# Patient Record
Sex: Female | Born: 1935 | Race: Black or African American | Hispanic: No | Smoking: Never smoker
Health system: Southern US, Community
[De-identification: ages and names within clinical notes are randomized; demographics above are authoritative.]

## PROBLEM LIST (undated history)

## (undated) DIAGNOSIS — K5792 Diverticulitis of intestine, part unspecified, without perforation or abscess without bleeding: Secondary | ICD-10-CM

## (undated) DIAGNOSIS — I7300 Raynaud's syndrome without gangrene: Secondary | ICD-10-CM

## (undated) DIAGNOSIS — R0902 Hypoxemia: Secondary | ICD-10-CM

## (undated) DIAGNOSIS — Z9981 Dependence on supplemental oxygen: Secondary | ICD-10-CM

## (undated) DIAGNOSIS — M40209 Unspecified kyphosis, site unspecified: Secondary | ICD-10-CM

## (undated) DIAGNOSIS — D649 Anemia, unspecified: Secondary | ICD-10-CM

## (undated) DIAGNOSIS — E785 Hyperlipidemia, unspecified: Secondary | ICD-10-CM

## (undated) DIAGNOSIS — M1990 Unspecified osteoarthritis, unspecified site: Secondary | ICD-10-CM

## (undated) DIAGNOSIS — K259 Gastric ulcer, unspecified as acute or chronic, without hemorrhage or perforation: Secondary | ICD-10-CM

## (undated) DIAGNOSIS — E119 Type 2 diabetes mellitus without complications: Secondary | ICD-10-CM

## (undated) DIAGNOSIS — K219 Gastro-esophageal reflux disease without esophagitis: Secondary | ICD-10-CM

## (undated) DIAGNOSIS — I10 Essential (primary) hypertension: Secondary | ICD-10-CM

## (undated) DIAGNOSIS — C801 Malignant (primary) neoplasm, unspecified: Secondary | ICD-10-CM

## (undated) DIAGNOSIS — I2510 Atherosclerotic heart disease of native coronary artery without angina pectoris: Secondary | ICD-10-CM

## (undated) DIAGNOSIS — J45909 Unspecified asthma, uncomplicated: Secondary | ICD-10-CM

## (undated) DIAGNOSIS — I059 Rheumatic mitral valve disease, unspecified: Secondary | ICD-10-CM

## (undated) DIAGNOSIS — M810 Age-related osteoporosis without current pathological fracture: Secondary | ICD-10-CM

## (undated) DIAGNOSIS — D696 Thrombocytopenia, unspecified: Secondary | ICD-10-CM

## (undated) HISTORY — PX: HERNIA REPAIR: SHX51

## (undated) HISTORY — PX: BREAST SURGERY: SHX581

---

## 1976-10-06 HISTORY — PX: MASTECTOMY: SHX3

## 2008-10-19 ENCOUNTER — Inpatient Hospital Stay (HOSPITAL_COMMUNITY): Admission: EM | Admit: 2008-10-19 | Discharge: 2008-10-23 | Payer: Self-pay | Admitting: Emergency Medicine

## 2008-12-13 ENCOUNTER — Inpatient Hospital Stay (HOSPITAL_COMMUNITY): Admission: EM | Admit: 2008-12-13 | Discharge: 2008-12-16 | Payer: Self-pay | Admitting: Emergency Medicine

## 2009-11-22 ENCOUNTER — Inpatient Hospital Stay (HOSPITAL_COMMUNITY): Admission: EM | Admit: 2009-11-22 | Discharge: 2009-11-25 | Payer: Self-pay | Admitting: Emergency Medicine

## 2010-12-17 ENCOUNTER — Emergency Department (HOSPITAL_COMMUNITY): Payer: Medicare (Managed Care)

## 2010-12-17 ENCOUNTER — Emergency Department (HOSPITAL_COMMUNITY)
Admission: EM | Admit: 2010-12-17 | Discharge: 2010-12-17 | Disposition: A | Discharge status: Home / Self Care | Payer: Medicare (Managed Care) | Attending: Emergency Medicine | Admitting: Emergency Medicine

## 2010-12-17 DIAGNOSIS — M129 Arthropathy, unspecified: Secondary | ICD-10-CM | POA: Insufficient documentation

## 2010-12-17 DIAGNOSIS — I10 Essential (primary) hypertension: Secondary | ICD-10-CM | POA: Insufficient documentation

## 2010-12-17 DIAGNOSIS — R059 Cough, unspecified: Secondary | ICD-10-CM | POA: Insufficient documentation

## 2010-12-17 DIAGNOSIS — J45901 Unspecified asthma with (acute) exacerbation: Secondary | ICD-10-CM | POA: Insufficient documentation

## 2010-12-17 DIAGNOSIS — Z79899 Other long term (current) drug therapy: Secondary | ICD-10-CM | POA: Insufficient documentation

## 2010-12-17 DIAGNOSIS — R05 Cough: Secondary | ICD-10-CM | POA: Insufficient documentation

## 2010-12-17 LAB — BASIC METABOLIC PANEL
CO2: 28 mEq/L (ref 19–32)
Calcium: 8.8 mg/dL (ref 8.4–10.5)
Creatinine, Ser: 0.83 mg/dL (ref 0.4–1.2)
Glucose, Bld: 93 mg/dL (ref 70–99)
Sodium: 137 mEq/L (ref 135–145)

## 2010-12-17 LAB — CBC
HCT: 37.7 % (ref 36.0–46.0)
Hemoglobin: 11.6 g/dL — ABNORMAL LOW (ref 12.0–15.0)
MCH: 28.2 pg (ref 26.0–34.0)
RDW: 14.3 % (ref 11.5–15.5)

## 2010-12-17 LAB — DIFFERENTIAL
Basophils Absolute: 0 10*3/uL (ref 0.0–0.1)
Eosinophils Relative: 1 % (ref 0–5)
Lymphocytes Relative: 15 % (ref 12–46)
Lymphs Abs: 1.7 10*3/uL (ref 0.7–4.0)
Neutro Abs: 8.7 10*3/uL — ABNORMAL HIGH (ref 1.7–7.7)

## 2010-12-25 LAB — DIFFERENTIAL
Basophils Absolute: 0 10*3/uL (ref 0.0–0.1)
Basophils Relative: 0 % (ref 0–1)
Basophils Relative: 0 % (ref 0–1)
Basophils Relative: 0 % (ref 0–1)
Eosinophils Absolute: 0 10*3/uL (ref 0.0–0.7)
Eosinophils Relative: 0 % (ref 0–5)
Eosinophils Relative: 0 % (ref 0–5)
Eosinophils Relative: 1 % (ref 0–5)
Lymphocytes Relative: 17 % (ref 12–46)
Lymphs Abs: 0.9 10*3/uL (ref 0.7–4.0)
Monocytes Absolute: 0.2 10*3/uL (ref 0.1–1.0)
Monocytes Relative: 2 % — ABNORMAL LOW (ref 3–12)
Neutro Abs: 11.2 10*3/uL — ABNORMAL HIGH (ref 1.7–7.7)
Neutrophils Relative %: 91 % — ABNORMAL HIGH (ref 43–77)

## 2010-12-25 LAB — CBC
HCT: 31.3 % — ABNORMAL LOW (ref 36.0–46.0)
Hemoglobin: 10.6 g/dL — ABNORMAL LOW (ref 12.0–15.0)
MCHC: 33 g/dL (ref 30.0–36.0)
MCHC: 33.9 g/dL (ref 30.0–36.0)
MCV: 90.8 fL (ref 78.0–100.0)
Platelets: 129 10*3/uL — ABNORMAL LOW (ref 150–400)
RBC: 3.44 MIL/uL — ABNORMAL LOW (ref 3.87–5.11)
RDW: 14.4 % (ref 11.5–15.5)
WBC: 12.4 10*3/uL — ABNORMAL HIGH (ref 4.0–10.5)
WBC: 7.5 10*3/uL (ref 4.0–10.5)

## 2010-12-25 LAB — COMPREHENSIVE METABOLIC PANEL
AST: 20 U/L (ref 0–37)
Albumin: 2.4 g/dL — ABNORMAL LOW (ref 3.5–5.2)
CO2: 25 mEq/L (ref 19–32)
Chloride: 106 mEq/L (ref 96–112)
Creatinine, Ser: 0.72 mg/dL (ref 0.4–1.2)
Glucose, Bld: 158 mg/dL — ABNORMAL HIGH (ref 70–99)
Potassium: 3.5 mEq/L (ref 3.5–5.1)
Total Protein: 5.2 g/dL — ABNORMAL LOW (ref 6.0–8.3)

## 2010-12-25 LAB — BASIC METABOLIC PANEL
BUN: 8 mg/dL (ref 6–23)
GFR calc Af Amer: >60 mL/min (ref >60)
GFR calc non Af Amer: >60 mL/min (ref >60)
Glucose, Bld: 133 mg/dL — ABNORMAL HIGH (ref 70–99)
Potassium: 4 mEq/L (ref 3.5–5.1)
Sodium: 135 mEq/L (ref 135–145)

## 2010-12-25 LAB — HEMOGLOBIN A1C: Hgb A1c MFr Bld: 6.4 % — ABNORMAL HIGH (ref 4.6–6.1)

## 2011-01-16 LAB — CBC
HCT: 29 % — ABNORMAL LOW (ref 36.0–46.0)
HCT: 29 % — ABNORMAL LOW (ref 36.0–46.0)
HCT: 29.6 % — ABNORMAL LOW (ref 36.0–46.0)
HCT: 35.5 % — ABNORMAL LOW (ref 36.0–46.0)
Hemoglobin: 9.6 g/dL — ABNORMAL LOW (ref 12.0–15.0)
Hemoglobin: 9.8 g/dL — ABNORMAL LOW (ref 12.0–15.0)
Hemoglobin: 9.8 g/dL — ABNORMAL LOW (ref 12.0–15.0)
MCHC: 32.8 g/dL (ref 30.0–36.0)
MCHC: 33.2 g/dL (ref 30.0–36.0)
MCHC: 33.6 g/dL (ref 30.0–36.0)
MCV: 92.7 fL (ref 78.0–100.0)
MCV: 93.1 fL (ref 78.0–100.0)
Platelets: 180 10*3/uL (ref 150–400)
RBC: 3.13 MIL/uL — ABNORMAL LOW (ref 3.87–5.11)
RBC: 3.82 MIL/uL — ABNORMAL LOW (ref 3.87–5.11)
RDW: 13.4 % (ref 11.5–15.5)
RDW: 13.4 % (ref 11.5–15.5)
RDW: 13.8 % (ref 11.5–15.5)
WBC: 8.4 10*3/uL (ref 4.0–10.5)

## 2011-01-16 LAB — COMPREHENSIVE METABOLIC PANEL
Albumin: 3.1 g/dL — ABNORMAL LOW (ref 3.5–5.2)
Calcium: 8.8 mg/dL (ref 8.4–10.5)
Chloride: 102 mEq/L (ref 96–112)
GFR calc non Af Amer: >60 mL/min (ref >60)
Potassium: 4.1 mEq/L (ref 3.5–5.1)
Total Bilirubin: 0.5 mg/dL (ref 0.3–1.2)

## 2011-01-16 LAB — BASIC METABOLIC PANEL
BUN: 9 mg/dL (ref 6–23)
CO2: 28 mEq/L (ref 19–32)
CO2: 30 mEq/L (ref 19–32)
Calcium: 8 mg/dL — ABNORMAL LOW (ref 8.4–10.5)
Calcium: 8.4 mg/dL (ref 8.4–10.5)
Chloride: 110 mEq/L (ref 96–112)
Creatinine, Ser: 0.53 mg/dL (ref 0.4–1.2)
GFR calc Af Amer: >60 mL/min (ref >60)
Glucose, Bld: 102 mg/dL — ABNORMAL HIGH (ref 70–99)
Glucose, Bld: 118 mg/dL — ABNORMAL HIGH (ref 70–99)
Potassium: 3.4 mEq/L — ABNORMAL LOW (ref 3.5–5.1)
Potassium: 4.1 mEq/L (ref 3.5–5.1)
Sodium: 140 mEq/L (ref 135–145)
Sodium: 141 mEq/L (ref 135–145)
Sodium: 142 mEq/L (ref 135–145)

## 2011-01-16 LAB — DIFFERENTIAL
Basophils Absolute: 0 10*3/uL (ref 0.0–0.1)
Basophils Absolute: 0 10*3/uL (ref 0.0–0.1)
Basophils Absolute: 0 10*3/uL (ref 0.0–0.1)
Basophils Relative: 0 % (ref 0–1)
Basophils Relative: 0 % (ref 0–1)
Basophils Relative: 0 % (ref 0–1)
Eosinophils Absolute: 0 10*3/uL (ref 0.0–0.7)
Eosinophils Absolute: 0.2 10*3/uL (ref 0.0–0.7)
Eosinophils Relative: 1 % (ref 0–5)
Eosinophils Relative: 3 % (ref 0–5)
Lymphs Abs: 0.9 10*3/uL (ref 0.7–4.0)
Monocytes Absolute: 0.4 10*3/uL (ref 0.1–1.0)
Monocytes Absolute: 0.5 10*3/uL (ref 0.1–1.0)
Monocytes Absolute: 0.6 10*3/uL (ref 0.1–1.0)
Monocytes Relative: 5 % (ref 3–12)
Monocytes Relative: 6 % (ref 3–12)
Neutro Abs: 5.7 10*3/uL (ref 1.7–7.7)
Neutrophils Relative %: 80 % — ABNORMAL HIGH (ref 43–77)
Neutrophils Relative %: 88 % — ABNORMAL HIGH (ref 43–77)

## 2011-01-16 LAB — URINALYSIS, ROUTINE W REFLEX MICROSCOPIC
Glucose, UA: 250 mg/dL — AB
Ketones, ur: NEGATIVE mg/dL
Protein, ur: NEGATIVE mg/dL
Urobilinogen, UA: 0.2 mg/dL (ref 0.0–1.0)

## 2011-01-16 LAB — CULTURE, BLOOD (ROUTINE X 2)
Culture: NO GROWTH
Report Status: 3152010

## 2011-01-16 LAB — HEMOGLOBIN A1C: Hgb A1c MFr Bld: 5.9 % (ref 4.6–6.1)

## 2011-01-16 LAB — APTT: aPTT: 36 seconds (ref 24–37)

## 2011-01-16 LAB — URINE CULTURE: Culture: NO GROWTH

## 2011-01-20 LAB — DIFFERENTIAL
Basophils Absolute: 0 10*3/uL (ref 0.0–0.1)
Basophils Relative: 0 % (ref 0–1)
Basophils Relative: 0 % (ref 0–1)
Eosinophils Absolute: 0 10*3/uL (ref 0.0–0.7)
Eosinophils Absolute: 0.1 10*3/uL (ref 0.0–0.7)
Eosinophils Relative: 1 % (ref 0–5)
Lymphocytes Relative: 7 % — ABNORMAL LOW (ref 12–46)
Lymphocytes Relative: 12 % (ref 12–46)
Lymphs Abs: 0.5 10*3/uL — ABNORMAL LOW (ref 0.7–4.0)
Lymphs Abs: 1.1 10*3/uL (ref 0.7–4.0)
Monocytes Absolute: 0.6 10*3/uL (ref 0.1–1.0)
Monocytes Absolute: 0.4 10*3/uL (ref 0.1–1.0)
Monocytes Relative: 3 % (ref 3–12)
Monocytes Relative: 6 % (ref 3–12)
Monocytes Relative: 4 % (ref 3–12)
Neutro Abs: 8.3 10*3/uL — ABNORMAL HIGH (ref 1.7–7.7)
Neutro Abs: 7.3 10*3/uL (ref 1.7–7.7)
Neutrophils Relative %: 90 % — ABNORMAL HIGH (ref 43–77)
Neutrophils Relative %: 81 % — ABNORMAL HIGH (ref 43–77)

## 2011-01-20 LAB — URINALYSIS, ROUTINE W REFLEX MICROSCOPIC
Bilirubin Urine: NEGATIVE
Ketones, ur: NEGATIVE mg/dL
Nitrite: NEGATIVE
Protein, ur: NEGATIVE mg/dL
Urobilinogen, UA: 0.2 mg/dL (ref 0.0–1.0)

## 2011-01-20 LAB — CBC
HCT: 29.4 % — ABNORMAL LOW (ref 36.0–46.0)
Hemoglobin: 9.5 g/dL — ABNORMAL LOW (ref 12.0–15.0)
Hemoglobin: 9.9 g/dL — ABNORMAL LOW (ref 12.0–15.0)
MCHC: 32.5 g/dL (ref 30.0–36.0)
MCHC: 32.5 g/dL (ref 30.0–36.0)
MCHC: 32.2 g/dL (ref 30.0–36.0)
MCV: 91.3 fL (ref 78.0–100.0)
RBC: 3.33 MIL/uL — ABNORMAL LOW (ref 3.87–5.11)
RDW: 14.2 % (ref 11.5–15.5)
RDW: 14.3 % (ref 11.5–15.5)
WBC: 8.9 10*3/uL (ref 4.0–10.5)

## 2011-01-20 LAB — BASIC METABOLIC PANEL
BUN: 9 mg/dL (ref 6–23)
CO2: 27 mEq/L (ref 19–32)
Calcium: 7.9 mg/dL — ABNORMAL LOW (ref 8.4–10.5)
Calcium: 8 mg/dL — ABNORMAL LOW (ref 8.4–10.5)
Chloride: 106 mEq/L (ref 96–112)
Creatinine, Ser: 0.6 mg/dL (ref 0.4–1.2)
GFR calc non Af Amer: >60 mL/min (ref >60)
Glucose, Bld: 104 mg/dL — ABNORMAL HIGH (ref 70–99)
Glucose, Bld: 137 mg/dL — ABNORMAL HIGH (ref 70–99)
Sodium: 138 mEq/L (ref 135–145)

## 2011-01-20 LAB — COMPREHENSIVE METABOLIC PANEL
ALT: 35 U/L (ref 0–35)
AST: 44 U/L — ABNORMAL HIGH (ref 0–37)
Calcium: 7.9 mg/dL — ABNORMAL LOW (ref 8.4–10.5)
GFR calc Af Amer: >60 mL/min (ref >60)
Sodium: 135 mEq/L (ref 135–145)
Total Protein: 5.1 g/dL — ABNORMAL LOW (ref 6.0–8.3)

## 2011-02-18 NOTE — H&P (Signed)
[name], [name]               ACCOUNT NO.:  [id number]   MEDICAL RECORD NO.:  [id number]          PATIENT TYPE:  INP   LOCATION:  A304                          FACILITY:  [facility]   PHYSICIAN:  Dorris Singh, DO    DATE OF BIRTH:  03/29/1927   DATE OF ADMISSION:  12/13/2008  DATE OF DISCHARGE:  LH                              HISTORY & PHYSICAL   PRIORITY ADMISSION HISTORY AND PHYSICAL   CHIEF COMPLAINT:  Fever.   Patient is a 77 year old, African American female, whose primary care  doctor is Dr. Arlyce Dice, who had presented at the emergency room with a  chief complaint of the above.  She currently lives with her daughter,  and she says she started feeling bad about 3 days ago with a productive  cough with white mucus and soreness in her chest.  Her fever, there is  no T-max noted, started today.  The daughter had noticed that she had  started to experience decreased ambulation and became too weak to use  her walker today.  She has oxygen at home and uses it p.r.n. and had to  use it last night.  Patient had pneumonia in January and was admitted  here for 3 days.  She improved.  The pattern of her cough and decline  has been persistent and has been worsening, the symptoms have been  described as moderate to severe, and her condition has been aggravated  by nothing and has been relieved by nothing and has been associated with  dyspnea and wheezing, but it has not been associated with diarrhea,  nausea, or vomiting.   PAST MEDICAL HISTORY:  Significant for:  1. Asthma.  2. Hypertension.  3. Arthritis.   PAST SURGICAL HISTORY:  Mastectomy.   SOCIAL HISTORY:  She currently is a nonsmoker and nondrinker, no drug  abuse, and lives with her daughter and does use home oxygen and a walker  at home.   ALLERGIES:  ASPIRIN; she just cannot take noncoated aspirin, she can  take coated.   HOME MEDICATIONS:  Include:  1. Evista 60 mg once a day.  2. Aspirin, coated, 81 mg once a  day.  3. Tramadol/acetaminophen 50 mg q.6 as needed.  4. Metoprolol Tartrate 25 mg twice a day, which she is not taking      currently.  5. Simvastatin 10 mg at bedtime.  6. Cozaar 80 mg once a day.  7. Vitamin D.   REVIEW OF SYSTEMS:  CONSTITUTIONAL:  Positive for changes in appetite  and fever.  HEAD:  Negative for any changes.  HEART:  Negative.  RESPIRATORY:  Positive cough and shortness of breath.  GI:  All  negative.  GU:  All negative.  MUSCULOSKELETAL:  Positive for severe  arthritis.   PHYSICAL EXAMINATION:  VITAL SIGNS:  Blood pressure is 131/85, pulse  rate 120, respirations 18, temperature in the emergency room 100.7.  Her  current temperatures at the hospital in Patient Admissions:  Temperature  100.1, pulse 108, respirations 20, blood pressure 96/47.  GENERAL:  The patient is a 77 year old, African American female, who  is  seen with her neck bent to the left due to severe arthritis.  She is  alert and oriented and can hear well.  HEAD:  Normocephalic, atraumatic.  EYES:  PERRLA, EOMI.  NECK:  Severe arthritis that puts her at a side bent position to the  left.  CHEST:  Symmetrical, there are no retractions with respirations.  Chest  has positive shallow respirations and decreased air movement.  HEART:  Regular rate and rhythm.  ABDOMEN:  Soft, nontender, and nondistended.  EXTREMITIES:  Positive pulses, no edema, ecchymosis, or cyanosis.  NEUROLOGIC:  Cranial nerves II-XII are grossly intact.  Motor is intact  and A+Ox3.  SKIN:  Good turgor and good texture.   LABORATORY RESULTS:  Her UA is negative except for her glucose being at  250.  Her chest x-ray shows bibasilar atelectasis, which could be due to  pneumonia.  Sodium is 139, potassium 4.1, chloride 106, carbon dioxide  31, glucose 106, BUN 10, and creatinine 0.71.  White count is 14.6,  hemoglobin 11.6, hematocrit 35.5, and platelet count is 187.   ASSESSMENT AND PLAN:  1. Bilateral pneumonia.  2.  Glycosuria.  3. Leukocytosis.  4. Severe arthritis.  5. Hypertension.  6. Asthma.   The plan will be to admit patient to the service of [facility].  We will  admit her to Team T.  We will have Respiratory follow her to keep her  SATs above 90 using a nonrebreather or BiPAP.  We will put her on a  heart healthy diet and increase as tolerated.  We will also put her on  cautious IV fluids.  We will get a hemoglobin A1c as well as basic blood  work in the morning.  We will do DVT and GI prophylaxis.  We will place  her on Rocephin 1 g IV q.24 and Zithromax 1 g IV q.24.  We will place  her on all of her home medications that are appropriate, and we will  continue to monitor her at this point in time.      Dorris Singh, DO  Electronically Signed     CB/MEDQ  D:  12/13/2008  T:  12/13/2008  Job:  [id number]

## 2011-02-18 NOTE — H&P (Signed)
[name], [name]               ACCOUNT NO.:  [id number]   MEDICAL RECORD NO.:  [id number]          PATIENT TYPE:  INP   LOCATION:  A338                          FACILITY:  [facility]   PHYSICIAN:  Margaretmary Dys, M.D.DATE OF BIRTH:  03/29/1927   DATE OF ADMISSION:  10/19/2008  DATE OF DISCHARGE:  LH                              HISTORY & PHYSICAL   ADMISSION DIAGNOSES:  1. Pneumonia.  2. Probable early sepsis with hypotension/early shock.  3. History of severe degenerative joint disease.  4. Fever likely related to pneumonia.  5. Left lower lobe pneumonia.   CHIEF COMPLAINT:  Fever of 103 degrees at home.   HISTORY OF PRESENT ILLNESS:  [name] is a 77 year old female who was  brought into the hospital today by family due to complaint of cough  productive of mostly yellowish sputum.  Patient also reports fever of  103 degrees at home.  Patient has what appears to be pleuritic chest  pain mostly on the right side.  She rates the pain as a 4/10, sharp,  mostly on the right side aggravated by cough.  No significant relief.  She denies any angina-like pain.  Patient has severe arthritis with  significant scoliosis.   Patient's husband actually was also sick and was also brought in to the  hospital.  The influenza titer was negative.  She denies any recent  weight loss.  She has no hemoptysis.  Evaluation in the emergency room  did reveal evidence of a left lower lobe pneumonia.  Chest x-ray shows  evidence of left lower lobe pneumonia.  Patient received IV antibiotics  and some fluid boluses with improvement in her blood pressure.   REVIEW OF SYSTEMS:  As mentioned in history of present illness above.   PAST MEDICAL HISTORY:  1. Hypertension.  2. Asthma.  3. Severe degenerative joint disease with severe arthritis and      deformities.   MEDICATIONS:  She is on:  1. Evista 60 mg p.o. once a day.  2. Aspirin 81 mg once a day.  3. Tramadol 50 mg p.o. every 6 hours.  4.  Metoprolol 25 mg p.o. b.i.d.  5. Simvastatin 10 mg at bedtime.  6. Cozaar 100 mg p.o. once a day.  7. Vitamin D 50,000 units with Centrum once a day.   ALLERGIES:  PATIENT HAS ALLERGY O ASPIRIN BUT CAN TAKE ENTERIC-COATED  ASPIRIN.   FAMILY HISTORY:  Noncontributory.   SOCIAL HISTORY:  Patient is married, lives with her husband who was also  sick and is currently under admission too for similar symptoms of cough,  shortness of breath.   She is a lifelong nonsmoker.  Does not drink alcohol.   PHYSICAL EXAM:  Patient was conscious, alert, comfortable, not in acute  distress, was well oriented in time, place, and person, and was very  pleasant.  Initial blood pressure in the emergency room was 91/46 with a  pulse of 92, respiration was 20, temperature was 99.7, oxygen saturation  was 94% on room air.  HEENT EXAM:  Normocephalic, atraumatic.  Oral mucosa was dry.  No  exudates were noted.  NECK:  Supple.  No JVD or lymphadenopathy.  Patient was deviated to the  left.  LUNGS:  Reduced air entry bilaterally with crackles, mostly at the left  base.  HEART:  S1 and S2, regular.  No S3, S4, gallops, or rubs.  ABDOMEN:  Soft, nontender.  Bowel sounds positive.  No masses palpable.  EXTREMITIES:  Patient has severe arthritis with duck neck deformity.  CNS EXAM:  Grossly intact with no focal neurological deficits.   LABORATORY/DIAGNOSTIC DATA:  White blood cell count was 8.1, hemoglobin  of 10.2, hematocrit is 31.5, platelet count was 159 with 90%  neutrophils.  Sodium 135, potassium 3.5, chloride of 101, CO2 was 26,  glucose 175, BUN was 15, creatinine was 0.83, AST is 44, ALT of 35,  total protein 5.1, albumin 2.5, calcium 7.9.  Urinalysis was negative.  Chest x-ray shows evidence of left lower lobe pneumonia.   ASSESSMENT AND PLAN:  This is a 77 year old female coming in to the  emergency room with cough, fever, left pleuritic chest pain.  Chest x-  ray confirms evidence of left lower  lobe pneumonia.  Patient is also  hypertensive, possibly dehydration but cannot rule out early sepsis.   PLAN:  1. Admit to the medical floor.  2. We will continue with fluid boluses, give a total of 3 L of normal      saline.  We will continue normal saline at the rate of 125 an hour.  3. We will continue IV antibiotics with Rocephin and Zithromax.  DVT      prophylaxis will be with Lovenox.  GI prophylaxis with Protonix.  4. We will continue all her home medications at this time.  5. We will keep her oxygen saturation greater than 92%.   CODE STATUS:  Full code at this time.   I have discussed the above plan with the grandson who was present at the  bedside.  Patient also verbalized full understanding of the plan above.      Margaretmary Dys, M.D.  Electronically Signed    AM/MEDQ  D:  10/20/2008  T:  10/20/2008  Job:  [id number]

## 2011-02-18 NOTE — Discharge Summary (Signed)
[name], [name] NO.:  [id number]   MEDICAL RECORD NO.:  [id number]          PATIENT TYPE:  INP   LOCATION:  A338                          FACILITY:  [facility]   PHYSICIAN:  Osvaldo Shipper, MD     DATE OF BIRTH:  03/29/1927   DATE OF ADMISSION:  10/19/2008  DATE OF DISCHARGE:  01/18/2010LH                               DISCHARGE SUMMARY   PRIMARY MEDICAL DOCTOR:  The patient's PMD is located in [place],  [place].   DISCHARGE DIAGNOSES:  1. Pneumonia likely community-acquired, improved.  2. Septic shock, resolved.  3. Severe rheumatoid arthritis.   Please see H&P dictated at the time of admission for details regarding  the patient's presenting illness.   BRIEF HOSPITAL COURSE:  Briefly, this is a 77 year old [place] American  female who lives at home with her husband and grandchildren. who was  brought into the hospital for complaints of fever at home.  She was  found to have a pneumonia.  She was started on antibiotics.  She was  also found to be in septic shock when she was admitted.  The patient was  given IV fluids.  She has improved significantly in the last few days.  She is saturating greater than 90% on room air.  She has been  complaining of ear pain.  She is already on antibiotics.  ENT evaluation  was ordered; however, there is no ENT doctor who comes to this hospital.  Her pain is not that severe and she has an ENT doctor in [place]  with  whom she follows with.  So, I have explained the above to the patient; I  have told her to call her ENT doctor to make an appointment as soon as  possible.   She is awaiting PT evaluation after which she can be discharged.  On the  day of discharge, the patient is feeling well.  No complaints are  offered except for the ear pain.  She is still coughing up some greenish  sputum with blood tinge to them; however, she is overall feeling better.  Temperature is 99.2 today and blood pressure is normal at140/74,  saturation is 95% on room air.   The lungs reveal a few crackles at the bases bilaterally, but mostly  clear to auscultation.   So overall I think she is stable for discharge.  She can continue  antibiotics at home for the next few days.   DISCHARGE MEDICATIONS:  1. Levaquin 5 mg daily for 5 days.  2. Metoprolol 50 mg half tablet b.i.d.  3. Simvastatin 20 mg half tablet at bedtime.  4. Vitamin D monthly.  5. Tramadol as needed.  6. Cozaar 100 mg in the morning.  7. Evista 60 mg daily.  8. Bayer aspirin 81 mg daily.  9. Centrum multivitamin 1 tablet daily.   FOLLOW UP:  With her PMD in 1 week.   DIET/PHYSICAL ACTIVITY:  As before; she was requiring a walker at home.   TOTAL TIME ON DISCHARGE ENCOUNTER:  Is 35 minutes.      Osvaldo Shipper, MD  Electronically Signed  GK/MEDQ  D:  10/23/2008  T:  10/23/2008  Job:  [id number]

## 2011-02-18 NOTE — Group Therapy Note (Signed)
[name], [name]               ACCOUNT NO.:  [id number]   MEDICAL RECORD NO.:  [id number]          PATIENT TYPE:  INP   LOCATION:  A338                          FACILITY:  [facility]   PHYSICIAN:  Margaretmary Dys, M.D.DATE OF BIRTH:  03/29/1927   DATE OF PROCEDURE:  10/22/2008  DATE OF DISCHARGE:                                 PROGRESS NOTE   SUBJECTIVE:  The patient is still coughing up some thick sputum,  yellowish to occasional streaks of blood.  The patient feels rather  concerned about this cough, as it appears to tire her out easily.   Sputum cultures have been ordered.  Otherwise, she denies any fevers.   OBJECTIVE:  GENERAL:  Conscious, alert, comfortable, not in acute  distress.  VITAL SIGNS:  Blood pressure is 144/78, pulse of 97, respirations 22,  temperature 98.4 degrees Fahrenheit.  Oxygen saturation was 96% on room  air.  HEENT:  Normocephalic, atraumatic.  Oral mucus is moist.  No exudates  were noted.  NECK:  Supple.  No JVD or lymphadenopathy.  LUNGS:  Reduced air entry bilaterally with crackles in the left base.  HEART:  S1-S2.  No S3, S4, gallops or rubs.  ABDOMEN:  Soft, nontender.  Bowel sounds positive.  EXTREMITIES:  No edema.  The patient has evidence of degenerative joint  disease with arthritis.  CNS:  Grossly intact with no focal neurological deficits.   LABORATORY/DIAGNOSTIC DATA:  Sputum cultures ordered.  Gram stain is  still pending.  Urinalysis was negative.   ASSESSMENT/PLAN:  1. Left lower lobe pneumonia.  2. Fever related to pneumonia.  3. Septic shock, resolved.  Patient is on an anti-hypertensive.  4. History of severe degenerative joint disease.   PLAN:  1. The patient's fever has resolved.  2. White blood cell count has remained stable.  3. Will continue IV antibiotics of Rocephin and Zithromax.  The      patient continues to have a fair amount of cough productive of      greenish sputum.  4. Continue on DVT prophylaxis with  Lovenox and GI prophylaxis with      Protonix.  5. Will continue on her home medications.   DISPOSITION:  The patient will be monitored and I think may be able to  go home in the next 24-48 hours on oral antibiotic therapy.  Will also  continue her nebulizers.  Hopefully, she can continue to expectorate  more mucus easily.      Margaretmary Dys, M.D.  Electronically Signed     AM/MEDQ  D:  10/22/2008  T:  10/22/2008  Job:  [id number]

## 2011-02-18 NOTE — Group Therapy Note (Signed)
NAMEMELISHA, [name]               ACCOUNT NO.:  [id number]   MEDICAL RECORD NO.:  [id number]          PATIENT TYPE:  INP   LOCATION:  A338                          FACILITY:  [facility]   PHYSICIAN:  Margaretmary Dys, M.D.DATE OF BIRTH:  03/29/1927   DATE OF PROCEDURE:  10/21/2008  DATE OF DISCHARGE:                                 PROGRESS NOTE   SUBJECTIVE:  The patient feels much better, although she is coughing up  some yellowish sputum now with a little tinge of blood.  She denies any  significant chest pain.  No headaches or fevers.   VITAL SIGNS:  Blood pressure is 132/68, pulse is 112, respiration is 18,  temperature 98.2 degrees Fahrenheit.  Oxygen saturation was 95% on room  air.  HEENT EXAM:  Normocephalic, atraumatic.  Oral mucosa was moist.  NECK:  Supple.  No JVD or lymphadenopathy.  LUNGS:  Crackles mostly in the left base.  HEART:  S1-S2 tachycardiac.  No S3, gallops or rubs.  ABDOMEN:  Was soft, nontender.  Bowel sounds positive.  No mass  palpable.  EXTREMITIES:  No edema.  The patient continues to have significant  degenerative arthritis.  CENTRAL NERVOUS SYSTEM EXAM:  Was grossly intact.   LABORATORY/DIAGNOSTIC DATA:  White blood cell count is 8.9, hemoglobin  9.9, hematocrit 30.6, platelet count was 194 with 81% neutrophils.  Sodium 139, potassium was 4.1, chloride of 111, CO2 was 26, glucose 137,  BUN of 9, creatinine was 0.6, calcium is 8.   ASSESSMENT/PLAN:  1. Left lower lobe pneumonia.  2. Fever related to pneumonia.  3. Sepsis with shock.  4. History of severe degenerative joint disease.   PLAN:  1. The patient's fever has resolved.  2. The patient's white blood cell count has also improved.  3. Will continue on IV antibiotics with Rocephin and Zithromax.  4. Continue on DVT prophylaxis with Lovenox and GI prophylaxis with      Protonix.  Continue all of her home      medications.  5. The patient has some hemoptysis.  Likely this is probably  secondary      to some bronchitic inflammation.  Will monitor this over the next      24-48 hours.      Margaretmary Dys, M.D.  Electronically Signed     AM/MEDQ  D:  10/21/2008  T:  10/21/2008  Job:  [id number]

## 2011-02-18 NOTE — Discharge Summary (Signed)
NAMEREYNE, FALCONI               ACCOUNT NO.:  [id number]   MEDICAL RECORD NO.:  [id number]          PATIENT TYPE:  INP   LOCATION:  A304                          FACILITY:  [facility]   PHYSICIAN:  Dorris Singh, DO    DATE OF BIRTH:  03/29/1927   DATE OF ADMISSION:  12/13/2008  DATE OF DISCHARGE:  03/13/2010LH                               DISCHARGE SUMMARY   ADMISSION DIAGNOSES:  Include  1. Bilateral pneumonia.  2. Glycosuria.  3. Leukocytosis.  4. Severe arthritis.  5. Hypertension.  6. Asthma.   DISCHARGE DIAGNOSES:  1. Bilateral pneumonia resolving.  2. Glycosuria.  3. Leukocytosis.  4. Severe arthritis.  5. Hypertension.  6. Asthma.  7. Impaired fasting glucose.   Her primary care physician is Dr. Arlyce Dice.   Radiology testing includes a portable chest on December 13, 2008 which  demonstrated bibasilar airspace disease may be due to pneumonia. On  March 12 worsening bibasilar infiltrates and on March 13 improved  bibasilar airspace disease.   Her H and P was done by Dr. Elige Radon.  Please refer. To summarize, the  patient is a 77 year old female who presented with fever. It was found  that she had bilateral pneumonia.  She was admitted to the service of  [facility]. We placed her on O2 and started and IV antibiotics Rocephin  and Zithromax. For her glycosuria, went ahead and got a hemoglobin A1c  which was at 5.9. Will have her follow up with this with her primary  care physician.  The patient was started on antibiotic therapy.  She  continued to do well with the breathing treatments.  She had a chest x-  ray on December 15, 2008 which showed that it was improving, and on March  13 it showed an improvement of the bilateral pneumonia.  We will go  ahead and discharge the patient to home on the following medications.   DISCHARGE MEDICATIONS:  1. Advair inhaler which she will do twice daily.  2. Cozaar 100 mg daily.  3. Evista 60 mg p.o. daily.  4. methylprednisone 4 mg  daily.  5. Simvastatin 20 mg daily.  6. Tramadol 50 mg daily.  7. Loratadine 10 mg as needed.  8. Aspirin 81 mg daily.  9. Vitamin D 50,000 units weekly.  10.Also she will go home on Zithromax Z-Pak use as directed.  11.Tessalon Perles 100 mg p.o. t.i.d. p.r.n. cough.   It is recommended that she follow up with Dr. Arlyce Dice within the next 1-7  days.  We encouraged fluids and rest and for her also to follow up and  to take her medications as directed. And asking her to follow up to be  evaluated for her glycosuria as well.  The patient's disposition will be  to home with her family where she lives and her condition is stable.      Dorris Singh, DO  Electronically Signed     CB/MEDQ  D:  12/16/2008  T:  12/16/2008  Job:  [id number]

## 2012-10-28 ENCOUNTER — Emergency Department (HOSPITAL_COMMUNITY): Payer: Medicare Other

## 2012-10-28 ENCOUNTER — Inpatient Hospital Stay (HOSPITAL_COMMUNITY)
Admission: EM | Admit: 2012-10-28 | Discharge: 2012-10-30 | DRG: 194 | Disposition: A | Discharge status: Home / Self Care | Payer: Medicare Other | Attending: Internal Medicine | Admitting: Internal Medicine

## 2012-10-28 ENCOUNTER — Encounter (HOSPITAL_COMMUNITY): Payer: Self-pay | Admitting: Emergency Medicine

## 2012-10-28 DIAGNOSIS — B349 Viral infection, unspecified: Secondary | ICD-10-CM

## 2012-10-28 DIAGNOSIS — D72829 Elevated white blood cell count, unspecified: Secondary | ICD-10-CM

## 2012-10-28 DIAGNOSIS — K219 Gastro-esophageal reflux disease without esophagitis: Secondary | ICD-10-CM

## 2012-10-28 DIAGNOSIS — J40 Bronchitis, not specified as acute or chronic: Secondary | ICD-10-CM | POA: Diagnosis present

## 2012-10-28 DIAGNOSIS — K5900 Constipation, unspecified: Secondary | ICD-10-CM | POA: Diagnosis present

## 2012-10-28 DIAGNOSIS — J111 Influenza due to unidentified influenza virus with other respiratory manifestations: Secondary | ICD-10-CM

## 2012-10-28 DIAGNOSIS — I10 Essential (primary) hypertension: Secondary | ICD-10-CM | POA: Diagnosis present

## 2012-10-28 DIAGNOSIS — M069 Rheumatoid arthritis, unspecified: Secondary | ICD-10-CM | POA: Diagnosis present

## 2012-10-28 DIAGNOSIS — Z7982 Long term (current) use of aspirin: Secondary | ICD-10-CM

## 2012-10-28 DIAGNOSIS — J189 Pneumonia, unspecified organism: Principal | ICD-10-CM

## 2012-10-28 DIAGNOSIS — IMO0002 Reserved for concepts with insufficient information to code with codable children: Secondary | ICD-10-CM

## 2012-10-28 DIAGNOSIS — B9789 Other viral agents as the cause of diseases classified elsewhere: Secondary | ICD-10-CM

## 2012-10-28 DIAGNOSIS — J45909 Unspecified asthma, uncomplicated: Secondary | ICD-10-CM | POA: Diagnosis present

## 2012-10-28 DIAGNOSIS — N390 Urinary tract infection, site not specified: Secondary | ICD-10-CM

## 2012-10-28 DIAGNOSIS — E119 Type 2 diabetes mellitus without complications: Secondary | ICD-10-CM

## 2012-10-28 HISTORY — DX: Unspecified asthma, uncomplicated: J45.909

## 2012-10-28 HISTORY — DX: Essential (primary) hypertension: I10

## 2012-10-28 HISTORY — DX: Gastro-esophageal reflux disease without esophagitis: K21.9

## 2012-10-28 HISTORY — DX: Type 2 diabetes mellitus without complications: E11.9

## 2012-10-28 LAB — BASIC METABOLIC PANEL
BUN: 13 mg/dL (ref 6–23)
Creatinine, Ser: 0.73 mg/dL (ref 0.50–1.10)
GFR calc Af Amer: 89 mL/min — ABNORMAL LOW (ref >90)
GFR calc non Af Amer: 77 mL/min — ABNORMAL LOW (ref >90)
Glucose, Bld: 190 mg/dL — ABNORMAL HIGH (ref 70–99)
Potassium: 4.2 mEq/L (ref 3.5–5.1)

## 2012-10-28 LAB — CBC WITH DIFFERENTIAL/PLATELET
Basophils Relative: 0 % (ref 0–1)
Eosinophils Absolute: 0.1 10*3/uL (ref 0.0–0.7)
Eosinophils Relative: 0 % (ref 0–5)
HCT: 35.9 % — ABNORMAL LOW (ref 36.0–46.0)
Hemoglobin: 11.5 g/dL — ABNORMAL LOW (ref 12.0–15.0)
Lymphs Abs: 2.2 10*3/uL (ref 0.7–4.0)
MCH: 29.2 pg (ref 26.0–34.0)
MCHC: 32 g/dL (ref 30.0–36.0)
MCV: 91.1 fL (ref 78.0–100.0)
Monocytes Absolute: 1.2 10*3/uL — ABNORMAL HIGH (ref 0.1–1.0)
Monocytes Relative: 7 % (ref 3–12)
RBC: 3.94 MIL/uL (ref 3.87–5.11)

## 2012-10-28 MED ORDER — SODIUM CHLORIDE 0.9 % IV BOLUS (SEPSIS)
1000.0000 mL | Freq: Once | INTRAVENOUS | Status: AC
  Administered 2012-10-28: 1000 mL via INTRAVENOUS

## 2012-10-28 MED ORDER — SODIUM CHLORIDE 0.9 % IV SOLN
Freq: Once | INTRAVENOUS | Status: AC
  Administered 2012-10-29: 1000 mL via INTRAVENOUS

## 2012-10-28 NOTE — ED Notes (Signed)
Pt's daughter states pt has had a cold since Tuesday with fever. Pt sent over from [facility] in [place]. Pt daughter states bp was low at their facility.

## 2012-10-28 NOTE — ED Provider Notes (Signed)
History     CSN: [id number]  Arrival date & time 10/28/12  2028   First MD Initiated Contact with Patient 10/28/12 2043      Chief Complaint  Patient presents with  . Hypotension  . Fever    (Consider location/radiation/quality/duration/timing/severity/associated sxs/prior treatment) Patient is a 79 y.o. female presenting with fever. The history is provided by the patient and a relative.  Fever Primary symptoms of the febrile illness include fever.  She has been sick for the last 3-4 days with cough productive of some thick white sputum and some mild generalized myalgias. She denies sore throat or dyspnea. There's been no nausea vomiting. There has been no change in chronic constipation. She started running a fever 2 days ago which was as high as 100.3. Appetite has been normal. She denies any sick contacts and she has received both her pneumonia and influenza vaccinations this season. She went to an urgent care center in [place] because she will was not improving and she was found to have low blood pressure and low oxygen saturations and was sent to the ED. They brought papers from the urgent care Center and blood pressure was 98 systolic and oxygen saturation 92% on room air. Of note, she has long-standing rheumatoid arthritis.  Past Medical History  Diagnosis Date  . Asthma   . Hypertension   . Diabetes mellitus without complication   . Acid reflux     Past Surgical History  Procedure Date  . Hernia repair   . Breast surgery     History reviewed. No pertinent family history.  History  Substance Use Topics  . Smoking status: Not on file  . Smokeless tobacco: Not on file  . Alcohol Use: No    OB History    Grav Para Term Preterm Abortions TAB SAB Ect Mult Living                  Review of Systems  Constitutional: Positive for fever.  All other systems reviewed and are negative.    Allergies  Aspirin  Home Medications  No current outpatient prescriptions  on file.  BP 106/47  Pulse 89  Temp 98.8 F (37.1 C) (Oral)  Resp 20  Ht 5\' 3"  (1.6 m)  Wt 109 lb (49.442 kg)  BMI 19.31 kg/m2  SpO2 94%  Physical Exam  Nursing note and vitals reviewed.  79 year old female, resting comfortably and in no acute distress. Vital signs are normal. Oxygen saturation is 94%, which is normal. Head is normocephalic and atraumatic. PERRLA, EOMI. Oropharynx is clear. Neck is nontender and supple without adenopathy or JVD. She holds her head tilted to the left. Back is nontender and there is no CVA tenderness. Lungs are clear without rales, wheezes, or rhonchi. Chest is nontender. Heart has regular rate and rhythm without murmur. Abdomen is soft, flat, nontender without masses or hepatosplenomegaly and peristalsis is normoactive. Extremities have no cyanosis or edema. Fingers have swan neck deformity consistent with long-standing rheumatoid arthritis. Skin is warm and dry without rash. Neurologic: Mental status is normal, cranial nerves are intact, there are no motor or sensory deficits.  ED Course  Procedures (including critical care time)  Results for orders placed during the hospital encounter of 10/28/12  CBC WITH DIFFERENTIAL      Component Value Range   WBC 18.7 (*) 4.0 - 10.5 K/uL   RBC 3.94  3.87 - 5.11 MIL/uL   Hemoglobin 11.5 (*) 12.0 - 15.0 g/dL  HCT 35.9 (*) 36.0 - 46.0 %   MCV 91.1  78.0 - 100.0 fL   MCH 29.2  26.0 - 34.0 pg   MCHC 32.0  30.0 - 36.0 g/dL   RDW 60.4  54.0 - 98.1 %   Platelets 187  150 - 400 K/uL   Neutrophils Relative 81 (*) 43 - 77 %   Neutro Abs 15.2 (*) 1.7 - 7.7 K/uL   Lymphocytes Relative 12  12 - 46 %   Lymphs Abs 2.2  0.7 - 4.0 K/uL   Monocytes Relative 7  3 - 12 %   Monocytes Absolute 1.2 (*) 0.1 - 1.0 K/uL   Eosinophils Relative 0  0 - 5 %   Eosinophils Absolute 0.1  0.0 - 0.7 K/uL   Basophils Relative 0  0 - 1 %   Basophils Absolute 0.0  0.0 - 0.1 K/uL  BASIC METABOLIC PANEL      Component Value Range     Sodium 134 (*) 135 - 145 mEq/L   Potassium 4.2  3.5 - 5.1 mEq/L   Chloride 98  96 - 112 mEq/L   CO2 29  19 - 32 mEq/L   Glucose, Bld 190 (*) 70 - 99 mg/dL   BUN 13  6 - 23 mg/dL   Creatinine, Ser 1.91  0.50 - 1.10 mg/dL   Calcium 8.9  8.4 - 47.8 mg/dL   GFR calc non Af Amer 77 (*) >90 mL/min   GFR calc Af Amer 89 (*) >90 mL/min  URINALYSIS, ROUTINE W REFLEX MICROSCOPIC      Component Value Range   Color, Urine YELLOW  YELLOW   APPearance CLOUDY (*) CLEAR   Specific Gravity, Urine 1.015  1.005 - 1.030   pH 6.0  5.0 - 8.0   Glucose, UA >1000 (*) NEGATIVE mg/dL   Hgb urine dipstick SMALL (*) NEGATIVE   Bilirubin Urine NEGATIVE  NEGATIVE   Ketones, ur NEGATIVE  NEGATIVE mg/dL   Protein, ur NEGATIVE  NEGATIVE mg/dL   Urobilinogen, UA 0.2  0.0 - 1.0 mg/dL   Nitrite NEGATIVE  NEGATIVE   Leukocytes, UA MODERATE (*) NEGATIVE  URINE MICROSCOPIC-ADD ON      Component Value Range   Squamous Epithelial / LPF MANY (*) RARE   WBC, UA TOO NUMEROUS TO COUNT  <3 WBC/hpf   RBC / HPF 3-6  <3 RBC/hpf   Bacteria, UA MANY (*) RARE   Dg Chest Portable 1 View  10/28/2012  *RADIOLOGY REPORT*  Clinical Data: Hypotension, weakness and fever.  PORTABLE CHEST - 1 VIEW  Comparison: Chest radiograph performed 12/17/2010  Findings: The lungs are mildly hypoexpanded.  There is mild elevation of the left hemidiaphragm.  Mild basilar airspace opacities are thought to reflect atelectasis, though pneumonia could conceivably have a similar appearance.  Pulmonary vascularity is at the upper limits of normal, with some vascular crowding seen.  No pleural effusion or pneumothorax is seen.  The left lung apex is partially obscured by the patient's head.  The cardiomediastinal silhouette is normal in size; calcification is noted in the aortic arch.  No acute osseous abnormalities are seen.  IMPRESSION: Lungs mildly hypoexpanded, with mild elevation of the left hemidiaphragm.  Mild bibasilar airspace opacities are thought to  reflect atelectasis, though pneumonia could conceivably have a similar appearance.  Lungs otherwise grossly clear.   Original Report Authenticated By: Tonia Ghent, M.D.       1. Urinary tract infection   2. Influenza   3.  Leukocytosis       MDM  Probable influenza. I reviewed her prior records and she does have a hospitalization for community-acquired pneumonia. From what I could gather from prior records, her blood pressure is normally in the range of 100-110. I do not feel her blood pressure of 98 at the urgent care Center actually represented true hypotension. She'll be given IV fluids here and chest x-ray and laboratory workup obtained.  She feels considerably better after IV fluids. Chest x-ray is unremarkable but WBC is markedly elevated with a left shift and urinalysis obtained via catheter has many bacteria and too numerous to count WBCs. She will need to be treated for urinary tract infection. Given her frail status and markedly elevated WBC, and treatment will need to be started as an inpatient. She's given a dose of ceftriaxone in the emergency department and case is discussed with Dr. Orvan Falconer [facility] who agrees to admit the patient.      Dione Booze, MD 10/29/12 [phone]

## 2012-10-29 ENCOUNTER — Encounter (HOSPITAL_COMMUNITY): Payer: Self-pay | Admitting: Internal Medicine

## 2012-10-29 DIAGNOSIS — J988 Other specified respiratory disorders: Secondary | ICD-10-CM | POA: Insufficient documentation

## 2012-10-29 DIAGNOSIS — E119 Type 2 diabetes mellitus without complications: Secondary | ICD-10-CM | POA: Diagnosis present

## 2012-10-29 DIAGNOSIS — J189 Pneumonia, unspecified organism: Principal | ICD-10-CM

## 2012-10-29 DIAGNOSIS — K219 Gastro-esophageal reflux disease without esophagitis: Secondary | ICD-10-CM | POA: Diagnosis present

## 2012-10-29 DIAGNOSIS — B349 Viral infection, unspecified: Secondary | ICD-10-CM | POA: Diagnosis present

## 2012-10-29 DIAGNOSIS — N390 Urinary tract infection, site not specified: Secondary | ICD-10-CM | POA: Diagnosis present

## 2012-10-29 DIAGNOSIS — M069 Rheumatoid arthritis, unspecified: Secondary | ICD-10-CM | POA: Diagnosis present

## 2012-10-29 DIAGNOSIS — I10 Essential (primary) hypertension: Secondary | ICD-10-CM | POA: Diagnosis present

## 2012-10-29 LAB — COMPREHENSIVE METABOLIC PANEL
Alkaline Phosphatase: 65 U/L (ref 39–117)
BUN: 13 mg/dL (ref 6–23)
Calcium: 8 mg/dL — ABNORMAL LOW (ref 8.4–10.5)
GFR calc Af Amer: 90 mL/min — ABNORMAL LOW (ref >90)
GFR calc non Af Amer: 77 mL/min — ABNORMAL LOW (ref >90)
Glucose, Bld: 223 mg/dL — ABNORMAL HIGH (ref 70–99)
Total Protein: 5.4 g/dL — ABNORMAL LOW (ref 6.0–8.3)

## 2012-10-29 LAB — URINALYSIS, ROUTINE W REFLEX MICROSCOPIC
Bilirubin Urine: NEGATIVE
Ketones, ur: NEGATIVE mg/dL
Nitrite: NEGATIVE
Specific Gravity, Urine: 1.015 (ref 1.005–1.030)
Urobilinogen, UA: 0.2 mg/dL (ref 0.0–1.0)

## 2012-10-29 LAB — URINE MICROSCOPIC-ADD ON

## 2012-10-29 LAB — CBC
HCT: 32.8 % — ABNORMAL LOW (ref 36.0–46.0)
Hemoglobin: 10.4 g/dL — ABNORMAL LOW (ref 12.0–15.0)
MCH: 29.3 pg (ref 26.0–34.0)
MCHC: 31.7 g/dL (ref 30.0–36.0)

## 2012-10-29 LAB — INFLUENZA PANEL BY PCR (TYPE A & B): H1N1 flu by pcr: NOT DETECTED

## 2012-10-29 LAB — GLUCOSE, CAPILLARY
Glucose-Capillary: 234 mg/dL — ABNORMAL HIGH (ref 70–99)
Glucose-Capillary: 211 mg/dL — ABNORMAL HIGH (ref 70–99)

## 2012-10-29 MED ORDER — RALOXIFENE HCL 60 MG PO TABS
60.0000 mg | ORAL_TABLET | Freq: Every morning | ORAL | Status: DC
  Administered 2012-10-29 – 2012-10-30 (×2): 60 mg via ORAL
  Filled 2012-10-29 (×2): qty 1

## 2012-10-29 MED ORDER — ENOXAPARIN SODIUM 30 MG/0.3ML ~~LOC~~ SOLN
30.0000 mg | SUBCUTANEOUS | Status: DC
  Administered 2012-10-29 – 2012-10-30 (×2): 30 mg via SUBCUTANEOUS
  Filled 2012-10-29 (×2): qty 0.3

## 2012-10-29 MED ORDER — MOMETASONE FURO-FORMOTEROL FUM 100-5 MCG/ACT IN AERO
2.0000 | INHALATION_SPRAY | Freq: Two times a day (BID) | RESPIRATORY_TRACT | Status: DC
  Administered 2012-10-29 – 2012-10-30 (×3): 2 via RESPIRATORY_TRACT
  Filled 2012-10-29: qty 8.8

## 2012-10-29 MED ORDER — BENZONATATE 100 MG PO CAPS
200.0000 mg | ORAL_CAPSULE | Freq: Three times a day (TID) | ORAL | Status: DC
  Administered 2012-10-29 – 2012-10-30 (×4): 200 mg via ORAL
  Filled 2012-10-29 (×4): qty 2

## 2012-10-29 MED ORDER — ALBUTEROL SULFATE (5 MG/ML) 0.5% IN NEBU: 2.5000 mg | INHALATION_SOLUTION | RESPIRATORY_TRACT | Status: DC | PRN

## 2012-10-29 MED ORDER — PANTOPRAZOLE SODIUM 40 MG PO TBEC
40.0000 mg | DELAYED_RELEASE_TABLET | Freq: Every morning | ORAL | Status: DC
  Administered 2012-10-29 – 2012-10-30 (×2): 40 mg via ORAL
  Filled 2012-10-29 (×2): qty 1

## 2012-10-29 MED ORDER — FLEET ENEMA 7-19 GM/118ML RE ENEM: 1.0000 | ENEMA | Freq: Once | RECTAL | Status: AC | PRN

## 2012-10-29 MED ORDER — PREDNISONE 10 MG PO TABS
5.0000 mg | ORAL_TABLET | Freq: Every day | ORAL | Status: DC
  Administered 2012-10-30: 5 mg via ORAL
  Filled 2012-10-29: qty 1

## 2012-10-29 MED ORDER — ALBUTEROL SULFATE (5 MG/ML) 0.5% IN NEBU
2.5000 mg | INHALATION_SOLUTION | Freq: Four times a day (QID) | RESPIRATORY_TRACT | Status: DC
  Administered 2012-10-29 – 2012-10-30 (×5): 2.5 mg via RESPIRATORY_TRACT
  Filled 2012-10-29 (×6): qty 0.5

## 2012-10-29 MED ORDER — DEXTROSE 5 % IV SOLN
1.0000 g | Freq: Once | INTRAVENOUS | Status: AC
  Administered 2012-10-29: 1 g via INTRAVENOUS
  Filled 2012-10-29: qty 10

## 2012-10-29 MED ORDER — ASPIRIN EC 81 MG PO TBEC
81.0000 mg | DELAYED_RELEASE_TABLET | Freq: Every morning | ORAL | Status: DC
  Administered 2012-10-29 – 2012-10-30 (×2): 81 mg via ORAL
  Filled 2012-10-29 (×2): qty 1

## 2012-10-29 MED ORDER — DEXTROSE 5 % IV SOLN
INTRAVENOUS | Status: AC
  Filled 2012-10-29: qty 500

## 2012-10-29 MED ORDER — GUAIFENESIN ER 600 MG PO TB12
600.0000 mg | ORAL_TABLET | Freq: Two times a day (BID) | ORAL | Status: DC
  Administered 2012-10-29 – 2012-10-30 (×3): 600 mg via ORAL
  Filled 2012-10-29 (×3): qty 1

## 2012-10-29 MED ORDER — OMEGA-3-ACID ETHYL ESTERS 1 G PO CAPS
1000.0000 mg | ORAL_CAPSULE | Freq: Every morning | ORAL | Status: DC
  Administered 2012-10-29 – 2012-10-30 (×2): 1000 mg via ORAL
  Filled 2012-10-29 (×2): qty 1

## 2012-10-29 MED ORDER — ATORVASTATIN CALCIUM 20 MG PO TABS
20.0000 mg | ORAL_TABLET | Freq: Every day | ORAL | Status: DC
  Administered 2012-10-29: 20 mg via ORAL
  Filled 2012-10-29: qty 1

## 2012-10-29 MED ORDER — OSELTAMIVIR PHOSPHATE 75 MG PO CAPS
75.0000 mg | ORAL_CAPSULE | Freq: Two times a day (BID) | ORAL | Status: DC
  Administered 2012-10-29 (×2): 75 mg via ORAL
  Filled 2012-10-29 (×2): qty 1

## 2012-10-29 MED ORDER — DEXTROSE 5 % IV SOLN
500.0000 mg | INTRAVENOUS | Status: DC
  Administered 2012-10-29 – 2012-10-30 (×2): 500 mg via INTRAVENOUS
  Filled 2012-10-29 (×4): qty 500

## 2012-10-29 MED ORDER — INSULIN ASPART 100 UNIT/ML ~~LOC~~ SOLN
0.0000 [IU] | Freq: Three times a day (TID) | SUBCUTANEOUS | Status: DC
  Administered 2012-10-29 (×2): 3 [IU] via SUBCUTANEOUS
  Administered 2012-10-29: 1 [IU] via SUBCUTANEOUS
  Administered 2012-10-30: 3 [IU] via SUBCUTANEOUS

## 2012-10-29 MED ORDER — IPRATROPIUM BROMIDE 0.02 % IN SOLN
0.5000 mg | Freq: Four times a day (QID) | RESPIRATORY_TRACT | Status: DC
  Administered 2012-10-29 – 2012-10-30 (×5): 0.5 mg via RESPIRATORY_TRACT
  Filled 2012-10-29 (×6): qty 2.5

## 2012-10-29 MED ORDER — ONDANSETRON HCL 4 MG/2ML IJ SOLN: 4.0000 mg | INTRAMUSCULAR | Status: DC | PRN

## 2012-10-29 MED ORDER — TRAZODONE HCL 50 MG PO TABS: 25.0000 mg | ORAL_TABLET | Freq: Every evening | ORAL | Status: DC | PRN

## 2012-10-29 MED ORDER — SODIUM CHLORIDE 0.9 % IV SOLN: INTRAVENOUS | Status: AC

## 2012-10-29 MED ORDER — PREDNISONE 10 MG PO TABS
10.0000 mg | ORAL_TABLET | Freq: Two times a day (BID) | ORAL | Status: DC
  Administered 2012-10-29: 10 mg via ORAL
  Filled 2012-10-29: qty 1

## 2012-10-29 MED ORDER — IBUPROFEN 100 MG/5ML PO SUSP
200.0000 mg | Freq: Four times a day (QID) | ORAL | Status: DC
  Administered 2012-10-29 – 2012-10-30 (×5): 200 mg via ORAL
  Filled 2012-10-29 (×5): qty 10

## 2012-10-29 MED ORDER — DOCUSATE SODIUM 100 MG PO CAPS: 100.0000 mg | ORAL_CAPSULE | Freq: Every day | ORAL | Status: DC | PRN

## 2012-10-29 MED ORDER — GLUCERNA SHAKE PO LIQD
237.0000 mL | Freq: Two times a day (BID) | ORAL | Status: DC
  Administered 2012-10-29 – 2012-10-30 (×2): 237 mL via ORAL

## 2012-10-29 MED ORDER — BISACODYL 10 MG RE SUPP: 10.0000 mg | Freq: Every day | RECTAL | Status: DC | PRN

## 2012-10-29 MED ORDER — METFORMIN HCL 500 MG PO TABS
500.0000 mg | ORAL_TABLET | Freq: Every day | ORAL | Status: DC
Start: 2012-10-29 — End: 2012-10-30
  Administered 2012-10-29 – 2012-10-30 (×2): 500 mg via ORAL
  Filled 2012-10-29 (×2): qty 1

## 2012-10-29 MED ORDER — ACETAMINOPHEN 650 MG RE SUPP: 650.0000 mg | Freq: Four times a day (QID) | RECTAL | Status: DC | PRN

## 2012-10-29 MED ORDER — POTASSIUM CHLORIDE IN NACL 20-0.9 MEQ/L-% IV SOLN
INTRAVENOUS | Status: DC
  Administered 2012-10-29 – 2012-10-30 (×2): via INTRAVENOUS

## 2012-10-29 MED ORDER — DEXTROSE 5 % IV SOLN
1.0000 g | INTRAVENOUS | Status: DC
  Administered 2012-10-29: 1 g via INTRAVENOUS
  Filled 2012-10-29 (×3): qty 10

## 2012-10-29 MED ORDER — INSULIN ASPART 100 UNIT/ML ~~LOC~~ SOLN: 0.0000 [IU] | Freq: Every day | SUBCUTANEOUS | Status: DC

## 2012-10-29 MED ORDER — ACETAMINOPHEN 325 MG PO TABS: 650.0000 mg | ORAL_TABLET | ORAL | Status: DC | PRN

## 2012-10-29 MED ORDER — LINAGLIPTIN 5 MG PO TABS
5.0000 mg | ORAL_TABLET | Freq: Every morning | ORAL | Status: DC
  Administered 2012-10-29 – 2012-10-30 (×2): 5 mg via ORAL
  Filled 2012-10-29 (×2): qty 1

## 2012-10-29 MED ORDER — TRAMADOL HCL 50 MG PO TABS: 50.0000 mg | ORAL_TABLET | Freq: Four times a day (QID) | ORAL | Status: DC | PRN

## 2012-10-29 NOTE — Progress Notes (Signed)
UR Chart Review Completed  

## 2012-10-29 NOTE — Progress Notes (Signed)
Inpatient Diabetes Program Recommendations  AACE/ADA: New Consensus Statement on Inpatient Glycemic Control (2013)  Target Ranges:  Prepandial:   less than 140 mg/dL      Peak postprandial:   less than 180 mg/dL (1-2 hours)      Critically ill patients:  140 - 180 mg/dL   Results for JEANNI, ALLSHOUSE (MRN [id number]) as of 10/29/2012 11:29  Ref. Range 10/28/2012 21:36 10/29/2012 03:56  Glucose Latest Range: 70-99 mg/dL 409 (H) 811 (H)   Results for SHAMEL, GALYEAN (MRN [id number]) as of 10/29/2012 11:29  Ref. Range 10/29/2012 07:38  Glucose-Capillary Latest Range: 70-99 mg/dL 213 (H)    Inpatient Diabetes Program Recommendations Correction (SSI): Please consider increasing Novolog correction scale to moderate and titrate up to resistant if needed.  Note: Patient has a history of diabetes and takes Tradjenta 5 mg QAM and Glucophage 500 mg QAM at home for diabetes management.  Currently patient is ordered to receive Novolog sensitive correction ACHS and Metformin 500 mg QAM for inpatient glycemic control.  Patient is also receiving Prednisone 10 mg BID as an inpatient (at home takes Prednisone 5mg  daily) which is likely contributing to hyperglycemia.  Please consider increasing Novolog correction scale to moderate and titrate up the the resistant scale if necessary to improve glycemic control.  Will continue to follow.  Thanks, Orlando Penner, RN, BSN, CCRN Diabetes Coordinator Inpatient Diabetes Program [phone]

## 2012-10-29 NOTE — H&P (Signed)
[facility] History and Physical  [name]  ZOX:[id number]  DOB: 03/29/1927   DOA: 10/29/2012   PCP:   Romeo Rabon, MD   Chief Complaint:  Hypotension  HPI: [name] is an 79 y.o. female.  Elderly African American lady with a long-standing history of rheumatoid arthritis on chronic prednisone therapy, has been having upper respiratory symptoms for the past few days. Symptoms include fever and chills cough cold and runny nose. And wheezing. She visited an urgent care today and her blood pressure was found to be in the 90s, with a temperature of 99.4, and she was advised to come to the emergency room immediately for admission. Patient reports that she did get her flu shot and a pneumonia shot this year.   Cough is productive of white sputum.  At baseline she ambulates with a walker and added personal assistance.  Rewiew of Systems:    All systems negative except as marked bold or noted in the HPI;  Constitutional:  malaise, fever and chills. ;  Eyes:  eye pain, redness and discharge. ;  ENMT:  ear pain, hoarseness, nasal congestion, sinus pressure and sore throat. ;  Cardiovascular:  chest pain, palpitations, diaphoresis, dyspnea and peripheral edema. ;  Respiratory:  cough, hemoptysis, wheezing and stridor. ;  Gastrointestinal:  nausea, vomiting, diarrhea, constipation, abdominal pain, melena, blood in stool, hematemesis, jaundice and rectal bleeding. unusual weight loss..   Genitourinary:  frequency, dysuria, incontinence,flank pain and hematuria; Musculoskeletal:  back pain and neck pain.  swelling and trauma.; Pain knees from rheumatoid Skin: .  pruritus, rash, abrasions, bruising and skin lesion.; ulcerations Neuro:  headache, lightheadedness and neck stiffness.  weakness, altered level of consciousness , altered mental status, extremity weakness, burning feet, involuntary movement, seizure and syncope.  Psych:  anxiety, depression, insomnia, tearfulness, panic  attacks, hallucinations, paranoia, suicidal or homicidal ideation     Past Medical History  Diagnosis Date  . Asthma   . Hypertension   . Diabetes mellitus without complication   . Acid reflux     Past Surgical History  Procedure Date  . Hernia repair   . Breast surgery     Medications:  HOME MEDS: Prior to Admission medications   Medication Sig Start Date End Date Taking? Authorizing Provider  albuterol (PROVENTIL HFA) 108 (90 BASE) MCG/ACT inhaler Inhale 2 puffs into the lungs every 6 (six) hours as needed. For shortness of breath   Yes Historical Provider, MD  albuterol (PROVENTIL) (2.5 MG/3ML) 0.083% nebulizer solution Take 2.5 mg by nebulization every 6 (six) hours as needed. With Ipratropium as needed for shortness of breath   Yes Historical Provider, MD  aspirin EC 81 MG tablet Take 81 mg by mouth every morning.   Yes Historical Provider, MD  atorvastatin (LIPITOR) 20 MG tablet Take 20 mg by mouth at bedtime.   Yes Historical Provider, MD  docusate sodium (DULCOLAX) 100 MG capsule Take 100 mg by mouth daily as needed. For constipation   Yes Historical Provider, MD  fish oil-omega-3 fatty acids 1000 MG capsule Take 1 g by mouth every morning.   Yes Historical Provider, MD  Fluticasone-Salmeterol (ADVAIR) 250-50 MCG/DOSE AEPB Inhale 1 puff into the lungs every 12 (twelve) hours.   Yes Historical Provider, MD  ibuprofen (ADVIL,MOTRIN) 100 MG/5ML suspension Take 200 mg by mouth daily as needed. Given once only for fever   Yes Historical Provider, MD  ipratropium (ATROVENT) 0.02 % nebulizer solution Take 500 mcg by nebulization 4 (four) times daily.  With albuterol as needed for shortness of breath   Yes Historical Provider, MD  linagliptin (TRADJENTA) 5 MG TABS tablet Take 5 mg by mouth every morning.   Yes Historical Provider, MD  metFORMIN (GLUCOPHAGE) 500 MG tablet Take 500 mg by mouth every morning.   Yes Historical Provider, MD  metoprolol succinate (TOPROL-XL) 25 MG 24 hr  tablet Take 25 mg by mouth every morning.   Yes Historical Provider, MD  Multiple Vitamins-Minerals (CENTRUM SILVER ADULT 50+ PO) Take 1 tablet by mouth every morning.   Yes Historical Provider, MD  pantoprazole (PROTONIX) 40 MG tablet Take 40 mg by mouth every morning.   Yes Historical Provider, MD  predniSONE (DELTASONE) 5 MG tablet Take 5 mg by mouth every morning.   Yes Historical Provider, MD  raloxifene (EVISTA) 60 MG tablet Take 60 mg by mouth every morning.   Yes Historical Provider, MD  traMADol (ULTRAM) 50 MG tablet Take 50 mg by mouth every 6 (six) hours as needed. For pain   Yes Historical Provider, MD  Vitamin D, Ergocalciferol, (DRISDOL) 50000 UNITS CAPS Take 50,000 Units by mouth every 30 (thirty) days.   Yes Historical Provider, MD     Allergies:  Allergies  Allergen Reactions  . Aspirin Other (See Comments)    Cannot take uncoated due to stomach ulcers    Social History:   reports that she has never smoked. She does not have any smokeless tobacco history on file. She reports that she does not drink alcohol or use illicit drugs.  Family History: Family History  Problem Relation Age of Onset  . Asthma Sister   . Rheum arthritis Daughter      Physical Exam: Filed Vitals:   10/28/12 2032 10/28/12 2326 10/29/12 0042  BP: 106/47  117/50  Pulse: 89  103  Temp: 98.8 F (37.1 C) 99.3 F (37.4 C) 100.1 F (37.8 C)  TempSrc: Oral Oral Oral  Resp: 20    Height: 5\' 3"  (1.6 m)    Weight: 49.442 kg (109 lb)    SpO2: 94%  92%   Blood pressure 117/50, pulse 103, temperature 100.1 F (37.8 C), temperature source Oral, resp. rate 20, height 5\' 3"  (1.6 m), weight 49.442 kg (109 lb), SpO2 92.00%.  GEN:  Pleasant elderly African American lady lying in the stretcher, somewhat crippled and deformed by arthritis but in no acute distress; coughing frequently; cooperative with exam PSYCH:  alert and oriented x4; does not appear anxious or depressed; affect is appropriate. HEENT:  Mucous membranes pink and anicteric; PERRLA; EOM intact; no cervical lymphadenopathy nor thyromegaly or carotid bruit; no JVD; Breasts:: Not examined CHEST WALL: No tenderness CHEST: Prolonged expiration, diminished breath sounds HEART: Regular rate and rhythm; no murmurs rubs or gallops BACK: Marked kyphosis slight scoliosis; no CVA tenderness ABDOMEN:, soft non-tender; no masses, no organomegaly, normal abdominal bowel sounds;  no intertriginous candida. Rectal Exam: Not done EXTREMITIES: Marked deformity of both hands; knees; no edema; no ulcerations. Genitalia: not examined PULSES: 2+ and symmetric SKIN: Normal hydration no rash or ulceration CNS: Cranial nerves 2-12 grossly intact no focal lateralizing neurologic deficit   Labs on Admission:  Basic Metabolic Panel:  Lab 10/28/12 1610  NA 134*  K 4.2  CL 98  CO2 29  GLUCOSE 190*  BUN 13  CREATININE 0.73  CALCIUM 8.9  MG --  PHOS --   Liver Function Tests: No results found for this basename: AST:5,ALT:5,ALKPHOS:5,BILITOT:5,PROT:5,ALBUMIN:5 in the last 168 hours No results found for this  basename: LIPASE:5,AMYLASE:5 in the last 168 hours No results found for this basename: AMMONIA:5 in the last 168 hours CBC:  Lab 10/28/12 2136  WBC 18.7*  NEUTROABS 15.2*  HGB 11.5*  HCT 35.9*  MCV 91.1  PLT 187   Cardiac Enzymes: No results found for this basename: CKTOTAL:5,CKMB:5,CKMBINDEX:5,TROPONINI:5 in the last 168 hours BNP: No components found with this basename: POCBNP:5 D-dimer: No components found with this basename: D-DIMER:5 CBG: No results found for this basename: GLUCAP:5 in the last 168 hours  Radiological Exams on Admission: Dg Chest Portable 1 View  10/28/2012  *RADIOLOGY REPORT*  Clinical Data: Hypotension, weakness and fever.  PORTABLE CHEST - 1 VIEW  Comparison: Chest radiograph performed 12/17/2010  Findings: The lungs are mildly hypoexpanded.  There is mild elevation of the left hemidiaphragm.  Mild  basilar airspace opacities are thought to reflect atelectasis, though pneumonia could conceivably have a similar appearance.  Pulmonary vascularity is at the upper limits of normal, with some vascular crowding seen.  No pleural effusion or pneumothorax is seen.  The left lung apex is partially obscured by the patient's head.  The cardiomediastinal silhouette is normal in size; calcification is noted in the aortic arch.  No acute osseous abnormalities are seen.  IMPRESSION: Lungs mildly hypoexpanded, with mild elevation of the left hemidiaphragm.  Mild bibasilar airspace opacities are thought to reflect atelectasis, though pneumonia could conceivably have a similar appearance.  Lungs otherwise grossly clear.   Original Report Authenticated By: Tonia Ghent, M.D.       Assessment/Plan Present on Admission:  . Viral syndrome Acute bronchitis  . Rheumatoid arthritis . UTI (lower urinary tract infection) . Bronchitis . HTN (hypertension) . Diabetes type 2, controlled . GERD (gastroesophageal reflux disease)   PLAN: We'll admit this lady for nebulization and antibiotics for asthmatic bronchitis. Will check influenza PCR and give Tamiflu as needed Will temporarily increase her baseline dose of steroids to stress dose Cautious IV fluid hydration  Rocephin for urinary tract infection pending the results of cultures Telemetry because of history of hypotension Continue management of her chronic medical conditions  Other plans as per orders.  Code Status:FULL CODE  Family Communication: Both daughters present for evaluation examination and discussion of plans Disposition Plan: Expected to be discharged back home with family but depends on the results of initial therapy    Ommie Degeorge Nocturnist [facility] Pager [phone]   10/29/2012, 2:12 AM

## 2012-10-29 NOTE — Progress Notes (Signed)
ANTIBIOTIC CONSULT NOTE - INITIAL  Pharmacy Consult for Rocephin Indication: urinary tract infection  Allergies  Allergen Reactions  . Aspirin Other (See Comments)    Cannot take uncoated due to stomach ulcers    Patient Measurements: Height: 5\' 3"  (160 cm) Weight: 109 lb (49.442 kg) IBW/kg (Calculated) : 52.4   Vital Signs: Temp: 100.5 F (38.1 C) (01/24 0240) Temp src: Oral (01/24 0240) BP: 103/42 mmHg (01/24 0240) Pulse Rate: 107  (01/24 0240) Intake/Output from previous day: 01/23 0701 - 01/24 0700 In: 1000 [I.V.:1000] Out: -  Intake/Output from this shift:    Labs:  [facility] 10/29/12 0356 10/28/12 2136  WBC 15.8* 18.7*  HGB 10.4* 11.5*  PLT 151 187  LABCREA -- --  CREATININE 0.72 0.73   Estimated Creatinine Clearance: 41.6 ml/min (by C-G formula based on Cr of 0.72). No results found for this basename: VANCOTROUGH:2,VANCOPEAK:2,VANCORANDOM:2,GENTTROUGH:2,GENTPEAK:2,GENTRANDOM:2,TOBRATROUGH:2,TOBRAPEAK:2,TOBRARND:2,AMIKACINPEAK:2,AMIKACINTROU:2,AMIKACIN:2, in the last 72 hours   Microbiology: No results found for this or any previous visit (from the past 720 hour(s)).  Medical History: Past Medical History  Diagnosis Date  . Asthma   . Hypertension   . Diabetes mellitus without complication   . Acid reflux     Medications:  Scheduled:    . [COMPLETED] sodium chloride   Intravenous Once  . sodium chloride   Intravenous STAT  . albuterol  2.5 mg Nebulization Q6H  . aspirin EC  81 mg Oral q morning - 10a  . atorvastatin  20 mg Oral QHS  . azithromycin  500 mg Intravenous Q24H  . benzonatate  200 mg Oral TID  . [COMPLETED] cefTRIAXone (ROCEPHIN) IVPB 1 gram/50 mL D5W  1 g Intravenous Once  . cefTRIAXone (ROCEPHIN)  IV  1 g Intravenous Q24H  . enoxaparin (LOVENOX) injection  30 mg Subcutaneous Q24H  . guaiFENesin  600 mg Oral BID  . ibuprofen  200 mg Oral Q6H  . insulin aspart  0-5 Units Subcutaneous QHS  . insulin aspart  0-9 Units Subcutaneous TID  WC  . ipratropium  0.5 mg Nebulization Q6H  . metFORMIN  500 mg Oral Q breakfast  . mometasone-formoterol  2 puff Inhalation BID  . omega-3 acid ethyl esters  1,000 mg Oral q morning - 10a  . oseltamivir  75 mg Oral BID  . pantoprazole  40 mg Oral q morning - 10a  . predniSONE  10 mg Oral BID WC  . raloxifene  60 mg Oral q morning - 10a  . [COMPLETED] sodium chloride  1,000 mL Intravenous Once   Assessment: 79 yo F to start on Rocephin for possible UTI.  She is also on Zithromax & Tamiflu.   Rocephin does not require renal dose adjustments.   Goal of Therapy:  Eradicate infection.  Plan:  1) Rocephin 1gm IV Q24h 2) Pharmacy to sign off. Please re-consult as needed.  Elson Clan 10/29/2012,8:13 AM

## 2012-10-29 NOTE — ED Notes (Signed)
Attempted to call report, nurse not avail will call back

## 2012-10-29 NOTE — Progress Notes (Signed)
INITIAL NUTRITION ASSESSMENT  DOCUMENTATION CODES Per approved criteria  -Not Applicable   INTERVENTION: Glucerna Shake po BID, each supplement provides 220 kcal and 10 grams of protein.  NUTRITION DIAGNOSIS: Inadequate oral food intake related to decreased appetite as evidenced by pt report, PO: 60%.   Goal: 1) Pt will maintain current wt of 109# 2) Pt will meet >75% of estimated energy needs  Monitor:  PO intake, skin integrity, labs, weight changes, changes in status  Reason for Assessment:  Low braden=12  79 y.o. female  Admitting Dx: CAP (community acquired pneumonia)  ASSESSMENT: Pt is a very pleasant lady who lives at home with her daughters, who are all very supportive and involved in her care.  Pt reports decreased appetite (PO: 60%). She states "I don't always feel like eating, but my daughters force me to". She has no teeth. She has dentures at home, but does not use them. She denies trouble with chewing or swallowing a regular consistency diet.  Pt reports she drinks Glucerna at home and is agreeable to ordering it in the hospital to supplement her diet.  Pt does not meet criteria for malnutrition at this time, however, is at risk for malnutrition and development of pressure ulcers given her limited mobility and decreased PO intake.   Height: Ht Readings from Last 1 Encounters:  10/28/12 5\' 3"  (1.6 m)    Weight: Wt Readings from Last 1 Encounters:  10/28/12 109 lb (49.442 kg)    Ideal Body Weight: 115#  % Ideal Body Weight: 95%  Wt Readings from Last 10 Encounters:  10/28/12 109 lb (49.442 kg)    Usual Body Weight: 109#  % Usual Body Weight: 100%  BMI:  Body mass index is 19.31 kg/(m^2). Classified as normal weight  Estimated Nutritional Needs: Kcal: 1300-1500 daily Protein: 49-58 grams daily Fluid: 1 ml/kcal daily  Skin: healing wound to sacrum  Diet Order: Carb Control  EDUCATION NEEDS: -Education needs addressed   Intake/Output  Summary (Last 24 hours) at 10/29/12 1310 Last data filed at 10/29/12 1246  Gross per 24 hour  Intake   1640 ml  Output      0 ml  Net   1640 ml    Last BM: 10/26/12  Labs:   Lab 10/29/12 0356 10/28/12 2136  NA 135 134*  K 4.1 4.2  CL 100 98  CO2 29 29  BUN 13 13  CREATININE 0.72 0.73  CALCIUM 8.0* 8.9  MG -- --  PHOS -- --  GLUCOSE 223* 190*    CBG (last 3)   Basename 10/29/12 1130 10/29/12 0738  GLUCAP 148* 234*    Scheduled Meds:   . sodium chloride   Intravenous STAT  . albuterol  2.5 mg Nebulization Q6H  . aspirin EC  81 mg Oral q morning - 10a  . atorvastatin  20 mg Oral QHS  . azithromycin  500 mg Intravenous Q24H  . benzonatate  200 mg Oral TID  . cefTRIAXone (ROCEPHIN)  IV  1 g Intravenous Q24H  . enoxaparin (LOVENOX) injection  30 mg Subcutaneous Q24H  . feeding supplement  237 mL Oral BID BM  . guaiFENesin  600 mg Oral BID  . ibuprofen  200 mg Oral Q6H  . insulin aspart  0-5 Units Subcutaneous QHS  . insulin aspart  0-9 Units Subcutaneous TID WC  . ipratropium  0.5 mg Nebulization Q6H  . linagliptin  5 mg Oral q morning - 10a  . metFORMIN  500 mg Oral  Q breakfast  . mometasone-formoterol  2 puff Inhalation BID  . omega-3 acid ethyl esters  1,000 mg Oral q morning - 10a  . pantoprazole  40 mg Oral q morning - 10a  . predniSONE  5 mg Oral Q breakfast  . raloxifene  60 mg Oral q morning - 10a    Continuous Infusions:   . 0.9 % NaCl with KCl 20 mEq / L 50 mL/hr at 10/29/12 1610    Past Medical History  Diagnosis Date  . Asthma   . Hypertension   . Diabetes mellitus without complication   . Acid reflux     Past Surgical History  Procedure Date  . Hernia repair   . Breast surgery     Melody Haver, RD, LDN Pager: [phone]

## 2012-10-29 NOTE — Evaluation (Signed)
Physical Therapy Evaluation Patient Details Name: [name] MRN: [id number] DOB: 03/29/1927 Today's Date: 10/29/2012 Time: 1308-6578 PT Time Calculation (min): 36 min  PT Assessment / Plan / Recommendation Clinical Impression  Pt was seen for eval.  She is very pleasant and cooperative.  She has severe immobility due to advanced RA affecting most joints.  Her mobility is quite limited at baseline but she is able to ambulate from room to room with a walker at home.  Her family is very supportive and she has a CAP aide many hours/week.  Currently, pt is requiring max assist to transfer OOB to chair.  She is not strong enough to walk.  Per family, her husband died about a week ago and pt has been in a decline since then.  The daughters decline HHPT stating that they "have been through this before".  They have adequate home DME and should be able to manage at home.            PT Assessment  Patient needs continued PT services    Follow Up Recommendations  No PT follow up    Does the patient have the potential to tolerate intense rehabilitation      Barriers to Discharge None      Equipment Recommendations  None recommended by PT    Recommendations for Other Services     Frequency Min 3X/week    Precautions / Restrictions Precautions Precautions: Fall Restrictions Weight Bearing Restrictions: No   Pertinent Vitals/Pain       Mobility  Bed Mobility Bed Mobility: Supine to Sit Supine to Sit: 1: +1 Total assist Details for Bed Mobility Assistance: pt normally needs full assist to transfer Transfers Transfers: Stand Pivot Transfers Stand Pivot Transfers: 2: Max assist Details for Transfer Assistance: pt is able to stand on feet, but unable to move them for pivoting Ambulation/Gait Ambulation/Gait Assistance: Not tested (comment)    Shoulder Instructions     Exercises General Exercises - Upper Extremity Shoulder Flexion: AAROM;Both;5 reps;Supine Elbow Flexion:  Strengthening;Both;5 reps;Supine Elbow Extension: Strengthening;Both;5 reps;Supine General Exercises - Lower Extremity Ankle Circles/Pumps: AROM;Both;5 reps;Supine Short Arc Quad: AROM;AAROM;Both;5 reps;Supine Heel Slides: AAROM;Both;5 reps;Supine Hip ABduction/ADduction: AAROM;Both;5 reps;Supine   PT Diagnosis: Difficulty walking;Generalized weakness  PT Problem List: Decreased strength;Decreased range of motion;Decreased activity tolerance;Decreased balance;Decreased mobility PT Treatment Interventions: DME instruction;Gait training;Functional mobility training;Therapeutic exercise   PT Goals Acute Rehab PT Goals PT Goal Formulation: With patient/family Time For Goal Achievement: 11/12/12 Potential to Achieve Goals: Good Pt will go Supine/Side to Sit: with max assist PT Goal: Supine/Side to Sit - Progress: Goal set today Pt will go Sit to Stand: with mod assist;with upper extremity assist PT Goal: Sit to Stand - Progress: Goal set today Pt will Transfer Bed to Chair/Chair to Bed: with mod assist PT Transfer Goal: Bed to Chair/Chair to Bed - Progress: Goal set today  Visit Information  Last PT Received On: 10/29/12    Subjective Data  Subjective: I am fine Patient Stated Goal: return home   Prior Functioning  Home Living Lives With: Daughter Available Help at Discharge: Family;Available 24 hours/day Type of Home: House Home Access: Ramped entrance Home Layout: One level Home Adaptive Equipment: Walker - rolling;Bedside commode/3-in-1;Wheelchair - manual;Shower chair without back Prior Function Level of Independence: Needs assistance Needs Assistance: Bathing;Dressing;Grooming;Toileting;Meal Prep;Light Housekeeping;Gait;Transfers Able to Take Stairs?: No Driving: No Vocation: Retired Musician: No difficulties    Cognition  Overall Cognitive Status: Appears within functional limits for tasks assessed/performed Arousal/Alertness:  Awake/alert Orientation Level: Appears intact for tasks assessed Behavior During Session: [facility] for tasks performed    Extremity/Trunk Assessment Right Upper Extremity Assessment RUE ROM/Strength/Tone: Deficits RUE ROM/Strength/Tone Deficits: limited shoulder ROM, hand ROM due to OA RUE Sensation: WFL - Light Touch Left Upper Extremity Assessment LUE ROM/Strength/Tone: Deficits LUE ROM/Strength/Tone Deficits: fingers in full flexion, but pt reports that she is able to hold eating utensils LUE Sensation: WFL - Light Touch LUE Coordination: WFL - gross motor Right Lower Extremity Assessment RLE ROM/Strength/Tone: Deficits RLE ROM/Strength/Tone Deficits: strength =2/5 throughout RLE Sensation: WFL - Light Touch Left Lower Extremity Assessment LLE ROM/Strength/Tone: Deficits LLE ROM/Strength/Tone Deficits: strength generally 3/5 LLE Sensation: WFL - Light Touch LLE Coordination: WFL - gross motor Trunk Assessment Trunk Assessment: Kyphotic (severe scoliosis with head fully laterally bent L)   Balance Balance Balance Assessed: Yes Static Sitting Balance Static Sitting - Balance Support: No upper extremity supported;Feet supported Static Sitting - Level of Assistance: 2: Max assist (leans backward due to limited hip flex)  End of Session PT - End of Session Equipment Utilized During Treatment: Gait belt Activity Tolerance: Patient tolerated treatment well Patient left: in chair;with call bell/phone within reach;with family/visitor present Nurse Communication: Mobility status  GP     Myrlene Broker L 10/29/2012, 12:01 PM

## 2012-10-29 NOTE — Progress Notes (Addendum)
Chart reviewed.  Subjective: Feels a little better today. Still coughing. Her daughter, cough started 4 days ago. She has been weaker than usual. She has been having fevers at home. There is an aide present during the weekdays and family member available at all times. Patient usually remains in bed, or recliner. Sometimes, she walks a bit with a walker, but has been weaker recently. Admits to mild dysuria.  Objective: Vital signs in last 24 hours: Filed Vitals:   10/28/12 2326 10/29/12 0042 10/29/12 0240 10/29/12 0855  BP:  117/50 103/42   Pulse:  103 107   Temp: 99.3 F (37.4 C) 100.1 F (37.8 C) 100.5 F (38.1 C)   TempSrc: Oral Oral Oral   Resp:      Height:      Weight:      SpO2:  92%  100%   Weight change:   Intake/Output Summary (Last 24 hours) at 10/29/12 1030 Last data filed at 10/29/12 0838  Gross per 24 hour  Intake   1400 ml  Output      0 ml  Net   1400 ml   General: Wet cough occasionally. No respiratory distress. Alert oriented and appropriate. Deformities from rheumatoid arthritis noted throughout. Neck: torticollis to right Lungs: Rhonchi. No wheeze. Good air movement. No rales. Abdomen soft nontender nondistended Extremities nonpitting edema present  Lab Results: Basic Metabolic Panel:  Lab 10/29/12 2130 10/28/12 2136  NA 135 134*  K 4.1 4.2  CL 100 98  CO2 29 29  GLUCOSE 223* 190*  BUN 13 13  CREATININE 0.72 0.73  CALCIUM 8.0* 8.9  MG -- --  PHOS -- --   Liver Function Tests:  Lab 10/29/12 0356  AST 29  ALT 26  ALKPHOS 65  BILITOT 0.2*  PROT 5.4*  ALBUMIN 2.3*   No results found for this basename: LIPASE:2,AMYLASE:2 in the last 168 hours No results found for this basename: AMMONIA:2 in the last 168 hours CBC:  Lab 10/29/12 0356 10/28/12 2136  WBC 15.8* 18.7*  NEUTROABS -- 15.2*  HGB 10.4* 11.5*  HCT 32.8* 35.9*  MCV 92.4 91.1  PLT 151 187   Cardiac Enzymes: No results found for this basename:  CKTOTAL:3,CKMB:3,CKMBINDEX:3,TROPONINI:3 in the last 168 hours BNP: No results found for this basename: PROBNP:3 in the last 168 hours D-Dimer: No results found for this basename: DDIMER:2 in the last 168 hours CBG:  Lab 10/29/12 0738  GLUCAP 234*   Hemoglobin A1C: No results found for this basename: HGBA1C in the last 168 hours Fasting Lipid Panel: No results found for this basename: CHOL,HDL,LDLCALC,TRIG,CHOLHDL,LDLDIRECT in the last 865 hours Thyroid Function Tests: No results found for this basename: TSH,T4TOTAL,FREET4,T3FREE,THYROIDAB in the last 168 hours Urinalysis:  Lab 10/28/12 2310  COLORURINE YELLOW  LABSPEC 1.015  PHURINE 6.0  GLUCOSEU >1000*  HGBUR SMALL*  BILIRUBINUR NEGATIVE  KETONESUR NEGATIVE  PROTEINUR NEGATIVE  UROBILINOGEN 0.2  NITRITE NEGATIVE  LEUKOCYTESUR MODERATE*   Micro Results: No results found for this or any previous visit (from the past 240 hour(s)). Studies/Results: Dg Chest Portable 1 View  10/28/2012  *RADIOLOGY REPORT*  Clinical Data: Hypotension, weakness and fever.  PORTABLE CHEST - 1 VIEW  Comparison: Chest radiograph performed 12/17/2010  Findings: The lungs are mildly hypoexpanded.  There is mild elevation of the left hemidiaphragm.  Mild basilar airspace opacities are thought to reflect atelectasis, though pneumonia could conceivably have a similar appearance.  Pulmonary vascularity is at the upper limits of normal, with some vascular crowding  seen.  No pleural effusion or pneumothorax is seen.  The left lung apex is partially obscured by the patient's head.  The cardiomediastinal silhouette is normal in size; calcification is noted in the aortic arch.  No acute osseous abnormalities are seen.  IMPRESSION: Lungs mildly hypoexpanded, with mild elevation of the left hemidiaphragm.  Mild bibasilar airspace opacities are thought to reflect atelectasis, though pneumonia could conceivably have a similar appearance.  Lungs otherwise grossly clear.    Original Report Authenticated By: Tonia Ghent, M.D.    Scheduled Meds:   . sodium chloride   Intravenous STAT  . albuterol  2.5 mg Nebulization Q6H  . aspirin EC  81 mg Oral q morning - 10a  . atorvastatin  20 mg Oral QHS  . azithromycin  500 mg Intravenous Q24H  . benzonatate  200 mg Oral TID  . cefTRIAXone (ROCEPHIN)  IV  1 g Intravenous Q24H  . enoxaparin (LOVENOX) injection  30 mg Subcutaneous Q24H  . guaiFENesin  600 mg Oral BID  . ibuprofen  200 mg Oral Q6H  . insulin aspart  0-5 Units Subcutaneous QHS  . insulin aspart  0-9 Units Subcutaneous TID WC  . ipratropium  0.5 mg Nebulization Q6H  . metFORMIN  500 mg Oral Q breakfast  . mometasone-formoterol  2 puff Inhalation BID  . omega-3 acid ethyl esters  1,000 mg Oral q morning - 10a  . pantoprazole  40 mg Oral q morning - 10a  . predniSONE  5 mg Oral Q breakfast  . raloxifene  60 mg Oral q morning - 10a   Continuous Infusions:   . 0.9 % NaCl with KCl 20 mEq / L 50 mL/hr at 10/29/12 0333   PRN Meds:.acetaminophen, acetaminophen, albuterol, bisacodyl, docusate sodium, ondansetron (ZOFRAN) IV, sodium phosphate, traMADol, traZODone Assessment/Plan: Principal Problem:  *CAP (community acquired pneumonia): Patient's chest x-ray is equivocal, but clinical picture is one of bacterial pneumonia. Flu PCR negative. Continue Rocephin and azithromycin. Will check a sputum culture. Urine Legionella and strep pneumonia antigen. Will continue IV fluid. Get a PT consult for out of bed. Stop Tamiflu. Active Problems:  Rheumatoid arthritis: On chronic low-dose steroids. Blood pressure has been adequate here. We'll decrease back down to 5 mg a day and monitor. Possible UTI (lower urinary tract infection): Urinalysis is contaminated with many squamous epithelial cells, but ceftriaxone would cover.  HTN (hypertension): Medications held  Diabetes type 2,: Will check hemoglobin A1c, continue outpatient medications. Hyperglycemia likely in part  related to higher dose of prednisone.  GERD (gastroesophageal reflux disease)    LOS: 1 day   Chia Rock L 10/29/2012, 10:30 AM

## 2012-10-29 NOTE — Care Management Note (Unsigned)
    Page 1 of 1   10/29/2012     11:30:48 AM   CARE MANAGEMENT NOTE 10/29/2012  Patient:  [name], [name]   Account Number:  [id number]  Date Initiated:  10/29/2012  Documentation initiated by:  Rosemary Holms  Subjective/Objective Assessment:   Pt lives at home; her daughter Dondra Spry lives with her. Her daughter Alona Bene is at the bedside. Pt has Rheumatoid Arthritis and unable to care for self. In the past pt has had HH through [facility]. Currently has a CNA who come qd and every     Action/Plan:   other weekend for 3-6 hours. Pt always has someone with her   Anticipated DC Date:  11/01/2012   Anticipated DC Plan:  HOME W HOME HEALTH SERVICES      DC Planning Services  CM consult      Choice offered to / List presented to:             Status of service:  In process, will continue to follow Medicare Important Message given?   (If response is "NO", the following Medicare IM given date fields will be blank) Date Medicare IM given:   Date Additional Medicare IM given:    Discharge Disposition:    Per UR Regulation:    If discussed at Long Length of Stay Meetings, dates discussed:    Comments:  10/29/12 Rosemary Holms RN BNS CM

## 2012-10-30 DIAGNOSIS — B349 Viral infection, unspecified: Secondary | ICD-10-CM | POA: Diagnosis present

## 2012-10-30 DIAGNOSIS — J40 Bronchitis, not specified as acute or chronic: Secondary | ICD-10-CM | POA: Diagnosis present

## 2012-10-30 LAB — GLUCOSE, CAPILLARY: Glucose-Capillary: 204 mg/dL — ABNORMAL HIGH (ref 70–99)

## 2012-10-30 LAB — URINE CULTURE

## 2012-10-30 MED ORDER — METFORMIN HCL 500 MG PO TABS: 500.0000 mg | ORAL_TABLET | Freq: Two times a day (BID) | ORAL | Status: DC

## 2012-10-30 MED ORDER — DOCUSATE SODIUM 100 MG PO CAPS
100.0000 mg | ORAL_CAPSULE | Freq: Every day | ORAL | Status: DC
  Administered 2012-10-30: 100 mg via ORAL
  Filled 2012-10-30: qty 1

## 2012-10-30 MED ORDER — CEFUROXIME AXETIL 500 MG PO TABS: 500.0000 mg | ORAL_TABLET | Freq: Two times a day (BID) | ORAL | Status: DC

## 2012-10-30 MED ORDER — AZITHROMYCIN 250 MG PO TABS: 250.0000 mg | ORAL_TABLET | Freq: Every day | ORAL | Status: DC

## 2012-10-30 NOTE — Progress Notes (Signed)
Patient received discharge instructions along with follow up appointments and prescriptions. Patient's daughter verbalized understanding of all instructions. Patient was escorted by staff via wheelchair to vehicle. Patient discharged to home in stable condition. 

## 2012-10-30 NOTE — Discharge Summary (Signed)
Physician Discharge Summary  Patient ID: [name] MRN: [id number] DOB/AGE: 77/24/1928 77 y.o.  Admit date: 10/28/2012 Discharge date: 10/30/2012  Discharge Diagnoses:  Principal Problem:  *CAP (community acquired pneumonia) Active Problems:  Rheumatoid arthritis  possible UTI (lower urinary tract infection)  HTN (hypertension)  Diabetes type 2, controlled  GERD (gastroesophageal reflux disease) Chronic constipation    Medication List     As of 10/30/2012  9:06 AM    STOP taking these medications         CENTRUM SILVER ADULT 50+ PO      TAKE these medications         aspirin EC 81 MG tablet   Take 81 mg by mouth every morning.      atorvastatin 20 MG tablet   Commonly known as: LIPITOR   Take 20 mg by mouth at bedtime.      azithromycin 250 MG tablet   Commonly known as: ZITHROMAX   Take 1 tablet (250 mg total) by mouth daily.      cefUROXime 500 MG tablet   Commonly known as: CEFTIN   Take 1 tablet (500 mg total) by mouth 2 (two) times daily.      DULCOLAX 100 MG capsule   Generic drug: docusate sodium   Take 100 mg by mouth daily as needed. For constipation      fish oil-omega-3 fatty acids 1000 MG capsule   Take 1 g by mouth every morning.      Fluticasone-Salmeterol 250-50 MCG/DOSE Aepb   Commonly known as: ADVAIR   Inhale 1 puff into the lungs every 12 (twelve) hours.      ibuprofen 100 MG/5ML suspension   Commonly known as: ADVIL,MOTRIN   Take 200 mg by mouth daily as needed. Given once only for fever      ipratropium 0.02 % nebulizer solution   Commonly known as: ATROVENT   Take 500 mcg by nebulization 4 (four) times daily. With albuterol as needed for shortness of breath      metFORMIN 500 MG tablet   Commonly known as: GLUCOPHAGE   Take 1 tablet (500 mg total) by mouth 2 (two) times daily with a meal.      metoprolol succinate 25 MG 24 hr tablet   Commonly known as: TOPROL-XL   Take 25 mg by mouth every morning.      pantoprazole 40  MG tablet   Commonly known as: PROTONIX   Take 40 mg by mouth every morning.      predniSONE 5 MG tablet   Commonly known as: DELTASONE   Take 5 mg by mouth every morning.      PROVENTIL HFA 108 (90 BASE) MCG/ACT inhaler   Generic drug: albuterol   Inhale 2 puffs into the lungs every 6 (six) hours as needed. For shortness of breath      albuterol (2.5 MG/3ML) 0.083% nebulizer solution   Commonly known as: PROVENTIL   Take 2.5 mg by nebulization every 6 (six) hours as needed. With Ipratropium as needed for shortness of breath      raloxifene 60 MG tablet   Commonly known as: EVISTA   Take 60 mg by mouth every morning.      TRADJENTA 5 MG Tabs tablet   Generic drug: linagliptin   Take 5 mg by mouth every morning.      traMADol 50 MG tablet   Commonly known as: ULTRAM   Take 50 mg by mouth every 6 (six) hours as  needed. For pain      Vitamin D (Ergocalciferol) 50000 UNITS Caps   Commonly known as: DRISDOL   Take 50,000 Units by mouth every 30 (thirty) days.           Discharge Orders    Future Orders Please Complete By Expires   Diet Carb Modified      Walk with assistance        Follow-up Information    Follow up with CAPLAN,MICHAEL, MD. In 2 weeks.   Contact information:   [address] [place] [address] [phone]          Disposition: 01-Home or Self Care with CAP aide and 24-hour supervision  Discharged Condition: Improved  Consults:   physical therapy  Labs:   Results for orders placed during the hospital encounter of 10/28/12 (from the past 48 hour(s))  CBC WITH DIFFERENTIAL     Status: Abnormal   Collection Time   10/28/12  9:36 PM      Component Value Range Comment   WBC 18.7 (*) 4.0 - 10.5 K/uL    RBC 3.94  3.87 - 5.11 MIL/uL    Hemoglobin 11.5 (*) 12.0 - 15.0 g/dL    HCT 91.4 (*) 78.2 - 46.0 %    MCV 91.1  78.0 - 100.0 fL    MCH 29.2  26.0 - 34.0 pg    MCHC 32.0  30.0 - 36.0 g/dL    RDW 95.6  21.3 - 08.6 %    Platelets 187   150 - 400 K/uL    Neutrophils Relative 81 (*) 43 - 77 %    Neutro Abs 15.2 (*) 1.7 - 7.7 K/uL    Lymphocytes Relative 12  12 - 46 %    Lymphs Abs 2.2  0.7 - 4.0 K/uL    Monocytes Relative 7  3 - 12 %    Monocytes Absolute 1.2 (*) 0.1 - 1.0 K/uL    Eosinophils Relative 0  0 - 5 %    Eosinophils Absolute 0.1  0.0 - 0.7 K/uL    Basophils Relative 0  0 - 1 %    Basophils Absolute 0.0  0.0 - 0.1 K/uL   BASIC METABOLIC PANEL     Status: Abnormal   Collection Time   10/28/12  9:36 PM      Component Value Range Comment   Sodium 134 (*) 135 - 145 mEq/L    Potassium 4.2  3.5 - 5.1 mEq/L    Chloride 98  96 - 112 mEq/L    CO2 29  19 - 32 mEq/L    Glucose, Bld 190 (*) 70 - 99 mg/dL    BUN 13  6 - 23 mg/dL    Creatinine, Ser 5.78  0.50 - 1.10 mg/dL    Calcium 8.9  8.4 - 46.9 mg/dL    GFR calc non Af Amer 77 (*) >90 mL/min    GFR calc Af Amer 89 (*) >90 mL/min   URINALYSIS, ROUTINE W REFLEX MICROSCOPIC     Status: Abnormal   Collection Time   10/28/12 11:10 PM      Component Value Range Comment   Color, Urine YELLOW  YELLOW    APPearance CLOUDY (*) CLEAR    Specific Gravity, Urine 1.015  1.005 - 1.030    pH 6.0  5.0 - 8.0    Glucose, UA >1000 (*) NEGATIVE mg/dL    Hgb urine dipstick SMALL (*) NEGATIVE    Bilirubin Urine  NEGATIVE  NEGATIVE    Ketones, ur NEGATIVE  NEGATIVE mg/dL    Protein, ur NEGATIVE  NEGATIVE mg/dL    Urobilinogen, UA 0.2  0.0 - 1.0 mg/dL    Nitrite NEGATIVE  NEGATIVE    Leukocytes, UA MODERATE (*) NEGATIVE   URINE MICROSCOPIC-ADD ON     Status: Abnormal   Collection Time   10/28/12 11:10 PM      Component Value Range Comment   Squamous Epithelial / LPF MANY (*) RARE    WBC, UA TOO NUMEROUS TO COUNT  <3 WBC/hpf    RBC / HPF 3-6  <3 RBC/hpf    Bacteria, UA MANY (*) RARE   INFLUENZA PANEL BY PCR     Status: Normal   Collection Time   10/29/12  1:48 AM      Component Value Range Comment   Influenza A By PCR NEGATIVE  NEGATIVE    Influenza B By PCR NEGATIVE  NEGATIVE     H1N1 flu by pcr NOT DETECTED  NOT DETECTED   TSH     Status: Normal   Collection Time   10/29/12  2:54 AM      Component Value Range Comment   TSH 0.925  0.350 - 4.500 uIU/mL   HEMOGLOBIN A1C     Status: Abnormal   Collection Time   10/29/12  2:54 AM      Component Value Range Comment   Hemoglobin A1C 8.7 (*) <5.7 %    Mean Plasma Glucose 203 (*) <117 mg/dL   CBC     Status: Abnormal   Collection Time   10/29/12  3:56 AM      Component Value Range Comment   WBC 15.8 (*) 4.0 - 10.5 K/uL    RBC 3.55 (*) 3.87 - 5.11 MIL/uL    Hemoglobin 10.4 (*) 12.0 - 15.0 g/dL    HCT 16.1 (*) 09.6 - 46.0 %    MCV 92.4  78.0 - 100.0 fL    MCH 29.3  26.0 - 34.0 pg    MCHC 31.7  30.0 - 36.0 g/dL    RDW 04.5  40.9 - 81.1 %    Platelets 151  150 - 400 K/uL   COMPREHENSIVE METABOLIC PANEL     Status: Abnormal   Collection Time   10/29/12  3:56 AM      Component Value Range Comment   Sodium 135  135 - 145 mEq/L    Potassium 4.1  3.5 - 5.1 mEq/L    Chloride 100  96 - 112 mEq/L    CO2 29  19 - 32 mEq/L    Glucose, Bld 223 (*) 70 - 99 mg/dL    BUN 13  6 - 23 mg/dL    Creatinine, Ser 9.14  0.50 - 1.10 mg/dL    Calcium 8.0 (*) 8.4 - 10.5 mg/dL    Total Protein 5.4 (*) 6.0 - 8.3 g/dL    Albumin 2.3 (*) 3.5 - 5.2 g/dL    AST 29  0 - 37 U/L    ALT 26  0 - 35 U/L    Alkaline Phosphatase 65  39 - 117 U/L    Total Bilirubin 0.2 (*) 0.3 - 1.2 mg/dL    GFR calc non Af Amer 77 (*) >90 mL/min    GFR calc Af Amer 90 (*) >90 mL/min   GLUCOSE, CAPILLARY     Status: Abnormal   Collection Time   10/29/12  7:38 AM  Component Value Range Comment   Glucose-Capillary 234 (*) 70 - 99 mg/dL   GLUCOSE, CAPILLARY     Status: Abnormal   Collection Time   10/29/12 11:30 AM      Component Value Range Comment   Glucose-Capillary 148 (*) 70 - 99 mg/dL   GLUCOSE, CAPILLARY     Status: Abnormal   Collection Time   10/29/12  4:01 PM      Component Value Range Comment   Glucose-Capillary 211 (*) 70 - 99 mg/dL     GLUCOSE, CAPILLARY     Status: Abnormal   Collection Time   10/29/12  9:34 PM      Component Value Range Comment   Glucose-Capillary 187 (*) 70 - 99 mg/dL   GLUCOSE, CAPILLARY     Status: Abnormal   Collection Time   10/30/12  7:56 AM      Component Value Range Comment   Glucose-Capillary 204 (*) 70 - 99 mg/dL    Comment 1 Notify RN       Diagnostics:  Dg Chest Portable 1 View  10/28/2012  *RADIOLOGY REPORT*  Clinical Data: Hypotension, weakness and fever.  PORTABLE CHEST - 1 VIEW  Comparison: Chest radiograph performed 12/17/2010  Findings: The lungs are mildly hypoexpanded.  There is mild elevation of the left hemidiaphragm.  Mild basilar airspace opacities are thought to reflect atelectasis, though pneumonia could conceivably have a similar appearance.  Pulmonary vascularity is at the upper limits of normal, with some vascular crowding seen.  No pleural effusion or pneumothorax is seen.  The left lung apex is partially obscured by the patient's head.  The cardiomediastinal silhouette is normal in size; calcification is noted in the aortic arch.  No acute osseous abnormalities are seen.  IMPRESSION: Lungs mildly hypoexpanded, with mild elevation of the left hemidiaphragm.  Mild bibasilar airspace opacities are thought to reflect atelectasis, though pneumonia could conceivably have a similar appearance.  Lungs otherwise grossly clear.   Original Report Authenticated By: Tonia Ghent, M.D.    Full Code   Hospital Course: See H&P for complete admission details. The patient is a pleasant 79 year old black female with history of rheumatoid arthritis on chronic low-dose prednisone who was brought to the emergency room by family members with fever and cough. She had been to the urgent care Center and her blood pressure was noted to be in the 90s. Her temperature was 99.4, so staff sent her to the emergency room. She had white sputum production. In the emergency room, her temperature was 100.1. Blood  pressure 106/47. Respiratory rate 20. She was coughing frequently but in no other distress. She was alert and oriented. She had prolonged expiratory phase on lung exam with diminished breath sounds. Flu PCR was negative. The patient had no urinary symptoms. She did have white cells and bacteria in her urinalysis, but she also had many squamous epithelial cells, so this was likely contamination. Patient's chest x-ray showed mild bibasilar airspace opacities. Clinically, picture was consistent with pneumonia. She was treated with antibiotics for community-acquired pneumonia. By the time of discharge, she was afebrile, coughing last, feeling better and requesting discharge. She has chronic constipation. I have given her a stool softener and she may take a laxative when she gets home. She worked with physical therapy. Family has declined home physical therapy. Total time on the day of discharge greater than 30 minutes.  Discharge Exam:  Blood pressure 113/49, pulse 94, temperature 97.9 F (36.6 C), temperature source Oral, resp. rate  20, height 5\' 3"  (1.6 m), weight 59.2 kg (130 lb 8.2 oz), SpO2 96.00%.  General: Comfortable. Less cough. Lungs clear to auscultation bilaterally without wheeze rhonchi or rales Cardiovascular regular rate rhythm without murmurs gallops rubs Abdomen soft nontender nondistended Extremities no clubbing cyanosis or edema  Signed: Neesa Knapik L 10/30/2012, 9:06 AM

## 2013-02-19 ENCOUNTER — Emergency Department (HOSPITAL_COMMUNITY)
Admission: EM | Admit: 2013-02-19 | Discharge: 2013-02-19 | Disposition: A | Discharge status: Home / Self Care | Payer: Medicare Other | Attending: Emergency Medicine | Admitting: Emergency Medicine

## 2013-02-19 ENCOUNTER — Encounter (HOSPITAL_COMMUNITY): Payer: Self-pay

## 2013-02-19 ENCOUNTER — Emergency Department (HOSPITAL_COMMUNITY): Payer: Medicare Other

## 2013-02-19 DIAGNOSIS — M79609 Pain in unspecified limb: Secondary | ICD-10-CM | POA: Insufficient documentation

## 2013-02-19 DIAGNOSIS — R142 Eructation: Secondary | ICD-10-CM | POA: Insufficient documentation

## 2013-02-19 DIAGNOSIS — E119 Type 2 diabetes mellitus without complications: Secondary | ICD-10-CM | POA: Insufficient documentation

## 2013-02-19 DIAGNOSIS — Z8719 Personal history of other diseases of the digestive system: Secondary | ICD-10-CM | POA: Insufficient documentation

## 2013-02-19 DIAGNOSIS — M129 Arthropathy, unspecified: Secondary | ICD-10-CM | POA: Insufficient documentation

## 2013-02-19 DIAGNOSIS — K219 Gastro-esophageal reflux disease without esophagitis: Secondary | ICD-10-CM | POA: Insufficient documentation

## 2013-02-19 DIAGNOSIS — R109 Unspecified abdominal pain: Secondary | ICD-10-CM | POA: Insufficient documentation

## 2013-02-19 DIAGNOSIS — IMO0002 Reserved for concepts with insufficient information to code with codable children: Secondary | ICD-10-CM | POA: Insufficient documentation

## 2013-02-19 DIAGNOSIS — Z79899 Other long term (current) drug therapy: Secondary | ICD-10-CM | POA: Insufficient documentation

## 2013-02-19 DIAGNOSIS — Z7982 Long term (current) use of aspirin: Secondary | ICD-10-CM | POA: Insufficient documentation

## 2013-02-19 DIAGNOSIS — R141 Gas pain: Secondary | ICD-10-CM | POA: Insufficient documentation

## 2013-02-19 DIAGNOSIS — I10 Essential (primary) hypertension: Secondary | ICD-10-CM | POA: Insufficient documentation

## 2013-02-19 DIAGNOSIS — J45909 Unspecified asthma, uncomplicated: Secondary | ICD-10-CM | POA: Insufficient documentation

## 2013-02-19 LAB — URINALYSIS, ROUTINE W REFLEX MICROSCOPIC
Leukocytes, UA: NEGATIVE
Nitrite: NEGATIVE
Specific Gravity, Urine: 1.01 (ref 1.005–1.030)
Urobilinogen, UA: 0.2 mg/dL (ref 0.0–1.0)
pH: 6 (ref 5.0–8.0)

## 2013-02-19 LAB — COMPREHENSIVE METABOLIC PANEL
BUN: 13 mg/dL (ref 6–23)
Calcium: 9.4 mg/dL (ref 8.4–10.5)
Creatinine, Ser: 0.77 mg/dL (ref 0.50–1.10)
GFR calc Af Amer: 88 mL/min — ABNORMAL LOW (ref >90)
Glucose, Bld: 158 mg/dL — ABNORMAL HIGH (ref 70–99)
Sodium: 137 mEq/L (ref 135–145)
Total Protein: 6.6 g/dL (ref 6.0–8.3)

## 2013-02-19 LAB — CBC WITH DIFFERENTIAL/PLATELET
Eosinophils Absolute: 0 10*3/uL (ref 0.0–0.7)
Eosinophils Relative: 0 % (ref 0–5)
HCT: 43.1 % (ref 36.0–46.0)
Lymphs Abs: 1.5 10*3/uL (ref 0.7–4.0)
MCH: 29.1 pg (ref 26.0–34.0)
MCV: 92.1 fL (ref 78.0–100.0)
Monocytes Absolute: 0.3 10*3/uL (ref 0.1–1.0)
Platelets: 195 10*3/uL (ref 150–400)
RBC: 4.68 MIL/uL (ref 3.87–5.11)

## 2013-02-19 MED ORDER — IOHEXOL 300 MG/ML  SOLN
50.0000 mL | Freq: Once | INTRAMUSCULAR | Status: AC | PRN
  Administered 2013-02-19: 50 mL via ORAL

## 2013-02-19 MED ORDER — ONDANSETRON 4 MG PO TBDP: ORAL_TABLET | ORAL | Status: DC

## 2013-02-19 MED ORDER — HYDROCODONE-ACETAMINOPHEN 5-325 MG PO TABS: 2.0000 | ORAL_TABLET | ORAL | Status: DC | PRN

## 2013-02-19 MED ORDER — SODIUM CHLORIDE 0.9 % IV BOLUS (SEPSIS)
500.0000 mL | Freq: Once | INTRAVENOUS | Status: AC
  Administered 2013-02-19: 500 mL via INTRAVENOUS

## 2013-02-19 MED ORDER — IOHEXOL 300 MG/ML  SOLN
100.0000 mL | Freq: Once | INTRAMUSCULAR | Status: AC | PRN
  Administered 2013-02-19: 100 mL via INTRAVENOUS

## 2013-02-19 NOTE — ED Notes (Signed)
Warm blanket provided.

## 2013-02-19 NOTE — ED Provider Notes (Signed)
History  This chart was scribed for Sabrina Lyons, MD by [name], ED Scribe. This patient was seen in room [facility]16A/[facility]16A and the patient's care was started at 3:28 PM.  CSN: [id number]  Arrival date & time 02/19/13  1407   First MD Initiated Contact with Patient 02/19/13 1528      Chief Complaint  Patient presents with  . Arm Pain  . Abdominal Pain     The history is provided by the patient and a relative. No language interpreter was used.    HPI Comments: [name] is a 79 y.o. female who presents to the Emergency Department complaining of 2 days of intermittent left-sided abdominal pain that she attributes to recent diarrhea. Family in the room reports recent sick contacts last week with the same. She has a h/o diverticulitis but denies similarities. She also c/o left arm pain for the past 2 days usually at night while resting. She denies emesis, hematochezia and fever as associated symptoms. She has a h/o arthritis, HTN, DM and asthma.   Past Medical History  Diagnosis Date  . Asthma   . Hypertension   . Diabetes mellitus without complication   . Acid reflux     Past Surgical History  Procedure Laterality Date  . Hernia repair    . Breast surgery      Family History  Problem Relation Age of Onset  . Asthma Sister   . Rheum arthritis Daughter     History  Substance Use Topics  . Smoking status: Never Smoker   . Smokeless tobacco: Not on file  . Alcohol Use: No    No OB history provided.  Review of Systems  A complete 10 system review of systems was obtained and all systems are negative except as noted in the HPI and PMH.   Allergies  Aspirin  Home Medications   Current Outpatient Rx  Name  Route  Sig  Dispense  Refill  . acetaminophen (TYLENOL) 500 MG tablet   Oral   Take 500-1,000 mg by mouth every 6 (six) hours as needed for pain.         Marland Kitchen albuterol (PROVENTIL HFA) 108 (90 BASE) MCG/ACT inhaler   Inhalation   Inhale 2 puffs into  the lungs every 6 (six) hours as needed. For shortness of breath         . albuterol (PROVENTIL) (2.5 MG/3ML) 0.083% nebulizer solution   Nebulization   Take 2.5 mg by nebulization every 6 (six) hours as needed. With Ipratropium as needed for shortness of breath         . aspirin EC 81 MG tablet   Oral   Take 81 mg by mouth 2 (two) times daily.          Marland Kitchen atorvastatin (LIPITOR) 20 MG tablet   Oral   Take 20 mg by mouth at bedtime.         . fish oil-omega-3 fatty acids 1000 MG capsule   Oral   Take 1 g by mouth every morning.         . Fluticasone-Salmeterol (ADVAIR) 250-50 MCG/DOSE AEPB   Inhalation   Inhale 1 puff into the lungs every 12 (twelve) hours.         Marland Kitchen ipratropium (ATROVENT) 0.02 % nebulizer solution   Nebulization   Take 500 mcg by nebulization 4 (four) times daily. With albuterol as needed for shortness of breath         . linagliptin (TRADJENTA) 5  MG TABS tablet   Oral   Take 5 mg by mouth every morning.         . metFORMIN (GLUCOPHAGE) 500 MG tablet   Oral   Take 1 tablet (500 mg total) by mouth 2 (two) times daily with a meal.         . metoprolol succinate (TOPROL-XL) 25 MG 24 hr tablet   Oral   Take 25 mg by mouth every morning.         . Multiple Vitamins-Minerals (CENTRUM SILVER ADULT 50+ PO)   Oral   Take 1 tablet by mouth daily.         . pantoprazole (PROTONIX) 40 MG tablet   Oral   Take 40 mg by mouth every morning.         . Polyvinyl Alcohol-Povidone (CLEAR EYES ALL SEASONS) 5-6 MG/ML SOLN   Ophthalmic   Apply 1 drop to eye daily as needed (for allergy eye).         . predniSONE (DELTASONE) 5 MG tablet   Oral   Take 5 mg by mouth every morning.         . raloxifene (EVISTA) 60 MG tablet   Oral   Take 60 mg by mouth every morning.         . Vitamin D, Ergocalciferol, (DRISDOL) 50000 UNITS CAPS   Oral   Take 50,000 Units by mouth every 30 (thirty) days.         Marland Kitchen docusate sodium (DULCOLAX) 100 MG  capsule   Oral   Take 100 mg by mouth daily as needed. For constipation         . traMADol (ULTRAM) 50 MG tablet   Oral   Take 50 mg by mouth every 6 (six) hours as needed. For pain           Triage Vitals: BP 144/96  Pulse 74  Temp(Src) 98.1 F (36.7 C) (Oral)  Resp 16  SpO2 99%  Physical Exam  Nursing note and vitals reviewed. Constitutional: She is oriented to person, place, and time. She appears well-developed and well-nourished. No distress.  HENT:  Head: Normocephalic and atraumatic.  Eyes: Conjunctivae and EOM are normal.  Neck: Neck supple. No tracheal deviation present.  Cardiovascular: Normal rate and regular rhythm.   Pulmonary/Chest: Effort normal and breath sounds normal. No respiratory distress.  Abdominal: Soft. She exhibits distension. There is tenderness. There is no rebound and no guarding.  Tenderness to palpation in the LLQ, no rebound or guarding. The abdomen is slightly distended and somewhat tympanitic.  Musculoskeletal: Normal range of motion. She exhibits no edema.  Neurological: She is alert and oriented to person, place, and time.  Skin: Skin is warm and dry.  Psychiatric: She has a normal mood and affect. Her behavior is normal.    ED Course  Procedures (including critical care time)  DIAGNOSTIC STUDIES: Oxygen Saturation is 99% on room air, nroaml by my interpretation.    COORDINATION OF CARE: 3:41 PM-Discussed treatment plan which includes CT of abdomen, CBC panel, CMP and UA with pt and family at bedside and all agreed to plan.   Labs Reviewed  CBC WITH DIFFERENTIAL - Abnormal; Notable for the following:    Neutrophils Relative % 80 (*)    All other components within normal limits  COMPREHENSIVE METABOLIC PANEL - Abnormal; Notable for the following:    Glucose, Bld 158 (*)    Albumin 3.4 (*)    GFR calc non Af  Amer 76 (*)    GFR calc Af Amer 88 (*)    All other components within normal limits  URINALYSIS, ROUTINE W REFLEX  MICROSCOPIC - Abnormal; Notable for the following:    Glucose, UA 100 (*)    All other components within normal limits  LIPASE, BLOOD   Ct Abdomen Pelvis W Contrast  02/19/2013   *RADIOLOGY REPORT*  Clinical Data: abdominal pain  CT ABDOMEN AND PELVIS WITH CONTRAST  Technique:  Multidetector CT imaging of the abdomen and pelvis was performed following the standard protocol during bolus administration of intravenous contrast.  Contrast: 50mL OMNIPAQUE IOHEXOL 300 MG/ML  SOLN, OMNIPAQUE IOHEXOL 300 MG/ML  SOLN  Comparison: None  Findings:  Lung bases:  Previous right mastectomy.  There is asymmetric elevation of the left hemidiaphragm.  Small left pleural effusion is identified.  No pericardial effusion noted.  Abdomen/pelvis:  No focal liver abnormalities identified.  The gallbladder appears within normal limits. Mild increased caliber of the distal common bile duct and distal pancreatic duct which measure up to 6 mm.  No focal pancreatic mass identified.  The spleen appears normal.  The adrenal glands are both unremarkable.  The right kidney is normal.  The left kidney is normal.  The urinary bladder appears moderately distended.  The bladder wall is trabeculated and there are multiple diverticula identified arising from the left lateral wall.  The uterus has a normal appearance for patient's age.  There is a fluid attenuating cyst within the left ovary which measures 1.9 cm.  Calcified atherosclerotic change involves the abdominal aorta.  No aneurysm identified.  There is no upper abdominal adenopathy noted. There is no pelvic or inguinal adenopathy.  No free fluid or abnormal fluid collections identified.  Bones/Musculoskeletal:  There is a scoliosis deformity involving the lumbar spine which is convex to the left.  No aggressive lytic or sclerotic bone lesions identified.  IMPRESSION:  1.  No acute findings within the abdomen or pelvis. 2.  Suspect chronic bladder outlet and obstruction.  The prior is  distended and there are multiple bladder diverticula identified. 3. Increased caliber of the distal pancreatic duct and mild increased caliber of the common bile duct.  No obvious stone or obstructing mass noted.  If there is a clinical concern for a small stone or mass within the head of pancreas or ampulla further evaluation with contrast enhanced MRCP may be helpful. 4.  Simple appearing cyst is noted within the left ovary. 5.  Right inguinal hernia which contains a nonobstructed loop of small bowel.   Original Report Authenticated By: Signa Kell, M.D.     No diagnosis found.    MDM  No emergent cause or condition found on the ct scan.  There is mention of increased diameter of the cbd, but no evidence for pancreatitis or gallstones.  She is to follow up with her pcp if not improving, return for fever, increasing pain, or other new symptoms.     I personally performed the services described in this documentation, which was scribed in my presence. The recorded information has been reviewed and is accurate.     Sabrina Lyons, MD 02/19/13 [phone]

## 2013-02-19 NOTE — ED Notes (Signed)
1. Pt reports left arm pain fro 2 days, denies any known injury, thinks may be her arthritis . 2. Had diarrhea earlier in the week and now having upper ab pain, denies any urinary, no fever. No back pain, left flank pain at times. Pain comes and goes, no h/o of stones.

## 2013-02-19 NOTE — ED Notes (Signed)
Pt presents to er with left side abd pain that started yesterday, diarrhea that occurred two days ago, denies any n/v.

## 2013-02-19 NOTE — ED Notes (Signed)
Dr. Judd Lien at bedside with pt and family

## 2013-10-14 ENCOUNTER — Emergency Department (HOSPITAL_COMMUNITY)
Admission: EM | Admit: 2013-10-14 | Discharge: 2013-10-14 | Disposition: A | Discharge status: Home / Self Care | Payer: Medicare Other | Attending: Emergency Medicine | Admitting: Emergency Medicine

## 2013-10-14 ENCOUNTER — Emergency Department (HOSPITAL_COMMUNITY): Payer: Medicare Other

## 2013-10-14 ENCOUNTER — Encounter (HOSPITAL_COMMUNITY): Payer: Self-pay | Admitting: Emergency Medicine

## 2013-10-14 DIAGNOSIS — J40 Bronchitis, not specified as acute or chronic: Secondary | ICD-10-CM

## 2013-10-14 DIAGNOSIS — R5383 Other fatigue: Secondary | ICD-10-CM

## 2013-10-14 DIAGNOSIS — E119 Type 2 diabetes mellitus without complications: Secondary | ICD-10-CM | POA: Insufficient documentation

## 2013-10-14 DIAGNOSIS — Z7982 Long term (current) use of aspirin: Secondary | ICD-10-CM | POA: Insufficient documentation

## 2013-10-14 DIAGNOSIS — K219 Gastro-esophageal reflux disease without esophagitis: Secondary | ICD-10-CM | POA: Insufficient documentation

## 2013-10-14 DIAGNOSIS — IMO0002 Reserved for concepts with insufficient information to code with codable children: Secondary | ICD-10-CM | POA: Insufficient documentation

## 2013-10-14 DIAGNOSIS — I10 Essential (primary) hypertension: Secondary | ICD-10-CM | POA: Insufficient documentation

## 2013-10-14 DIAGNOSIS — Z79899 Other long term (current) drug therapy: Secondary | ICD-10-CM | POA: Insufficient documentation

## 2013-10-14 DIAGNOSIS — J45909 Unspecified asthma, uncomplicated: Secondary | ICD-10-CM | POA: Insufficient documentation

## 2013-10-14 DIAGNOSIS — R5381 Other malaise: Secondary | ICD-10-CM | POA: Insufficient documentation

## 2013-10-14 MED ORDER — AZITHROMYCIN 250 MG PO TABS: ORAL_TABLET | ORAL | Status: DC

## 2013-10-14 MED ORDER — HYDROCOD POLST-CHLORPHEN POLST 10-8 MG/5ML PO LQCR: 2.5000 mL | Freq: Two times a day (BID) | ORAL | Status: DC | PRN

## 2013-10-14 NOTE — ED Provider Notes (Signed)
CSN: [id number]     Arrival date & time 10/14/13  1431 History   First MD Initiated Contact with Patient 10/14/13 1505     Chief Complaint  Patient presents with  . Cough   (Consider location/radiation/quality/duration/timing/severity/associated sxs/prior Treatment) HPI .Marland KitchenMarland KitchenMarland KitchenMarland Kitchen productive cough of white sputum for one week with associated chest congestion, sneezing, weakness. No fever sweats or chills. She has been drinking fluids. She lives independently at home with a helper. Severity is mild to moderate. Nothing makes symptoms better or worse  Past Medical History  Diagnosis Date  . Asthma   . Hypertension   . Diabetes mellitus without complication   . Acid reflux    Past Surgical History  Procedure Laterality Date  . Hernia repair    . Breast surgery     Family History  Problem Relation Age of Onset  . Asthma Sister   . Rheum arthritis Daughter    History  Substance Use Topics  . Smoking status: Never Smoker   . Smokeless tobacco: Not on file  . Alcohol Use: No   OB History   Grav Para Term Preterm Abortions TAB SAB Ect Mult Living                 Review of Systems  All other systems reviewed and are negative.    Allergies  Aspirin  Home Medications   Current Outpatient Rx  Name  Route  Sig  Dispense  Refill  . albuterol (PROVENTIL HFA) 108 (90 BASE) MCG/ACT inhaler   Inhalation   Inhale 2 puffs into the lungs every 6 (six) hours as needed. For shortness of breath         . albuterol (PROVENTIL) (2.5 MG/3ML) 0.083% nebulizer solution   Nebulization   Take 2.5 mg by nebulization every 6 (six) hours as needed. With Ipratropium as needed for shortness of breath         . aspirin EC 81 MG tablet   Oral   Take 81 mg by mouth 2 (two) times daily.          Marland Kitchen atorvastatin (LIPITOR) 20 MG tablet   Oral   Take 20 mg by mouth at bedtime.         . budesonide-formoterol (SYMBICORT) 160-4.5 MCG/ACT inhaler   Inhalation   Inhale 2 puffs into the lungs 2  (two) times daily.         . fish oil-omega-3 fatty acids 1000 MG capsule   Oral   Take 1 g by mouth every morning.         Marland Kitchen ipratropium (ATROVENT) 0.02 % nebulizer solution   Nebulization   Take 500 mcg by nebulization 4 (four) times daily. With albuterol as needed for shortness of breath         . linagliptin (TRADJENTA) 5 MG TABS tablet   Oral   Take 5 mg by mouth every morning.         . metFORMIN (GLUCOPHAGE) 500 MG tablet   Oral   Take by mouth 3 (three) times daily.         . metoprolol succinate (TOPROL-XL) 25 MG 24 hr tablet   Oral   Take 25 mg by mouth every morning.         . Multiple Vitamins-Minerals (CENTRUM SILVER ADULT 50+ PO)   Oral   Take 1 tablet by mouth every morning.          . pantoprazole (PROTONIX) 40 MG tablet   Oral  Take 40 mg by mouth every morning.         . Polyvinyl Alcohol-Povidone (CLEAR EYES ALL SEASONS) 5-6 MG/ML SOLN   Ophthalmic   Apply 1 drop to eye daily as needed (for allergy eye).         . predniSONE (DELTASONE) 5 MG tablet   Oral   Take 5 mg by mouth every morning.         . raloxifene (EVISTA) 60 MG tablet   Oral   Take 60 mg by mouth every morning.         . traMADol (ULTRAM) 50 MG tablet   Oral   Take 50 mg by mouth every 6 (six) hours as needed. For pain         . Vitamin D, Ergocalciferol, (DRISDOL) 50000 UNITS CAPS   Oral   Take 50,000 Units by mouth every 30 (thirty) days.         Marland Kitchen azithromycin (ZITHROMAX Z-PAK) 250 MG tablet      2 po day one, then 1 daily x 4 days   5 tablet   0   . chlorpheniramine-HYDROcodone (TUSSIONEX PENNKINETIC ER) 10-8 MG/5ML LQCR   Oral   Take 2.5 mLs by mouth every 12 (twelve) hours as needed for cough.   100 mL   0    BP 131/74  Pulse 80  Temp(Src) 99.3 F (37.4 C) (Oral)  Resp 20  Wt 109 lb (49.442 kg)  SpO2 94% Physical Exam  Nursing note and vitals reviewed. Constitutional: She is oriented to person, place, and time. She appears  well-developed and well-nourished.  HENT:  Head: Normocephalic and atraumatic.  Eyes: Conjunctivae and EOM are normal. Pupils are equal, round, and reactive to light.  Neck: Normal range of motion. Neck supple.  Cardiovascular: Normal rate, regular rhythm and normal heart sounds.   Pulmonary/Chest: Effort normal and breath sounds normal.  Abdominal: Soft. Bowel sounds are normal.  Musculoskeletal: Normal range of motion.  Neurological: She is alert and oriented to person, place, and time.  Skin: Skin is warm and dry.  Psychiatric: She has a normal mood and affect. Her behavior is normal.    ED Course  Procedures (including critical care time) Labs Review Labs Reviewed - No data to display Imaging Review No results found.  EKG Interpretation   None       MDM   1. Bronchitis    Patient is nontoxic. Chest x-ray shows no pneumonia. Will start antibiotics secondary to longevity of symptoms and the fact that patient is immunocompromised. Rx Zithromax and Tussionex suspension    Nat Christen, MD 10/14/13 1759

## 2013-10-14 NOTE — ED Notes (Signed)
Cough, congestion, sneezing for 1 week,  Weak.  No fever. No vomiting or diarrhea

## 2013-10-14 NOTE — Discharge Instructions (Signed)
Bronchitis Bronchitis is a problem of the air tubes leading to your lungs. This problem makes it hard for air to get in and out of the lungs. You may cough a lot because your air tubes are narrow. Going without care can cause lasting (chronic) bronchitis. HOME CARE   Drink enough fluids to keep your pee (urine) clear or pale yellow.  Use a cool mist humidifier.  Quit smoking if you smoke. If you keep smoking, the bronchitis might not get better.  Only take medicine as told by your doctor. GET HELP RIGHT AWAY IF:   Coughing keeps you awake.  You start to wheeze.  You become more sick or weak.  You have a hard time breathing or get short of breath.  You cough up blood.  Coughing lasts more than 2 weeks.  You have a fever.  Your baby is older than 3 months with a rectal temperature of 102 F (38.9 C) or higher.  Your baby is 78 months old or younger with a rectal temperature of 100.4 F (38 C) or higher. MAKE SURE YOU:  Understand these instructions.  Will watch your condition.  Will get help right away if you are not doing well or get worse. Document Released: 03/10/2008 Document Revised: 12/15/2011 Document Reviewed: 05/17/2013 [facility] Patient Information 2014 [place], [place].   X-ray shows no pneumonia. Prescription for antibiotic and cough syrup. Increase fluids.  Tylenol for fever

## 2014-10-03 ENCOUNTER — Emergency Department (HOSPITAL_COMMUNITY)
Admission: EM | Admit: 2014-10-03 | Discharge: 2014-10-03 | Disposition: A | Discharge status: Home / Self Care | Payer: Medicare Other | Attending: Emergency Medicine | Admitting: Emergency Medicine

## 2014-10-03 ENCOUNTER — Emergency Department (HOSPITAL_COMMUNITY): Payer: Medicare Other

## 2014-10-03 ENCOUNTER — Encounter (HOSPITAL_COMMUNITY): Payer: Self-pay | Admitting: *Deleted

## 2014-10-03 DIAGNOSIS — Z862 Personal history of diseases of the blood and blood-forming organs and certain disorders involving the immune mechanism: Secondary | ICD-10-CM | POA: Diagnosis not present

## 2014-10-03 DIAGNOSIS — Z7951 Long term (current) use of inhaled steroids: Secondary | ICD-10-CM | POA: Insufficient documentation

## 2014-10-03 DIAGNOSIS — Z7982 Long term (current) use of aspirin: Secondary | ICD-10-CM | POA: Insufficient documentation

## 2014-10-03 DIAGNOSIS — N390 Urinary tract infection, site not specified: Secondary | ICD-10-CM | POA: Insufficient documentation

## 2014-10-03 DIAGNOSIS — R509 Fever, unspecified: Secondary | ICD-10-CM | POA: Insufficient documentation

## 2014-10-03 DIAGNOSIS — Z79899 Other long term (current) drug therapy: Secondary | ICD-10-CM | POA: Insufficient documentation

## 2014-10-03 DIAGNOSIS — K219 Gastro-esophageal reflux disease without esophagitis: Secondary | ICD-10-CM | POA: Insufficient documentation

## 2014-10-03 DIAGNOSIS — I10 Essential (primary) hypertension: Secondary | ICD-10-CM | POA: Diagnosis not present

## 2014-10-03 DIAGNOSIS — J45901 Unspecified asthma with (acute) exacerbation: Secondary | ICD-10-CM | POA: Insufficient documentation

## 2014-10-03 DIAGNOSIS — E119 Type 2 diabetes mellitus without complications: Secondary | ICD-10-CM | POA: Diagnosis not present

## 2014-10-03 DIAGNOSIS — R531 Weakness: Secondary | ICD-10-CM | POA: Insufficient documentation

## 2014-10-03 DIAGNOSIS — R630 Anorexia: Secondary | ICD-10-CM | POA: Diagnosis not present

## 2014-10-03 HISTORY — DX: Unspecified kyphosis, site unspecified: M40.209

## 2014-10-03 HISTORY — DX: Anemia, unspecified: D64.9

## 2014-10-03 HISTORY — DX: Gastric ulcer, unspecified as acute or chronic, without hemorrhage or perforation: K25.9

## 2014-10-03 HISTORY — DX: Raynaud's syndrome without gangrene: I73.00

## 2014-10-03 LAB — COMPREHENSIVE METABOLIC PANEL
ALT: 19 U/L (ref 0–35)
AST: 25 U/L (ref 0–37)
Albumin: 3.1 g/dL — ABNORMAL LOW (ref 3.5–5.2)
Alkaline Phosphatase: 55 U/L (ref 39–117)
Anion gap: 6 (ref 5–15)
BUN: 12 mg/dL (ref 6–23)
CALCIUM: 8.8 mg/dL (ref 8.4–10.5)
CO2: 31 mmol/L (ref 19–32)
Chloride: 100 mEq/L (ref 96–112)
Creatinine, Ser: 0.83 mg/dL (ref 0.50–1.10)
GFR calc Af Amer: 72 mL/min — ABNORMAL LOW (ref >90)
GFR calc non Af Amer: 63 mL/min — ABNORMAL LOW (ref >90)
Glucose, Bld: 155 mg/dL — ABNORMAL HIGH (ref 70–99)
Potassium: 4.3 mmol/L (ref 3.5–5.1)
SODIUM: 137 mmol/L (ref 135–145)
TOTAL PROTEIN: 6.3 g/dL (ref 6.0–8.3)
Total Bilirubin: 0.5 mg/dL (ref 0.3–1.2)

## 2014-10-03 LAB — CBC WITH DIFFERENTIAL/PLATELET
BASOS ABS: 0.1 10*3/uL (ref 0.0–0.1)
BASOS PCT: 1 % (ref 0–1)
EOS ABS: 0.1 10*3/uL (ref 0.0–0.7)
Eosinophils Relative: 1 % (ref 0–5)
HEMATOCRIT: 40.2 % (ref 36.0–46.0)
Hemoglobin: 12.3 g/dL (ref 12.0–15.0)
Lymphocytes Relative: 14 % (ref 12–46)
Lymphs Abs: 1.5 10*3/uL (ref 0.7–4.0)
MCH: 29.2 pg (ref 26.0–34.0)
MCHC: 30.6 g/dL (ref 30.0–36.0)
MCV: 95.5 fL (ref 78.0–100.0)
MONO ABS: 0.9 10*3/uL (ref 0.1–1.0)
Monocytes Relative: 8 % (ref 3–12)
Neutro Abs: 8.2 10*3/uL — ABNORMAL HIGH (ref 1.7–7.7)
Neutrophils Relative %: 76 % (ref 43–77)
PLATELETS: 190 10*3/uL (ref 150–400)
RBC: 4.21 MIL/uL (ref 3.87–5.11)
RDW: 13.2 % (ref 11.5–15.5)
WBC: 10.8 10*3/uL — AB (ref 4.0–10.5)

## 2014-10-03 LAB — URINALYSIS, ROUTINE W REFLEX MICROSCOPIC
Bilirubin Urine: NEGATIVE
GLUCOSE, UA: 100 mg/dL — AB
Ketones, ur: NEGATIVE mg/dL
Nitrite: POSITIVE — AB
Protein, ur: NEGATIVE mg/dL
Specific Gravity, Urine: <1.005 — ABNORMAL LOW (ref 1.005–1.030)
UROBILINOGEN UA: 0.2 mg/dL (ref 0.0–1.0)
pH: 6.5 (ref 5.0–8.0)

## 2014-10-03 LAB — URINE MICROSCOPIC-ADD ON

## 2014-10-03 MED ORDER — SODIUM CHLORIDE 0.9 % IV BOLUS (SEPSIS)
500.0000 mL | Freq: Once | INTRAVENOUS | Status: AC
  Administered 2014-10-03: 500 mL via INTRAVENOUS

## 2014-10-03 MED ORDER — ONDANSETRON 4 MG PO TBDP
ORAL_TABLET | ORAL | Status: DC
Start: 2014-10-03 — End: 2015-10-09

## 2014-10-03 MED ORDER — CEPHALEXIN 500 MG PO CAPS: 500.0000 mg | ORAL_CAPSULE | Freq: Four times a day (QID) | ORAL | Status: DC

## 2014-10-03 MED ORDER — CEFTRIAXONE SODIUM 1 G IJ SOLR
1.0000 g | Freq: Once | INTRAMUSCULAR | Status: AC
  Administered 2014-10-03: 1 g via INTRAVENOUS
  Filled 2014-10-03: qty 10

## 2014-10-03 NOTE — ED Notes (Signed)
In and out cath completed to obtain urine sample. Pt tolerated well. pericare provided prior to and after in and out cath.

## 2014-10-03 NOTE — ED Notes (Signed)
Family says cough, urine is "white"  No fever known,  No n/v.  No pain , but feels "weak"

## 2014-10-03 NOTE — Discharge Instructions (Signed)
Follow up with md in 2-3 days for recheck.  Drink plenty of fluids

## 2014-10-03 NOTE — ED Notes (Addendum)
Pt o2 saturation 88-90% with intermittent non-productive cough. Pt placed on 2 liters [place]. Pt o2 saturation 100%.

## 2014-10-03 NOTE — ED Provider Notes (Signed)
CSN: [id number]     Arrival date & time 10/03/14  1426 History  This chart was scribed for Maudry Diego, MD by Dellis Filbert, ED Scribe. The patient was seen in [place] and the patient's care was started at 3:15 PM.  Chief Complaint  Patient presents with  . Weakness   Patient is a 80 y.o. female presenting with weakness and fever. The history is provided by the patient. No language interpreter was used.  Weakness  Fever Temp source:  Subjective Timing:  Constant Chronicity:  New Relieved by:  Nothing Worsened by:  Nothing tried Ineffective treatments:  None tried Associated symptoms: chills   Associated symptoms: no congestion, no cough, no diarrhea and no rash     HPI Comments: [name] is a 80 y.o. female who presents to the Emergency Department complaining of cold like symptoms, onset 1 week ago. Pt complains of fever, chills and loss of appetite as associated symptoms. Pt states her urine is white and she believes this is due to an infection. Her PCP is in [place]   Past Medical History  Diagnosis Date  . Asthma   . Hypertension   . Diabetes mellitus without complication   . Acid reflux   . Kyphosis   . Gastric ulcer   . Raynaud disease   . Anemia    Past Surgical History  Procedure Laterality Date  . Hernia repair    . Breast surgery     Family History  Problem Relation Age of Onset  . Asthma Sister   . Rheum arthritis Daughter    History  Substance Use Topics  . Smoking status: Never Smoker   . Smokeless tobacco: Not on file  . Alcohol Use: No   OB History    No data available     Review of Systems  Constitutional: Positive for fever, chills and appetite change. Negative for fatigue.  HENT: Negative for congestion, ear discharge and sinus pressure.   Eyes: Negative for discharge.  Respiratory: Negative for cough.   Gastrointestinal: Negative for diarrhea.  Genitourinary: Negative for frequency and hematuria.  Musculoskeletal:  Negative for back pain.  Skin: Negative for rash.  Neurological: Positive for weakness. Negative for seizures.  Psychiatric/Behavioral: Negative for hallucinations.  All other systems reviewed and are negative.     Allergies  Aspirin  Home Medications   Prior to Admission medications   Medication Sig Start Date End Date Taking? Authorizing Provider  albuterol (PROVENTIL HFA) 108 (90 BASE) MCG/ACT inhaler Inhale 2 puffs into the lungs every 6 (six) hours as needed. For shortness of breath    Historical Provider, MD  albuterol (PROVENTIL) (2.5 MG/3ML) 0.083% nebulizer solution Take 2.5 mg by nebulization every 6 (six) hours as needed. With Ipratropium as needed for shortness of breath    Historical Provider, MD  aspirin EC 81 MG tablet Take 81 mg by mouth 2 (two) times daily.     Historical Provider, MD  atorvastatin (LIPITOR) 20 MG tablet Take 20 mg by mouth at bedtime.    Historical Provider, MD  azithromycin (ZITHROMAX Z-PAK) 250 MG tablet 2 po day one, then 1 daily x 4 days 10/14/13   Nat Christen, MD  budesonide-formoterol [facility]) 160-4.5 MCG/ACT inhaler Inhale 2 puffs into the lungs 2 (two) times daily.    Historical Provider, MD  chlorpheniramine-HYDROcodone (TUSSIONEX PENNKINETIC ER) 10-8 MG/5ML LQCR Take 2.5 mLs by mouth every 12 (twelve) hours as needed for cough. 10/14/13   Nat Christen, MD  fish  oil-omega-3 fatty acids 1000 MG capsule Take 1 g by mouth every morning.    Historical Provider, MD  ipratropium (ATROVENT) 0.02 % nebulizer solution Take 500 mcg by nebulization 4 (four) times daily. With albuterol as needed for shortness of breath    Historical Provider, MD  linagliptin (TRADJENTA) 5 MG TABS tablet Take 5 mg by mouth every morning.    Historical Provider, MD  metFORMIN (GLUCOPHAGE) 500 MG tablet Take by mouth 3 (three) times daily.    Historical Provider, MD  metoprolol succinate (TOPROL-XL) 25 MG 24 hr tablet Take 25 mg by mouth every morning.    Historical Provider, MD   Multiple Vitamins-Minerals (CENTRUM SILVER ADULT 50+ PO) Take 1 tablet by mouth every morning.     Historical Provider, MD  pantoprazole (PROTONIX) 40 MG tablet Take 40 mg by mouth every morning.    Historical Provider, MD  Polyvinyl Alcohol-Povidone (CLEAR EYES ALL SEASONS) 5-6 MG/ML SOLN Apply 1 drop to eye daily as needed (for allergy eye).    Historical Provider, MD  predniSONE (DELTASONE) 5 MG tablet Take 5 mg by mouth every morning.    Historical Provider, MD  raloxifene (EVISTA) 60 MG tablet Take 60 mg by mouth every morning.    Historical Provider, MD  traMADol (ULTRAM) 50 MG tablet Take 50 mg by mouth every 6 (six) hours as needed. For pain    Historical Provider, MD  Vitamin D, Ergocalciferol, (DRISDOL) 50000 UNITS CAPS Take 50,000 Units by mouth every 30 (thirty) days.    Historical Provider, MD   BP 96/45 mmHg  Pulse 91  Temp(Src) 99.4 F (37.4 C) (Oral)  Resp 20  Ht 5\' 2"  (1.575 m)  Wt 109 lb (49.442 kg)  BMI 19.93 kg/m2  SpO2 96% Physical Exam  Constitutional: She is oriented to person, place, and time. She appears well-developed.  HENT:  Head: Normocephalic.  Dry mucous membranes.  Eyes: Conjunctivae and EOM are normal. No scleral icterus.  Neck: Neck supple. No thyromegaly present.  Cardiovascular: Normal rate and regular rhythm.  Exam reveals no gallop and no friction rub.   No murmur heard. Pulmonary/Chest: No stridor. She has wheezes. She has no rales. She exhibits no tenderness.  Bilateral mastectomy.  Abdominal: She exhibits no distension. There is no tenderness. There is no rebound.  Musculoskeletal: Normal range of motion. She exhibits no edema.  Lymphadenopathy:    She has no cervical adenopathy.  Neurological: She is oriented to person, place, and time. She exhibits normal muscle tone. Coordination normal.  Skin: No rash noted. No erythema.  Psychiatric: She has a normal mood and affect. Her behavior is normal.    ED Course  Procedures  DIAGNOSTIC  STUDIES: Oxygen Saturation is 96% on room air, normal by my interpretation.    COORDINATION OF CARE: 3:20 PM Discussed treatment plan with pt at bedside and pt agreed to plan.   Labs Review Labs Reviewed  CBC WITH DIFFERENTIAL  COMPREHENSIVE METABOLIC PANEL  URINALYSIS, ROUTINE W REFLEX MICROSCOPIC    Imaging Review No results found.   EKG Interpretation None      MDM   Final diagnoses:  None    Uti,,   tx with  Keflex.   Follow up with pcp in 2-3 days.  Family wanted out pt tx  The chart was scribed for me under my direct supervision.  I personally performed the history, physical, and medical decision making and all procedures in the evaluation of this patient.Broadus John  Ellard Artis, MD 10/03/14 2048

## 2014-10-08 LAB — URINE CULTURE: Colony Count: 75000

## 2014-10-09 ENCOUNTER — Telehealth (HOSPITAL_BASED_OUTPATIENT_CLINIC_OR_DEPARTMENT_OTHER): Payer: Self-pay | Admitting: Emergency Medicine

## 2014-10-09 NOTE — Telephone Encounter (Signed)
Post ED Visit - Positive Culture Follow-up  Culture report reviewed by antimicrobial stewardship pharmacist: []  Sundra Aland, Pharm.D., BCPS [x]  Heide Guile, Pharm.D., BCPS []  Alycia Rossetti, Pharm.D., BCPS []  [place], Pharm.D., BCPS, AAHIVP []  Legrand Como, Pharm.D., BCPS, AAHIVP []  Isac Sarna, Pharm.D., BCPS  Positive urine culture E, Coli Treated with cephalexin, organism sensitive to the same and no further patient follow-up is required at this time.  Hazle Nordmann 10/09/2014, 2:23 PM

## 2015-03-19 ENCOUNTER — Encounter (HOSPITAL_COMMUNITY): Payer: Self-pay | Admitting: *Deleted

## 2015-03-19 ENCOUNTER — Emergency Department (HOSPITAL_COMMUNITY)
Admission: EM | Admit: 2015-03-19 | Discharge: 2015-03-19 | Disposition: A | Discharge status: Home / Self Care | Payer: Medicare Other | Attending: Emergency Medicine | Admitting: Emergency Medicine

## 2015-03-19 DIAGNOSIS — Z853 Personal history of malignant neoplasm of breast: Secondary | ICD-10-CM | POA: Diagnosis not present

## 2015-03-19 DIAGNOSIS — Z862 Personal history of diseases of the blood and blood-forming organs and certain disorders involving the immune mechanism: Secondary | ICD-10-CM | POA: Insufficient documentation

## 2015-03-19 DIAGNOSIS — N6320 Unspecified lump in the left breast, unspecified quadrant: Secondary | ICD-10-CM

## 2015-03-19 DIAGNOSIS — Z79899 Other long term (current) drug therapy: Secondary | ICD-10-CM | POA: Diagnosis not present

## 2015-03-19 DIAGNOSIS — Z792 Long term (current) use of antibiotics: Secondary | ICD-10-CM | POA: Diagnosis not present

## 2015-03-19 DIAGNOSIS — Z7952 Long term (current) use of systemic steroids: Secondary | ICD-10-CM | POA: Diagnosis not present

## 2015-03-19 DIAGNOSIS — M1990 Unspecified osteoarthritis, unspecified site: Secondary | ICD-10-CM | POA: Diagnosis not present

## 2015-03-19 DIAGNOSIS — M40209 Unspecified kyphosis, site unspecified: Secondary | ICD-10-CM | POA: Insufficient documentation

## 2015-03-19 DIAGNOSIS — Z7982 Long term (current) use of aspirin: Secondary | ICD-10-CM | POA: Diagnosis not present

## 2015-03-19 DIAGNOSIS — E119 Type 2 diabetes mellitus without complications: Secondary | ICD-10-CM | POA: Insufficient documentation

## 2015-03-19 DIAGNOSIS — J45909 Unspecified asthma, uncomplicated: Secondary | ICD-10-CM | POA: Insufficient documentation

## 2015-03-19 DIAGNOSIS — N63 Unspecified lump in breast: Secondary | ICD-10-CM | POA: Diagnosis not present

## 2015-03-19 DIAGNOSIS — Z7951 Long term (current) use of inhaled steroids: Secondary | ICD-10-CM | POA: Diagnosis not present

## 2015-03-19 DIAGNOSIS — I10 Essential (primary) hypertension: Secondary | ICD-10-CM | POA: Insufficient documentation

## 2015-03-19 DIAGNOSIS — K219 Gastro-esophageal reflux disease without esophagitis: Secondary | ICD-10-CM | POA: Diagnosis not present

## 2015-03-19 HISTORY — DX: Malignant (primary) neoplasm, unspecified: C80.1

## 2015-03-19 HISTORY — DX: Unspecified osteoarthritis, unspecified site: M19.90

## 2015-03-19 NOTE — Discharge Instructions (Signed)
Return tomorrow at 11:30 for a mammogram. Registered nurse will give you further instructions

## 2015-03-19 NOTE — ED Notes (Signed)
Swollen, firm area to lat aspect lt breast.

## 2015-03-19 NOTE — ED Provider Notes (Signed)
CSN: [id number]     Arrival date & time 03/19/15  55 History   First MD Initiated Contact with Patient 03/19/15 1304     Chief Complaint  Patient presents with  . Breast Problem     (Consider location/radiation/quality/duration/timing/severity/associated sxs/prior Treatment) HPI..... Left breast mass for unknown length of time. Patient is status post right mastectomy 30 years ago for breast cancer. No fever, chills, dyspnea, chest pain, weight loss. No other complaints.  Past Medical History  Diagnosis Date  . Asthma   . Hypertension   . Diabetes mellitus without complication   . Acid reflux   . Kyphosis   . Gastric ulcer   . Raynaud disease   . Anemia   . Arthritis   . Cancer    Past Surgical History  Procedure Laterality Date  . Hernia repair    . Breast surgery    . Mastectomy     Family History  Problem Relation Age of Onset  . Asthma Sister   . Rheum arthritis Daughter    History  Substance Use Topics  . Smoking status: Never Smoker   . Smokeless tobacco: Not on file  . Alcohol Use: No   OB History    No data available     Review of Systems  All other systems reviewed and are negative.     Allergies  Aspirin  Home Medications   Prior to Admission medications   Medication Sig Start Date End Date Taking? Authorizing Provider  albuterol (PROVENTIL HFA) 108 (90 BASE) MCG/ACT inhaler Inhale 2 puffs into the lungs every 6 (six) hours as needed. For shortness of breath   Yes Historical Provider, MD  albuterol (PROVENTIL) (2.5 MG/3ML) 0.083% nebulizer solution Take 2.5 mg by nebulization every 6 (six) hours as needed for wheezing or shortness of breath (Mix Albuterol with Ipratropium). With Ipratropium as needed for shortness of breath   Yes Historical Provider, MD  aspirin EC 81 MG tablet Take 81 mg by mouth 2 (two) times daily.    Yes Historical Provider, MD  atorvastatin (LIPITOR) 20 MG tablet Take 20 mg by mouth at bedtime.   Yes Historical Provider,  MD  budesonide-formoterol (SYMBICORT) 160-4.5 MCG/ACT inhaler Inhale 2 puffs into the lungs 2 (two) times daily.   Yes Historical Provider, MD  Cholecalciferol (VITAMIN D3) 1000 UNITS CAPS Take 1 capsule by mouth daily.   Yes Historical Provider, MD  fish oil-omega-3 fatty acids 1000 MG capsule Take 1 g by mouth every morning.   Yes Historical Provider, MD  ipratropium (ATROVENT) 0.02 % nebulizer solution Take 500 mcg by nebulization every 6 (six) hours as needed for wheezing or shortness of breath (*Mix Albuterol with Ipratropium). With albuterol as needed for shortness of breath   Yes Historical Provider, MD  linagliptin (TRADJENTA) 5 MG TABS tablet Take 5 mg by mouth every morning.   Yes Historical Provider, MD  metFORMIN (GLUCOPHAGE) 500 MG tablet Take 500-1,000 mg by mouth 2 (two) times daily. 1 in the morning and 2 at bedtime   Yes Historical Provider, MD  metoprolol succinate (TOPROL-XL) 25 MG 24 hr tablet Take 25 mg by mouth daily.   Yes Historical Provider, MD  Multiple Vitamins-Minerals (CENTRUM SILVER ADULT 50+ PO) Take 1 tablet by mouth every morning.    Yes Historical Provider, MD  pantoprazole (PROTONIX) 40 MG tablet Take 40 mg by mouth daily.   Yes Historical Provider, MD  Polyvinyl Alcohol-Povidone (CLEAR EYES ALL SEASONS) 5-6 MG/ML SOLN Apply 1 drop to  eye every morning.    Yes Historical Provider, MD  predniSONE (DELTASONE) 5 MG tablet Take 5 mg by mouth daily with breakfast.   Yes Historical Provider, MD  raloxifene (EVISTA) 60 MG tablet Take 60 mg by mouth every morning.   Yes Historical Provider, MD  traMADol (ULTRAM) 50 MG tablet Take 50 mg by mouth every 6 (six) hours as needed. For pain   Yes Historical Provider, MD  cephALEXin (KEFLEX) 500 MG capsule Take 1 capsule (500 mg total) by mouth 4 (four) times daily. Patient not taking: Reported on 03/19/2015 10/03/14   Milton Ferguson, MD  chlorpheniramine-HYDROcodone [facility]) 10-8 MG/5ML LQCR Take 2.5 mLs by mouth  every 12 (twelve) hours as needed for cough. Patient not taking: Reported on 10/03/2014 10/14/13   Nat Christen, MD  ondansetron (ZOFRAN ODT) 4 MG disintegrating tablet 4mg  ODT q4 hours prn nausea/vomit Patient not taking: Reported on 03/19/2015 10/03/14   Milton Ferguson, MD   BP 138/100 mmHg  Pulse 70  Temp(Src) 97 F (36.1 C) (Oral)  Resp 18  Ht 5\' 2"  (1.575 m)  Wt 110 lb (49.896 kg)  BMI 20.11 kg/m2  SpO2 99% Physical Exam  Constitutional: She is oriented to person, place, and time. She appears well-developed and well-nourished.  HENT:  Head: Normocephalic and atraumatic.  Eyes: Conjunctivae and EOM are normal. Pupils are equal, round, and reactive to light.  Neck: Normal range of motion. Neck supple.  Cardiovascular: Normal rate and regular rhythm.   Pulmonary/Chest: Effort normal and breath sounds normal.  Abdominal: Soft. Bowel sounds are normal.  Musculoskeletal: Normal range of motion.  Neurological: She is alert and oriented to person, place, and time.  Skin:  Left breast: 2.5 cm spherical indurated mass on lateral aspect of breast  Psychiatric: She has a normal mood and affect. Her behavior is normal.  Nursing note and vitals reviewed.   ED Course  Procedures (including critical care time) Labs Review Labs Reviewed - No data to display  Imaging Review No results found.   EKG Interpretation None      MDM   Final diagnoses:  Left breast mass    Disc possibility of cancer c pt and 2 daughters.  She will return tomorrow for US/mammogram    Nat Christen, MD 03/19/15 1540

## 2015-03-20 ENCOUNTER — Other Ambulatory Visit (HOSPITAL_COMMUNITY): Payer: Self-pay | Admitting: Internal Medicine

## 2015-03-20 ENCOUNTER — Ambulatory Visit (HOSPITAL_COMMUNITY)
Admission: RE | Admit: 2015-03-20 | Discharge: 2015-03-20 | Disposition: A | Discharge status: Home / Self Care | Payer: Medicare Other | Source: Ambulatory Visit | Attending: Internal Medicine | Admitting: Internal Medicine

## 2015-03-20 ENCOUNTER — Other Ambulatory Visit (HOSPITAL_COMMUNITY): Payer: Self-pay | Admitting: Emergency Medicine

## 2015-03-20 ENCOUNTER — Ambulatory Visit (HOSPITAL_COMMUNITY)
Admission: RE | Admit: 2015-03-20 | Discharge: 2015-03-20 | Disposition: A | Discharge status: Home / Self Care | Payer: Medicare Other | Source: Ambulatory Visit | Attending: Emergency Medicine | Admitting: Emergency Medicine

## 2015-03-20 DIAGNOSIS — N6320 Unspecified lump in the left breast, unspecified quadrant: Secondary | ICD-10-CM

## 2015-03-20 DIAGNOSIS — N63 Unspecified lump in breast: Secondary | ICD-10-CM | POA: Insufficient documentation

## 2015-03-20 MED ORDER — LIDOCAINE HCL (PF) 2 % IJ SOLN
INTRAMUSCULAR | Status: AC
  Filled 2015-03-20: qty 10

## 2015-03-20 NOTE — Discharge Instructions (Signed)
Breast Biopsy °Care After °These instructions give you information on caring for yourself after your procedure. Your doctor may also give you more specific instructions. Call your doctor if you have any problems or questions after your procedure. °HOME CARE °· Only take medicine as told by your doctor. °· Do not take aspirin. °· Keep your sutures (stitches) dry when bathing. °· Protect the biopsy area. Do not let the area get bumped. °· Avoid activities that could pull the biopsy site open until your doctor approves. This includes: °· Stretching. °· Reaching. °· Exercise. °· Sports. °· Lifting more than 3lb. °· Continue your normal diet. °· Wear a good support bra for as long as told by your doctor. °· Change any bandages (dressings) as told by your doctor. °· Do not drink alcohol while taking pain medicine. °· Keep all doctor visits as told. Ask when your test results will be ready. Make sure you get your test results. °GET HELP RIGHT AWAY IF:  °· You have a fever. °· You have more bleeding (more than a small spot) from the biopsy site. °· You have trouble breathing. °· You have yellowish-white fluid (pus) coming from the biopsy site. °· You have redness, puffiness (swelling), or more pain in the biopsy site. °· You have a bad smell coming from the biopsy site. °· Your biopsy site opens after sutures, staples, or sticky strips have been removed. °· You have a rash. °· You need stronger medicine. °MAKE SURE YOU: °· Understand these instructions. °· Will watch your condition. °· Will get help right away if you are not doing well or get worse. °Document Released: 07/19/2009 Document Revised: 12/15/2011 Document Reviewed: 11/02/2011 °ExitCare® Patient Information ©2015 [employer]. This information is not intended to replace advice given to you by your health care provider. Make sure you discuss any questions you have with your health care provider. ° °Breast Biopsy °A breast biopsy is a test during which a sample of  tissue is taken from your breast. The breast tissue is looked at under a microscope for cancer cells.  °BEFORE THE PROCEDURE °· Make plans to have someone drive you home after the test. °· Do not smoke for 2 weeks before the test. Stop smoking, if you smoke. °· Do not drink alcohol for 24 hours before the test. °· Wear a good support bra to the test. °PROCEDURE  °You may be given one of the following: °· A medicine to numb the breast area (local anesthetic). °· A medicine to make you fall asleep (general anesthetic). °There are different types of breast biopsies. They include: °· Fine-needle aspiration. °¨ A needle is put into the breast lump. °¨ The needle takes out fluid and cells from the lump. °¨ Ultrasound imaging may be used to help find the lump and to put the needle in the right spot. °· Core-needle biopsy. °¨ A needle is put into the breast lump. °¨ The needle is put in your breast 3-6 times. °¨ The needle removes breast tissue. °¨ An ultrasound image or X-ray is often used to find the right spot to put in the needle. °· Stereotactic biopsy. °¨ X-rays and a computer are used to study X-ray pictures of the breast lump. °¨ The computer finds where the needle needs to be put into the breast. °¨ Tissue samples are taken out. °· Vacuum-assisted biopsy. °¨ A small cut (incision) is made in your breast. °¨ A biopsy device is put through the cut and into the breast   tissue. °¨ The biopsy device draws abnormal breast tissue into the biopsy device. °¨ A large tissue sample is often removed. °¨ No stitches are needed. °· Ultrasound-guided core-needle biopsy. °¨ Ultrasound imaging helps guide the needle into the area of the breast that is not normal. °¨ A cut is made in the breast. The needle is put into the breast lump. °¨ Tissue samples are taken out. °· Open biopsy. °¨ A large cut is made in the breast. °¨ Your doctor will try to remove the whole breast lump or as much as possible. °All tissue, fluid, or cell samples  are looked at under a microscope.  °AFTER THE PROCEDURE °· You will be taken to an area to recover. You will be able to go home once you are doing well and are without problems. °· You may have bruising on your breast. This is normal. °· A pressure bandage (dressing) may be put on your breast for 24-48 hours. This type of bandage is wrapped tightly around your chest. It helps stop fluid from building up underneath tissues. °Document Released: 12/15/2011 Document Revised: 02/06/2014 Document Reviewed: 12/15/2011 °ExitCare® Patient Information ©2015 [employer]. This information is not intended to replace advice given to you by your health care provider. Make sure you discuss any questions you have with your health care provider. ° °

## 2015-03-26 ENCOUNTER — Telehealth (HOSPITAL_COMMUNITY): Payer: Self-pay | Admitting: Hematology & Oncology

## 2015-03-26 ENCOUNTER — Encounter (HOSPITAL_COMMUNITY): Payer: Self-pay | Admitting: Hematology & Oncology

## 2015-03-26 ENCOUNTER — Encounter (HOSPITAL_COMMUNITY): Payer: Medicare Other | Attending: Hematology & Oncology | Admitting: Hematology & Oncology

## 2015-03-26 VITALS — BP 124/47 | HR 84 | Temp 98.4°F | Resp 16 | Wt 108.7 lb

## 2015-03-26 DIAGNOSIS — C50912 Malignant neoplasm of unspecified site of left female breast: Secondary | ICD-10-CM | POA: Diagnosis present

## 2015-03-26 DIAGNOSIS — M40209 Unspecified kyphosis, site unspecified: Secondary | ICD-10-CM

## 2015-03-26 DIAGNOSIS — Z171 Estrogen receptor negative status [ER-]: Secondary | ICD-10-CM | POA: Diagnosis not present

## 2015-03-26 DIAGNOSIS — M4000 Postural kyphosis, site unspecified: Secondary | ICD-10-CM

## 2015-03-26 NOTE — Progress Notes (Signed)
[facility] NOTE  Patient Care Team: Moshe Cipro, MD as PCP - General (Internal Medicine)  CHIEF COMPLAINTS/PURPOSE OF CONSULTATION:  Left Breast Cancer Biopsy on 03/20/2015 with ER- PR+ HER2 neu- invasive ductal carcinoma   HISTORY OF PRESENTING ILLNESS:  [name] 81 y.o. female is here because of newly diagnosed left breast cancer. Her last mammogram was over 5 years ago as the patient notes she is unable to obtain them because of her back. She has significant kyphosis.  She is present today with all 4 of her daughters due to the concern of finding a knot on her breast, found by one of her daughters.  She says that she feels burning sensation in that breast. She lives with one daughter and a sitter, she has another daughter down the street who is also very involved in her care. Her daughters say that she is spoiled. She sleeps in an elevated bed on her back, says her side is uncomfortable.  She also has a lift chair and recliner. She has help getting dressed but eats and dials the telephone on her own.  Her daughters say that the knot has not been present long due to how often they touch and care for their mother and have not noticed it until the past several weeks. They deny weight loss or change in appetite.  Dr. Audie Box, Pulmonary doctor; asthma, diagnosed in her early 70's, well controlled with daily breathing treatment.  Her daughters say it only acts up if she get a cold or something similar. Dr. Alroy Dust, Cardiologist, visits once a year for tachycardia.  She denies any problems with this lately She cannot walk, she says that she feels a pop in her knee if she attempts to get up She has Rheumatoid arthritis; has never seen a Rheumatologist. Her daughters say that her bone density test could not be completed because she cannot lie flat   MEDICAL HISTORY:  Past Medical History  Diagnosis Date  . Asthma   . Hypertension   . Diabetes  mellitus without complication   . Acid reflux   . Kyphosis   . Gastric ulcer   . Raynaud disease   . Anemia   . Arthritis   . Cancer     SURGICAL HISTORY: Past Surgical History  Procedure Laterality Date  . Hernia repair    . Breast surgery    . Mastectomy      SOCIAL HISTORY: History   Social History  . Marital Status: Married    Spouse Name: N/A  . Number of Children: N/A  . Years of Education: N/A   Occupational History  . Not on file.   Social History Main Topics  . Smoking status: Never Smoker   . Smokeless tobacco: Never Used  . Alcohol Use: No  . Drug Use: No  . Sexual Activity: No   Other Topics Concern  . Not on file   Social History Narrative  4 daughters 8 grandchildren 50 great grandchildren Widowed Ex-cleaner Non smoker ETOH, none   FAMILY HISTORY: Family History  Problem Relation Age of Onset  . Asthma Sister   . Rheum arthritis Daughter    has no family status information on file.  Father deceased, 76 Mother deceased, young age 26 sisters deceased 2 half-sisters alive 33 brothers   ALLERGIES:  is allergic to aspirin.  MEDICATIONS:  Current Outpatient Prescriptions  Medication Sig Dispense Refill  . albuterol (PROVENTIL HFA) 108 (90 BASE) MCG/ACT  inhaler Inhale 2 puffs into the lungs every 6 (six) hours as needed. For shortness of breath    . albuterol (PROVENTIL) (2.5 MG/3ML) 0.083% nebulizer solution Take 2.5 mg by nebulization every 6 (six) hours as needed for wheezing or shortness of breath (Mix Albuterol with Ipratropium). With Ipratropium as needed for shortness of breath    . aspirin EC 81 MG tablet Take 81 mg by mouth 2 (two) times daily.     Marland Kitchen atorvastatin (LIPITOR) 20 MG tablet Take 20 mg by mouth at bedtime.    . budesonide-formoterol (SYMBICORT) 160-4.5 MCG/ACT inhaler Inhale 2 puffs into the lungs 2 (two) times daily.    . Cholecalciferol (VITAMIN D3) 1000 UNITS CAPS Take 1 capsule by mouth daily.    . fish  oil-omega-3 fatty acids 1000 MG capsule Take 1 g by mouth every morning.    Marland Kitchen ipratropium (ATROVENT) 0.02 % nebulizer solution Take 500 mcg by nebulization every 6 (six) hours as needed for wheezing or shortness of breath (*Mix Albuterol with Ipratropium). With albuterol as needed for shortness of breath    . linagliptin (TRADJENTA) 5 MG TABS tablet Take 5 mg by mouth every morning.    . metFORMIN (GLUCOPHAGE) 500 MG tablet Take 500-1,000 mg by mouth 2 (two) times daily. 1 in the morning and 2 at bedtime    . metoprolol succinate (TOPROL-XL) 25 MG 24 hr tablet Take 25 mg by mouth daily.    . Multiple Vitamins-Minerals (CENTRUM SILVER ADULT 50+ PO) Take 1 tablet by mouth every morning.     . ondansetron (ZOFRAN ODT) 4 MG disintegrating tablet 72m ODT q4 hours prn nausea/vomit 12 tablet 0  . pantoprazole (PROTONIX) 40 MG tablet Take 40 mg by mouth daily.    . Polyvinyl Alcohol-Povidone (CLEAR EYES ALL SEASONS) 5-6 MG/ML SOLN Apply 1 drop to eye every morning.     . predniSONE (DELTASONE) 5 MG tablet Take 5 mg by mouth daily with breakfast.    . raloxifene (EVISTA) 60 MG tablet Take 60 mg by mouth every morning.    . traMADol (ULTRAM) 50 MG tablet Take 50 mg by mouth every 6 (six) hours as needed. For pain    . chlorpheniramine-HYDROcodone (TUSSIONEX PENNKINETIC ER) 10-8 MG/5ML LQCR Take 2.5 mLs by mouth every 12 (twelve) hours as needed for cough. (Patient not taking: Reported on 10/03/2014) 100 mL 0   No current facility-administered medications for this visit.    Review of Systems  Constitutional: Negative for fever, chills, weight loss, malaise/fatigue and diaphoresis.  HENT: Positive for hearing loss. Negative for congestion, ear discharge, ear pain, nosebleeds, sore throat and tinnitus.   Eyes: Negative.   Respiratory: Negative.  Negative for stridor.   Cardiovascular: Negative.   Genitourinary: Negative.   Musculoskeletal: Positive for myalgias. Negative for back pain, joint pain and neck  pain.       Not ambulatory  Skin: Negative.   Neurological: Positive for weakness. Negative for dizziness, tingling, tremors, sensory change, speech change, focal weakness, seizures, loss of consciousness and headaches.  Endo/Heme/Allergies: Negative.   Psychiatric/Behavioral: Negative.   All other systems reviewed and are negative.  14 point ROS was done and is otherwise as detailed above or in HPI   PHYSICAL EXAMINATION: ECOG PERFORMANCE STATUS: 2 - Symptomatic, <50% confined to bed  Filed Vitals:   03/26/15 1527  BP: 124/47  Pulse: 84  Temp: 98.4 F (36.9 C)  Resp: 16   Filed Weights   03/26/15 1527  Weight: 108 lb  11.2 oz (49.306 kg)    Physical Exam  Constitutional: She is oriented to person, place, and time. No distress.  Frail appearing, in wheelchair, appropriate, "hunched over" (significant kyphosis) she is unable to raise her head. joint deformity of hands noted  HENT:  Head: Normocephalic and atraumatic.  Right Ear: External ear normal.  Left Ear: External ear normal.  Mouth/Throat: Oropharynx is clear and moist.  Eyes: Conjunctivae and EOM are normal. Pupils are equal, round, and reactive to light.  Neck: Normal range of motion. Neck supple. No tracheal deviation present. No thyromegaly present.  Cardiovascular: Normal rate and regular rhythm.  Exam reveals no gallop and no friction rub.   Pulmonary/Chest: Effort normal and breath sounds normal. No respiratory distress. She has no wheezes. She has no rales.    Abdominal: Soft. She exhibits no distension and no mass. There is no tenderness. There is no rebound and no guarding.  Exam limited secondary to patient's sitting in wheelchair, family and patient feel unable to get her onto the exam table  Musculoskeletal: She exhibits no edema.  Lymphadenopathy:    She has no cervical adenopathy.  Neurological: She is alert and oriented to person, place, and time. No cranial nerve deficit.  Skin: Skin is warm and  dry. She is not diaphoretic.  Psychiatric: Mood, memory, affect and judgment normal.  Nursing note and vitals reviewed.   LABORATORY DATA:  I have reviewed the data as listed Lab Results  Component Value Date   WBC 10.8* 10/03/2014   HGB 12.3 10/03/2014   HCT 40.2 10/03/2014   MCV 95.5 10/03/2014   PLT 190 10/03/2014    RADIOGRAPHIC STUDIES: I have personally reviewed the radiological images as listed and agreed with the findings in the report. CLINICAL DATA: Patient has a palpable firm mass in the left breast. Patient has a history of right mastectomy for breast carcinoma in 1979. This patient cannot be positioned for mammography.  EXAM: ULTRASOUND OF THE LEFT BREAST  COMPARISON: None  FINDINGS: On physical exam, there is a firm, relatively fixed, mass in the upper outer left breast.  Targeted ultrasound is performed, showing hypoechoic mass with irregular partly ill-defined margins in the 2 o'clock position of the left breast, 4 cm from the nipple, measuring 3.8 cm x 2.7 cm by 2.8 cm. In the left axilla there is a single mildly prominent lymph node measuring 7 mm in short axis with a cortex measuring 3.8 mm in thickness. This lymph node has normal morphology.  IMPRESSION: Large left breast mass likely a primary breast malignancy.  RECOMMENDATION: Ultrasound-guided core needle biopsy of a left breast mass.  I have discussed the findings and recommendations with the patient. Results were also provided in writing at the conclusion of the visit. If applicable, a reminder letter will be sent to the patient regarding the next appointment.  BI-RADS CATEGORY 4: Suspicious abnormality - biopsy should be considered.  Electronically Signed: By: Lajean Manes M.D. 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 We reviewed the different types of breast cancer in detail today. She has evidence of PR positive disease and may benefit somewhat from endocrine therapy (although certainly not to the degree as if she were ER+).   She is  a poor candidate for radiation given that she cannot lie flat. Local control is going to be a significant issue. I am going to refer her to surgery as I feel that IF she is a surgical candidate her tumor should be removed in order to achieve local control. She will not be a good candidate for systemic therapy. Her daughters are somewhat unrealistic at this point about their mother's overall health. I did address some of these issues with them today as well.  I have recommended a surgical referral. We will regroup after she meets with surgery and discuss additional treatment planning. They were provided with reading information today. They were provided contact information with our patient navigator. I answered all their questions and advised them to write additional questions down when they come back for follow-up.  All questions were answered. The patient knows to call the clinic with any problems, questions or concerns.   This note was electronically signed.   This document serves as a record of services personally performed by Ancil Linsey, MD. It was created on her behalf by Janace Hoard, a trained medical scribe. The creation of this record is based on the scribe's personal observations and the provider's statements to them. This document has been checked and approved by the attending provider.  I have reviewed the above documentation for accuracy and completeness, and I agree with the above.  Kelby Fam. Whitney Muse, MD

## 2015-03-26 NOTE — Telephone Encounter (Signed)
PER REQUEST OF THE FAMILY I CHANGED THE PT'S DOB FROM [birth year] TO 05/30/1926. STATE HAS PTS DOB AS 02/09/1926

## 2015-03-26 NOTE — Patient Instructions (Signed)
[facility] Discharge Instructions  RECOMMENDATIONS MADE BY THE CONSULTANT AND ANY TEST RESULTS WILL BE SENT TO YOUR REFERRING PHYSICIAN.  Exam and discussion by Dr. Whitney Muse. We don't know her HER2 status yet. Will refer to Dr. Arnoldo Morale for surgical consult.  Follow-up after surgery.  Thank you for choosing [facility] to provide your oncology and hematology care.  To afford each patient quality time with our provider, please arrive at least 15 minutes before your scheduled appointment time.    You need to re-schedule your appointment should you arrive 10 or more minutes late.  We strive to give you quality time with our providers, and arriving late affects you and other patients whose appointments are after yours.  Also, if you no show three or more times for appointments you may be dismissed from the clinic at the providers discretion.     Again, thank you for choosing [facility].  Our hope is that these requests will decrease the amount of time that you wait before being seen by our physicians.       _____________________________________________________________  Should you have questions after your visit to [facility], please contact our office at [phone] between the hours of 8:30 a.m. and 4:30 p.m.  Voicemails left after 4:30 p.m. will not be returned until the following business day.  For prescription refill requests, have your pharmacy contact our office.

## 2015-03-27 ENCOUNTER — Encounter (HOSPITAL_COMMUNITY): Payer: Self-pay | Admitting: Lab

## 2015-03-27 NOTE — Progress Notes (Signed)
Referral made to Dr Arnoldo Morale  appt 6/28 @11 :59.  Records faxed on 6/27.  Family is aware of appt.

## 2015-04-03 ENCOUNTER — Encounter (HOSPITAL_COMMUNITY)
Admission: RE | Admit: 2015-04-03 | Discharge: 2015-04-03 | Disposition: A | Discharge status: Home / Self Care | Payer: Medicare Other | Source: Ambulatory Visit | Attending: General Surgery | Admitting: General Surgery

## 2015-04-11 ENCOUNTER — Inpatient Hospital Stay: Admit: 2015-04-11 | Payer: Medicare Other | Admitting: General Surgery

## 2015-04-11 SURGERY — MASTECTOMY, MODIFIED RADICAL
Anesthesia: General | Laterality: Left

## 2015-04-12 ENCOUNTER — Ambulatory Visit: Payer: Self-pay | Admitting: Surgery

## 2015-04-12 NOTE — H&P (Signed)
[name] 04/12/2015 9:25 AM Location: [facility] Patient #: [id number] DOB: 31-Jul-1926 Widowed / Language: Cleophus Molt / Race: Black or African American Female History of Present Illness [name] A. Sabrina Pair MD; 04/12/2015 9:51 AM) Patient words: breast cancer   CLINICAL DATA: Patient has a palpable firm mass in the left breast. Patient has a history of right mastectomy for breast carcinoma in 1979. This patient cannot be positioned for mammography. EXAM: ULTRASOUND OF THE LEFT BREAST COMPARISON: None FINDINGS: On physical exam, there is a firm, relatively fixed, mass in the upper outer left breast. Targeted ultrasound is performed, showing hypoechoic mass with irregular partly ill-defined margins in the 2 o'clock position of the left breast, 4 cm from the nipple, measuring 3.8 cm x 2.7 cm by 2.8 cm. In the left axilla there is a single mildly prominent lymph node measuring 7 mm in short axis with a cortex measuring 3.8 mm in thickness. This lymph node has normal morphology. IMPRESSION: Large left breast mass likely a primary breast malignancy. RECOMMENDATION: Ultrasound-guided core needle biopsy of a left breast mass. I have discussed the findings and recommendations with the patient. Results were also provided in writing at the conclusion of the visit. If applicable, a reminder letter will be sent to the patient regarding the next appointment. BI-RADS CATEGORY 4: Suspicious abnormality - biopsy should be considered. Electronically Signed: By: Lajean Manes M.D. On: 03/20/2015 12:52   Result History US BREAST LTD UNI LEFT INC AXILLA (Order #[id number]) on 03/22/2015 - Order Result History Report <epic://OPTION/?[email or web address]>    Vitals Height Weight BMI (Calculated) 5' 2"  (1.575 m) 49.306 kg (108 lb 11.2 oz) 20.2  Interpretation Summary CLINICAL DATA: Patient has a palpable firm mass in the left breast. Patient has a history of right mastectomy for breast carcinoma in  1979. This patient cannot be positioned for mammography. EXAM: ULTRASOUND OF THE LEFT BREAST COMPARISON: None FINDINGS: On physical exam, there is a firm, relatively fixed, mass in the upper outer left breast. Targeted ultrasound is performed, showing hypoechoic mass with irregular partly ill-defined margins in the 2 o'clock position of the left breast, 4 cm from the nipple, measuring 3.8 cm x 2.7 cm by 2.8 cm. In the left axilla there is a single mildly prominent lymph node measuring 7 mm in short axis with a cortex measuring 3.8 mm in thickness. This lymph node has normal morphology. IMPRESSION: Large left breast mass likely a primary breast malignancy. RECOMMENDATION: Ultrasound-guided core needle biopsy of a left breast mass. I have discussed the findings and recommendations with the patient. Results were also provided in writing at the conclusion of the visit. If applicable, a reminder letter will be sent to the patient regarding the next appointment. BI-RADS CATEGORY 4: Suspicious abnormality - biopsy should be considered. Electronically Signed: By: Lajean Manes M.D. On: 03/20/2015 12:52  Addendum by Lajean Manes, MD on Thu Mar 22, 2015 10:43 AM ADDENDUM REPORT: 03/22/2015 10:40 ADDENDUM: Patient's prior mammography of her left breast from 2009 and 2010 have become available for correlation with the current ultrasound. The mass seen on ultrasound was not present on the prior mammograms. Electronically Signed By: Lajean Manes M.D. On: 03/22/2015 10:40                Patient: [name], [name] Collected: 03/20/2015 Client: [facility] Accession: [id number] Received: 03/20/2015 Lajean Manes, MD DOB: May 13, 1926 Age: 81 Gender: F Reported: 03/21/2015 [address] Patient Ph: [phone] MRN #: [id number] Linna Hoff, [place] [address] Visit #: [id number] Chart #:  Phone: Fax: CC: Ancil Linsey, MD REPORT OF SURGICAL PATHOLOGY ADDITIONAL  INFORMATION: FLUORESCENCE IN-SITU HYBRIDIZATION Results: HER2 - NEGATIVE RATIO OF HER2/CEP17 SIGNALS 1.54 AVERAGE HER2 COPY NUMBER PER CELL 3.55 Reference Range: NEGATIVE HER2/CEP17 Ratio <2.0 and average HER2 copy number <4.0 EQUIVOCAL HER2/CEP17 Ratio <2.0 and average HER2 copy number 4.0 and <6.0 POSITIVE HER2/CEP17 Ratio >=2.0 or <2.0 and average HER2 copy number >=6.0 Enid Cutter MD Pathologist, Electronic Signature ( Signed 03/27/2015) PROGNOSTIC INDICATORS - ACIS Results: IMMUNOHISTOCHEMICAL AND MORPHOMETRIC ANALYSIS BY THE AUTOMATED CELLULAR IMAGING SYSTEM (ACIS) Estrogen Receptor: 0%, NEGATIVE (PERFORMED MANUALLY) Progesterone Receptor: 1%, POSITIVE, STRONG STAINING INTENSITY (PERFORMED MANUALLY) Proliferation Marker Ki67: 30% (PERFORMED MANUALLY) COMMENT: The negative hormone receptor study(ies) in this case has an internal positive control. REFERENCE RANGE ESTROGEN RECEPTOR NEGATIVE <1% POSITIVE =>1% PROGESTERONE RECEPTOR 1 of 3 FINAL for [facility], [name] ([id number]) ADDITIONAL INFORMATION:(continued) NEGATIVE <1% POSITIVE =>1% All controls stained appropriately Mali RUND DO Pathologist, Electronic Signature ( Signed 03/22/2015) FINAL DIAGNOSIS Diagnosis Breast, left, needle core biopsy, upper outer quadrant - INVASIVE DUCTAL CARCINOMA. - LYMPHOVASCULAR INVASION IS IDENTIFIED. - SEE COMMENT. Microscopic Comment The carcinoma appears grade 3. A breast prognostic profile will be performed and the results reported separately. (JBK:gt, 03/21/15) Enid Cutter MD Pathologist, Electronic Signature (Case signed 03/21/2015) Specimen Gross and Clinical Information Specimen(s) Obtained: Breast, left, needle core biopsy, upper outer quadrant Specimen Clinical Information large left breast mass, hx of right mastectomy for cancer in 1979; should be [facility] Gross Received in formalin labeled left breast, and consists of four cores of tan yellow, focally hemorrhagic  fibroadipose tissue, ranging from 1.7 x 0.3 x 0.2 cm to 2.7 x 0.2 x 0.2 cm. The specimen is entirely submitted in one cassette. Time in formalin 12:12 p.m. on 03/20/15. Cold ischemic time 2 minutes. (KL:gt, 03/20/15) Stain(s) used in Diagnosis: The following stain(s) were used in diagnosing the case: Her2 FISH, ER-ACIS, PR-ACIS, KI-67-ACIS. The control(s) stained appropriately. Disclaimer HER2 IQFISH pharmDX (code [id number]) is a direct fluorescence in-situ hybridization assay designed to quantitatively determine HER2 gene amplification in formalin-fixed,.  The patient is a 81 year old female who presents with breast cancer. 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 Tumor markers include estrogen receptor negative, progesterone receptor negative and HER-2/neu negative. The patient was referred by the surgeon. Initial presentation was 1 month(s) ago for breast mass on self-examination, breast mass on medical examination and breast pain. Current diagnosis was determined by mammography and core needle biopsy. This problem has not been previously evaluated. This problem has not been previously treated. Other Problems Elbert Ewings, CMA; 04/12/2015 9:25 AM) Arthritis Asthma Breast Cancer Diabetes Mellitus Diverticulosis Gastric Ulcer Gastroesophageal Reflux Disease High blood pressure Home Oxygen Use Hypercholesterolemia Lump In Breast  Past Surgical History Elbert Ewings, CMA; 04/12/2015 9:25 AM) Breast Biopsy Left. Cataract Surgery Bilateral. Laparoscopic Inguinal Hernia Surgery Left. Mastectomy Right.  Diagnostic Studies History Elbert Ewings, [place]; 04/12/2015 9:25 AM) Colonoscopy 5-10 years ago Mammogram 1-3 years ago Pap Smear 1-5 years ago  Allergies Elbert Ewings, CMA; 04/12/2015 9:26 AM) Aspirin Low Strength *ANALGESICS - NonNarcotic*  Medication History Elbert Ewings, CMA; 04/12/2015 9:28 AM) Atorvastatin Calcium  (20MG Tablet, Oral) Active. Tradjenta (5MG Tablet, Oral) Active. Metoprolol Succinate ER (25MG Tablet ER 24HR, Oral) Active. Raloxifene HCl (60MG Tablet, Oral) Active. PredniSONE (5MG Tablet, Oral) Active. Pantoprazole Sodium (40MG Tablet DR, Oral) Active. MetFORMIN HCl ER (OSM) (500MG Tablet ER 24HR, Oral) Active. Ondansetron HCl (4MG Tablet, Oral) Active.  Medications Reconciled  Social History Elbert Ewings, [place]; 04/12/2015 9:25 AM) Caffeine use Coffee. No alcohol use No drug use Tobacco use Never smoker.  Family History Elbert Ewings, [place]; 04/12/2015 9:25 AM) Family history unknown First Degree Relatives  Pregnancy / Birth History Elbert Ewings, [place]; 04/12/2015 9:25 AM) Age at menarche 27 years. Age of menopause 31-60 Gravida 94 Maternal age 31-25 Para 4     Review of Systems Elbert Ewings CMA; 04/12/2015 9:25 AM) General Not Present- Appetite Loss, Chills, Fatigue, Fever, Night Sweats, Weight Gain and Weight Loss. Skin Not Present- Change in Wart/Mole, Dryness, Hives, Jaundice, New Lesions, Non-Healing Wounds, Rash and Ulcer. HEENT Present- Wears glasses/contact lenses. Not Present- Earache, Hearing Loss, Hoarseness, Nose Bleed, Oral Ulcers, Ringing in the Ears, Seasonal Allergies, Sinus Pain, Sore Throat, Visual Disturbances and Yellow Eyes. Respiratory Present- Chronic Cough. Not Present- Bloody sputum, Difficulty Breathing, Snoring and Wheezing. Cardiovascular Not Present- Chest Pain, Difficulty Breathing Lying Down, Leg Cramps, Palpitations, Rapid Heart Rate, Shortness of Breath and Swelling of Extremities. Gastrointestinal Present- Constipation. Not Present- Abdominal Pain, Bloating, Bloody Stool, Change in Bowel Habits, Chronic diarrhea, Difficulty Swallowing, Excessive gas, Gets full quickly at meals, Hemorrhoids, Indigestion, Nausea, Rectal Pain and Vomiting. Female Genitourinary Not Present- Frequency, Nocturia, Painful Urination, Pelvic Pain and  Urgency. Musculoskeletal Present- Joint Pain, Joint Stiffness and Muscle Weakness. Not Present- Back Pain, Muscle Pain and Swelling of Extremities. Neurological Present- Numbness, Tingling and Trouble walking. Not Present- Decreased Memory, Fainting, Headaches, Seizures, Tremor and Weakness. Psychiatric Not Present- Anxiety, Bipolar, Change in Sleep Pattern, Depression, Fearful and Frequent crying. Endocrine Present- Hot flashes. Not Present- Cold Intolerance, Excessive Hunger, Hair Changes, Heat Intolerance and New Diabetes. Hematology Not Present- Easy Bruising, Excessive bleeding, Gland problems, HIV and Persistent Infections.  Vitals Elbert Ewings CMA; 04/12/2015 9:29 AM) 04/12/2015 9:28 AM Weight: 108 lb Height: 63in Body Surface Area: 1.48 m Body Mass Index: 19.13 kg/m Temp.: 98.51F(Oral)  Pulse: 74 (Regular)  BP: 124/64 (Sitting, Left Arm, Standard)     Physical Exam (Gisele Pack A. Lateya Dauria MD; 04/12/2015 9:53 AM)  General Note: WHEELCHAIR BOUND VERY FRAIL WITH FAMILY   Head and Neck Head-normocephalic, atraumatic with no lesions or palpable masses. Trachea-midline. Thyroid Gland Characteristics - normal size and consistency.  Eye Eyeball - Bilateral-Extraocular movements intact. Sclera/Conjunctiva - Bilateral-No scleral icterus.  Chest and Lung Exam Chest and lung exam reveals -quiet, even and easy respiratory effort with no use of accessory muscles and on auscultation, normal breath sounds, no adventitious sounds and normal vocal resonance. Inspection Chest Wall - Normal. Back - normal.  Breast Note: RIGHT BREAST ABSENT 24 CM MOBILE MASS WITH SKIN CHANGES LEFT BREAST UPPER OUTER QUADRANT   Cardiovascular Cardiovascular examination reveals -normal heart sounds, regular rate and rhythm with no murmurs and normal pedal pulses bilaterally.  Neurologic Neurologic evaluation reveals -alert and oriented x 3 with no impairment of recent or remote  memory. Mental Status-Normal. Note: IN WHEELCHAIR UNABLE TO WALK   Lymphatic Head & Neck  General Head & Neck Lymphatics: Bilateral - Description - Normal. Axillary  General Axillary Region: Bilateral - Description - Normal. Tenderness - Non Tender.    Assessment & Plan (Sereena Marando A. Shamel Galyean MD; 04/12/2015 9:57 AM)  BREAST CANCER, STAGE 2, LEFT (174.9  C50.912) Impression: VERY FRAIL AND TRIPLE NEGATIVE TUMOR WITH LYMPHOVASCULAR INVASION PRESENT. WAS ON SCHEDULE AT [facility] BUT CANCELLED DUE TO ANESTHESIA CONCERNS? DON NOT FEEL LN REMOVAL OF ANY BENEFIT UNLESS CLINICALLY POSITIVE NODES ENCOUNTERED AND GIVEN MULTIPLE COMORBIDITIES AND VERY POOR HEALTH,  SHE WILL UNLIKELY RECEIVE ANY CHEMOTHERAPY. NOT SURE SHE WILL TOLERATE RADIATION THERAPY EITHER. POOR OPERATIVE CANDIDATE BUT THIS WILL ACHEIVE LOCAL CONTROL. FAMILY AWAERE OF PROGNOSTIC PANEL AND PT AT SIGNIFICANT RISK OF LOCOREGIONAL RECURRENCE AND METASTATIC POTENTIAL. THEY AGREE TO PROCEED Discussed treatment options for breast cancer to include breast conservation vs mastectomy with reconstruction. Pt has decided on mastectomy. Risk include bleeding, infection, flap necrosis, pain, numbness, recurrence, hematoma, other surgery needs. Pt understands and agrees to proceed.  Current Plans Pt Education - Breast Cancer: discussed with patient and provided information. Pt Education - Patient information: Breast cancer (The Basics): discussed with patient and provided information. Pt Education - Overview of the treatment of newly diagnosed, non-metastatic breast cancer: discussed with patient and provided information. Pt Education - CCS Mastectomy HCI

## 2015-04-18 ENCOUNTER — Encounter (HOSPITAL_COMMUNITY): Payer: Self-pay | Admitting: Hematology & Oncology

## 2015-05-01 ENCOUNTER — Encounter (HOSPITAL_COMMUNITY)
Admission: RE | Admit: 2015-05-01 | Discharge: 2015-05-01 | Disposition: A | Discharge status: Home / Self Care | Payer: Medicare Other | Source: Ambulatory Visit | Attending: Surgery | Admitting: Surgery

## 2015-05-01 ENCOUNTER — Other Ambulatory Visit (HOSPITAL_COMMUNITY): Payer: Self-pay | Admitting: *Deleted

## 2015-05-01 ENCOUNTER — Encounter (HOSPITAL_COMMUNITY): Payer: Self-pay

## 2015-05-01 DIAGNOSIS — Z01818 Encounter for other preprocedural examination: Secondary | ICD-10-CM | POA: Insufficient documentation

## 2015-05-01 DIAGNOSIS — M069 Rheumatoid arthritis, unspecified: Secondary | ICD-10-CM | POA: Insufficient documentation

## 2015-05-01 DIAGNOSIS — Z79899 Other long term (current) drug therapy: Secondary | ICD-10-CM | POA: Diagnosis not present

## 2015-05-01 DIAGNOSIS — I10 Essential (primary) hypertension: Secondary | ICD-10-CM | POA: Insufficient documentation

## 2015-05-01 DIAGNOSIS — C50912 Malignant neoplasm of unspecified site of left female breast: Secondary | ICD-10-CM | POA: Diagnosis present

## 2015-05-01 DIAGNOSIS — Z01812 Encounter for preprocedural laboratory examination: Secondary | ICD-10-CM | POA: Diagnosis not present

## 2015-05-01 DIAGNOSIS — Z7982 Long term (current) use of aspirin: Secondary | ICD-10-CM | POA: Insufficient documentation

## 2015-05-01 DIAGNOSIS — M1990 Unspecified osteoarthritis, unspecified site: Secondary | ICD-10-CM | POA: Diagnosis not present

## 2015-05-01 DIAGNOSIS — K5790 Diverticulosis of intestine, part unspecified, without perforation or abscess without bleeding: Secondary | ICD-10-CM | POA: Diagnosis not present

## 2015-05-01 DIAGNOSIS — J45909 Unspecified asthma, uncomplicated: Secondary | ICD-10-CM | POA: Diagnosis not present

## 2015-05-01 DIAGNOSIS — K219 Gastro-esophageal reflux disease without esophagitis: Secondary | ICD-10-CM | POA: Diagnosis not present

## 2015-05-01 DIAGNOSIS — Z886 Allergy status to analgesic agent status: Secondary | ICD-10-CM | POA: Diagnosis not present

## 2015-05-01 DIAGNOSIS — E119 Type 2 diabetes mellitus without complications: Secondary | ICD-10-CM | POA: Insufficient documentation

## 2015-05-01 DIAGNOSIS — Z9981 Dependence on supplemental oxygen: Secondary | ICD-10-CM | POA: Diagnosis not present

## 2015-05-01 DIAGNOSIS — Z7952 Long term (current) use of systemic steroids: Secondary | ICD-10-CM | POA: Diagnosis not present

## 2015-05-01 DIAGNOSIS — Z853 Personal history of malignant neoplasm of breast: Secondary | ICD-10-CM | POA: Diagnosis not present

## 2015-05-01 DIAGNOSIS — Z9011 Acquired absence of right breast and nipple: Secondary | ICD-10-CM | POA: Diagnosis not present

## 2015-05-01 DIAGNOSIS — I739 Peripheral vascular disease, unspecified: Secondary | ICD-10-CM | POA: Diagnosis not present

## 2015-05-01 DIAGNOSIS — E780 Pure hypercholesterolemia: Secondary | ICD-10-CM | POA: Diagnosis not present

## 2015-05-01 LAB — COMPREHENSIVE METABOLIC PANEL
ALBUMIN: 3.2 g/dL — AB (ref 3.5–5.0)
ALT: 19 U/L (ref 14–54)
AST: 24 U/L (ref 15–41)
Alkaline Phosphatase: 59 U/L (ref 38–126)
Anion gap: 7 (ref 5–15)
BUN: 7 mg/dL (ref 6–20)
CALCIUM: 9.1 mg/dL (ref 8.9–10.3)
CO2: 31 mmol/L (ref 22–32)
Chloride: 98 mmol/L — ABNORMAL LOW (ref 101–111)
Creatinine, Ser: 0.79 mg/dL (ref 0.44–1.00)
Glucose, Bld: 290 mg/dL — ABNORMAL HIGH (ref 65–99)
Potassium: 4.1 mmol/L (ref 3.5–5.1)
SODIUM: 136 mmol/L (ref 135–145)
TOTAL PROTEIN: 6.1 g/dL — AB (ref 6.5–8.1)
Total Bilirubin: 0.5 mg/dL (ref 0.3–1.2)

## 2015-05-01 LAB — CBC WITH DIFFERENTIAL/PLATELET
Basophils Absolute: 0 10*3/uL (ref 0.0–0.1)
Basophils Relative: 0 % (ref 0–1)
Eosinophils Absolute: 0.1 10*3/uL (ref 0.0–0.7)
Eosinophils Relative: 1 % (ref 0–5)
HEMATOCRIT: 38.6 % (ref 36.0–46.0)
Hemoglobin: 12.1 g/dL (ref 12.0–15.0)
LYMPHS PCT: 11 % — AB (ref 12–46)
Lymphs Abs: 1.4 10*3/uL (ref 0.7–4.0)
MCH: 29.1 pg (ref 26.0–34.0)
MCHC: 31.3 g/dL (ref 30.0–36.0)
MCV: 92.8 fL (ref 78.0–100.0)
Monocytes Absolute: 0.6 10*3/uL (ref 0.1–1.0)
Monocytes Relative: 5 % (ref 3–12)
Neutro Abs: 10.4 10*3/uL — ABNORMAL HIGH (ref 1.7–7.7)
Neutrophils Relative %: 83 % — ABNORMAL HIGH (ref 43–77)
PLATELETS: 212 10*3/uL (ref 150–400)
RBC: 4.16 MIL/uL (ref 3.87–5.11)
RDW: 13.3 % (ref 11.5–15.5)
WBC: 12.5 10*3/uL — ABNORMAL HIGH (ref 4.0–10.5)

## 2015-05-01 LAB — GLUCOSE, CAPILLARY: Glucose-Capillary: 252 mg/dL — ABNORMAL HIGH (ref 65–99)

## 2015-05-01 NOTE — Pre-Procedure Instructions (Signed)
    Sueanne Kosman  05/01/2015      Your procedure is scheduled on Wednesday, May 09, 2015 at 10:45 AM.   Report to [facility] Entrance "A" Admitting Office at 8:45 AM.   Call this number if you have problems the morning of surgery: [phone]   Any questions prior to day of surgery, please call [phone] between 8 & 4 PM.   Remember:  Do not eat food or drink liquids after midnight Tuesday, 05/08/15.  Take these medicines the morning of surgery with A SIP OF WATER: Metoprolol, Protonix, Prednisone, Evista, Symbicort inhaler,Tramadol - if needed, Albuterol inhaler - if needed, Albuterol nebulizer - if needed, Atrovent nebulizer - if needed.  Stop Aspirin, Vitamins, Fish Oil and NSAIDS (Ibuprofen, Advil, Aleve, etc.) as of today. Do NOT take diabetic medications the morning of surgery   Do not wear jewelry, make-up or nail polish.  Do not wear lotions, powders, or perfumes.  You may wear deodorant.  Do not shave 48 hours prior to surgery.    Do not bring valuables to the hospital.  [facility] is not responsible for any belongings or valuables.  Contacts, dentures or bridgework may not be worn into surgery.  Leave your suitcase in the car.  After surgery it may be brought to your room.  For patients admitted to the hospital, discharge time will be determined by your treatment team.  Special instructions:  See "Preparing for Surgery" Instruction sheet.   Please read over the following fact sheets that you were given. Pain Booklet, Coughing and Deep Breathing and Surgical Site Infection Prevention

## 2015-05-02 LAB — HEMOGLOBIN A1C
Hgb A1c MFr Bld: 8.2 % — ABNORMAL HIGH (ref 4.8–5.6)
MEAN PLASMA GLUCOSE: 189 mg/dL

## 2015-05-02 NOTE — Progress Notes (Signed)
Anesthesia Chart Review:  Pt is 81 year old female scheduled for L simple mastectomy on 05/09/2015 with Dr. Brantley Stage.   Cardiologist is Dr. Delanna Notice, last office visit 09/22/14.   PMH includes: HTN, DM, Raynaud disease, asthma, anemia, GERD, breast cancer, RA. Never smoker. BMI 19.   Medications include: Albuterol, ASA, lipitor, symbicort, atrovent, linaglipitin, metformin, metoprolol, protonix, prednisone, raloxifene.  Preoperative labs reviewed.  Glucose 290. HgbA1c 8.2.   EKG 10/03/2014: Sinus rhythm. Consider RVH or posterior infarct. LVH.   By Dr. Milta Deiters notes, pt seems to have had STEMI in 2004 followed by cath showing EF 50%. No other details in notes.   Reviewed with Dr. Orene Desanctis. Will repeat EKG DOS.   If EKG acceptable, I anticipate pt can proceed as scheduled.   Willeen Cass, FNP-BC [facility]/Anesthesiology Phone: [phone] 05/02/2015 4:57 PM

## 2015-05-08 MED ORDER — DEXTROSE 5 % IV SOLN
3.0000 g | INTRAVENOUS | Status: AC
  Administered 2015-05-09: 3 g via INTRAVENOUS
  Filled 2015-05-08: qty 3000

## 2015-05-09 ENCOUNTER — Encounter (HOSPITAL_COMMUNITY): Admission: RE | Disposition: A | Discharge status: Home / Self Care | Payer: Self-pay | Source: Ambulatory Visit

## 2015-05-09 ENCOUNTER — Ambulatory Visit (HOSPITAL_COMMUNITY): Payer: Medicare Other | Admitting: Emergency Medicine

## 2015-05-09 ENCOUNTER — Ambulatory Visit (HOSPITAL_COMMUNITY): Payer: Medicare Other | Admitting: Anesthesiology

## 2015-05-09 ENCOUNTER — Observation Stay (HOSPITAL_COMMUNITY)
Admission: RE | Admit: 2015-05-09 | Discharge: 2015-05-10 | Disposition: A | Discharge status: Home / Self Care | Payer: Medicare Other | Source: Ambulatory Visit | Attending: General Surgery | Admitting: General Surgery

## 2015-05-09 ENCOUNTER — Encounter (HOSPITAL_COMMUNITY): Payer: Self-pay | Admitting: *Deleted

## 2015-05-09 DIAGNOSIS — K5790 Diverticulosis of intestine, part unspecified, without perforation or abscess without bleeding: Secondary | ICD-10-CM | POA: Diagnosis not present

## 2015-05-09 DIAGNOSIS — I739 Peripheral vascular disease, unspecified: Secondary | ICD-10-CM | POA: Insufficient documentation

## 2015-05-09 DIAGNOSIS — Z886 Allergy status to analgesic agent status: Secondary | ICD-10-CM | POA: Insufficient documentation

## 2015-05-09 DIAGNOSIS — Z9011 Acquired absence of right breast and nipple: Secondary | ICD-10-CM | POA: Insufficient documentation

## 2015-05-09 DIAGNOSIS — J45909 Unspecified asthma, uncomplicated: Secondary | ICD-10-CM | POA: Diagnosis not present

## 2015-05-09 DIAGNOSIS — Z7952 Long term (current) use of systemic steroids: Secondary | ICD-10-CM | POA: Insufficient documentation

## 2015-05-09 DIAGNOSIS — E780 Pure hypercholesterolemia: Secondary | ICD-10-CM | POA: Insufficient documentation

## 2015-05-09 DIAGNOSIS — C50912 Malignant neoplasm of unspecified site of left female breast: Secondary | ICD-10-CM | POA: Diagnosis not present

## 2015-05-09 DIAGNOSIS — Z79899 Other long term (current) drug therapy: Secondary | ICD-10-CM | POA: Insufficient documentation

## 2015-05-09 DIAGNOSIS — I10 Essential (primary) hypertension: Secondary | ICD-10-CM | POA: Insufficient documentation

## 2015-05-09 DIAGNOSIS — K219 Gastro-esophageal reflux disease without esophagitis: Secondary | ICD-10-CM | POA: Insufficient documentation

## 2015-05-09 DIAGNOSIS — M1990 Unspecified osteoarthritis, unspecified site: Secondary | ICD-10-CM | POA: Insufficient documentation

## 2015-05-09 DIAGNOSIS — Z9981 Dependence on supplemental oxygen: Secondary | ICD-10-CM | POA: Insufficient documentation

## 2015-05-09 DIAGNOSIS — Z853 Personal history of malignant neoplasm of breast: Secondary | ICD-10-CM | POA: Insufficient documentation

## 2015-05-09 DIAGNOSIS — E119 Type 2 diabetes mellitus without complications: Secondary | ICD-10-CM | POA: Diagnosis not present

## 2015-05-09 DIAGNOSIS — C50412 Malignant neoplasm of upper-outer quadrant of left female breast: Secondary | ICD-10-CM | POA: Diagnosis present

## 2015-05-09 HISTORY — PX: SIMPLE MASTECTOMY WITH AXILLARY SENTINEL NODE BIOPSY: SHX6098

## 2015-05-09 LAB — GLUCOSE, CAPILLARY
GLUCOSE-CAPILLARY: 210 mg/dL — AB (ref 65–99)
GLUCOSE-CAPILLARY: 128 mg/dL — AB (ref 65–99)
Glucose-Capillary: 159 mg/dL — ABNORMAL HIGH (ref 65–99)
Glucose-Capillary: 223 mg/dL — ABNORMAL HIGH (ref 65–99)
Glucose-Capillary: 233 mg/dL — ABNORMAL HIGH (ref 65–99)

## 2015-05-09 SURGERY — SIMPLE MASTECTOMY
Anesthesia: General | Site: Chest | Laterality: Left

## 2015-05-09 MED ORDER — ALBUTEROL SULFATE (2.5 MG/3ML) 0.083% IN NEBU: 2.5000 mg | INHALATION_SOLUTION | Freq: Four times a day (QID) | RESPIRATORY_TRACT | Status: DC | PRN

## 2015-05-09 MED ORDER — PHENYLEPHRINE 40 MCG/ML (10ML) SYRINGE FOR IV PUSH (FOR BLOOD PRESSURE SUPPORT)
PREFILLED_SYRINGE | INTRAVENOUS | Status: AC
  Filled 2015-05-09: qty 20

## 2015-05-09 MED ORDER — ACETAMINOPHEN 325 MG PO TABS: 650.0000 mg | ORAL_TABLET | Freq: Four times a day (QID) | ORAL | Status: DC | PRN

## 2015-05-09 MED ORDER — METFORMIN HCL 500 MG PO TABS
500.0000 mg | ORAL_TABLET | Freq: Every day | ORAL | Status: DC
  Administered 2015-05-10: 500 mg via ORAL
  Filled 2015-05-09: qty 1

## 2015-05-09 MED ORDER — ALBUTEROL SULFATE HFA 108 (90 BASE) MCG/ACT IN AERS
INHALATION_SPRAY | RESPIRATORY_TRACT | Status: DC | PRN
  Administered 2015-05-09: 2 via RESPIRATORY_TRACT

## 2015-05-09 MED ORDER — ONDANSETRON HCL 4 MG/2ML IJ SOLN
INTRAMUSCULAR | Status: DC | PRN
Start: 2015-05-09 — End: 2015-05-09
  Administered 2015-05-09: 4 mg via INTRAVENOUS

## 2015-05-09 MED ORDER — HEMOSTATIC AGENTS (NO CHARGE) OPTIME
TOPICAL | Status: DC | PRN
  Administered 2015-05-09: 1 via TOPICAL

## 2015-05-09 MED ORDER — ALBUTEROL SULFATE (2.5 MG/3ML) 0.083% IN NEBU: 2.5000 mg | INHALATION_SOLUTION | Freq: Every day | RESPIRATORY_TRACT | Status: DC

## 2015-05-09 MED ORDER — BUPIVACAINE-EPINEPHRINE (PF) 0.5% -1:200000 IJ SOLN
INTRAMUSCULAR | Status: DC | PRN
  Administered 2015-05-09: 25 mL

## 2015-05-09 MED ORDER — CHLORHEXIDINE GLUCONATE 4 % EX LIQD: 1.0000 "application " | Freq: Once | CUTANEOUS | Status: DC

## 2015-05-09 MED ORDER — MIDAZOLAM HCL 2 MG/2ML IJ SOLN
INTRAMUSCULAR | Status: AC
  Administered 2015-05-09: 1 mg via INTRAVENOUS
  Filled 2015-05-09: qty 2

## 2015-05-09 MED ORDER — LACTATED RINGERS IV SOLN
INTRAVENOUS | Status: DC | PRN
  Administered 2015-05-09 (×2): via INTRAVENOUS

## 2015-05-09 MED ORDER — BUDESONIDE-FORMOTEROL FUMARATE 160-4.5 MCG/ACT IN AERO
2.0000 | INHALATION_SPRAY | Freq: Two times a day (BID) | RESPIRATORY_TRACT | Status: DC
  Administered 2015-05-09 – 2015-05-10 (×2): 2 via RESPIRATORY_TRACT
  Filled 2015-05-09: qty 6

## 2015-05-09 MED ORDER — NEOSTIGMINE METHYLSULFATE 10 MG/10ML IV SOLN
INTRAVENOUS | Status: DC | PRN
  Administered 2015-05-09: 3 mg via INTRAVENOUS

## 2015-05-09 MED ORDER — PREDNISONE 5 MG PO TABS
5.0000 mg | ORAL_TABLET | Freq: Every day | ORAL | Status: DC
  Administered 2015-05-10: 5 mg via ORAL
  Filled 2015-05-09: qty 1

## 2015-05-09 MED ORDER — PANTOPRAZOLE SODIUM 40 MG PO TBEC
40.0000 mg | DELAYED_RELEASE_TABLET | Freq: Every day | ORAL | Status: DC
  Administered 2015-05-09: 40 mg via ORAL
  Filled 2015-05-09: qty 1

## 2015-05-09 MED ORDER — FENTANYL CITRATE (PF) 100 MCG/2ML IJ SOLN
INTRAMUSCULAR | Status: AC
  Administered 2015-05-09: 50 ug via INTRAVENOUS
  Filled 2015-05-09: qty 2

## 2015-05-09 MED ORDER — ONDANSETRON 4 MG PO TBDP
4.0000 mg | ORAL_TABLET | Freq: Four times a day (QID) | ORAL | Status: DC | PRN
  Filled 2015-05-09: qty 1

## 2015-05-09 MED ORDER — ONDANSETRON 4 MG PO TBDP: 4.0000 mg | ORAL_TABLET | ORAL | Status: DC | PRN

## 2015-05-09 MED ORDER — FENTANYL CITRATE (PF) 100 MCG/2ML IJ SOLN
100.0000 ug | Freq: Once | INTRAMUSCULAR | Status: AC
  Administered 2015-05-09: 50 ug via INTRAVENOUS

## 2015-05-09 MED ORDER — RALOXIFENE HCL 60 MG PO TABS
60.0000 mg | ORAL_TABLET | Freq: Every morning | ORAL | Status: DC
  Filled 2015-05-09: qty 1

## 2015-05-09 MED ORDER — METFORMIN HCL 500 MG PO TABS: 500.0000 mg | ORAL_TABLET | Freq: Two times a day (BID) | ORAL | Status: DC

## 2015-05-09 MED ORDER — EPHEDRINE SULFATE 50 MG/ML IJ SOLN
INTRAMUSCULAR | Status: DC | PRN
  Administered 2015-05-09 (×2): 5 mg via INTRAVENOUS

## 2015-05-09 MED ORDER — POLYVINYL ALCOHOL-POVIDONE 5-6 MG/ML OP SOLN: 1.0000 [drp] | Freq: Every morning | OPHTHALMIC | Status: DC

## 2015-05-09 MED ORDER — OXYCODONE HCL 5 MG PO TABS: 5.0000 mg | ORAL_TABLET | ORAL | Status: DC | PRN

## 2015-05-09 MED ORDER — TRAMADOL HCL 50 MG PO TABS: 50.0000 mg | ORAL_TABLET | Freq: Four times a day (QID) | ORAL | Status: DC | PRN

## 2015-05-09 MED ORDER — LIDOCAINE HCL (CARDIAC) 20 MG/ML IV SOLN
INTRAVENOUS | Status: DC | PRN
  Administered 2015-05-09: 60 mg via INTRAVENOUS

## 2015-05-09 MED ORDER — INSULIN ASPART 100 UNIT/ML ~~LOC~~ SOLN
0.0000 [IU] | Freq: Three times a day (TID) | SUBCUTANEOUS | Status: DC
  Administered 2015-05-09: 5 [IU] via SUBCUTANEOUS
  Administered 2015-05-10: 3 [IU] via SUBCUTANEOUS

## 2015-05-09 MED ORDER — GLYCOPYRROLATE 0.2 MG/ML IJ SOLN
INTRAMUSCULAR | Status: DC | PRN
  Administered 2015-05-09: 0.4 mg via INTRAVENOUS

## 2015-05-09 MED ORDER — IPRATROPIUM BROMIDE 0.02 % IN SOLN
500.0000 ug | Freq: Every day | RESPIRATORY_TRACT | Status: DC
  Administered 2015-05-09: 500 ug via RESPIRATORY_TRACT
  Filled 2015-05-09: qty 2.5

## 2015-05-09 MED ORDER — HYDROMORPHONE HCL 1 MG/ML IJ SOLN: 0.5000 mg | INTRAMUSCULAR | Status: DC | PRN

## 2015-05-09 MED ORDER — ROCURONIUM BROMIDE 100 MG/10ML IV SOLN
INTRAVENOUS | Status: DC | PRN
  Administered 2015-05-09: 20 mg via INTRAVENOUS

## 2015-05-09 MED ORDER — ATORVASTATIN CALCIUM 20 MG PO TABS
20.0000 mg | ORAL_TABLET | Freq: Every day | ORAL | Status: DC
  Administered 2015-05-09: 20 mg via ORAL
  Filled 2015-05-09: qty 1

## 2015-05-09 MED ORDER — EPHEDRINE SULFATE 50 MG/ML IJ SOLN
INTRAMUSCULAR | Status: AC
  Filled 2015-05-09: qty 1

## 2015-05-09 MED ORDER — FENTANYL CITRATE (PF) 100 MCG/2ML IJ SOLN
INTRAMUSCULAR | Status: DC | PRN
  Administered 2015-05-09 (×5): 50 ug via INTRAVENOUS

## 2015-05-09 MED ORDER — LINAGLIPTIN 5 MG PO TABS: 5.0000 mg | ORAL_TABLET | Freq: Every morning | ORAL | Status: DC

## 2015-05-09 MED ORDER — METFORMIN HCL 500 MG PO TABS
1000.0000 mg | ORAL_TABLET | Freq: Every day | ORAL | Status: DC
  Administered 2015-05-09: 1000 mg via ORAL
  Filled 2015-05-09: qty 2

## 2015-05-09 MED ORDER — PROPOFOL 10 MG/ML IV BOLUS
INTRAVENOUS | Status: DC | PRN
  Administered 2015-05-09: 150 mg via INTRAVENOUS

## 2015-05-09 MED ORDER — ONDANSETRON HCL 4 MG/2ML IJ SOLN: 4.0000 mg | Freq: Four times a day (QID) | INTRAMUSCULAR | Status: DC | PRN

## 2015-05-09 MED ORDER — STERILE WATER FOR INJECTION IJ SOLN
INTRAMUSCULAR | Status: AC
  Filled 2015-05-09: qty 10

## 2015-05-09 MED ORDER — DEXTROSE-NACL 5-0.9 % IV SOLN
INTRAVENOUS | Status: DC
  Administered 2015-05-09: 18:00:00 via INTRAVENOUS

## 2015-05-09 MED ORDER — POLYVINYL ALCOHOL 1.4 % OP SOLN
1.0000 [drp] | OPHTHALMIC | Status: DC | PRN
  Filled 2015-05-09: qty 15

## 2015-05-09 MED ORDER — PROPOFOL 10 MG/ML IV BOLUS
INTRAVENOUS | Status: AC
  Filled 2015-05-09: qty 20

## 2015-05-09 MED ORDER — MIDAZOLAM HCL 2 MG/2ML IJ SOLN
2.0000 mg | Freq: Once | INTRAMUSCULAR | Status: AC
  Administered 2015-05-09: 1 mg via INTRAVENOUS

## 2015-05-09 MED ORDER — 0.9 % SODIUM CHLORIDE (POUR BTL) OPTIME
TOPICAL | Status: DC | PRN
  Administered 2015-05-09: 1000 mL

## 2015-05-09 MED ORDER — INSULIN ASPART 100 UNIT/ML ~~LOC~~ SOLN: 0.0000 [IU] | Freq: Every day | SUBCUTANEOUS | Status: DC

## 2015-05-09 MED ORDER — PHENYLEPHRINE HCL 10 MG/ML IJ SOLN
INTRAMUSCULAR | Status: DC | PRN
  Administered 2015-05-09 (×4): 80 ug via INTRAVENOUS

## 2015-05-09 MED ORDER — VITAMIN D3 25 MCG (1000 UNIT) PO TABS
1.0000 [IU] | ORAL_TABLET | Freq: Every day | ORAL | Status: DC
  Administered 2015-05-09: 500 [IU] via ORAL
  Filled 2015-05-09 (×3): qty 1

## 2015-05-09 MED ORDER — ENOXAPARIN SODIUM 30 MG/0.3ML ~~LOC~~ SOLN
30.0000 mg | SUBCUTANEOUS | Status: DC
  Administered 2015-05-10: 30 mg via SUBCUTANEOUS
  Filled 2015-05-09: qty 0.3

## 2015-05-09 MED ORDER — ACETAMINOPHEN 650 MG RE SUPP: 650.0000 mg | Freq: Four times a day (QID) | RECTAL | Status: DC | PRN

## 2015-05-09 MED ORDER — METOPROLOL SUCCINATE ER 25 MG PO TB24
25.0000 mg | ORAL_TABLET | Freq: Every day | ORAL | Status: DC
  Administered 2015-05-09: 25 mg via ORAL
  Filled 2015-05-09: qty 1

## 2015-05-09 MED ORDER — CEFAZOLIN SODIUM-DEXTROSE 2-3 GM-% IV SOLR
2.0000 g | Freq: Three times a day (TID) | INTRAVENOUS | Status: AC
  Administered 2015-05-09: 2 g via INTRAVENOUS
  Filled 2015-05-09: qty 50

## 2015-05-09 MED ORDER — FENTANYL CITRATE (PF) 250 MCG/5ML IJ SOLN
INTRAMUSCULAR | Status: AC
  Filled 2015-05-09: qty 5

## 2015-05-09 MED ORDER — LACTATED RINGERS IV SOLN
INTRAVENOUS | Status: DC
  Administered 2015-05-09: 09:00:00 via INTRAVENOUS

## 2015-05-09 SURGICAL SUPPLY — 51 items
APPLIER CLIP 9.375 MED OPEN (MISCELLANEOUS) ×3
BINDER BREAST LRG (GAUZE/BANDAGES/DRESSINGS) ×2 IMPLANT
BINDER BREAST XLRG (GAUZE/BANDAGES/DRESSINGS) IMPLANT
CANISTER SUCTION 2500CC (MISCELLANEOUS) ×3 IMPLANT
CHLORAPREP W/TINT 26ML (MISCELLANEOUS) ×3 IMPLANT
CLIP APPLIE 9.375 MED OPEN (MISCELLANEOUS) ×1 IMPLANT
COVER SURGICAL LIGHT HANDLE (MISCELLANEOUS) ×3 IMPLANT
DERMABOND ADHESIVE PROPEN (GAUZE/BANDAGES/DRESSINGS) ×2
DERMABOND ADVANCED .7 DNX6 (GAUZE/BANDAGES/DRESSINGS) ×1 IMPLANT
DEVICE DISSECT PLASMABLAD 3.0S (MISCELLANEOUS) ×1 IMPLANT
DRAIN CHANNEL 19F RND (DRAIN) ×3 IMPLANT
DRAPE LAPAROSCOPIC ABDOMINAL (DRAPES) ×3 IMPLANT
DRAPE UTILITY XL STRL (DRAPES) ×6 IMPLANT
DRESSING ALLEVYN LIFE SACRUM (GAUZE/BANDAGES/DRESSINGS) ×2 IMPLANT
ELECT CAUTERY BLADE 6.4 (BLADE) ×3 IMPLANT
ELECT REM PT RETURN 9FT ADLT (ELECTROSURGICAL) ×3
ELECTRODE REM PT RTRN 9FT ADLT (ELECTROSURGICAL) ×1 IMPLANT
EVACUATOR SILICONE 100CC (DRAIN) ×3 IMPLANT
GAUZE SPONGE 4X4 12PLY STRL (GAUZE/BANDAGES/DRESSINGS) ×3 IMPLANT
GLOVE BIO SURGEON STRL SZ7 (GLOVE) ×2 IMPLANT
GLOVE BIO SURGEON STRL SZ7.5 (GLOVE) ×2 IMPLANT
GLOVE BIO SURGEON STRL SZ8 (GLOVE) ×3 IMPLANT
GLOVE BIOGEL PI IND STRL 6.5 (GLOVE) IMPLANT
GLOVE BIOGEL PI IND STRL 7.0 (GLOVE) IMPLANT
GLOVE BIOGEL PI IND STRL 7.5 (GLOVE) IMPLANT
GLOVE BIOGEL PI IND STRL 8 (GLOVE) ×1 IMPLANT
GLOVE BIOGEL PI INDICATOR 6.5 (GLOVE) ×2
GLOVE BIOGEL PI INDICATOR 7.0 (GLOVE) ×2
GLOVE BIOGEL PI INDICATOR 7.5 (GLOVE) ×2
GLOVE BIOGEL PI INDICATOR 8 (GLOVE) ×2
GOWN STRL REUS W/ TWL LRG LVL3 (GOWN DISPOSABLE) ×2 IMPLANT
GOWN STRL REUS W/ TWL XL LVL3 (GOWN DISPOSABLE) ×1 IMPLANT
GOWN STRL REUS W/TWL LRG LVL3 (GOWN DISPOSABLE) ×4
GOWN STRL REUS W/TWL XL LVL3 (GOWN DISPOSABLE) ×2
HEMOSTAT SNOW SURGICEL 2X4 (HEMOSTASIS) ×2 IMPLANT
KIT BASIN OR (CUSTOM PROCEDURE TRAY) ×3 IMPLANT
KIT ROOM TURNOVER OR (KITS) ×3 IMPLANT
LIQUID BAND (GAUZE/BANDAGES/DRESSINGS) ×2 IMPLANT
NS IRRIG 1000ML POUR BTL (IV SOLUTION) ×3 IMPLANT
PACK GENERAL/GYN (CUSTOM PROCEDURE TRAY) ×3 IMPLANT
PAD ABD 8X10 STRL (GAUZE/BANDAGES/DRESSINGS) ×2 IMPLANT
PAD ARMBOARD 7.5X6 YLW CONV (MISCELLANEOUS) ×3 IMPLANT
PLASMABLADE 3.0S (MISCELLANEOUS) ×3
SPECIMEN JAR X LARGE (MISCELLANEOUS) ×3 IMPLANT
SPONGE GAUZE 4X4 12PLY STER LF (GAUZE/BANDAGES/DRESSINGS) ×2 IMPLANT
SUT ETHILON 3 0 FSL (SUTURE) ×3 IMPLANT
SUT MNCRL AB 4-0 PS2 18 (SUTURE) ×3 IMPLANT
SUT VIC AB 3-0 SH 18 (SUTURE) ×3 IMPLANT
TAPE CLOTH SURG 4X10 WHT LF (GAUZE/BANDAGES/DRESSINGS) ×2 IMPLANT
TOWEL OR 17X24 6PK STRL BLUE (TOWEL DISPOSABLE) ×3 IMPLANT
TOWEL OR 17X26 10 PK STRL BLUE (TOWEL DISPOSABLE) ×3 IMPLANT

## 2015-05-09 NOTE — Anesthesia Postprocedure Evaluation (Signed)
  Anesthesia Post-op Note  Patient: [name]  Procedure(s) Performed: Procedure(s): LEFT SIMPLE MASTECTOMY (Left)  Patient Location: PACU  Anesthesia Type:General  Level of Consciousness: awake and alert   Airway and Oxygen Therapy: Patient Spontanous Breathing  Post-op Pain: Minimal   Post-op Assessment: Post-op Vital signs reviewed              Post-op Vital Signs: Reviewed  Last Vitals:  Filed Vitals:   05/09/15 1515  BP: 125/50  Pulse: 62  Temp:   Resp: 14    Complications: No apparent anesthesia complications

## 2015-05-09 NOTE — Progress Notes (Signed)
Report given to diane rn as caregiver 

## 2015-05-09 NOTE — Progress Notes (Signed)
CRNA at bedside.

## 2015-05-09 NOTE — Transfer of Care (Deleted)
Immediate Anesthesia Transfer of Care Note  Patient: [name]  Procedure(s) Performed: Procedure(s): LEFT SIMPLE MASTECTOMY (Left)  Patient Location: PACU  Anesthesia Type:General  Level of Consciousness: awake, oriented, sedated, patient cooperative and responds to stimulation  Airway & Oxygen Therapy: Patient Spontanous Breathing and Patient connected to nasal cannula oxygen  Post-op Assessment: Report given to RN, Post -op Vital signs reviewed and stable, Patient moving all extremities and Patient moving all extremities X 4  Post vital signs: Reviewed and stable  Last Vitals:  Filed Vitals:   05/09/15 1015  BP: 155/51  Pulse: 62  Temp:   Resp: 13    Complications: No apparent anesthesia complications

## 2015-05-09 NOTE — Anesthesia Preprocedure Evaluation (Addendum)
Anesthesia Evaluation  Patient identified by MRN, date of birth, ID band Patient awake    Reviewed: Allergy & Precautions, NPO status , Patient's Chart, lab work & pertinent test results, reviewed documented beta blocker date and time   Airway Mallampati: II  TM Distance: >3 FB Neck ROM: Limited    Dental  (+) Edentulous Upper, Edentulous Lower   Pulmonary asthma ,  breath sounds clear to auscultation        Cardiovascular hypertension, + Peripheral Vascular Disease Rhythm:Regular Rate:Normal     Neuro/Psych    GI/Hepatic GERD-  Medicated,  Endo/Other  diabetes, Type 2  Renal/GU      Musculoskeletal  (+) Arthritis -, Rheumatoid disorders,    Abdominal   Peds  Hematology   Anesthesia Other Findings   Reproductive/Obstetrics                           Lab Results  Component Value Date   WBC 12.5* 05/01/2015   HGB 12.1 05/01/2015   HCT 38.6 05/01/2015   MCV 92.8 05/01/2015   PLT 212 05/01/2015   Lab Results  Component Value Date   CREATININE 0.79 05/01/2015   BUN 7 05/01/2015   NA 136 05/01/2015   K 4.1 05/01/2015   CL 98* 05/01/2015   CO2 31 05/01/2015   EKG 2015: NSR  Anesthesia Physical Anesthesia Plan  ASA: III  Anesthesia Plan: General   Post-op Pain Management: GA combined w/ Regional for post-op pain   Induction: Intravenous  Airway Management Planned: Oral ETT  Additional Equipment:   Intra-op Plan:   Post-operative Plan: Extubation in OR  Informed Consent: I have reviewed the patients History and Physical, chart, labs and discussed the procedure including the risks, benefits and alternatives for the proposed anesthesia with the patient or authorized representative who has indicated his/her understanding and acceptance.     Plan Discussed with: CRNA  Anesthesia Plan Comments: (Anesthetic plan discussed in detail. Associated risk discussed including but not  limited to life threatening cardiovascular, pulmonary events and dental damage. The postoperative pain management and antiemetic plan discussed with patient. All questions answered in detail. Patient is in agreement.   )       Anesthesia Quick Evaluation

## 2015-05-09 NOTE — Op Note (Signed)
Left Modified Radical Mastectomy Procedure Note  Indications: This patient presents for surgical treatment of Breast Cancer.  patient is 81 yo female with triple negative left breast cancer and large fungating neglected mass.  She is a poor operative candidate and not a candidate for chemotherapy or radiation therapy.  This was discussed with the patient and her family and explained this was for local control not cure more than likely.  HIGH OPERATIVE RISK.The surgical and non surgical options have been discussed with the patient.  Risks of surgery include bleeding,  Infection,  Flap necrosis,  Tissue loss,  Chronic pain, death, Numbness,  And the need for additional procedures.  Reconstruction options also have been discussed with the patient as well.  The patient agrees to proceed.  Pre-operative Diagnosis: left breast cancer  Post-operative Diagnosis: left breast cancer  Surgeon: Erroll Luna A.   Assistants: Hitchcock RNFA  Anesthesia: General endotracheal anesthesia and pectoral block  ASA Class: 3  Procedure Details  The patient was seen again in the Holding Room. The risks, benefits, indications, potential complications, treatment options, and expected outcomes were discussed with the patient. The possibilities of reaction to medication, pulmonary aspiration, bleeding, recurrent infection, the need for additional procedures, failure to diagnose a condition, and creating a complication requiring transfusion or further operation were discussed with the patient. The patient and/or family concurred with the proposed plan, giving informed consent. The site of surgery was properly noted/marked. The patient was taken to the Operating Room, identified as [facility] and the procedure verified as left modified radical mastectomy. A Time Out was held and the above information confirmed.  The patient was placed supine and general anesthetic was administered. The left arm, left breast, and chest  were prepped and draped in standard fashion. An oblique elliptical incision was made encompassing the nipple. Skin flaps were created meticulously to preserve the subdermal blood supply and maintain a clear margin of resection around the tumor. Dissection was carried down to the pectoralis fascia, which was included with the specimen, and an axillary dissection was performed in continuity with the mastectomy. This included levels I and II. This was accomplished by exposing the axillary vein anteriorly and inferiorly to the level of the pectoralis minor and laterally. Small venous tributaries, lymphatics, and vessels were clipped and ligated or cauterized and divided. The subscapularis muscle was skeletonized. The long thoracic and thoracodorsal neurovascular bundles were identified and preserved. Hemostasis achieved.     The specimen was submitted to pathology. The wound was irrigated. One J-P drains were placed. One was placed within the lower flap and secured with 3 O nylon.  The skin incision was closed in layers with a 3-0 Vicryl  And 4 O monocryl. subcuticular closure. The drains were secured with 2-0 silk sutures. Steri-Strips were applied along with a dry bulky gauze dressing.  Instrument, sponge, and needle counts were correct at closure and at the conclusion of the case.   Findings: LARGE FUNGATING MASS LEFT AXILLARY ADENOPATHY  Estimated Blood Loss: less than 100 mL         Drains: ONE  JP drains placed as above         Total IV Fluids: 600 mL         Specimens: Breast with axillary contents   Complications: None; patient tolerated the procedure well.         Disposition: PACU - hemodynamically stable.         Condition: stable

## 2015-05-09 NOTE — Discharge Instructions (Signed)
[facility] [place] °[phone] ° °MASTECTOMY: POST OP INSTRUCTIONS ° °Always review your discharge instruction sheet given to you by the facility where your surgery was performed. °IF YOU HAVE DISABILITY OR FAMILY LEAVE FORMS, YOU MUST BRING THEM TO THE OFFICE FOR PROCESSING.   °DO NOT GIVE THEM TO YOUR DOCTOR. °A prescription for pain medication may be given to you upon discharge.  Take your pain medication as prescribed, if needed.  If narcotic pain medicine is not needed, then you may take acetaminophen (Tylenol) or ibuprofen (Advil) as needed. °1. Take your usually prescribed medications unless otherwise directed. °2. If you need a refill on your pain medication, please contact your pharmacy.  They will contact our office to request authorization.  Prescriptions will not be filled after 5pm or on week-ends. °3. You should follow a light diet the first few days after arrival home, such as soup and crackers, etc.  Resume your normal diet the day after surgery. °4. Most patients will experience some swelling and bruising on the chest and underarm.  Ice packs will help.  Swelling and bruising can take several days to resolve.  °5. It is common to experience some constipation if taking pain medication after surgery.  Increasing fluid intake and taking a stool softener (such as Colace) will usually help or prevent this problem from occurring.  A mild laxative (Milk of Magnesia or Miralax) should be taken according to package instructions if there are no bowel movements after 48 hours. °6. Unless discharge instructions indicate otherwise, leave your bandage dry and in place until your next appointment in 3-5 days.  You may take a limited sponge bath.  No tube baths or showers until the drains are removed.  You may have steri-strips (small skin tapes) in place directly over the incision.  These strips should be left on the skin for 7-10 days.  If your surgeon used skin glue on the incision, you may  shower in 24 hours.  The glue will flake off over the next 2-3 weeks.  Any sutures or staples will be removed at the office during your follow-up visit. °7. DRAINS:  If you have drains in place, it is important to keep a list of the amount of drainage produced each day in your drains.  Before leaving the hospital, you should be instructed on drain care.  Call our office if you have any questions about your drains. °8. ACTIVITIES:  You may resume regular (light) daily activities beginning the next day--such as daily self-care, walking, climbing stairs--gradually increasing activities as tolerated.  You may have sexual intercourse when it is comfortable.  Refrain from any heavy lifting or straining until approved by your doctor. °a. You may drive when you are no longer taking prescription pain medication, you can comfortably wear a seatbelt, and you can safely maneuver your car and apply brakes. °b. RETURN TO WORK:  __________________________________________________________ °9. You should see your doctor in the office for a follow-up appointment approximately 3-5 days after your surgery.  Your doctor’s nurse will typically make your follow-up appointment when she calls you with your pathology report.  Expect your pathology report 2-3 business days after your surgery.  You may call to check if you do not hear from us after three days.   °10. OTHER INSTRUCTIONS: ______________________________________________________________________________________________ ____________________________________________________________________________________________ °WHEN TO CALL YOUR DOCTOR: °1. Fever over 101.0 °2. Nausea and/or vomiting °3. Extreme swelling or bruising °4. Continued bleeding from incision. °5. Increased pain, redness, or drainage from the incision. °  The clinic staff is available to answer your questions during regular business hours.  Please don’t hesitate to call and ask to speak to one of the nurses for clinical  concerns.  If you have a medical emergency, go to the nearest emergency room or call 911.  A surgeon from [facility] is always on call at the hospital. °[address], [address], [place], [place]  [address] ? [address] [address], [place], [place]   [address] °[phone] ? [phone] ? FAX [phone] °Web site: [facility] ° ° °.Bulb Drain Home Care °A bulb drain consists of a thin rubber tube and a soft, round bulb that creates a gentle suction. The rubber tube is placed in the area where you had surgery. A bulb is attached to the end of the tube that is outside the body. The bulb drain removes excess fluid that normally builds up in a surgical wound after surgery. The color and amount of fluid will vary. Immediately after surgery, the fluid is bright red and is a little thicker than water. It may gradually change to a yellow or pink color and become more thin and water-like. When the amount decreases to about 1 or 2 tbsp in 24 hours, your health care provider will usually remove it. °DAILY CARE °· Keep the bulb flat (compressed) at all times, except while emptying it. The flatness creates suction. You can flatten the bulb by squeezing it firmly in the middle and then closing the cap. °· Keep sites where the tube enters the skin dry and covered with a bandage (dressing). °· Secure the tube 1-2 in (2.5-5.1 cm) below the insertion sites to keep it from pulling on your stitches. The tube is stitched in place and will not slip out. °· Secure the bulb as directed by your health care provider. °· For the first 3 days after surgery, there usually is more fluid in the bulb. Empty the bulb whenever it becomes half full because the bulb does not create enough suction if it is too full. The bulb could also overflow. Write down how much fluid you remove each time you empty your drain. Add up the amount removed in 24 hours. °· Empty the bulb at the same time every day once the amount of fluid decreases and you  only need to empty it once a day. Write down the amounts and the 24-hour totals to give to your health care provider. This helps your health care provider know when the tubes can be removed. °EMPTYING THE BULB DRAIN °Before emptying the bulb, get a measuring cup, a piece of paper and a pen, and wash your hands. °· Gently run your fingers down the tube (stripping) to empty any drainage from the tubing into the bulb. This may need to be done several times a day to clear the tubing of clots and tissue. °· Open the bulb cap to release suction, which causes it to inflate. Do not touch the inside of the cap. °· Gently run your fingers down the tube (stripping) to empty any drainage from the tubing into the bulb. °· Hold the cap out of the way, and pour fluid into the measuring cup.   °· Squeeze the bulb to provide suction.  °· Replace the cap.   °· Check the tape that holds the tube to your skin. If it is becoming loose, you can remove the loose piece of tape and apply a new one. Then, pin the bulb to your shirt.   °· Write down   the amount of fluid you emptied out. Write down the date and each time you emptied your bulb drain. (If there are 2 bulbs, note the amount of drainage from each bulb and keep the totals separate. Your health care provider will want to know the total amounts for each drain and which tube is draining more.)   °· Flush the fluid down the toilet and wash your hands.   °· Call your health care provider once you have less than 2 tbsp of fluid collecting in the bulb drain every 24 hours. °If there is drainage around the tube site, change dressings and keep the area dry. Cleanse around tube with sterile saline and place dry gauze around site. This gauze should be changed when it is soiled. If it stays clean and unsoiled, it should still be changed daily.  °SEEK MEDICAL CARE IF: °· Your drainage has a bad smell or is cloudy.   °· You have a fever.   °· Your drainage is increasing instead of decreasing.    °· Your tube fell out.   °· You have redness or swelling around the tube site.   °· You have drainage from a surgical wound.   °· Your bulb drain will not stay flat after you empty it.   °MAKE SURE YOU:  °· Understand these instructions. °· Will watch your condition. °· Will get help right away if you are not doing well or get worse. °Document Released: 09/19/2000 Document Revised: 02/06/2014 Document Reviewed: 02/25/2012 °[employer]® Patient Information ©2015 [employer]. This information is not intended to replace advice given to you by your health care provider. Make sure you discuss any questions you have with your health care provider. ° °

## 2015-05-09 NOTE — Interval H&P Note (Signed)
History and Physical Interval Note:  05/09/2015 9:28 AM  [name]  has presented today for surgery, with the diagnosis of Left Breast Cancer  The various methods of treatment have been discussed with the patient and family. After consideration of risks, benefits and other options for treatment, the patient has consented to  Procedure(s): LEFT SIMPLE MASTECTOMY (Left) as a surgical intervention .  The patient's history has been reviewed, patient examined, no change in status, stable for surgery.  I have reviewed the patient's chart and labs.  Questions were answered to the patient's satisfaction.     Irvin Lizama A.

## 2015-05-09 NOTE — Anesthesia Procedure Notes (Addendum)
Anesthesia Regional Block:  Pectoralis block  Pre-Anesthetic Checklist: ,, timeout performed, Correct Patient, Correct Site, Correct Laterality, Correct Procedure, Correct Position, site marked, Risks and benefits discussed,  Surgical consent,  Pre-op evaluation,  At surgeon's request and post-op pain management  Laterality: Left  Prep: chloraprep       Needles:  Injection technique: Single-shot  Needle Type: Echogenic Needle     Needle Length: 9cm 9 cm Needle Gauge: 21 and 21 G    Additional Needles:  Procedures: ultrasound guided (picture in chart) Pectoralis block Narrative:  Start time: 05/09/2015 10:05 AM End time: 05/09/2015 10:14 AM Injection made incrementally with aspirations every 5 mL.  Performed by: Personally  Anesthesiologist: Marye Round  Additional Notes: No complications noted.   Procedure Name: Intubation Date/Time: 05/09/2015 10:38 AM Performed by: Macklen Wilhoite, Sheron Nightingale Pre-anesthesia Checklist: Patient identified, Timeout performed, Emergency Drugs available, Suction available and Patient being monitored Patient Re-evaluated:Patient Re-evaluated prior to inductionOxygen Delivery Method: Circle system utilized Preoxygenation: Pre-oxygenation with 100% oxygen Intubation Type: IV induction Ventilation: Mask ventilation without difficulty Laryngoscope Size: Mac Grade View: Grade I Tube size: 7.0 mm Number of attempts: 1 Placement Confirmation: ETT inserted through vocal cords under direct vision,  positive ETCO2 and breath sounds checked- equal and bilateral Secured at: 19 cm Dental Injury: Teeth and Oropharynx as per pre-operative assessment

## 2015-05-09 NOTE — Transfer of Care (Signed)
Immediate Anesthesia Transfer of Care Note  Patient: [name]  Procedure(s) Performed: Procedure(s): LEFT SIMPLE MASTECTOMY (Left)  Patient Location: PACU  Anesthesia Type:GA combined with regional for post-op pain  Level of Consciousness: awake, oriented, sedated, patient cooperative and responds to stimulation  Airway & Oxygen Therapy: Patient Spontanous Breathing and Patient connected to nasal cannula oxygen  Post-op Assessment: Report given to RN, Post -op Vital signs reviewed and stable, Patient moving all extremities and Patient moving all extremities X 4  Post vital signs: Reviewed and stable  Last Vitals:  Filed Vitals:   05/09/15 1015  BP: 155/51  Pulse: 62  Temp:   Resp: 13    Complications: No apparent anesthesia complications

## 2015-05-09 NOTE — H&P (View-Only) (Signed)
[name] 04/12/2015 9:25 AM Location: [facility] Patient #: [id number] DOB: 02/07/1926 Widowed / Language: Cleophus Molt / Race: Black or African American Female History of Present Illness Sabrina Moores A. Sarahjane Matherly MD; 04/12/2015 9:51 AM) Patient words: breast cancer   CLINICAL DATA: Patient has a palpable firm mass in the left breast. Patient has a history of right mastectomy for breast carcinoma in 1979. This patient cannot be positioned for mammography. EXAM: ULTRASOUND OF THE LEFT BREAST COMPARISON: None FINDINGS: On physical exam, there is a firm, relatively fixed, mass in the upper outer left breast. Targeted ultrasound is performed, showing hypoechoic mass with irregular partly ill-defined margins in the 2 o'clock position of the left breast, 4 cm from the nipple, measuring 3.8 cm x 2.7 cm by 2.8 cm. In the left axilla there is a single mildly prominent lymph node measuring 7 mm in short axis with a cortex measuring 3.8 mm in thickness. This lymph node has normal morphology. IMPRESSION: Large left breast mass likely a primary breast malignancy. RECOMMENDATION: Ultrasound-guided core needle biopsy of a left breast mass. I have discussed the findings and recommendations with the patient. Results were also provided in writing at the conclusion of the visit. If applicable, a reminder letter will be sent to the patient regarding the next appointment. BI-RADS CATEGORY 4: Suspicious abnormality - biopsy should be considered. Electronically Signed: By: Lajean Manes M.D. On: 03/20/2015 12:52   Result History US BREAST LTD UNI LEFT INC AXILLA (Order #[id number]) on 03/22/2015 - Order Result History Report <epic://OPTION/?[email or web address]>    Vitals Height Weight BMI (Calculated) 5' 2"  (1.575 m) 49.306 kg (108 lb 11.2 oz) 20.2  Interpretation Summary CLINICAL DATA: Patient has a palpable firm mass in the left breast. Patient has a history of right mastectomy for breast carcinoma in  1979. This patient cannot be positioned for mammography. EXAM: ULTRASOUND OF THE LEFT BREAST COMPARISON: None FINDINGS: On physical exam, there is a firm, relatively fixed, mass in the upper outer left breast. Targeted ultrasound is performed, showing hypoechoic mass with irregular partly ill-defined margins in the 2 o'clock position of the left breast, 4 cm from the nipple, measuring 3.8 cm x 2.7 cm by 2.8 cm. In the left axilla there is a single mildly prominent lymph node measuring 7 mm in short axis with a cortex measuring 3.8 mm in thickness. This lymph node has normal morphology. IMPRESSION: Large left breast mass likely a primary breast malignancy. RECOMMENDATION: Ultrasound-guided core needle biopsy of a left breast mass. I have discussed the findings and recommendations with the patient. Results were also provided in writing at the conclusion of the visit. If applicable, a reminder letter will be sent to the patient regarding the next appointment. BI-RADS CATEGORY 4: Suspicious abnormality - biopsy should be considered. Electronically Signed: By: Lajean Manes M.D. On: 03/20/2015 12:52  Addendum by Lajean Manes, MD on Thu Mar 22, 2015 10:43 AM ADDENDUM REPORT: 03/22/2015 10:40 ADDENDUM: Patient's prior mammography of her left breast from 2009 and 2010 have become available for correlation with the current ultrasound. The mass seen on ultrasound was not present on the prior mammograms. Electronically Signed By: Lajean Manes M.D. On: 03/22/2015 10:40                Patient: [name] Collected: 03/20/2015 Client: [facility] Accession: [id number] Received: 03/20/2015 Lajean Manes, MD DOB: 1926-05-21 Age: 81 Gender: F Reported: 03/21/2015 [address] Patient Ph: [phone] MRN #: [id number] Linna Hoff, [place] [address] Visit #: [id number].[email or web address] Chart #:  Phone: Fax: CC: Ancil Linsey, MD REPORT OF SURGICAL PATHOLOGY ADDITIONAL  INFORMATION: FLUORESCENCE IN-SITU HYBRIDIZATION Results: HER2 - NEGATIVE RATIO OF HER2/CEP17 SIGNALS 1.54 AVERAGE HER2 COPY NUMBER PER CELL 3.55 Reference Range: NEGATIVE HER2/CEP17 Ratio <2.0 and average HER2 copy number <4.0 EQUIVOCAL HER2/CEP17 Ratio <2.0 and average HER2 copy number 4.0 and <6.0 POSITIVE HER2/CEP17 Ratio >=2.0 or <2.0 and average HER2 copy number >=6.0 Enid Cutter MD Pathologist, Electronic Signature ( Signed 03/27/2015) PROGNOSTIC INDICATORS - ACIS Results: IMMUNOHISTOCHEMICAL AND MORPHOMETRIC ANALYSIS BY THE AUTOMATED CELLULAR IMAGING SYSTEM (ACIS) Estrogen Receptor: 0%, NEGATIVE (PERFORMED MANUALLY) Progesterone Receptor: 1%, POSITIVE, STRONG STAINING INTENSITY (PERFORMED MANUALLY) Proliferation Marker Ki67: 30% (PERFORMED MANUALLY) COMMENT: The negative hormone receptor study(ies) in this case has an internal positive control. REFERENCE RANGE ESTROGEN RECEPTOR NEGATIVE <1% POSITIVE =>1% PROGESTERONE RECEPTOR 1 of 3 FINAL for [facility], Raesha ([id number]) ADDITIONAL INFORMATION:(continued) NEGATIVE <1% POSITIVE =>1% All controls stained appropriately Mali RUND DO Pathologist, Electronic Signature ( Signed 03/22/2015) FINAL DIAGNOSIS Diagnosis Breast, left, needle core biopsy, upper outer quadrant - INVASIVE DUCTAL CARCINOMA. - LYMPHOVASCULAR INVASION IS IDENTIFIED. - SEE COMMENT. Microscopic Comment The carcinoma appears grade 3. A breast prognostic profile will be performed and the results reported separately. (JBK:gt, 03/21/15) Enid Cutter MD Pathologist, Electronic Signature (Case signed 03/21/2015) Specimen Gross and Clinical Information Specimen(s) Obtained: Breast, left, needle core biopsy, upper outer quadrant Specimen Clinical Information large left breast mass, hx of right mastectomy for cancer in 1979; should be [facility] Gross Received in formalin labeled left breast, and consists of four cores of tan yellow, focally hemorrhagic  fibroadipose tissue, ranging from 1.7 x 0.3 x 0.2 cm to 2.7 x 0.2 x 0.2 cm. The specimen is entirely submitted in one cassette. Time in formalin 12:12 p.m. on 03/20/15. Cold ischemic time 2 minutes. (KL:gt, 03/20/15) Stain(s) used in Diagnosis: The following stain(s) were used in diagnosing the case: Her2 FISH, ER-ACIS, PR-ACIS, KI-67-ACIS. The control(s) stained appropriately. Disclaimer HER2 IQFISH pharmDX (code [id number]) is a direct fluorescence in-situ hybridization assay designed to quantitatively determine HER2 gene amplification in formalin-fixed,.  The patient is a 81 year old female who presents with breast cancer. 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 Tumor markers include estrogen receptor negative, progesterone receptor negative and HER-2/neu negative. The patient was referred by the surgeon. Initial presentation was 1 month(s) ago for breast mass on self-examination, breast mass on medical examination and breast pain. Current diagnosis was determined by mammography and core needle biopsy. This problem has not been previously evaluated. This problem has not been previously treated. Other Problems Elbert Ewings, CMA; 04/12/2015 9:25 AM) Arthritis Asthma Breast Cancer Diabetes Mellitus Diverticulosis Gastric Ulcer Gastroesophageal Reflux Disease High blood pressure Home Oxygen Use Hypercholesterolemia Lump In Breast  Past Surgical History Elbert Ewings, CMA; 04/12/2015 9:25 AM) Breast Biopsy Left. Cataract Surgery Bilateral. Laparoscopic Inguinal Hernia Surgery Left. Mastectomy Right.  Diagnostic Studies History Elbert Ewings, [place]; 04/12/2015 9:25 AM) Colonoscopy 5-10 years ago Mammogram 1-3 years ago Pap Smear 1-5 years ago  Allergies Elbert Ewings, CMA; 04/12/2015 9:26 AM) Aspirin Low Strength *ANALGESICS - NonNarcotic*  Medication History Elbert Ewings, CMA; 04/12/2015 9:28 AM) Atorvastatin Calcium  (20MG Tablet, Oral) Active. Tradjenta (5MG Tablet, Oral) Active. Metoprolol Succinate ER (25MG Tablet ER 24HR, Oral) Active. Raloxifene HCl (60MG Tablet, Oral) Active. PredniSONE (5MG Tablet, Oral) Active. Pantoprazole Sodium (40MG Tablet DR, Oral) Active. MetFORMIN HCl ER (OSM) (500MG Tablet ER 24HR, Oral) Active. Ondansetron HCl (4MG Tablet, Oral) Active.  Medications Reconciled  Social History Elbert Ewings, [place]; 04/12/2015 9:25 AM) Caffeine use Coffee. No alcohol use No drug use Tobacco use Never smoker.  Family History Elbert Ewings, [place]; 04/12/2015 9:25 AM) Family history unknown First Degree Relatives  Pregnancy / Birth History Elbert Ewings, [place]; 04/12/2015 9:25 AM) Age at menarche 25 years. Age of menopause 28-60 Gravida 37 Maternal age 61-25 Para 4     Review of Systems Elbert Ewings CMA; 04/12/2015 9:25 AM) General Not Present- Appetite Loss, Chills, Fatigue, Fever, Night Sweats, Weight Gain and Weight Loss. Skin Not Present- Change in Wart/Mole, Dryness, Hives, Jaundice, New Lesions, Non-Healing Wounds, Rash and Ulcer. HEENT Present- Wears glasses/contact lenses. Not Present- Earache, Hearing Loss, Hoarseness, Nose Bleed, Oral Ulcers, Ringing in the Ears, Seasonal Allergies, Sinus Pain, Sore Throat, Visual Disturbances and Yellow Eyes. Respiratory Present- Chronic Cough. Not Present- Bloody sputum, Difficulty Breathing, Snoring and Wheezing. Cardiovascular Not Present- Chest Pain, Difficulty Breathing Lying Down, Leg Cramps, Palpitations, Rapid Heart Rate, Shortness of Breath and Swelling of Extremities. Gastrointestinal Present- Constipation. Not Present- Abdominal Pain, Bloating, Bloody Stool, Change in Bowel Habits, Chronic diarrhea, Difficulty Swallowing, Excessive gas, Gets full quickly at meals, Hemorrhoids, Indigestion, Nausea, Rectal Pain and Vomiting. Female Genitourinary Not Present- Frequency, Nocturia, Painful Urination, Pelvic Pain and  Urgency. Musculoskeletal Present- Joint Pain, Joint Stiffness and Muscle Weakness. Not Present- Back Pain, Muscle Pain and Swelling of Extremities. Neurological Present- Numbness, Tingling and Trouble walking. Not Present- Decreased Memory, Fainting, Headaches, Seizures, Tremor and Weakness. Psychiatric Not Present- Anxiety, Bipolar, Change in Sleep Pattern, Depression, Fearful and Frequent crying. Endocrine Present- Hot flashes. Not Present- Cold Intolerance, Excessive Hunger, Hair Changes, Heat Intolerance and New Diabetes. Hematology Not Present- Easy Bruising, Excessive bleeding, Gland problems, HIV and Persistent Infections.  Vitals Elbert Ewings CMA; 04/12/2015 9:29 AM) 04/12/2015 9:28 AM Weight: 108 lb Height: 63in Body Surface Area: 1.48 m Body Mass Index: 19.13 kg/m Temp.: 98.43F(Oral)  Pulse: 74 (Regular)  BP: 124/64 (Sitting, Left Arm, Standard)     Physical Exam (Peri Kreft A. Martha Ellerby MD; 04/12/2015 9:53 AM)  General Note: WHEELCHAIR BOUND VERY FRAIL WITH FAMILY   Head and Neck Head-normocephalic, atraumatic with no lesions or palpable masses. Trachea-midline. Thyroid Gland Characteristics - normal size and consistency.  Eye Eyeball - Bilateral-Extraocular movements intact. Sclera/Conjunctiva - Bilateral-No scleral icterus.  Chest and Lung Exam Chest and lung exam reveals -quiet, even and easy respiratory effort with no use of accessory muscles and on auscultation, normal breath sounds, no adventitious sounds and normal vocal resonance. Inspection Chest Wall - Normal. Back - normal.  Breast Note: RIGHT BREAST ABSENT 24 CM MOBILE MASS WITH SKIN CHANGES LEFT BREAST UPPER OUTER QUADRANT   Cardiovascular Cardiovascular examination reveals -normal heart sounds, regular rate and rhythm with no murmurs and normal pedal pulses bilaterally.  Neurologic Neurologic evaluation reveals -alert and oriented x 3 with no impairment of recent or remote  memory. Mental Status-Normal. Note: IN WHEELCHAIR UNABLE TO WALK   Lymphatic Head & Neck  General Head & Neck Lymphatics: Bilateral - Description - Normal. Axillary  General Axillary Region: Bilateral - Description - Normal. Tenderness - Non Tender.    Assessment & Plan (Jaspreet Bodner A. Mccall Lomax MD; 04/12/2015 9:57 AM)  BREAST CANCER, STAGE 2, LEFT (174.9  C50.912) Impression: VERY FRAIL AND TRIPLE NEGATIVE TUMOR WITH LYMPHOVASCULAR INVASION PRESENT. WAS ON SCHEDULE AT [facility] BUT CANCELLED DUE TO ANESTHESIA CONCERNS? DON NOT FEEL LN REMOVAL OF ANY BENEFIT UNLESS CLINICALLY POSITIVE NODES ENCOUNTERED AND GIVEN MULTIPLE COMORBIDITIES AND VERY POOR HEALTH,  SHE WILL UNLIKELY RECEIVE ANY CHEMOTHERAPY. NOT SURE SHE WILL TOLERATE RADIATION THERAPY EITHER. POOR OPERATIVE CANDIDATE BUT THIS WILL ACHEIVE LOCAL CONTROL. FAMILY AWAERE OF PROGNOSTIC PANEL AND PT AT SIGNIFICANT RISK OF LOCOREGIONAL RECURRENCE AND METASTATIC POTENTIAL. THEY AGREE TO PROCEED Discussed treatment options for breast cancer to include breast conservation vs mastectomy with reconstruction. Pt has decided on mastectomy. Risk include bleeding, infection, flap necrosis, pain, numbness, recurrence, hematoma, other surgery needs. Pt understands and agrees to proceed.  Current Plans Pt Education - Breast Cancer: discussed with patient and provided information. Pt Education - Patient information: Breast cancer (The Basics): discussed with patient and provided information. Pt Education - Overview of the treatment of newly diagnosed, non-metastatic breast cancer: discussed with patient and provided information. Pt Education - CCS Mastectomy HCI

## 2015-05-10 ENCOUNTER — Encounter (HOSPITAL_COMMUNITY): Payer: Self-pay | Admitting: Surgery

## 2015-05-10 DIAGNOSIS — C50912 Malignant neoplasm of unspecified site of left female breast: Secondary | ICD-10-CM | POA: Diagnosis not present

## 2015-05-10 LAB — BASIC METABOLIC PANEL
ANION GAP: 8 (ref 5–15)
BUN: 9 mg/dL (ref 6–20)
CHLORIDE: 101 mmol/L (ref 101–111)
CO2: 28 mmol/L (ref 22–32)
Calcium: 8.3 mg/dL — ABNORMAL LOW (ref 8.9–10.3)
Creatinine, Ser: 0.81 mg/dL (ref 0.44–1.00)
GFR calc Af Amer: >60 mL/min (ref >60)
GFR calc non Af Amer: >60 mL/min (ref >60)
GLUCOSE: 197 mg/dL — AB (ref 65–99)
POTASSIUM: 4.2 mmol/L (ref 3.5–5.1)
SODIUM: 137 mmol/L (ref 135–145)

## 2015-05-10 LAB — CBC
HEMATOCRIT: 32.7 % — AB (ref 36.0–46.0)
HEMOGLOBIN: 10.2 g/dL — AB (ref 12.0–15.0)
MCH: 28.6 pg (ref 26.0–34.0)
MCHC: 31.2 g/dL (ref 30.0–36.0)
MCV: 91.6 fL (ref 78.0–100.0)
PLATELETS: 193 10*3/uL (ref 150–400)
RBC: 3.57 MIL/uL — AB (ref 3.87–5.11)
RDW: 13.2 % (ref 11.5–15.5)
WBC: 9.7 10*3/uL (ref 4.0–10.5)

## 2015-05-10 LAB — GLUCOSE, CAPILLARY: Glucose-Capillary: 200 mg/dL — ABNORMAL HIGH (ref 65–99)

## 2015-05-10 MED ORDER — TRAMADOL HCL 50 MG PO TABS: 50.0000 mg | ORAL_TABLET | Freq: Four times a day (QID) | ORAL | Status: DC | PRN

## 2015-05-10 NOTE — Discharge Planning (Signed)
AVS reviewed with daughters, JP care demonstrated, verbalized understanding. Dc'd per w/c to private car home with all personal belongings at 30, accompanied by family.

## 2015-05-10 NOTE — Discharge Summary (Signed)
Physician Discharge Summary  Patient ID: [name] MRN: [id number] DOB/AGE: 1926/01/12 81 y.o.  Admit date: 05/09/2015 Discharge date: 05/10/2015  Admission Diagnoses: Breast cancer  Discharge Diagnoses:  Active Problems:   Breast cancer   Discharged Condition: good  Hospital Course: 65 yof s/p left mrm. Doing well, will be discharged home with family today  Consults: None  Significant Diagnostic Studies: none  Treatments: surgery: left mrm  Discharge Exam: Blood pressure 109/42, pulse 78, temperature 99 F (37.2 C), temperature source Oral, resp. rate 16, height 5\' 3"  (1.6 m), weight 48 kg (105 lb 13.1 oz), SpO2 96 %. General appearance: no distress Resp: clear to auscultation bilaterally Cardio: regular rate and rhythm Incision/Wound:no hematoma, drain with expected output  Disposition: 01-Home or Self Care     Medication List    TAKE these medications        aspirin EC 81 MG tablet  Take 81 mg by mouth 2 (two) times daily.     atorvastatin 20 MG tablet  Commonly known as:  LIPITOR  Take 20 mg by mouth at bedtime.     budesonide-formoterol 160-4.5 MCG/ACT inhaler  Commonly known as:  SYMBICORT  Inhale 2 puffs into the lungs 2 (two) times daily.     CENTRUM SILVER ADULT 50+ PO  Take 1 tablet by mouth every morning.     chlorpheniramine-HYDROcodone 10-8 MG/5ML Lqcr  Commonly known as:  TUSSIONEX PENNKINETIC ER  Take 2.5 mLs by mouth every 12 (twelve) hours as needed for cough.     CLEAR EYES ALL SEASONS 5-6 MG/ML Soln  Generic drug:  Polyvinyl Alcohol-Povidone  Apply 1 drop to eye every morning.     fish oil-omega-3 fatty acids 1000 MG capsule  Take 1 g by mouth every morning.     ipratropium 0.02 % nebulizer solution  Commonly known as:  ATROVENT  Take 500 mcg by nebulization daily. With albuterol as needed for shortness of breath     metFORMIN 500 MG tablet  Commonly known as:  GLUCOPHAGE  Take 500-1,000 mg by mouth 2 (two) times daily. 1 in  the morning and 2 at bedtime     metoprolol succinate 25 MG 24 hr tablet  Commonly known as:  TOPROL-XL  Take 25 mg by mouth daily.     ondansetron 4 MG disintegrating tablet  Commonly known as:  ZOFRAN ODT  4mg  ODT q4 hours prn nausea/vomit     pantoprazole 40 MG tablet  Commonly known as:  PROTONIX  Take 40 mg by mouth daily.     predniSONE 5 MG tablet  Commonly known as:  DELTASONE  Take 5 mg by mouth daily with breakfast.     PROVENTIL HFA 108 (90 BASE) MCG/ACT inhaler  Generic drug:  albuterol  Inhale 2 puffs into the lungs every 6 (six) hours as needed for shortness of breath.     albuterol (2.5 MG/3ML) 0.083% nebulizer solution  Commonly known as:  PROVENTIL  Take 2.5 mg by nebulization daily. With Ipratropium as needed for shortness of breath     raloxifene 60 MG tablet  Commonly known as:  EVISTA  Take 60 mg by mouth every morning.     TRADJENTA 5 MG Tabs tablet  Generic drug:  linagliptin  Take 5 mg by mouth every morning.     traMADol 50 MG tablet  Commonly known as:  ULTRAM  Take 50 mg by mouth every 6 (six) hours as needed (pain).     traMADol 50 MG tablet  Commonly known as:  ULTRAM  Take 1 tablet (50 mg total) by mouth every 6 (six) hours as needed (pain).     Vitamin D3 1000 UNITS Caps  Take 1 capsule by mouth daily.           Follow-up Information    Follow up with CORNETT,THOMAS A., MD In 1 week.   Specialty:  General Surgery   Contact information:   [address] [place] [address] [phone]       Signed: Rolm Bookbinder 05/10/2015, 8:12 AM

## 2015-05-16 ENCOUNTER — Encounter (HOSPITAL_COMMUNITY): Payer: Self-pay | Admitting: Hematology & Oncology

## 2015-05-16 ENCOUNTER — Encounter (HOSPITAL_COMMUNITY): Payer: Medicare Other | Attending: Hematology & Oncology | Admitting: Hematology & Oncology

## 2015-05-16 VITALS — BP 122/59 | HR 78 | Temp 98.6°F | Resp 16 | Wt 105.0 lb

## 2015-05-16 DIAGNOSIS — C50912 Malignant neoplasm of unspecified site of left female breast: Secondary | ICD-10-CM | POA: Insufficient documentation

## 2015-05-16 MED ORDER — TAMOXIFEN CITRATE 20 MG PO TABS: 20.0000 mg | ORAL_TABLET | Freq: Every day | ORAL | Status: DC

## 2015-05-16 NOTE — Progress Notes (Signed)
[name] at [facility] NOTE  Patient Care Team: Moshe Cipro, MD as PCP - General (Internal Medicine)  CHIEF COMPLAINTS/PURPOSE OF CONSULTATION:  Left Breast Cancer Biopsy on 03/20/2015 with ER- PR+(1% with strong staining intensity) HER2 neu- invasive ductal carcinoma pT2, pN0 Mastectomy on 05/09/2015  HISTORY OF PRESENTING ILLNESS:  [name] 81 y.o. female is here because of newly diagnosed left breast cancer. Her last mammogram was over 5 years ago as the patient notes she is unable to obtain them because of her back. She has significant kyphosis.  The patient has had a Left mastectomy and it went well.  She is still draining, 5-15 mL per day.  She was told that she is not a candidate for chemotherapy nor radiation.  She has a follow up appointment on May 21, 2015.  She has had a hysterectomy. The patient and family are interested in doing therapy there is any available that the patient could tolerate. Her bowels are moving normal and she has not complained of any pain.  She has been using an Chiropodist.  MEDICAL HISTORY:  Past Medical History  Diagnosis Date  . Asthma   . Hypertension   . Diabetes mellitus without complication   . Acid reflux   . Kyphosis   . Gastric ulcer   . Raynaud disease   . Anemia   . Cancer   . Arthritis     ra    SURGICAL HISTORY: Past Surgical History  Procedure Laterality Date  . Hernia repair    . Breast surgery    . Mastectomy    . Simple mastectomy with axillary sentinel node biopsy Left 05/09/2015    Procedure: LEFT SIMPLE MASTECTOMY;  Surgeon: Erroll Luna, MD;  Location: [facility];  Service: General;  Laterality: Left;    SOCIAL HISTORY: Social History   Social History  . Marital Status: Married    Spouse Name: N/A  . Number of Children: N/A  . Years of Education: N/A   Occupational History  . Not on file.   Social History Main Topics  . Smoking status: Never Smoker   .  Smokeless tobacco: Never Used  . Alcohol Use: No  . Drug Use: No  . Sexual Activity: No   Other Topics Concern  . Not on file   Social History Narrative  4 daughters 8 grandchildren 31 great grandchildren Widowed Ex-cleaner Non smoker ETOH, none   FAMILY HISTORY: Family History  Problem Relation Age of Onset  . Asthma Sister   . Rheum arthritis Daughter    has no family status information on file.  Father deceased, 52 Mother deceased, young age 52 sisters deceased 2 half-sisters alive 39 brothers   ALLERGIES:  is allergic to aspirin.  MEDICATIONS:  Current Outpatient Prescriptions  Medication Sig Dispense Refill  . albuterol (PROVENTIL HFA) 108 (90 BASE) MCG/ACT inhaler Inhale 2 puffs into the lungs every 6 (six) hours as needed for shortness of breath.     Marland Kitchen albuterol (PROVENTIL) (2.5 MG/3ML) 0.083% nebulizer solution Take 2.5 mg by nebulization daily. With Ipratropium as needed for shortness of breath    . aspirin EC 81 MG tablet Take 81 mg by mouth 2 (two) times daily.     Marland Kitchen atorvastatin (LIPITOR) 20 MG tablet Take 20 mg by mouth at bedtime.    . budesonide-formoterol (SYMBICORT) 160-4.5 MCG/ACT inhaler Inhale 2 puffs into the lungs 2 (two) times daily.    . chlorpheniramine-HYDROcodone (TUSSIONEX PENNKINETIC ER) 10-8  MG/5ML LQCR Take 2.5 mLs by mouth every 12 (twelve) hours as needed for cough. 100 mL 0  . Cholecalciferol (VITAMIN D3) 1000 UNITS CAPS Take 1 capsule by mouth daily.    . fish oil-omega-3 fatty acids 1000 MG capsule Take 1 g by mouth every morning.    Marland Kitchen ipratropium (ATROVENT) 0.02 % nebulizer solution Take 500 mcg by nebulization daily. With albuterol as needed for shortness of breath    . linagliptin (TRADJENTA) 5 MG TABS tablet Take 5 mg by mouth every morning.    . metFORMIN (GLUCOPHAGE) 500 MG tablet Take 500-1,000 mg by mouth 2 (two) times daily. 1 in the morning and 2 at bedtime    . metoprolol succinate (TOPROL-XL) 25 MG 24 hr tablet Take 25 mg  by mouth daily.    . Multiple Vitamins-Minerals (CENTRUM SILVER ADULT 50+ PO) Take 1 tablet by mouth every morning.     . ondansetron (ZOFRAN ODT) 4 MG disintegrating tablet 82m ODT q4 hours prn nausea/vomit (Patient taking differently: Take 4 mg by mouth every 4 (four) hours as needed for nausea or vomiting. ) 12 tablet 0  . pantoprazole (PROTONIX) 40 MG tablet Take 40 mg by mouth daily.    . Polyvinyl Alcohol-Povidone (CLEAR EYES ALL SEASONS) 5-6 MG/ML SOLN Apply 1 drop to eye every morning.     . predniSONE (DELTASONE) 5 MG tablet Take 5 mg by mouth daily with breakfast.    . raloxifene (EVISTA) 60 MG tablet Take 60 mg by mouth every morning.    . traMADol (ULTRAM) 50 MG tablet Take 1 tablet (50 mg total) by mouth every 6 (six) hours as needed (pain). 30 tablet 1  . tamoxifen (NOLVADEX) 20 MG tablet Take 1 tablet (20 mg total) by mouth daily. 30 tablet 3  . traMADol (ULTRAM) 50 MG tablet Take 50 mg by mouth every 6 (six) hours as needed (pain).      No current facility-administered medications for this visit.    Review of Systems  Constitutional: Negative for fever, chills, weight loss, malaise/fatigue and diaphoresis.  HENT: Positive for hearing loss. Negative for congestion, ear discharge, ear pain, nosebleeds, sore throat and tinnitus.   Eyes: Negative.   Respiratory: Negative.  Negative for stridor.   Cardiovascular: Negative.   Genitourinary: Negative.   Musculoskeletal: Positive for myalgias. Negative for back pain, joint pain and neck pain.       Not ambulatory  Skin: Negative.   Neurological: Positive for weakness. Negative for dizziness, tingling, tremors, sensory change, speech change, focal weakness, seizures, loss of consciousness and headaches.  Endo/Heme/Allergies: Negative.   Psychiatric/Behavioral: Negative.   All other systems reviewed and are negative.  14 point ROS was done and is otherwise as detailed above or in HPI   PHYSICAL EXAMINATION: ECOG PERFORMANCE  STATUS: 2 - Symptomatic, <50% confined to bed  Filed Vitals:   05/16/15 1100  BP: 122/59  Pulse: 78  Temp: 98.6 F (37 C)  Resp: 16   Filed Weights   05/16/15 1100  Weight: 105 lb (47.628 kg)    Physical Exam  Constitutional: She is oriented to person, place, and time. No distress.  Frail appearing, in wheelchair, appropriate, "hunched over" (significant kyphosis) she is unable to raise her head. joint deformity of hands noted  HENT:  Head: Normocephalic and atraumatic.  Right Ear: External ear normal.  Left Ear: External ear normal.  Mouth/Throat: Oropharynx is clear and moist.  Eyes: Conjunctivae and EOM are normal. Pupils are equal, round,  and reactive to light.  Neck: Normal range of motion. Neck supple. No tracheal deviation present. No thyromegaly present.  Cardiovascular: Normal rate and regular rhythm.  Exam reveals no gallop and no friction rub.   Pulmonary/Chest: Effort normal and breath sounds normal. No respiratory distress. She has no wheezes. She has no rales.   Breast exam; bilateral mastectomy sites noted. Recent left breast mastectomy site well healing.  Abdominal: Soft. She exhibits no distension and no mass. There is no tenderness. There is no rebound and no guarding.  Exam limited secondary to patient's sitting in wheelchair, family and patient feel unable to get her onto the exam table  Musculoskeletal: She exhibits no edema.  Lymphadenopathy:    She has no cervical adenopathy.  Neurological: She is alert and oriented to person, place, and time. No cranial nerve deficit.  Skin: Skin is warm and dry. She is not diaphoretic.  Psychiatric: Mood, memory, affect and judgment normal.  Nursing note and vitals reviewed.   LABORATORY DATA:  I have reviewed the data as listed Lab Results  Component Value Date   WBC 9.7 05/10/2015   HGB 10.2* 05/10/2015   HCT 32.7* 05/10/2015   MCV 91.6 05/10/2015   PLT 193 05/10/2015    RADIOGRAPHIC STUDIES: I have  personally reviewed the radiological images as listed and agreed with the findings in the report.  CLINICAL DATA: Patient has a palpable firm mass in the left breast. Patient has a history of right mastectomy for breast carcinoma in 1979. This patient cannot be positioned for mammography.  EXAM: ULTRASOUND OF THE LEFT BREAST  COMPARISON: None  FINDINGS: On physical exam, there is a firm, relatively fixed, mass in the upper outer left breast.  Targeted ultrasound is performed, showing hypoechoic mass with irregular partly ill-defined margins in the 2 o'clock position of the left breast, 4 cm from the nipple, measuring 3.8 cm x 2.7 cm by 2.8 cm. In the left axilla there is a single mildly prominent lymph node measuring 7 mm in short axis with a cortex measuring 3.8 mm in thickness. This lymph node has normal morphology.  IMPRESSION: Large left breast mass likely a primary breast malignancy.  RECOMMENDATION: Ultrasound-guided core needle biopsy of a left breast mass.  I have discussed the findings and recommendations with the patient. Results were also provided in writing at the conclusion of the visit. If applicable, a reminder letter will be sent to the patient regarding the next appointment.  BI-RADS CATEGORY 4: Suspicious abnormality - biopsy should be considered.  Electronically Signed: By: Lajean Manes M.D. On: 03/20/2015 12:52    FINAL DIAGNOSIS Diagnosis 1. Breast, simple mastectomy, Left - INVASIVE GRADE III DUCTAL CARCINOMA, SPANNING 4.8 CM IN GREATEST DIMENSION. - MARGINS ARE NEGATIVE. - SEE ONCOLOGY TEMPLATE. 2. Lymph nodes, regional resection, Left axillary contents - FIVE BENIGN LYMPH NODES WITH NO TUMOR SEEN (0/5).   BREAST, INVASIVE TUMOR, WITH LYMPH NODES PRESENT Specimen, including laterality and lymph node sampling (sentinel, non-sentinel): Left breast with left axillary contents. Procedure: Left mastectomy with left axillary  excision. Histologic type: Invasive ductal carcinoma. Grade: 3. Tubule formation: 3. Nuclear pleomorphism: 2. Mitotic: 3. Tumor size (gross measurement): 4.8 cm. Margins: Invasive, distance to closest margin: 0.2 cm (deep margin). Lymphovascular invasion: Definitive lymph/vascular invasion is not identified on the current specimen; however, lymph/vascular invasion was reported on the previous needle core biopsy ([id number]). Ductal carcinoma in situ: Not identified. Lobular neoplasia: Not identified. Tumor focality: Unifocal. Treatment effect: Not applicable. Extent of tumor:  Skin: Tumor directly extends into dermis. Nipple: Not involved. Skeletal muscle: Not received. Lymph nodes: Examined: 0 Sentinel. 5 Non-sentinel. 5 Total. Lymph nodes with metastasis: 0. Isolated tumor cells (< 0.2 mm): 0. 2 of 4 FINAL for [facility] [place] ([id number]) Microscopic Comment(continued) Micrometastasis: (> 0.2 mm and < 2.0 mm): 0. Macrometastasis: (> 2.0 mm): 0. Extracapsular extension: Not applicable. Breast prognostic profile: Performed on previous case [id number] Estrogen receptor: 0%, negative. Progesterone receptor: 1%, positive. Her 2 neu: 1.54 ratio, negative. Ki-67: 30%. Non-neoplastic breast: Unremarkable. TNM: pT2, pN0. Comments: As both Her-2 neu and estrogen receptor were previously reported as negative, a quantitative estrogen receptor and a Her-2 neu will be repeated on representative tumor to be reported in an addendum to follow. (RH:kh 05-11-15) Willeen Niece MD Pathologist, Electronic Signature (Case signed 05/11/2015) Specimen Gross and Clinical Information Breast prognostic profile: Performed on previous case [id number] Estrogen receptor: 0%, negative. Progesterone receptor: 1%, positive. Her 2 neu: 1.54 ratio, negative. Ki-67: 30%. Non-neoplastic breast: Unremarkable. TNM: pT2, pN0. Comments: As both Her-2 neu and estrogen receptor were previously  reported as negative, a quantitative estrogen receptor and a Her-2 neu will be repeated on representative tumor to be reported in an addendum to follow. 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 We discussed the Estrogen  receptor negativity of her tumor. Her tumor is however progesterone receptor positive and therefore she may derive some benefit from endocrine therapy. Given her bone health and the fact that she has had a hysterectomy we ultimately opted for tamoxifen therapy.  We discussed the risks of tamoxifen therapy including blood clots. As stated she has had a hysterectomy and therefore the uterine risks do not apply.  I would like to see her back in 4-5 weeks to assess her tolerance. They were advised to call in the interim with any questions or problems.  All questions were answered. The patient knows to call the clinic with any problems, questions or concerns.   This note was electronically signed.   This document serves as a record of services personally performed by Ancil Linsey, MD. It was created on her behalf by Janace Hoard, a trained medical scribe. The creation of this record is based on the scribe's personal observations and the provider's statements to them. This document has been checked and approved by the attending provider.  I have reviewed the above documentation for accuracy and completeness, and I agree with the above.  Kelby Fam. Whitney Muse, MD

## 2015-05-16 NOTE — Patient Instructions (Addendum)
..  [place] at [facility] Discharge Instructions  RECOMMENDATIONS MADE BY THE CONSULTANT AND ANY TEST RESULTS WILL BE SENT TO YOUR REFERRING PHYSICIAN.  Seen and discussion with Dr. Whitney Muse. Call the cancer center with any questions and/or concerns that you have.  Lab work and Follow up appt in 6 weeks.     Thank you for choosing [place] at [facility] to provide your oncology and hematology care.  To afford each patient quality time with our provider, please arrive at least 15 minutes before your scheduled appointment time.    You need to re-schedule your appointment should you arrive 10 or more minutes late.  We strive to give you quality time with our providers, and arriving late affects you and other patients whose appointments are after yours.  Also, if you no show three or more times for appointments you may be dismissed from the clinic at the providers discretion.     Again, thank you for choosing [facility].  Our hope is that these requests will decrease the amount of time that you wait before being seen by our physicians.       _____________________________________________________________  Should you have questions after your visit to [facility], please contact our office at [phone] between the hours of 8:30 a.m. and 4:30 p.m.  Voicemails left after 4:30 p.m. will not be returned until the following business day.  For prescription refill requests, have your pharmacy contact our office.

## 2015-06-16 ENCOUNTER — Encounter (HOSPITAL_COMMUNITY): Payer: Self-pay | Admitting: Hematology & Oncology

## 2015-06-27 ENCOUNTER — Encounter (HOSPITAL_COMMUNITY): Payer: Self-pay | Admitting: Oncology

## 2015-06-27 ENCOUNTER — Encounter (HOSPITAL_BASED_OUTPATIENT_CLINIC_OR_DEPARTMENT_OTHER): Payer: Medicare Other

## 2015-06-27 ENCOUNTER — Encounter (HOSPITAL_COMMUNITY): Payer: Medicare Other | Attending: Hematology & Oncology | Admitting: Oncology

## 2015-06-27 VITALS — BP 96/39 | HR 88 | Temp 99.4°F | Resp 14

## 2015-06-27 DIAGNOSIS — C50912 Malignant neoplasm of unspecified site of left female breast: Secondary | ICD-10-CM

## 2015-06-27 DIAGNOSIS — N390 Urinary tract infection, site not specified: Secondary | ICD-10-CM | POA: Diagnosis not present

## 2015-06-27 LAB — COMPREHENSIVE METABOLIC PANEL
ALT: 12 U/L — AB (ref 14–54)
AST: 23 U/L (ref 15–41)
Albumin: 2.9 g/dL — ABNORMAL LOW (ref 3.5–5.0)
Alkaline Phosphatase: 58 U/L (ref 38–126)
Anion gap: 9 (ref 5–15)
BILIRUBIN TOTAL: 0.4 mg/dL (ref 0.3–1.2)
BUN: 19 mg/dL (ref 6–20)
CO2: 27 mmol/L (ref 22–32)
Calcium: 9 mg/dL (ref 8.9–10.3)
Chloride: 101 mmol/L (ref 101–111)
Creatinine, Ser: 1.05 mg/dL — ABNORMAL HIGH (ref 0.44–1.00)
GFR calc Af Amer: 54 mL/min — ABNORMAL LOW (ref >60)
GFR, EST NON AFRICAN AMERICAN: 46 mL/min — AB (ref >60)
Glucose, Bld: 252 mg/dL — ABNORMAL HIGH (ref 65–99)
Potassium: 4.2 mmol/L (ref 3.5–5.1)
Sodium: 137 mmol/L (ref 135–145)
TOTAL PROTEIN: 6.7 g/dL (ref 6.5–8.1)

## 2015-06-27 LAB — CBC WITH DIFFERENTIAL/PLATELET
BASOS ABS: 0 10*3/uL (ref 0.0–0.1)
Basophils Relative: 0 %
Eosinophils Absolute: 0.2 10*3/uL (ref 0.0–0.7)
Eosinophils Relative: 1 %
HEMATOCRIT: 38.5 % (ref 36.0–46.0)
Hemoglobin: 12 g/dL (ref 12.0–15.0)
LYMPHS PCT: 13 %
Lymphs Abs: 2.4 10*3/uL (ref 0.7–4.0)
MCH: 29.3 pg (ref 26.0–34.0)
MCHC: 31.2 g/dL (ref 30.0–36.0)
MCV: 93.9 fL (ref 78.0–100.0)
Monocytes Absolute: 0.7 10*3/uL (ref 0.1–1.0)
Monocytes Relative: 4 %
NEUTROS ABS: 14.5 10*3/uL — AB (ref 1.7–7.7)
Neutrophils Relative %: 82 %
Platelets: 295 10*3/uL (ref 150–400)
RBC: 4.1 MIL/uL (ref 3.87–5.11)
RDW: 13.1 % (ref 11.5–15.5)
WBC: 17.8 10*3/uL — AB (ref 4.0–10.5)

## 2015-06-27 MED ORDER — NITROFURANTOIN MONOHYD MACRO 100 MG PO CAPS: 100.0000 mg | ORAL_CAPSULE | Freq: Two times a day (BID) | ORAL | Status: DC

## 2015-06-27 NOTE — Progress Notes (Signed)
Sabrina Cipro, MD [address] [name] [id number]  Breast cancer, left - Plan: CBC with Differential, Comprehensive metabolic panel  UTI (lower urinary tract infection) - Plan: nitrofurantoin, macrocrystal-monohydrate, (MACROBID) 100 MG capsule, DISCONTINUED: nitrofurantoin, macrocrystal-monohydrate, (MACROBID) 100 MG capsule  CURRENT THERAPY: Tamoxifen 20 mg daily beginning on 05/22/2015.  INTERVAL HISTORY: [name] 81 y.o. female returns for followup of Stage IIA  Left breast cancer, ER-, PR 1%+, Her2 -.    Breast cancer   03/20/2015 Initial Diagnosis Breast, left, needle core biopsy, upper outer quadrant - INVASIVE DUCTAL CARCINOMA. - LYMPHOVASCULAR INVASION IS IDENTIFIED.   05/09/2015 Definitive Surgery Diagnosis 1. Breast, simple mastectomy, Left - INVASIVE GRADE III DUCTAL CARCINOMA, SPANNING 4.8 CM IN GREATEST DIMENSION. - MARGINS ARE NEGATIVE. - SEE ONCOLOGY TEMPLATE. 2. Lymph nodes, regional resection, Left axillary contents - FIVE BENIGN LYMP   05/22/2015 -  Anti-estrogen oral therapy Tamoxifen 20 mg daily     I personally reviewed and went over laboratory results with the patient.  The results are noted within this dictation.  Labs will be updated today.  She is tolerating Tamoxifen well.  She does note some hot flashes, but she reports that they are not too bad.  She denies any arthralgias or myalgias above baseline.  She is compliant with Tamoxifen.  Family reports cloudy urine and low grade temperature.  They report that historically, this is an indication that their mother has a UTI.  This, in conjunction with her leukocytosis, I would concur that a UTI is at the top of the differential. They note that UTIs are a major issue for the patient.  She has not needed an antibiotic, they report, within the last 6 months.  She was last treated with Macrobid, according to the family, early this year.  It was tolerated well and was effective.  Renal function is  good, so I will treat with Macrobid again.   Past Medical History  Diagnosis Date  . Asthma   . Hypertension   . Diabetes mellitus without complication   . Acid reflux   . Kyphosis   . Gastric ulcer   . Raynaud disease   . Anemia   . Cancer   . Arthritis     ra    has Rheumatoid arthritis(714.0); HTN (hypertension); Diabetes type 2, controlled; GERD (gastroesophageal reflux disease); and Breast cancer on her problem list.     is allergic to aspirin.  Current Outpatient Prescriptions on File Prior to Visit  Medication Sig Dispense Refill  . albuterol (PROVENTIL HFA) 108 (90 BASE) MCG/ACT inhaler Inhale 2 puffs into the lungs every 6 (six) hours as needed for shortness of breath.     Marland Kitchen albuterol (PROVENTIL) (2.5 MG/3ML) 0.083% nebulizer solution Take 2.5 mg by nebulization daily. With Ipratropium as needed for shortness of breath    . aspirin EC 81 MG tablet Take 81 mg by mouth 2 (two) times daily.     Marland Kitchen atorvastatin (LIPITOR) 20 MG tablet Take 20 mg by mouth at bedtime.    . budesonide-formoterol (SYMBICORT) 160-4.5 MCG/ACT inhaler Inhale 2 puffs into the lungs 2 (two) times daily.    . chlorpheniramine-HYDROcodone (TUSSIONEX PENNKINETIC ER) 10-8 MG/5ML LQCR Take 2.5 mLs by mouth every 12 (twelve) hours as needed for cough. 100 mL 0  . Cholecalciferol (VITAMIN D3) 1000 UNITS CAPS Take 1 capsule by mouth daily.    . fish oil-omega-3 fatty acids 1000 MG capsule Take 1 g by mouth  every morning.    Marland Kitchen ipratropium (ATROVENT) 0.02 % nebulizer solution Take 500 mcg by nebulization daily. With albuterol as needed for shortness of breath    . linagliptin (TRADJENTA) 5 MG TABS tablet Take 5 mg by mouth every morning.    . metFORMIN (GLUCOPHAGE) 500 MG tablet Take 500-1,000 mg by mouth 2 (two) times daily. 1 in the morning and 2 at bedtime    . metoprolol succinate (TOPROL-XL) 25 MG 24 hr tablet Take 25 mg by mouth daily.    . Multiple Vitamins-Minerals (CENTRUM SILVER ADULT 50+ PO) Take 1  tablet by mouth every morning.     . ondansetron (ZOFRAN ODT) 4 MG disintegrating tablet 26m ODT q4 hours prn nausea/vomit (Patient taking differently: Take 4 mg by mouth every 4 (four) hours as needed for nausea or vomiting. ) 12 tablet 0  . pantoprazole (PROTONIX) 40 MG tablet Take 40 mg by mouth daily.    . Polyvinyl Alcohol-Povidone (CLEAR EYES ALL SEASONS) 5-6 MG/ML SOLN Apply 1 drop to eye every morning.     . predniSONE (DELTASONE) 5 MG tablet Take 5 mg by mouth daily with breakfast.    . raloxifene (EVISTA) 60 MG tablet Take 60 mg by mouth every morning.    . tamoxifen (NOLVADEX) 20 MG tablet Take 1 tablet (20 mg total) by mouth daily. 30 tablet 3  . traMADol (ULTRAM) 50 MG tablet Take 1 tablet (50 mg total) by mouth every 6 (six) hours as needed (pain). 30 tablet 1   No current facility-administered medications on file prior to visit.    Past Surgical History  Procedure Laterality Date  . Hernia repair    . Breast surgery    . Mastectomy    . Simple mastectomy with axillary sentinel node biopsy Left 05/09/2015    Procedure: LEFT SIMPLE MASTECTOMY;  Surgeon: TErroll Luna MD;  [place] [facility]  Service: General;  Laterality: Left;    Denies any headaches, dizziness, double vision, fevers, chills, night sweats, nausea, vomiting, diarrhea, constipation, chest pain, heart palpitations, shortness of breath, blood in stool, black tarry stool, urinary pain, urinary burning, urinary frequency, hematuria.   PHYSICAL EXAMINATION  ECOG PERFORMANCE STATUS: 2 - Symptomatic, <50% confined to bed  Filed Vitals:   06/27/15 1314  BP: 96/39  Pulse: 88  Temp: 99.4 F (37.4 C)  Resp: 14    GENERAL:alert, no distress, well nourished, well developed, comfortable, cooperative, smiling and accompanied by 4 daughters SKIN: skin color, texture, turgor are normal, no rashes or significant lesions HEAD: Normocephalic, No masses, lesions, tenderness or abnormalities EYES: normal, PERRLA, EOMI,  Conjunctiva are pink and non-injected EARS: External ears normal OROPHARYNX:mucous membranes are moist  NECK: supple, trachea midline LYMPH:  not examined BREAST:not examined LUNGS: clear to auscultation  HEART: regular rate & rhythm, no murmurs and no gallops ABDOMEN:abdomen soft, non-tender and normal bowel sounds BACK: Back symmetric, no curvature., Kyphotic. EXTREMITIES:less then 2 second capillary refill, no joint deformities, effusion, or inflammation, no skin discoloration, no cyanosis, ulnar deviation, DIP contractures, other findings of RA. NEURO: alert & oriented x 3 with fluent speech, no focal motor/sensory deficits   LABORATORY DATA: CBC    Component Value Date/Time   WBC 17.8* 06/27/2015 1244   RBC 4.10 06/27/2015 1244   HGB 12.0 06/27/2015 1244   HCT 38.5 06/27/2015 1244   PLT 295 06/27/2015 1244   MCV 93.9 06/27/2015 1244   MCH 29.3 06/27/2015 1244   MCHC 31.2 06/27/2015 1244   RDW 13.1  06/27/2015 1244   LYMPHSABS 2.4 06/27/2015 1244   MONOABS 0.7 06/27/2015 1244   EOSABS 0.2 06/27/2015 1244   BASOSABS 0.0 06/27/2015 1244      Chemistry      Component Value Date/Time   NA 137 06/27/2015 1244   K 4.2 06/27/2015 1244   CL 101 06/27/2015 1244   CO2 27 06/27/2015 1244   BUN 19 06/27/2015 1244   CREATININE 1.05* 06/27/2015 1244      Component Value Date/Time   CALCIUM 9.0 06/27/2015 1244   ALKPHOS 58 06/27/2015 1244   AST 23 06/27/2015 1244   ALT 12* 06/27/2015 1244   BILITOT 0.4 06/27/2015 1244        PENDING LABS:   RADIOGRAPHIC STUDIES:  No results found.   PATHOLOGY:    ASSESSMENT AND PLAN:  Breast cancer Stage IIA  Left breast cancer, ER-, PR 1%+, Her2 -.  S/P left breast mastectomy on 05/09/2015.  Due to inability to lay flat, she is not a candidate for radiation therapy.  She is a poor systemic chemotherapy candidate as well.  Currently on Tamoxifen 20 mg daily.  Staging completed.  Oncology history developed.   Labs today as  ordered: CBC diff, CMET, Vit D.  Labs in 3 months: CBC diff, CMET  Tamoxifen compliance encouraged.  She admits to hot flashes but these are manageable.  She denies arthralgias and myalgias above baseline.  Constellation of signs and symptoms raises the suspicion for UTI (cloudy urine, low grade temperature at home, elevated WBC today).  As a result, I will treat with Macrobid x 5 days.  The patient's family reports that she is incontinent of urine and therefore, UA with reflex will be techinically difficult to ascertain in the clinic.  I will treat empirically.  Family reports that the patient is incontinent and recurrent UTIs seems to be a major issue for this patient's QOL.  I would consider long-term prophylaxis with antibiotic such as 1/2 Septra DS nightly.  I will defer to her primary care provider.  Return in 3 months for follow-up.   THERAPY PLAN:  Continue Tamoxifen daily.  If hot flashes worsen, will consider adding low-dose antidepressant to see if it is effective for hot flash control.  NCCN guidelines recommends the following surveillance for invasive breast cancer:  A. History and Physical exam every 4-6 months for 5 years and then every 12 months.  B. Mammography every 12 months  C. Women on Tamoxifen: annual gynecologic assessment every 12 months if uterus is present.  D. Women on aromatase inhibitor or who experience ovarian failure secondary to treatment should have monitoring of bone health with a bone mineral density determination at baseline and periodically thereafter.  E. Assess and encourage adherence to adjuvant endocrine therapy.  F. Evidence suggests that active lifestyle and achieving and maintaining an ideal body weight (20-25 BMI) may lead to optimal breast cancer outcomes.  NCCN guidelines recommends monitoring for the following for those patients on Tamoxifen/Raloxifene Therapy:  A. Asymptomatic   1. Continue risk-reduction agent   2. Continue follow-up  B.  Hot-flashes or other risk-reduction, agent related symptoms   1. Symptomatic treatment.    2. If persists, re-evaluate role of risk-reduction agent   3. Continue risk-reduction agent- Continue follow-up  C. Abnormal vaginal bleeding   1. Prompt evaluation for endometrial cancer if uterus intact    A. If endometrial pathology found, re-initiation of Tamoxifen may be considered after hysterectomy if early-stage disease     1.  Continue follow-up    B. If no endometrial pathology (carcinoma or hyperplasia with or without atypia) found, continue Tamoxifen and re-evaluate if symptoms persist or recur.     1. Continue follow-up  D. Anticipated elective surgery   1. Consider discontinuing Tamoxifen or Raloxifene prior to elective surgery   2. Resume Tamoxifen or Raloxifene postoperatively when ambulation is normal  E. Deep vein thrombosis, pulmonary embolism, cerebrovascular accident, or prolonged immobilization   1. Discontinue tamoxifen or raloxifene, treat underlying condition.   All questions were answered. The patient knows to call the clinic with any problems, questions or concerns. We can certainly see the patient much sooner if necessary.  Patient and plan discussed with Dr. Ancil Linsey and she is in agreement with the aforementioned.   This note is electronically signed by: Doy Mince 06/27/2015 2:28 PM

## 2015-06-27 NOTE — Assessment & Plan Note (Addendum)
Stage IIA  Left breast cancer, ER-, PR 1%+, Her2 -.  S/P left breast mastectomy on 05/09/2015.  Due to inability to lay flat, she is not a candidate for radiation therapy.  She is a poor systemic chemotherapy candidate as well.  Currently on Tamoxifen 20 mg daily.  Staging completed.  Oncology history developed.   Labs today as ordered: CBC diff, CMET, Vit D.  Labs in 3 months: CBC diff, CMET  Tamoxifen compliance encouraged.  She admits to hot flashes but these are manageable.  She denies arthralgias and myalgias above baseline.  Constellation of signs and symptoms raises the suspicion for UTI (cloudy urine, low grade temperature at home, elevated WBC today).  As a result, I will treat with Macrobid x 5 days.  The patient's family reports that she is incontinent of urine and therefore, UA with reflex will be techinically difficult to ascertain in the clinic.  I will treat empirically.  Family reports that the patient is incontinent and recurrent UTIs seems to be a major issue for this patient's QOL.  I would consider long-term prophylaxis with antibiotic such as 1/2 Septra DS nightly.  I will defer to her primary care Desirai Traxler.  Return in 3 months for follow-up.

## 2015-06-27 NOTE — Patient Instructions (Signed)
[place] at [facility] Discharge Instructions  RECOMMENDATIONS MADE BY THE CONSULTANT AND ANY TEST RESULTS WILL BE SENT TO YOUR REFERRING PHYSICIAN.  Exam and discussion by Robynn Pane, PA-C Continue tamoxifen Prescription for Macrobid - take as directed Report any new lumps, bone pain, shortness of breath or other symptoms.  Follow-up in 3 months with labs and office visit.  Thank you for choosing [place] at [facility] to provide your oncology and hematology care.  To afford each patient quality time with our provider, please arrive at least 15 minutes before your scheduled appointment time.    You need to re-schedule your appointment should you arrive 10 or more minutes late.  We strive to give you quality time with our providers, and arriving late affects you and other patients whose appointments are after yours.  Also, if you no show three or more times for appointments you may be dismissed from the clinic at the providers discretion.     Again, thank you for choosing [facility].  Our hope is that these requests will decrease the amount of time that you wait before being seen by our physicians.       _____________________________________________________________  Should you have questions after your visit to [facility], please contact our office at [phone] between the hours of 8:30 a.m. and 4:30 p.m.  Voicemails left after 4:30 p.m. will not be returned until the following business day.  For prescription refill requests, have your pharmacy contact our office.

## 2015-06-28 LAB — VITAMIN D 25 HYDROXY (VIT D DEFICIENCY, FRACTURES): Vit D, 25-Hydroxy: 75.6 ng/mL (ref 30.0–100.0)

## 2015-06-28 NOTE — Progress Notes (Signed)
Labs drawn

## 2015-06-30 ENCOUNTER — Other Ambulatory Visit: Payer: Self-pay

## 2015-06-30 ENCOUNTER — Inpatient Hospital Stay (HOSPITAL_COMMUNITY)
Admission: EM | Admit: 2015-06-30 | Discharge: 2015-07-01 | DRG: 872 | Disposition: A | Discharge status: Home / Self Care | Payer: Medicare Other | Attending: Internal Medicine | Admitting: Internal Medicine

## 2015-06-30 ENCOUNTER — Encounter (HOSPITAL_COMMUNITY): Payer: Self-pay | Admitting: *Deleted

## 2015-06-30 ENCOUNTER — Emergency Department (HOSPITAL_COMMUNITY): Payer: Medicare Other

## 2015-06-30 DIAGNOSIS — N3000 Acute cystitis without hematuria: Secondary | ICD-10-CM

## 2015-06-30 DIAGNOSIS — C50919 Malignant neoplasm of unspecified site of unspecified female breast: Secondary | ICD-10-CM

## 2015-06-30 DIAGNOSIS — Z825 Family history of asthma and other chronic lower respiratory diseases: Secondary | ICD-10-CM

## 2015-06-30 DIAGNOSIS — M1990 Unspecified osteoarthritis, unspecified site: Secondary | ICD-10-CM | POA: Diagnosis not present

## 2015-06-30 DIAGNOSIS — Z7952 Long term (current) use of systemic steroids: Secondary | ICD-10-CM | POA: Diagnosis not present

## 2015-06-30 DIAGNOSIS — A419 Sepsis, unspecified organism: Principal | ICD-10-CM | POA: Diagnosis present

## 2015-06-30 DIAGNOSIS — J45909 Unspecified asthma, uncomplicated: Secondary | ICD-10-CM | POA: Diagnosis not present

## 2015-06-30 DIAGNOSIS — R6520 Severe sepsis without septic shock: Secondary | ICD-10-CM

## 2015-06-30 DIAGNOSIS — I10 Essential (primary) hypertension: Secondary | ICD-10-CM | POA: Diagnosis present

## 2015-06-30 DIAGNOSIS — Z9010 Acquired absence of unspecified breast and nipple: Secondary | ICD-10-CM | POA: Diagnosis present

## 2015-06-30 DIAGNOSIS — E872 Acidosis, unspecified: Secondary | ICD-10-CM

## 2015-06-30 DIAGNOSIS — M069 Rheumatoid arthritis, unspecified: Secondary | ICD-10-CM | POA: Diagnosis present

## 2015-06-30 DIAGNOSIS — E119 Type 2 diabetes mellitus without complications: Secondary | ICD-10-CM

## 2015-06-30 DIAGNOSIS — K219 Gastro-esophageal reflux disease without esophagitis: Secondary | ICD-10-CM | POA: Diagnosis present

## 2015-06-30 DIAGNOSIS — Z79810 Long term (current) use of selective estrogen receptor modulators (SERMs): Secondary | ICD-10-CM

## 2015-06-30 DIAGNOSIS — D72829 Elevated white blood cell count, unspecified: Secondary | ICD-10-CM

## 2015-06-30 DIAGNOSIS — R000 Tachycardia, unspecified: Secondary | ICD-10-CM

## 2015-06-30 DIAGNOSIS — N390 Urinary tract infection, site not specified: Secondary | ICD-10-CM | POA: Diagnosis not present

## 2015-06-30 DIAGNOSIS — R509 Fever, unspecified: Secondary | ICD-10-CM | POA: Diagnosis not present

## 2015-06-30 DIAGNOSIS — C50412 Malignant neoplasm of upper-outer quadrant of left female breast: Secondary | ICD-10-CM | POA: Diagnosis present

## 2015-06-30 LAB — GLUCOSE, CAPILLARY
GLUCOSE-CAPILLARY: 242 mg/dL — AB (ref 65–99)
Glucose-Capillary: 388 mg/dL — ABNORMAL HIGH (ref 65–99)
Glucose-Capillary: 330 mg/dL — ABNORMAL HIGH (ref 65–99)

## 2015-06-30 LAB — URINALYSIS, ROUTINE W REFLEX MICROSCOPIC
Bilirubin Urine: NEGATIVE
KETONES UR: NEGATIVE mg/dL
NITRITE: NEGATIVE
PROTEIN: NEGATIVE mg/dL
Specific Gravity, Urine: 1.01 (ref 1.005–1.030)
UROBILINOGEN UA: 0.2 mg/dL (ref 0.0–1.0)
pH: 5.5 (ref 5.0–8.0)

## 2015-06-30 LAB — I-STAT CHEM 8, ED
BUN: 16 mg/dL (ref 6–20)
CHLORIDE: 96 mmol/L — AB (ref 101–111)
Calcium, Ion: 1.13 mmol/L (ref 1.13–1.30)
Creatinine, Ser: 0.9 mg/dL (ref 0.44–1.00)
GLUCOSE: 344 mg/dL — AB (ref 65–99)
HEMATOCRIT: 39 % (ref 36.0–46.0)
HEMOGLOBIN: 13.3 g/dL (ref 12.0–15.0)
POTASSIUM: 4.4 mmol/L (ref 3.5–5.1)
SODIUM: 133 mmol/L — AB (ref 135–145)
TCO2: 23 mmol/L (ref 0–100)

## 2015-06-30 LAB — APTT: aPTT: 51 seconds — ABNORMAL HIGH (ref 24–37)

## 2015-06-30 LAB — CBC WITH DIFFERENTIAL/PLATELET
BASOS ABS: 0 10*3/uL (ref 0.0–0.1)
BASOS PCT: 0 %
EOS ABS: 0 10*3/uL (ref 0.0–0.7)
EOS PCT: 0 %
HCT: 37 % (ref 36.0–46.0)
HEMOGLOBIN: 11.8 g/dL — AB (ref 12.0–15.0)
LYMPHS ABS: 1.2 10*3/uL (ref 0.7–4.0)
Lymphocytes Relative: 7 %
MCH: 29.3 pg (ref 26.0–34.0)
MCHC: 31.9 g/dL (ref 30.0–36.0)
MCV: 91.8 fL (ref 78.0–100.0)
Monocytes Absolute: 1 10*3/uL (ref 0.1–1.0)
Monocytes Relative: 6 %
NEUTROS PCT: 87 %
Neutro Abs: 15.9 10*3/uL — ABNORMAL HIGH (ref 1.7–7.7)
PLATELETS: 225 10*3/uL (ref 150–400)
RBC: 4.03 MIL/uL (ref 3.87–5.11)
RDW: 13.1 % (ref 11.5–15.5)
WBC: 18.2 10*3/uL — AB (ref 4.0–10.5)

## 2015-06-30 LAB — PROCALCITONIN: Procalcitonin: 0.22 ng/mL

## 2015-06-30 LAB — COMPREHENSIVE METABOLIC PANEL
ALT: 13 U/L — AB (ref 14–54)
AST: 21 U/L (ref 15–41)
Albumin: 2.8 g/dL — ABNORMAL LOW (ref 3.5–5.0)
Alkaline Phosphatase: 63 U/L (ref 38–126)
Anion gap: 9 (ref 5–15)
BILIRUBIN TOTAL: 0.3 mg/dL (ref 0.3–1.2)
BUN: 17 mg/dL (ref 6–20)
CHLORIDE: 97 mmol/L — AB (ref 101–111)
CO2: 26 mmol/L (ref 22–32)
CREATININE: 0.9 mg/dL (ref 0.44–1.00)
Calcium: 8.7 mg/dL — ABNORMAL LOW (ref 8.9–10.3)
GFR, EST NON AFRICAN AMERICAN: 56 mL/min — AB (ref >60)
Glucose, Bld: 345 mg/dL — ABNORMAL HIGH (ref 65–99)
Potassium: 4.5 mmol/L (ref 3.5–5.1)
Sodium: 132 mmol/L — ABNORMAL LOW (ref 135–145)
TOTAL PROTEIN: 6.4 g/dL — AB (ref 6.5–8.1)

## 2015-06-30 LAB — PROTIME-INR
INR: 1.01 (ref 0.00–1.49)
PROTHROMBIN TIME: 13.5 s (ref 11.6–15.2)

## 2015-06-30 LAB — URINE MICROSCOPIC-ADD ON

## 2015-06-30 LAB — LACTIC ACID, PLASMA
LACTIC ACID, VENOUS: 2.7 mmol/L — AB (ref 0.5–2.0)
LACTIC ACID, VENOUS: 2.4 mmol/L — AB (ref 0.5–2.0)

## 2015-06-30 LAB — I-STAT CG4 LACTIC ACID, ED: LACTIC ACID, VENOUS: 3.32 mmol/L — AB (ref 0.5–2.0)

## 2015-06-30 MED ORDER — HYDROCORTISONE SOD SUCCINATE 100 MG PF FOR IT USE: 100.0000 mg | Freq: Once | INTRAMUSCULAR | Status: DC

## 2015-06-30 MED ORDER — SODIUM CHLORIDE 0.9 % IV BOLUS (SEPSIS)
1000.0000 mL | Freq: Once | INTRAVENOUS | Status: AC
  Administered 2015-06-30: 1000 mL via INTRAVENOUS

## 2015-06-30 MED ORDER — ONDANSETRON HCL 4 MG/2ML IJ SOLN: 4.0000 mg | Freq: Four times a day (QID) | INTRAMUSCULAR | Status: DC | PRN

## 2015-06-30 MED ORDER — DEXTROSE 5 % IV SOLN
1.0000 g | INTRAVENOUS | Status: DC
  Administered 2015-07-01: 1 g via INTRAVENOUS
  Filled 2015-06-30 (×2): qty 10

## 2015-06-30 MED ORDER — ACETAMINOPHEN 650 MG RE SUPP: 650.0000 mg | Freq: Four times a day (QID) | RECTAL | Status: DC | PRN

## 2015-06-30 MED ORDER — INSULIN ASPART 100 UNIT/ML ~~LOC~~ SOLN
0.0000 [IU] | Freq: Three times a day (TID) | SUBCUTANEOUS | Status: DC
  Administered 2015-06-30: 9 [IU] via SUBCUTANEOUS
  Administered 2015-07-01 (×2): 2 [IU] via SUBCUTANEOUS

## 2015-06-30 MED ORDER — CETYLPYRIDINIUM CHLORIDE 0.05 % MT LIQD: 7.0000 mL | Freq: Two times a day (BID) | OROMUCOSAL | Status: DC

## 2015-06-30 MED ORDER — PANTOPRAZOLE SODIUM 40 MG PO TBEC
40.0000 mg | DELAYED_RELEASE_TABLET | Freq: Every day | ORAL | Status: DC
  Administered 2015-07-01: 40 mg via ORAL
  Filled 2015-06-30: qty 1

## 2015-06-30 MED ORDER — ACETAMINOPHEN 325 MG PO TABS: 650.0000 mg | ORAL_TABLET | Freq: Four times a day (QID) | ORAL | Status: DC | PRN

## 2015-06-30 MED ORDER — TAMOXIFEN CITRATE 10 MG PO TABS
20.0000 mg | ORAL_TABLET | Freq: Every day | ORAL | Status: DC
  Administered 2015-07-01: 20 mg via ORAL
  Filled 2015-06-30: qty 2

## 2015-06-30 MED ORDER — INSULIN ASPART 100 UNIT/ML ~~LOC~~ SOLN
3.0000 [IU] | Freq: Three times a day (TID) | SUBCUTANEOUS | Status: DC
  Administered 2015-07-01 (×2): 3 [IU] via SUBCUTANEOUS

## 2015-06-30 MED ORDER — INSULIN ASPART 100 UNIT/ML ~~LOC~~ SOLN
0.0000 [IU] | Freq: Every day | SUBCUTANEOUS | Status: DC
  Administered 2015-06-30: 2 [IU] via SUBCUTANEOUS

## 2015-06-30 MED ORDER — SENNOSIDES-DOCUSATE SODIUM 8.6-50 MG PO TABS: 1.0000 | ORAL_TABLET | Freq: Every evening | ORAL | Status: DC | PRN

## 2015-06-30 MED ORDER — SODIUM CHLORIDE 0.9 % IV BOLUS (SEPSIS)
250.0000 mL | Freq: Once | INTRAVENOUS | Status: AC
  Administered 2015-06-30: 250 mL via INTRAVENOUS

## 2015-06-30 MED ORDER — LINAGLIPTIN 5 MG PO TABS
5.0000 mg | ORAL_TABLET | Freq: Every morning | ORAL | Status: DC
  Administered 2015-07-01: 5 mg via ORAL
  Filled 2015-06-30: qty 1

## 2015-06-30 MED ORDER — CEFTRIAXONE SODIUM 1 G IJ SOLR
1.0000 g | Freq: Once | INTRAMUSCULAR | Status: AC
  Administered 2015-06-30: 1 g via INTRAVENOUS
  Filled 2015-06-30: qty 10

## 2015-06-30 MED ORDER — HYDROCORTISONE NA SUCCINATE PF 100 MG IJ SOLR
100.0000 mg | Freq: Once | INTRAMUSCULAR | Status: AC
  Administered 2015-06-30: 100 mg via INTRAVENOUS
  Filled 2015-06-30: qty 2

## 2015-06-30 MED ORDER — INFLUENZA VAC SPLIT QUAD 0.5 ML IM SUSY: 0.5000 mL | PREFILLED_SYRINGE | INTRAMUSCULAR | Status: DC

## 2015-06-30 MED ORDER — DEXTROSE 5 % IV SOLN: 2.0000 g | Freq: Once | INTRAVENOUS | Status: DC

## 2015-06-30 MED ORDER — ONDANSETRON HCL 4 MG PO TABS: 4.0000 mg | ORAL_TABLET | Freq: Four times a day (QID) | ORAL | Status: DC | PRN

## 2015-06-30 MED ORDER — ENOXAPARIN SODIUM 40 MG/0.4ML ~~LOC~~ SOLN
40.0000 mg | SUBCUTANEOUS | Status: DC
  Administered 2015-06-30: 40 mg via SUBCUTANEOUS
  Filled 2015-06-30: qty 0.4

## 2015-06-30 MED ORDER — PREDNISONE 10 MG PO TABS
5.0000 mg | ORAL_TABLET | Freq: Every day | ORAL | Status: DC
  Administered 2015-07-01: 5 mg via ORAL
  Filled 2015-06-30: qty 1

## 2015-06-30 MED ORDER — ATORVASTATIN CALCIUM 20 MG PO TABS
20.0000 mg | ORAL_TABLET | Freq: Every day | ORAL | Status: DC
  Administered 2015-06-30: 20 mg via ORAL
  Filled 2015-06-30: qty 1

## 2015-06-30 MED ORDER — SODIUM CHLORIDE 0.9 % IV SOLN
INTRAVENOUS | Status: DC
  Administered 2015-06-30: 1000 mL via INTRAVENOUS
  Administered 2015-07-01: 03:00:00 via INTRAVENOUS

## 2015-06-30 MED ORDER — BUDESONIDE-FORMOTEROL FUMARATE 160-4.5 MCG/ACT IN AERO
2.0000 | INHALATION_SPRAY | Freq: Two times a day (BID) | RESPIRATORY_TRACT | Status: DC
  Administered 2015-07-01: 2 via RESPIRATORY_TRACT
  Filled 2015-06-30: qty 6

## 2015-06-30 NOTE — H&P (Signed)
[facility]          History and Physical    PCP:   Moshe Cipro, MD   EDP: Leo Grosser, M.D.  Chief Complaint:  Cloudy urine, drowsy, fever and chills  HPI: Patient is an 81 year old woman with history significant for hypertension, diabetes, asthma, rheumatoid arthritis maintained on chronic prednisone as well as recent diagnosis of breast cancer status post mastectomy and maintained on tamoxifen presents to the hospital with the above complaints. Patient is currently alert and oriented and able to answer questions for herself although she has 4 daughters at bedside that are very attentive and assist with questions. They state that about 3 days ago they noted a white, stringy appearance to her urine. Upon visiting the cancer center she was prescribed Macrobid. She has been taking this for 2 days, however yesterday and today was noted to be more drowsy, confused, talking about her husband who had passed away 3 years ago, with fevers and chills and her daughter decided to bring her into the hospital for evaluation. Here she was noted to have a UA consistent with UTI, white blood cell count of 18, lactic acid greater than 3, borderline hypotensive and tachycardic. She was given a dose of Rocephin as per sepsis protocol, was fluid resuscitated and we were asked to admit her for further evaluation and management.  Allergies:   Allergies  Allergen Reactions  . Aspirin Other (See Comments)    Cannot take uncoated due to stomach ulcers      Past Medical History  Diagnosis Date  . Asthma   . Hypertension   . Diabetes mellitus without complication   . Acid reflux   . Kyphosis   . Gastric ulcer   . Raynaud disease   . Anemia   . Arthritis     ra  . Cancer     breast cancer    Past Surgical History  Procedure Laterality Date  . Hernia repair    . Breast surgery    . Mastectomy    . Simple mastectomy with axillary sentinel node biopsy Left 05/09/2015   Procedure: LEFT SIMPLE MASTECTOMY;  Surgeon: Erroll Luna, MD;  Location: [facility];  Service: General;  Laterality: Left;    Prior to Admission medications   Medication Sig Start Date End Date Taking? Authorizing Naziyah Tieszen  aspirin EC 81 MG tablet Take 81 mg by mouth 2 (two) times daily.    Yes Historical Patra Gherardi, MD  atorvastatin (LIPITOR) 20 MG tablet Take 20 mg by mouth at bedtime.   Yes Historical Devera Englander, MD  budesonide-formoterol (SYMBICORT) 160-4.5 MCG/ACT inhaler Inhale 2 puffs into the lungs 2 (two) times daily.   Yes Historical Dustan Hyams, MD  Cholecalciferol (VITAMIN D3) 1000 UNITS CAPS Take 1 capsule by mouth daily.   Yes Historical Jaylaa Gallion, MD  fish oil-omega-3 fatty acids 1000 MG capsule Take 1 g by mouth every morning.   Yes Historical Vickye Astorino, MD  linagliptin (TRADJENTA) 5 MG TABS tablet Take 5 mg by mouth every morning.   Yes Historical Kalissa Grays, MD  metFORMIN (GLUCOPHAGE) 500 MG tablet Take 500-1,000 mg by mouth 2 (two) times daily. 1 in the morning and 2 at bedtime   Yes Historical Kevin Mario, MD  metoprolol succinate (TOPROL-XL) 25 MG 24 hr tablet Take 25 mg by mouth daily.   Yes Historical Christon Parada, MD  Multiple Vitamins-Minerals (CENTRUM SILVER ADULT 50+ PO) Take 1 tablet by mouth every morning.  Yes Historical Zanylah Hardie, MD  nitrofurantoin, macrocrystal-monohydrate, (MACROBID) 100 MG capsule Take 1 capsule (100 mg total) by mouth 2 (two) times daily. 06/27/15  Yes Manon Hilding Kefalas, PA-C  pantoprazole (PROTONIX) 40 MG tablet Take 40 mg by mouth daily.   Yes Historical Jalynn Waddell, MD  Polyvinyl Alcohol-Povidone (CLEAR EYES ALL SEASONS) 5-6 MG/ML SOLN Apply 1 drop to eye every morning.    Yes Historical Leela Vanbrocklin, MD  predniSONE (DELTASONE) 5 MG tablet Take 5 mg by mouth daily with breakfast.   Yes Historical Kayana Thoen, MD  raloxifene (EVISTA) 60 MG tablet Take 60 mg by mouth every morning.   Yes Historical Bernice Mullin, MD  tamoxifen (NOLVADEX) 20 MG tablet Take 1 tablet (20 mg total)  by mouth daily. 05/16/15  Yes Patrici Ranks, MD  albuterol (PROVENTIL HFA) 108 (90 BASE) MCG/ACT inhaler Inhale 2 puffs into the lungs every 6 (six) hours as needed for shortness of breath.     Historical Tayia Stonesifer, MD  albuterol (PROVENTIL) (2.5 MG/3ML) 0.083% nebulizer solution Take 2.5 mg by nebulization daily. With Ipratropium as needed for shortness of breath    Historical Irineo Gaulin, MD  chlorpheniramine-HYDROcodone (TUSSIONEX PENNKINETIC ER) 10-8 MG/5ML LQCR Take 2.5 mLs by mouth every 12 (twelve) hours as needed for cough. Patient not taking: Reported on 06/30/2015 10/14/13   Nat Christen, MD  ipratropium (ATROVENT) 0.02 % nebulizer solution Take 500 mcg by nebulization daily. With albuterol as needed for shortness of breath    Historical Remon Quinto, MD  ondansetron (ZOFRAN ODT) 4 MG disintegrating tablet 43m ODT q4 hours prn nausea/vomit Patient taking differently: Take 4 mg by mouth every 4 (four) hours as needed for nausea or vomiting.  10/03/14   JMilton Ferguson MD  traMADol (ULTRAM) 50 MG tablet Take 1 tablet (50 mg total) by mouth every 6 (six) hours as needed (pain). 05/10/15   MRolm Bookbinder MD    Social History:  reports that she has never smoked. She has never used smokeless tobacco. She reports that she does not drink alcohol or use illicit drugs.  Family History  Problem Relation Age of Onset  . Asthma Sister   . Rheum arthritis Daughter     Review of Systems:  Constitutional: Denies diaphoresis. HEENT: Denies photophobia, eye pain, redness, hearing loss, ear pain, congestion, sore throat, rhinorrhea, sneezing, mouth sores, trouble swallowing, neck pain, neck stiffness and tinnitus.   Respiratory: Denies SOB, DOE, cough, chest tightness,  and wheezing.   Cardiovascular: Denies chest pain, palpitations and leg swelling.  Gastrointestinal: Denies nausea, vomiting, abdominal pain, diarrhea, constipation, blood in stool and abdominal distention.  Genitourinary: Positive for  dysuria, urgency, frequency, flank pain and difficulty urinating.  Endocrine: Denies: hot or cold intolerance, sweats, changes in hair or nails, polyuria, polydipsia. Musculoskeletal: Denies myalgias, back pain, joint swelling, arthralgias and gait problem.  Skin: Denies pallor, rash and wound.  Neurological: Denies dizziness, seizures, syncope, weakness, light-headedness, numbness and headaches.  Hematological: Denies adenopathy. Easy bruising, personal or family bleeding history  Psychiatric/Behavioral: Denies suicidal ideation, mood changes, confusion, nervousness, sleep disturbance and agitation   Physical Exam: Blood pressure 108/57, pulse 88, temperature 100.6 F (38.1 C), temperature source Rectal, resp. rate 17, SpO2 96 %. General: Alert, awake, oriented 3, cooperative with exam HEENT: Normocephalic, atraumatic, pupils equal and reactive to light Neck: Supple, no JVD, no lymphadenopathy, no bruits, no goiter. Cardiovascular: Regular rate and rhythm, no murmurs, rubs or gallops. Lungs: Clear to auscultation bilaterally.  Abdomen: Soft, nontender, nondistended, positive bowel sounds, no masses or organomegaly  noted. Extremities: Trace bilateral pitting edema. Neurologic: Grossly intact and nonfocal, moves all 4 spontaneously, had ambulated her.  Labs on Admission:  Results for orders placed or performed during the hospital encounter of 06/30/15 (from the past 48 hour(s))  Comprehensive metabolic panel     Status: Abnormal   Collection Time: 06/30/15 12:23 PM  Result Value Ref Range   Sodium 132 (L) 135 - 145 mmol/L   Potassium 4.5 3.5 - 5.1 mmol/L   Chloride 97 (L) 101 - 111 mmol/L   CO2 26 22 - 32 mmol/L   Glucose, Bld 345 (H) 65 - 99 mg/dL   BUN 17 6 - 20 mg/dL   Creatinine, Ser 0.90 0.44 - 1.00 mg/dL   Calcium 8.7 (L) 8.9 - 10.3 mg/dL   Total Protein 6.4 (L) 6.5 - 8.1 g/dL   Albumin 2.8 (L) 3.5 - 5.0 g/dL   AST 21 15 - 41 U/L   ALT 13 (L) 14 - 54 U/L   Alkaline  Phosphatase 63 38 - 126 U/L   Total Bilirubin 0.3 0.3 - 1.2 mg/dL   GFR calc non Af Amer 56 (L) >60 mL/min   GFR calc Af Amer >60 >60 mL/min    Comment: (NOTE) The eGFR has been calculated using the CKD EPI equation. This calculation has not been validated in all clinical situations. eGFR's persistently <60 mL/min signify possible Chronic Kidney Disease.    Anion gap 9 5 - 15  CBC WITH DIFFERENTIAL     Status: Abnormal   Collection Time: 06/30/15 12:23 PM  Result Value Ref Range   WBC 18.2 (H) 4.0 - 10.5 K/uL   RBC 4.03 3.87 - 5.11 MIL/uL   Hemoglobin 11.8 (L) 12.0 - 15.0 g/dL   HCT 37.0 36.0 - 46.0 %   MCV 91.8 78.0 - 100.0 fL   MCH 29.3 26.0 - 34.0 pg   MCHC 31.9 30.0 - 36.0 g/dL   RDW 13.1 11.5 - 15.5 %   Platelets 225 150 - 400 K/uL   Neutrophils Relative % 87 %   Neutro Abs 15.9 (H) 1.7 - 7.7 K/uL   Lymphocytes Relative 7 %   Lymphs Abs 1.2 0.7 - 4.0 K/uL   Monocytes Relative 6 %   Monocytes Absolute 1.0 0.1 - 1.0 K/uL   Eosinophils Relative 0 %   Eosinophils Absolute 0.0 0.0 - 0.7 K/uL   Basophils Relative 0 %   Basophils Absolute 0.0 0.0 - 0.1 K/uL  I-Stat CG4 Lactic Acid, ED  (not at  [facility])     Status: Abnormal   Collection Time: 06/30/15 12:58 PM  Result Value Ref Range   Lactic Acid, Venous 3.32 (HH) 0.5 - 2.0 mmol/L   Comment NOTIFIED PHYSICIAN   I-stat chem 8, ed     Status: Abnormal   Collection Time: 06/30/15 12:59 PM  Result Value Ref Range   Sodium 133 (L) 135 - 145 mmol/L   Potassium 4.4 3.5 - 5.1 mmol/L   Chloride 96 (L) 101 - 111 mmol/L   BUN 16 6 - 20 mg/dL   Creatinine, Ser 0.90 0.44 - 1.00 mg/dL   Glucose, Bld 344 (H) 65 - 99 mg/dL   Calcium, Ion 1.13 1.13 - 1.30 mmol/L   TCO2 23 0 - 100 mmol/L   Hemoglobin 13.3 12.0 - 15.0 g/dL   HCT 39.0 36.0 - 46.0 %  Urinalysis, Routine w reflex microscopic (not at [facility])     Status: Abnormal   Collection Time: 06/30/15  1:17  PM  Result Value Ref Range   Color, Urine YELLOW YELLOW   APPearance CLOUDY (A)  CLEAR   Specific Gravity, Urine 1.010 1.005 - 1.030   pH 5.5 5.0 - 8.0   Glucose, UA >1000 (A) NEGATIVE mg/dL   Hgb urine dipstick SMALL (A) NEGATIVE   Bilirubin Urine NEGATIVE NEGATIVE   Ketones, ur NEGATIVE NEGATIVE mg/dL   Protein, ur NEGATIVE NEGATIVE mg/dL   Urobilinogen, UA 0.2 0.0 - 1.0 mg/dL   Nitrite NEGATIVE NEGATIVE   Leukocytes, UA MODERATE (A) NEGATIVE  Urine microscopic-add on     Status: Abnormal   Collection Time: 06/30/15  1:17 PM  Result Value Ref Range   Squamous Epithelial / LPF RARE RARE   WBC, UA 21-50 <3 WBC/hpf   RBC / HPF 3-6 <3 RBC/hpf   Bacteria, UA MANY (A) RARE    Radiological Exams on Admission: Dg Chest Port 1 View  06/30/2015   CLINICAL DATA:  Weakness and fever.  Chronic cough.  EXAM: PORTABLE CHEST 1 VIEW  COMPARISON:  10/03/2014  FINDINGS: Mild elevation of the left hemidiaphragm is stable. Both lungs are clear. Heart and mediastinum are within normal limits. No acute bone abnormality.  IMPRESSION: No acute chest abnormality.   Electronically Signed   By: Markus Daft M.D.   On: 06/30/2015 13:06    Assessment/Plan Principal Problem:   Severe sepsis Active Problems:   UTI (urinary tract infection)   Rheumatoid arthritis   HTN (hypertension)   Diabetes type 2, controlled   GERD (gastroesophageal reflux disease)   Breast cancer   Severe sepsis secondary to UTI -Sepsis parameters have improved with IV fluid bolus given in emergency department and first dose of antibiotics. -We'll admit to the hospital, continue IV fluids, continue Rocephin pending culture data. -Continue to trend lactic acid.  Rheumatoid arthritis -Continue chronic prednisone. -Was given a dose of hydrocortisone in the emergency department, did not believe she needs any further stress dose steroids as she is no longer hypotensive. Next  Hypertension -We'll hold blood pressure medications given hypotension on admission.  Type 2 diabetes -Check hemoglobin A1c, start sign  scale insulin. -Hold metformin while in the hospital.  GERD -Continue Protonix.  Breast cancer  -Continue tamoxifen and outpatient follow-up with oncology.  DVT prophylaxis -Subcutaneous Lovenox   CODE STATUS -Full code    Time Spent on Admission: 85 minutes  HERNANDEZ Young,Sabrina [facility] Pager: [phone] 06/30/2015, 6:10 PM

## 2015-06-30 NOTE — Progress Notes (Signed)
Utilization review Completed Kimberly Welborn RN BSN   

## 2015-06-30 NOTE — Progress Notes (Signed)
ANTIBIOTIC CONSULT NOTE-Preliminary  Pharmacy Consult for Rocephin Indication: UTI  Allergies  Allergen Reactions  . Aspirin Other (See Comments)    Cannot take uncoated due to stomach ulcers    Patient Measurements:    Vital Signs: Temp: 100.6 F (38.1 C) (09/24 1302) Temp Source: Rectal (09/24 1302) BP: 108/57 mmHg (09/24 1500) Pulse Rate: 88 (09/24 1500)  Labs:  Recent Labs  06/30/15 1223 06/30/15 1259  WBC 18.2*  --   HGB 11.8* 13.3  PLT 225  --   CREATININE 0.90 0.90    CrCl cannot be calculated (Unknown ideal weight.).  No results for input(s): VANCOTROUGH, VANCOPEAK, VANCORANDOM, GENTTROUGH, GENTPEAK, GENTRANDOM, TOBRATROUGH, TOBRAPEAK, TOBRARND, AMIKACINPEAK, AMIKACINTROU, AMIKACIN in the last 72 hours.   Microbiology: No results found for this or any previous visit (from the past 720 hour(s)).  Medical History: Past Medical History  Diagnosis Date  . Asthma   . Hypertension   . Diabetes mellitus without complication   . Acid reflux   . Kyphosis   . Gastric ulcer   . Raynaud disease   . Anemia   . Arthritis     ra  . Cancer     breast cancer    Anti-infectives    Start     Dose/Rate Route Frequency Ordered Stop   07/01/15 0600  cefTRIAXone (ROCEPHIN) 1 g in dextrose 5 % 50 mL IVPB     1 g 100 mL/hr over 30 Minutes Intravenous Every 24 hours 06/30/15 1847     06/30/15 1815  cefTRIAXone (ROCEPHIN) 2 g in dextrose 5 % 50 mL IVPB  Status:  Discontinued     2 g 100 mL/hr over 30 Minutes Intravenous  Once 06/30/15 1810 06/30/15 1833   06/30/15 1300  cefTRIAXone (ROCEPHIN) 1 g in dextrose 5 % 50 mL IVPB     1 g 100 mL/hr over 30 Minutes Intravenous  Once 06/30/15 1256 06/30/15 1349      Assessment: 81yo female with multiple medical problems.  Asked to initiate Rocephin for UTI, initial dose given.  Goal of Therapy:  Eradicate infection.  Plan:  Preliminary review of pertinent patient information completed.  Protocol will be initiated with  a one-time dose(s) of Rocephin 1gm IV x 1 today and q24hrs.  Forestine Na clinical pharmacist will complete review during morning rounds to assess patient and finalize treatment regimen.  Ena Dawley, [place] 06/30/2015,6:48 PM

## 2015-06-30 NOTE — Progress Notes (Signed)
Discussed duplicate orders with Dr. Jerilee Hoh. States no hold off on the blood cultures and Rocephin since it was already given. Also Late entry received an order for the patients meal.

## 2015-06-30 NOTE — Progress Notes (Addendum)
CRITICAL VALUE ALERT  Critical value received:  Lactic Acid  Date of notification:  06/30/15  Time of notification:  2000  Critical value read back:Yes.    Nurse who received alert:  Harold Barban RN  MD notified (1st page):  Fredirick Maudlin  Time of first page:  2018  MD notified (2nd page):   Time of second page:  Responding MD: Fredirick Maudlin   Time MD responded: 2022

## 2015-06-30 NOTE — ED Notes (Addendum)
Family states aching all over, right ear pain, low grade fever and states, "Her urine is white or cloudy looking" Symptoms first noticed 2 days ago. Placed on Nitrofurantoin 2 days ago while at the cancer center.

## 2015-06-30 NOTE — ED Provider Notes (Signed)
CSN: [id number]     Arrival date & time 06/30/15  1157 History  This chart was scribed for Leo Grosser, MD by Evelene Croon, ED Scribe. This patient was seen in room APA04/APA04 and the patient's care was started 12:12 PM.    Chief Complaint  Patient presents with  . Fatigue    The history is provided by the patient, a relative and medical records (Daughter). No language interpreter was used.     HPI Comments:  [name] is a 81 y.o. female who presents to the Emergency Department with daughter who reports generalized body aches,and low grade fever   for 2 days. Daughter reports TMAX of 100.2 this am ~ 0800. She also reports mild confusion for a few days. Pt complains of right lower abdominal pain, left ear pain and tinnitus. She has been on Macrobid for UTI diagnosed on 06/27/15, she has been taking it for 2 days. Daughter states pt's urine has been cloudy; she also denies SOB. No alleviating factors noted.  Past Medical History  Diagnosis Date  . Asthma   . Hypertension   . Diabetes mellitus without complication   . Acid reflux   . Kyphosis   . Gastric ulcer   . Raynaud disease   . Anemia   . Arthritis     ra  . Cancer     breast cancer   Past Surgical History  Procedure Laterality Date  . Hernia repair    . Breast surgery    . Mastectomy    . Simple mastectomy with axillary sentinel node biopsy Left 05/09/2015    Procedure: LEFT SIMPLE MASTECTOMY;  Surgeon: Erroll Luna, MD;  Location: [facility];  Service: General;  Laterality: Left;   Family History  Problem Relation Age of Onset  . Asthma Sister   . Rheum arthritis Daughter    Social History  Substance Use Topics  . Smoking status: Never Smoker   . Smokeless tobacco: Never Used  . Alcohol Use: No   OB History    No data available     Review of Systems  Constitutional: Positive for fever.  HENT: Positive for ear pain and tinnitus.   Respiratory: Negative for shortness of breath.   Cardiovascular: Negative  for chest pain.  Gastrointestinal: Positive for abdominal pain.  Neurological: Positive for weakness (generalized).  Psychiatric/Behavioral: Positive for confusion.  All other systems reviewed and are negative.   Allergies  Aspirin  Home Medications   Prior to Admission medications   Medication Sig Start Date End Date Taking? Authorizing Romir Klimowicz  albuterol (PROVENTIL HFA) 108 (90 BASE) MCG/ACT inhaler Inhale 2 puffs into the lungs every 6 (six) hours as needed for shortness of breath.     Historical Filomena Pokorney, MD  albuterol (PROVENTIL) (2.5 MG/3ML) 0.083% nebulizer solution Take 2.5 mg by nebulization daily. With Ipratropium as needed for shortness of breath    Historical Etheline Geppert, MD  aspirin EC 81 MG tablet Take 81 mg by mouth 2 (two) times daily.     Historical Nyles Mitton, MD  atorvastatin (LIPITOR) 20 MG tablet Take 20 mg by mouth at bedtime.    Historical Evie Croston, MD  budesonide-formoterol (SYMBICORT) 160-4.5 MCG/ACT inhaler Inhale 2 puffs into the lungs 2 (two) times daily.    Historical Avangeline Stockburger, MD  chlorpheniramine-HYDROcodone (TUSSIONEX PENNKINETIC ER) 10-8 MG/5ML LQCR Take 2.5 mLs by mouth every 12 (twelve) hours as needed for cough. 10/14/13   Nat Christen, MD  Cholecalciferol (VITAMIN D3) 1000 UNITS CAPS Take 1 capsule by mouth  daily.    Historical Andras Grunewald, MD  fish oil-omega-3 fatty acids 1000 MG capsule Take 1 g by mouth every morning.    Historical Jayel Inks, MD  ipratropium (ATROVENT) 0.02 % nebulizer solution Take 500 mcg by nebulization daily. With albuterol as needed for shortness of breath    Historical Shawana Knoch, MD  linagliptin (TRADJENTA) 5 MG TABS tablet Take 5 mg by mouth every morning.    Historical Cathern Tahir, MD  metFORMIN (GLUCOPHAGE) 500 MG tablet Take 500-1,000 mg by mouth 2 (two) times daily. 1 in the morning and 2 at bedtime    Historical Kowen Kluth, MD  metoprolol succinate (TOPROL-XL) 25 MG 24 hr tablet Take 25 mg by mouth daily.    Historical Mercy Malena, MD   Multiple Vitamins-Minerals (CENTRUM SILVER ADULT 50+ PO) Take 1 tablet by mouth every morning.     Historical Kenniyah Sasaki, MD  nitrofurantoin, macrocrystal-monohydrate, (MACROBID) 100 MG capsule Take 1 capsule (100 mg total) by mouth 2 (two) times daily. 06/27/15   Baird Cancer, PA-C  ondansetron (ZOFRAN ODT) 4 MG disintegrating tablet 4mg  ODT q4 hours prn nausea/vomit Patient taking differently: Take 4 mg by mouth every 4 (four) hours as needed for nausea or vomiting.  10/03/14   Milton Ferguson, MD  pantoprazole (PROTONIX) 40 MG tablet Take 40 mg by mouth daily.    Historical Tayllor Breitenstein, MD  Polyvinyl Alcohol-Povidone (CLEAR EYES ALL SEASONS) 5-6 MG/ML SOLN Apply 1 drop to eye every morning.     Historical Delrick Dehart, MD  predniSONE (DELTASONE) 5 MG tablet Take 5 mg by mouth daily with breakfast.    Historical Vinny Taranto, MD  raloxifene (EVISTA) 60 MG tablet Take 60 mg by mouth every morning.    Historical Carlos Heber, MD  tamoxifen (NOLVADEX) 20 MG tablet Take 1 tablet (20 mg total) by mouth daily. 05/16/15   Patrici Ranks, MD  traMADol (ULTRAM) 50 MG tablet Take 1 tablet (50 mg total) by mouth every 6 (six) hours as needed (pain). 05/10/15   Rolm Bookbinder, MD   BP 103/62 mmHg  Pulse 98  Temp(Src) 98.8 F (37.1 C) (Oral)  SpO2 97% Physical Exam  Constitutional: She is oriented to person, place, and time. She appears well-developed and well-nourished. No distress.  HENT:  Head: Normocephalic and atraumatic.  Right Ear: Tympanic membrane normal.  Left Ear: Tympanic membrane normal.  Cardiovascular: Regular rhythm and normal heart sounds.  Tachycardia present.   Pulmonary/Chest: Effort normal and breath sounds normal. No respiratory distress.  Abdominal: Soft. Bowel sounds are normal. She exhibits no distension. There is tenderness (Suprapubic). There is no rebound and no guarding.  Neurological: She is alert and oriented to person, place, and time.  Skin: Skin is warm and dry.  Psychiatric:  She has a normal mood and affect.  Nursing note and vitals reviewed.   ED Course  Procedures   CRITICAL CARE Performed by: Leo Grosser Total critical care time: 30 minutes Critical care time was exclusive of separately billable procedures and treating other patients. Critical care was necessary to treat or prevent imminent or life-threatening deterioration. Critical care was time spent personally by me on the following activities: development of treatment plan with patient and/or surrogate as well as nursing, discussions with consultants, evaluation of patient's response to treatment, examination of patient, obtaining history from patient or surrogate, ordering and performing treatments and interventions, ordering and review of laboratory studies, ordering and review of radiographic studies, pulse oximetry and re-evaluation of patient's condition.   DIAGNOSTIC STUDIES:  Oxygen Saturation is 97%  on RA, normal by my interpretation.    COORDINATION OF CARE:  12:20 PM Discussed treatment plan with pt and family at bedside and they agreed to plan.  Labs Review Labs Reviewed  COMPREHENSIVE METABOLIC PANEL - Abnormal; Notable for the following:    Sodium 132 (*)    Chloride 97 (*)    Glucose, Bld 345 (*)    Calcium 8.7 (*)    Total Protein 6.4 (*)    Albumin 2.8 (*)    ALT 13 (*)    GFR calc non Af Amer 56 (*)    All other components within normal limits  CBC WITH DIFFERENTIAL/PLATELET - Abnormal; Notable for the following:    WBC 18.2 (*)    Hemoglobin 11.8 (*)    Neutro Abs 15.9 (*)    All other components within normal limits  URINALYSIS, ROUTINE W REFLEX MICROSCOPIC (NOT AT [facility]) - Abnormal; Notable for the following:    APPearance CLOUDY (*)    Glucose, UA >1000 (*)    Hgb urine dipstick SMALL (*)    Leukocytes, UA MODERATE (*)    All other components within normal limits  URINE MICROSCOPIC-ADD ON - Abnormal; Notable for the following:    Bacteria, UA MANY (*)    All  other components within normal limits  I-STAT CG4 LACTIC ACID, ED - Abnormal; Notable for the following:    Lactic Acid, Venous 3.32 (*)    All other components within normal limits  I-STAT CHEM 8, ED - Abnormal; Notable for the following:    Sodium 133 (*)    Chloride 96 (*)    Glucose, Bld 344 (*)    All other components within normal limits  CULTURE, BLOOD (ROUTINE X 2)  CULTURE, BLOOD (ROUTINE X 2)  URINE CULTURE  I-STAT TROPOININ, ED    Imaging Review Dg Chest Port 1 View  06/30/2015   CLINICAL DATA:  Weakness and fever.  Chronic cough.  EXAM: PORTABLE CHEST 1 VIEW  COMPARISON:  10/03/2014  FINDINGS: Mild elevation of the left hemidiaphragm is stable. Both lungs are clear. Heart and mediastinum are within normal limits. No acute bone abnormality.  IMPRESSION: No acute chest abnormality.   Electronically Signed   By: Markus Daft M.D.   On: 06/30/2015 13:06   I have personally reviewed and evaluated these images and lab results as part of my medical decision-making.   EKG Interpretation   Date/Time:  Saturday June 30 2015 12:27:43 EDT Ventricular Rate:  92 PR Interval:  152 QRS Duration: 90 QT Interval:  347 QTC Calculation: 429 R Axis:   25 Text Interpretation:  Sinus rhythm Abnormal R-wave progression, early  transition Left ventricular hypertrophy Peaked T waves Confirmed by KNOTT  MD, Quillian Quince ([id number]) on 06/30/2015 12:33:43 PM      MDM   Final diagnoses:  Urinary tract infection without hematuria, site unspecified  Severe sepsis  Lactic acidosis  Leukocytosis  Sinus tachycardia by electrocardiogram    81 year old female with history of immunosuppression on tamoxifen and chronic prednisone therapy presents with typical UTI symptoms with pyuria. She was treated on an outpatient basis for the last 2 days with Macrobid with no improvement of symptoms. She has had increased lethargy and confusion. On arrival she is tachycardic, has leukocytosis and elevated lactic  acid suggestive of severe sepsis requiring multiple fluid boluses and IV antibiotics. No evidence of septic shock currently. Stress dose steroids administered. Hospitalist was consulted for admission and will see the patient in the emergency  department.    I personally performed the services described in this documentation, which was scribed in my presence. The recorded information has been reviewed and is accurate.       Leo Grosser, MD 06/30/15 [phone]

## 2015-06-30 NOTE — Progress Notes (Addendum)
CRITICAL VALUE ALERT  Critical value received: Lactic Acid  Date of notification:  06/30/15  Time of notification:  4320  Critical value read back:Yes.    Nurse who received alert:  T. Marcelino Scot RN  MD notified (1st page):  Fredirick Maudlin  Time of first page:  2309  MD notified (2nd page):  Time of second page:  Responding MD: Fredirick Maudlin   Time MD responded: [phone]

## 2015-07-01 DIAGNOSIS — A419 Sepsis, unspecified organism: Secondary | ICD-10-CM | POA: Diagnosis not present

## 2015-07-01 LAB — CBC
HEMATOCRIT: 26.5 % — AB (ref 36.0–46.0)
HEMOGLOBIN: 8.5 g/dL — AB (ref 12.0–15.0)
MCH: 29.4 pg (ref 26.0–34.0)
MCHC: 32.1 g/dL (ref 30.0–36.0)
MCV: 91.7 fL (ref 78.0–100.0)
Platelets: 210 10*3/uL (ref 150–400)
RBC: 2.89 MIL/uL — AB (ref 3.87–5.11)
RDW: 12.8 % (ref 11.5–15.5)
WBC: 19.2 10*3/uL — ABNORMAL HIGH (ref 4.0–10.5)

## 2015-07-01 LAB — BASIC METABOLIC PANEL
ANION GAP: 7 (ref 5–15)
BUN: 15 mg/dL (ref 6–20)
CO2: 24 mmol/L (ref 22–32)
Calcium: 7.6 mg/dL — ABNORMAL LOW (ref 8.9–10.3)
Chloride: 108 mmol/L (ref 101–111)
Creatinine, Ser: 0.71 mg/dL (ref 0.44–1.00)
GLUCOSE: 222 mg/dL — AB (ref 65–99)
POTASSIUM: 4.1 mmol/L (ref 3.5–5.1)
SODIUM: 139 mmol/L (ref 135–145)

## 2015-07-01 LAB — LACTATE DEHYDROGENASE: LDH: 107 U/L (ref 98–192)

## 2015-07-01 LAB — LACTIC ACID, PLASMA: LACTIC ACID, VENOUS: 0.7 mmol/L (ref 0.5–2.0)

## 2015-07-01 LAB — GLUCOSE, CAPILLARY
GLUCOSE-CAPILLARY: 192 mg/dL — AB (ref 65–99)
Glucose-Capillary: 186 mg/dL — ABNORMAL HIGH (ref 65–99)

## 2015-07-01 MED ORDER — INFLUENZA VAC SPLIT QUAD 0.5 ML IM SUSY
0.5000 mL | PREFILLED_SYRINGE | INTRAMUSCULAR | Status: AC
  Administered 2015-07-01: 0.5 mL via INTRAMUSCULAR

## 2015-07-01 MED ORDER — CIPROFLOXACIN HCL 250 MG PO TABS: 250.0000 mg | ORAL_TABLET | Freq: Two times a day (BID) | ORAL | Status: DC

## 2015-07-01 MED ORDER — DEXTROSE 5 % IV SOLN
INTRAVENOUS | Status: AC
  Filled 2015-07-01: qty 10

## 2015-07-01 NOTE — Progress Notes (Signed)
Patient discharged with instructions, prescription, and care notes.  Verbalized understanding via teach back.  IV was removed and the site was WNL. Patient voiced no further complaints or concerns at the time of discharge.  Appointments scheduled per instructions.  Patient left the floor via w/c with staff and family in stable condition. 

## 2015-07-01 NOTE — Progress Notes (Signed)
Was notified by Lab that the patients HGB dropped from 13.3 to 8.5 since 1 PM 06/30/15.  Patient is having not bleeding and is in no distress, is easy to rouse and is alert when awake.  Vitals are stable. On-call MD paged to notify of the change.  Will continue to monitor.

## 2015-07-01 NOTE — Progress Notes (Signed)
ANTIBIOTIC CONSULT NOTE- follow up  Pharmacy Consult for Rocephin Indication: UTI  Allergies  Allergen Reactions  . Aspirin Other (See Comments)    Cannot take uncoated due to stomach ulcers   Patient Measurements: Height: 5\' 5"  (165.1 cm) Weight: 110 lb 0.2 oz (49.9 kg) IBW/kg (Calculated) : 57  Vital Signs: Temp: 98.1 F (36.7 C) (09/25 0742) Temp Source: Oral (09/25 0742) BP: 160/66 mmHg (09/25 0742) Pulse Rate: 97 (09/25 0742)  Labs:  Recent Labs  06/30/15 1223 06/30/15 1259 07/01/15 0422  WBC 18.2*  --  19.2*  HGB 11.8* 13.3 8.5*  PLT 225  --  210  CREATININE 0.90 0.90 0.71    Estimated Creatinine Clearance: 39 mL/min (by C-G formula based on Cr of 0.71).  No results for input(s): VANCOTROUGH, VANCOPEAK, VANCORANDOM, GENTTROUGH, GENTPEAK, GENTRANDOM, TOBRATROUGH, TOBRAPEAK, TOBRARND, AMIKACINPEAK, AMIKACINTROU, AMIKACIN in the last 72 hours.   Microbiology: Recent Results (from the past 720 hour(s))  Blood Culture (routine x 2)     Status: None (Preliminary result)   Collection Time: 06/30/15 12:23 PM  Result Value Ref Range Status   Specimen Description RIGHT ANTECUBITAL  Final   Special Requests BOTTLES DRAWN AEROBIC ONLY 4CC  Final   Culture NO GROWTH < 24 HOURS  Final   Report Status PENDING  Incomplete  Blood Culture (routine x 2)     Status: None (Preliminary result)   Collection Time: 06/30/15 12:30 PM  Result Value Ref Range Status   Specimen Description BLOOD RIGHT HAND  Final   Special Requests BOTTLES DRAWN AEROBIC ONLY 4CC  Final   Culture NO GROWTH < 24 HOURS  Final   Report Status PENDING  Incomplete   Medical History: Past Medical History  Diagnosis Date  . Asthma   . Hypertension   . Diabetes mellitus without complication   . Acid reflux   . Kyphosis   . Gastric ulcer   . Raynaud disease   . Anemia   . Arthritis     ra  . Cancer     breast cancer    Anti-infectives    Start     Dose/Rate Route Frequency Ordered Stop   07/01/15 0600  cefTRIAXone (ROCEPHIN) 1 g in dextrose 5 % 50 mL IVPB     1 g 100 mL/hr over 30 Minutes Intravenous Every 24 hours 06/30/15 1847     06/30/15 1815  cefTRIAXone (ROCEPHIN) 2 g in dextrose 5 % 50 mL IVPB  Status:  Discontinued     2 g 100 mL/hr over 30 Minutes Intravenous  Once 06/30/15 1810 06/30/15 1833   06/30/15 1300  cefTRIAXone (ROCEPHIN) 1 g in dextrose 5 % 50 mL IVPB     1 g 100 mL/hr over 30 Minutes Intravenous  Once 06/30/15 1256 06/30/15 1349     Assessment: 81yo female with multiple medical problems.  Asked to initiate Rocephin for UTI, initial dose given.  No renal adjustment needed.  Goal of Therapy:  Eradicate infection.  Plan:  Rocephin 1gm IV q24hrs.  Transition to PO abx when improved / appropriate Pharmacy will sign off Duration of therapy per MD  Hart Robinsons A, RPH 07/01/2015,9:15 AM

## 2015-07-01 NOTE — Progress Notes (Signed)
Notified of overnight change in hemoglobin.  D/w nursing.  Patient asymptomatic, lactic acid cleared.  No blood per rectum, no new abdominal pain, and tachycardia actually resolved overnight.  I added on LD to screen for hemolysis, but actually I agree with nursing change is likely dilutional.    Continue to monitor.

## 2015-07-01 NOTE — Discharge Summary (Signed)
Physician Discharge Summary  [name] XAJ:[id number] DOB: 10-28-1925 DOA: 06/30/2015  PCP: Moshe Cipro, MD  Admit date: 06/30/2015 Discharge date: 07/01/2015  Time spent: 45 minutes  Recommendations for Outpatient Follow-up:  -Will be discharged home today. -Advised to follow up with PCP in 1 week, at which time results of urine cx will need to be followed to ensure Cipro was an adequate abx choice.   Discharge Diagnoses:  Principal Problem:   Severe sepsis Active Problems:   UTI (urinary tract infection)   Rheumatoid arthritis   HTN (hypertension)   Diabetes type 2, controlled   GERD (gastroesophageal reflux disease)   Breast cancer   Discharge Condition: Stable and improved  Filed Weights   06/30/15 1830  Weight: 49.9 kg (110 lb 0.2 oz)    History of present illness:  Patient is an 81 year old woman with history significant for hypertension, diabetes, asthma, rheumatoid arthritis maintained on chronic prednisone as well as recent diagnosis of breast cancer status post mastectomy and maintained on tamoxifen presents to the hospital with the above complaints. Patient is currently alert and oriented and able to answer questions for herself although she has 4 daughters at bedside that are very attentive and assist with questions. They state that about 3 days ago they noted a white, stringy appearance to her urine. Upon visiting the cancer center she was prescribed Macrobid. She has been taking this for 2 days, however yesterday and today was noted to be more drowsy, confused, talking about her husband who had passed away 3 years ago, with fevers and chills and her daughter decided to bring her into the hospital for evaluation. Here she was noted to have a UA consistent with UTI, white blood cell count of 18, lactic acid greater than 3, borderline hypotensive and tachycardic. She was given a dose of Rocephin as per sepsis protocol, was fluid resuscitated and we were asked to  admit her for further evaluation and management.  Hospital Course:   Severe Sepsis Secondary to UTI -Sepsis parameters quickly resolved with antibiotics and fluid resuscitation in the ED. -Lactic acid has normalized. -Blood cultures remains negative to date. -Urine Cx is pending. Will DC on cipro for 7 days.  UTI -See above for details. -Cx pending at time of DC and will need to be followed at next appointment with PCP in 1 week.  Rheumatoid Arthritis -Continue prednisone.  HTN -Well controlled.  DM II -Well controlled. -Continue oral hypoglycemic agents.  GERD -Continue PPI.  Breast Cancer -Noted. -Follow up with oncology as scheduled.  Procedures:  None   Consultations:  None  Discharge Instructions  Discharge Instructions    Increase activity slowly    Complete by:  As directed             Medication List    STOP taking these medications        chlorpheniramine-HYDROcodone 10-8 MG/5ML Lqcr  Commonly known as:  TUSSIONEX PENNKINETIC ER     nitrofurantoin (macrocrystal-monohydrate) 100 MG capsule  Commonly known as:  MACROBID      TAKE these medications        aspirin EC 81 MG tablet  Take 81 mg by mouth 2 (two) times daily.     atorvastatin 20 MG tablet  Commonly known as:  LIPITOR  Take 20 mg by mouth at bedtime.     budesonide-formoterol 160-4.5 MCG/ACT inhaler  Commonly known as:  SYMBICORT  Inhale 2 puffs into the lungs 2 (two) times daily.  CENTRUM SILVER ADULT 50+ PO  Take 1 tablet by mouth every morning.     ciprofloxacin 250 MG tablet  Commonly known as:  CIPRO  Take 1 tablet (250 mg total) by mouth 2 (two) times daily.     CLEAR EYES ALL SEASONS 5-6 MG/ML Soln  Generic drug:  Polyvinyl Alcohol-Povidone  Apply 1 drop to eye every morning.     fish oil-omega-3 fatty acids 1000 MG capsule  Take 1 g by mouth every morning.     ipratropium 0.02 % nebulizer solution  Commonly known as:  ATROVENT  Take 500 mcg by  nebulization daily. With albuterol as needed for shortness of breath     metFORMIN 500 MG tablet  Commonly known as:  GLUCOPHAGE  Take 500-1,000 mg by mouth 2 (two) times daily. 1 in the morning and 2 at bedtime     metoprolol succinate 25 MG 24 hr tablet  Commonly known as:  TOPROL-XL  Take 25 mg by mouth daily.     ondansetron 4 MG disintegrating tablet  Commonly known as:  ZOFRAN ODT  4mg  ODT q4 hours prn nausea/vomit     pantoprazole 40 MG tablet  Commonly known as:  PROTONIX  Take 40 mg by mouth daily.     predniSONE 5 MG tablet  Commonly known as:  DELTASONE  Take 5 mg by mouth daily with breakfast.     PROVENTIL HFA 108 (90 BASE) MCG/ACT inhaler  Generic drug:  albuterol  Inhale 2 puffs into the lungs every 6 (six) hours as needed for shortness of breath.     albuterol (2.5 MG/3ML) 0.083% nebulizer solution  Commonly known as:  PROVENTIL  Take 2.5 mg by nebulization daily. With Ipratropium as needed for shortness of breath     raloxifene 60 MG tablet  Commonly known as:  EVISTA  Take 60 mg by mouth every morning.     tamoxifen 20 MG tablet  Commonly known as:  NOLVADEX  Take 1 tablet (20 mg total) by mouth daily.     TRADJENTA 5 MG Tabs tablet  Generic drug:  linagliptin  Take 5 mg by mouth every morning.     traMADol 50 MG tablet  Commonly known as:  ULTRAM  Take 1 tablet (50 mg total) by mouth every 6 (six) hours as needed (pain).     Vitamin D3 1000 UNITS Caps  Take 1 capsule by mouth daily.       Allergies  Allergen Reactions  . Aspirin Other (See Comments)    Cannot take uncoated due to stomach ulcers       Follow-up Information    Follow up with CAPLAN,MICHAEL, MD. Schedule an appointment as soon as possible for a visit in 2 weeks.   Specialty:  Internal Medicine   Contact information:   [address] [place] [phone]        The results of significant diagnostics from this hospitalization (including imaging,  microbiology, ancillary and laboratory) are listed below for reference.    Significant Diagnostic Studies: Dg Chest Port 1 View  06/30/2015   CLINICAL DATA:  Weakness and fever.  Chronic cough.  EXAM: PORTABLE CHEST 1 VIEW  COMPARISON:  10/03/2014  FINDINGS: Mild elevation of the left hemidiaphragm is stable. Both lungs are clear. Heart and mediastinum are within normal limits. No acute bone abnormality.  IMPRESSION: No acute chest abnormality.   Electronically Signed   By: Markus Daft M.D.   On: 06/30/2015 13:06  Microbiology: Recent Results (from the past 240 hour(s))  Blood Culture (routine x 2)     Status: None (Preliminary result)   Collection Time: 06/30/15 12:23 PM  Result Value Ref Range Status   Specimen Description RIGHT ANTECUBITAL  Final   Special Requests BOTTLES DRAWN AEROBIC ONLY 4CC  Final   Culture NO GROWTH < 24 HOURS  Final   Report Status PENDING  Incomplete  Blood Culture (routine x 2)     Status: None (Preliminary result)   Collection Time: 06/30/15 12:30 PM  Result Value Ref Range Status   Specimen Description BLOOD RIGHT HAND  Final   Special Requests BOTTLES DRAWN AEROBIC ONLY 4CC  Final   Culture NO GROWTH < 24 HOURS  Final   Report Status PENDING  Incomplete     Labs: Basic Metabolic Panel:  Recent Labs Lab 06/27/15 1244 06/30/15 1223 06/30/15 1259 07/01/15 0422  NA 137 132* 133* 139  K 4.2 4.5 4.4 4.1  CL 101 97* 96* 108  CO2 27 26  --  24  GLUCOSE 252* 345* 344* 222*  BUN 19 17 16 15   CREATININE 1.05* 0.90 0.90 0.71  CALCIUM 9.0 8.7*  --  7.6*   Liver Function Tests:  Recent Labs Lab 06/27/15 1244 06/30/15 1223  AST 23 21  ALT 12* 13*  ALKPHOS 58 63  BILITOT 0.4 0.3  PROT 6.7 6.4*  ALBUMIN 2.9* 2.8*   No results for input(s): LIPASE, AMYLASE in the last 168 hours. No results for input(s): AMMONIA in the last 168 hours. CBC:  Recent Labs Lab 06/27/15 1244 06/30/15 1223 06/30/15 1259 07/01/15 0422  WBC 17.8* 18.2*  --   19.2*  NEUTROABS 14.5* 15.9*  --   --   HGB 12.0 11.8* 13.3 8.5*  HCT 38.5 37.0 39.0 26.5*  MCV 93.9 91.8  --  91.7  PLT 295 225  --  210   Cardiac Enzymes: No results for input(s): CKTOTAL, CKMB, CKMBINDEX, TROPONINI in the last 168 hours. BNP: BNP (last 3 results) No results for input(s): BNP in the last 8760 hours.  ProBNP (last 3 results) No results for input(s): PROBNP in the last 8760 hours.  CBG:  Recent Labs Lab 06/30/15 1853 06/30/15 2022 06/30/15 2210 07/01/15 0745  GLUCAP 388* 330* 242* 192*       Signed:  Lelon Frohlich  [facility] Pager: [phone] 07/01/2015, 11:25 AM

## 2015-07-02 LAB — HEMOGLOBIN A1C
HEMOGLOBIN A1C: 8.4 % — AB (ref 4.8–5.6)
Mean Plasma Glucose: 194 mg/dL

## 2015-07-05 LAB — CULTURE, BLOOD (ROUTINE X 2)
CULTURE: NO GROWTH
Culture: NO GROWTH

## 2015-09-19 ENCOUNTER — Other Ambulatory Visit (HOSPITAL_COMMUNITY): Payer: Self-pay | Admitting: Hematology & Oncology

## 2015-09-26 ENCOUNTER — Encounter (HOSPITAL_BASED_OUTPATIENT_CLINIC_OR_DEPARTMENT_OTHER): Payer: Medicare Other

## 2015-09-26 ENCOUNTER — Encounter (HOSPITAL_COMMUNITY): Payer: Self-pay | Admitting: Hematology & Oncology

## 2015-09-26 ENCOUNTER — Encounter (HOSPITAL_COMMUNITY): Payer: Medicare Other | Attending: Hematology & Oncology | Admitting: Hematology & Oncology

## 2015-09-26 DIAGNOSIS — C50912 Malignant neoplasm of unspecified site of left female breast: Secondary | ICD-10-CM | POA: Diagnosis present

## 2015-09-26 DIAGNOSIS — C50412 Malignant neoplasm of upper-outer quadrant of left female breast: Secondary | ICD-10-CM | POA: Diagnosis present

## 2015-09-26 DIAGNOSIS — C50919 Malignant neoplasm of unspecified site of unspecified female breast: Secondary | ICD-10-CM

## 2015-09-26 LAB — COMPREHENSIVE METABOLIC PANEL
ALK PHOS: 59 U/L (ref 38–126)
ALT: 19 U/L (ref 14–54)
ANION GAP: 4 — AB (ref 5–15)
AST: 24 U/L (ref 15–41)
Albumin: 3.1 g/dL — ABNORMAL LOW (ref 3.5–5.0)
BILIRUBIN TOTAL: 0.4 mg/dL (ref 0.3–1.2)
BUN: 22 mg/dL — ABNORMAL HIGH (ref 6–20)
CALCIUM: 8.7 mg/dL — AB (ref 8.9–10.3)
CO2: 30 mmol/L (ref 22–32)
Chloride: 104 mmol/L (ref 101–111)
Creatinine, Ser: 0.84 mg/dL (ref 0.44–1.00)
GFR calc non Af Amer: >60 mL/min (ref >60)
Glucose, Bld: 216 mg/dL — ABNORMAL HIGH (ref 65–99)
Potassium: 4.1 mmol/L (ref 3.5–5.1)
SODIUM: 138 mmol/L (ref 135–145)
TOTAL PROTEIN: 6.6 g/dL (ref 6.5–8.1)

## 2015-09-26 LAB — CBC WITH DIFFERENTIAL/PLATELET
Basophils Absolute: 0 10*3/uL (ref 0.0–0.1)
Basophils Relative: 0 %
EOS ABS: 0.1 10*3/uL (ref 0.0–0.7)
EOS PCT: 1 %
HCT: 37.5 % (ref 36.0–46.0)
Hemoglobin: 11.7 g/dL — ABNORMAL LOW (ref 12.0–15.0)
LYMPHS ABS: 1.7 10*3/uL (ref 0.7–4.0)
Lymphocytes Relative: 13 %
MCH: 28.9 pg (ref 26.0–34.0)
MCHC: 31.2 g/dL (ref 30.0–36.0)
MCV: 92.6 fL (ref 78.0–100.0)
MONOS PCT: 4 %
Monocytes Absolute: 0.6 10*3/uL (ref 0.1–1.0)
Neutro Abs: 10.7 10*3/uL — ABNORMAL HIGH (ref 1.7–7.7)
Neutrophils Relative %: 82 %
Platelets: 215 10*3/uL (ref 150–400)
RBC: 4.05 MIL/uL (ref 3.87–5.11)
RDW: 13.4 % (ref 11.5–15.5)
WBC: 13.1 10*3/uL — ABNORMAL HIGH (ref 4.0–10.5)

## 2015-09-26 NOTE — Progress Notes (Signed)
[facility] [place] NOTE  Patient Care Team: Moshe Cipro, MD as PCP - General (Internal Medicine)  CHIEF COMPLAINTS/PURPOSE OF CONSULTATION:  Left Breast Cancer Biopsy on 03/20/2015 with ER- PR+(1% with strong staining intensity) HER2 neu- invasive ductal carcinoma pT2, pN0 Mastectomy on 05/09/2015  HISTORY OF PRESENTING ILLNESS:  [name] 81 y.o. female is here for follow-up of L breast cancer s/p mastectomy, she has ER-, PR+ disease. She is currently on Tamoxifen.   She has had a hysterectomy. [name] returns to the [employer] with several family members. She is confined to a wheelchair due to her significant kyphosis.  She says that Thanksgiving was nice, and she doesn't know what she's doing for Christmas yet.   Her family members say that they refilled her Tamoxifen, and confirm that she's been doing well.  She confirms that she has been eating alright and reaffirms that nothing is new or different.  Her family members say they haven't noticed any problems with her chest, and the site of her surgery looks great. There are no nodules or bumps.  Her family members say that she had a bout of UTI, but it has cleared up.   MEDICAL HISTORY:  Past Medical History  Diagnosis Date  . Asthma   . Hypertension   . Diabetes mellitus without complication ([place])   . Acid reflux   . Kyphosis   . Gastric ulcer   . Raynaud disease   . Anemia   . Arthritis     ra  . Cancer [facility])     breast cancer    SURGICAL HISTORY: Past Surgical History  Procedure Laterality Date  . Hernia repair    . Breast surgery    . Mastectomy    . Simple mastectomy with axillary sentinel node biopsy Left 05/09/2015    Procedure: LEFT SIMPLE MASTECTOMY;  Surgeon: Erroll Luna, MD;  Location: [place] OR;  Service: General;  Laterality: Left;    SOCIAL HISTORY: Social History   Social History  . Marital Status: Married    Spouse Name: N/A  . Number of  Children: N/A  . Years of Education: N/A   Occupational History  . Not on file.   Social History Main Topics  . Smoking status: Never Smoker   . Smokeless tobacco: Never Used  . Alcohol Use: No  . Drug Use: No  . Sexual Activity: No   Other Topics Concern  . Not on file   Social History Narrative  4 daughters 8 grandchildren 73 great grandchildren Widowed Ex-cleaner Non smoker ETOH, none   FAMILY HISTORY: Family History  Problem Relation Age of Onset  . Asthma Sister   . Rheum arthritis Daughter    has no family status information on file.  Father deceased, 36 Mother deceased, young age 13 sisters deceased 2 half-sisters alive 63 brothers   ALLERGIES:  is allergic to aspirin.  MEDICATIONS:  Current Outpatient Prescriptions  Medication Sig Dispense Refill  . albuterol (PROVENTIL HFA) 108 (90 BASE) MCG/ACT inhaler Inhale 2 puffs into the lungs every 6 (six) hours as needed for shortness of breath.     Marland Kitchen albuterol (PROVENTIL) (2.5 MG/3ML) 0.083% nebulizer solution Take 2.5 mg by nebulization daily. With Ipratropium as needed for shortness of breath    . aspirin EC 81 MG tablet Take 81 mg by mouth 2 (two) times daily.     Marland Kitchen atorvastatin (LIPITOR) 20 MG tablet Take 20 mg by mouth at bedtime.    Marland Kitchen  budesonide-formoterol (SYMBICORT) 160-4.5 MCG/ACT inhaler Inhale 2 puffs into the lungs 2 (two) times daily.    . Cholecalciferol (VITAMIN D3) 1000 UNITS CAPS Take 1 capsule by mouth daily.    . ferrous sulfate 325 (65 FE) MG tablet Take 325 mg by mouth daily.  3  . fish oil-omega-3 fatty acids 1000 MG capsule Take 1 g by mouth every morning.    Marland Kitchen ipratropium (ATROVENT) 0.02 % nebulizer solution Take 500 mcg by nebulization daily. With albuterol as needed for shortness of breath    . lactobacillus acidophilus & bulgar (LACTINEX) chewable tablet CHEW 1 TABLET BY MOUTH EVERY DAY  6  . linagliptin (TRADJENTA) 5 MG TABS tablet Take 5 mg by mouth every morning.    . metFORMIN  (GLUCOPHAGE) 500 MG tablet Take 500-1,000 mg by mouth 2 (two) times daily. 1 in the morning and 2 at bedtime    . Multiple Vitamins-Minerals (CENTRUM SILVER ADULT 50+ PO) Take 1 tablet by mouth every morning.     . pantoprazole (PROTONIX) 40 MG tablet Take 40 mg by mouth daily.    . Polyvinyl Alcohol-Povidone (CLEAR EYES ALL SEASONS) 5-6 MG/ML SOLN Apply 1 drop to eye every morning.     . predniSONE (DELTASONE) 5 MG tablet Take 5 mg by mouth daily with breakfast.    . raloxifene (EVISTA) 60 MG tablet Take 60 mg by mouth every morning.    . tamoxifen (NOLVADEX) 20 MG tablet TAKE 1 TABLET EVERY DAY 30 tablet 3  . traMADol (ULTRAM) 50 MG tablet Take 1 tablet (50 mg total) by mouth every 6 (six) hours as needed (pain). 30 tablet 1  . ciprofloxacin (CIPRO) 250 MG tablet Take 1 tablet (250 mg total) by mouth 2 (two) times daily. (Patient not taking: Reported on 09/26/2015) 14 tablet 0  . metoprolol succinate (TOPROL-XL) 25 MG 24 hr tablet Take 25 mg by mouth daily. Reported on 09/26/2015    . ondansetron (ZOFRAN ODT) 4 MG disintegrating tablet 13m ODT q4 hours prn nausea/vomit (Patient not taking: Reported on 09/26/2015) 12 tablet 0   No current facility-administered medications for this visit.    Review of Systems  Constitutional: Negative for fever, chills, weight loss, malaise/fatigue and diaphoresis.  HENT: Positive for hearing loss. Negative for congestion, ear discharge, ear pain, nosebleeds, sore throat and tinnitus.   Eyes: Negative.   Respiratory: Negative.  Negative for stridor.   Cardiovascular: Negative.   Genitourinary: Negative.   Musculoskeletal: Positive for myalgias. Negative for back pain, joint pain and neck pain.       Not ambulatory  Skin: Negative.   Breast:      Well-healed mastectomy site Neurological: Positive for weakness. Negative for dizziness, tingling, tremors, sensory change, speech change, focal weakness, seizures, loss of consciousness and headaches.    Endo/Heme/Allergies: Negative.   Psychiatric/Behavioral: Negative.   All other systems reviewed and are negative.  14 point ROS was done and is otherwise as detailed above or in HPI   PHYSICAL EXAMINATION: ECOG PERFORMANCE STATUS: 2 - Symptomatic, <50% confined to bed   Physical Exam  Constitutional: She is oriented to person, place, and time. No distress.  Frail appearing, in wheelchair, appropriate, "hunched over" (significant kyphosis) she is unable to raise her head. joint deformity of hands noted  HENT:  Head: Normocephalic and atraumatic.  Right Ear: External ear normal.  Left Ear: External ear normal.  Mouth/Throat: Oropharynx is clear and moist.  Eyes: Conjunctivae and EOM are normal. Pupils are equal, round, and  reactive to light.  Neck: Normal range of motion. Neck supple. No tracheal deviation present. No thyromegaly present.  Cardiovascular: Normal rate and regular rhythm.  Exam reveals no gallop and no friction rub.   Pulmonary/Chest: Effort normal and breath sounds normal. No respiratory distress. She has no wheezes. She has no rales.  Breast exam; bilateral mastectomy sites noted. Recent left breast mastectomy site well healed  Abdominal: Soft. She exhibits no distension and no mass. There is no tenderness. There is no rebound and no guarding.  Musculoskeletal: She exhibits no edema.  Lymphadenopathy:    She has no cervical adenopathy.  Neurological: She is alert and oriented to person, place, and time. No cranial nerve deficit.  Skin: Skin is warm and dry. She is not diaphoretic.  Psychiatric: Mood, memory, affect and judgment normal.  Nursing note and vitals reviewed.   LABORATORY DATA:  I have reviewed the data as listed Results for ZELA, SOBIESKI (MRN [id number])   Ref. Range 09/26/2015 12:00  Sodium Latest Ref Range: 135-145 mmol/L 138  Potassium Latest Ref Range: 3.5-5.1 mmol/L 4.1  Chloride Latest Ref Range: 101-111 mmol/L 104  CO2 Latest Ref Range:  22-32 mmol/L 30  BUN Latest Ref Range: 6-20 mg/dL 22 (H)  Creatinine Latest Ref Range: 0.44-1.00 mg/dL 0.84  Calcium Latest Ref Range: 8.9-10.3 mg/dL 8.7 (L)  EGFR (Non-African Amer.) Latest Ref Range: >60 mL/min >60  EGFR (African American) Latest Ref Range: >60 mL/min >60  Glucose Latest Ref Range: 65-99 mg/dL 216 (H)  Anion gap Latest Ref Range: 5-15  4 (L)  Alkaline Phosphatase Latest Ref Range: 38-126 U/L 59  Albumin Latest Ref Range: 3.5-5.0 g/dL 3.1 (L)  AST Latest Ref Range: 15-41 U/L 24  ALT Latest Ref Range: 14-54 U/L 19  Total Protein Latest Ref Range: 6.5-8.1 g/dL 6.6  Total Bilirubin Latest Ref Range: 0.3-1.2 mg/dL 0.4  WBC Latest Ref Range: 4.0-10.5 K/uL 13.1 (H)  RBC Latest Ref Range: 3.87-5.11 MIL/uL 4.05  Hemoglobin Latest Ref Range: 12.0-15.0 g/dL 11.7 (L)  HCT Latest Ref Range: 36.0-46.0 % 37.5  MCV Latest Ref Range: 78.0-100.0 fL 92.6  MCH Latest Ref Range: 26.0-34.0 pg 28.9  MCHC Latest Ref Range: 30.0-36.0 g/dL 31.2  RDW Latest Ref Range: 11.5-15.5 % 13.4  Platelets Latest Ref Range: 150-400 K/uL 215  Neutrophils Latest Units: % 82  Lymphocytes Latest Units: % 13  Monocytes Relative Latest Units: % 4  Eosinophil Latest Units: % 1  Basophil Latest Units: % 0  NEUT# Latest Ref Range: 1.7-7.7 K/uL 10.7 (H)  Lymphocyte # Latest Ref Range: 0.7-4.0 K/uL 1.7  Monocyte # Latest Ref Range: 0.1-1.0 K/uL 0.6  Eosinophils Absolute Latest Ref Range: 0.0-0.7 K/uL 0.1  Basophils Absolute Latest Ref Range: 0.0-0.1 K/uL 0.0   RADIOGRAPHIC STUDIES: I have personally reviewed the radiological images as listed and agreed with the findings in the report.  CLINICAL DATA: Patient has a palpable firm mass in the left breast. Patient has a history of right mastectomy for breast carcinoma in 1979. This patient cannot be positioned for mammography.  EXAM: ULTRASOUND OF THE LEFT BREAST  COMPARISON: None  FINDINGS: On physical exam, there is a firm, relatively fixed, mass  in the upper outer left breast.  Targeted ultrasound is performed, showing hypoechoic mass with irregular partly ill-defined margins in the 2 o'clock position of the left breast, 4 cm from the nipple, measuring 3.8 cm x 2.7 cm by 2.8 cm. In the left axilla there is a single mildly prominent lymph node  measuring 7 mm in short axis with a cortex measuring 3.8 mm in thickness. This lymph node has normal morphology.  IMPRESSION: Large left breast mass likely a primary breast malignancy.  RECOMMENDATION: Ultrasound-guided core needle biopsy of a left breast mass.  I have discussed the findings and recommendations with the patient. Results were also provided in writing at the conclusion of the visit. If applicable, a reminder letter will be sent to the patient regarding the next appointment.  BI-RADS CATEGORY 4: Suspicious abnormality - biopsy should be considered.  Electronically Signed: By: Lajean Manes M.D. On: 03/20/2015 12:52    FINAL DIAGNOSIS Diagnosis 1. Breast, simple mastectomy, Left - INVASIVE GRADE III DUCTAL CARCINOMA, SPANNING 4.8 CM IN GREATEST DIMENSION. - MARGINS ARE NEGATIVE. - SEE ONCOLOGY TEMPLATE. 2. Lymph nodes, regional resection, Left axillary contents - FIVE BENIGN LYMPH NODES WITH NO TUMOR SEEN (0/5).   BREAST, INVASIVE TUMOR, WITH LYMPH NODES PRESENT Specimen, including laterality and lymph node sampling (sentinel, non-sentinel): Left breast with left axillary contents. Procedure: Left mastectomy with left axillary excision. Histologic type: Invasive ductal carcinoma. Grade: 3. Tubule formation: 3. Nuclear pleomorphism: 2. Mitotic: 3. Tumor size (gross measurement): 4.8 cm. Margins: Invasive, distance to closest margin: 0.2 cm (deep margin). Lymphovascular invasion: Definitive lymph/vascular invasion is not identified on the current specimen; however, lymph/vascular invasion was reported on the previous needle core biopsy  ([id number]). Ductal carcinoma in situ: Not identified. Lobular neoplasia: Not identified. Tumor focality: Unifocal. Treatment effect: Not applicable. Extent of tumor: Skin: Tumor directly extends into dermis. Nipple: Not involved. Skeletal muscle: Not received. Lymph nodes: Examined: 0 Sentinel. 5 Non-sentinel. 5 Total. Lymph nodes with metastasis: 0. Isolated tumor cells (< 0.2 mm): 0. 2 of 4 FINAL for [facility], [place] ([id number]) Microscopic Comment(continued) Micrometastasis: (> 0.2 mm and < 2.0 mm): 0. Macrometastasis: (> 2.0 mm): 0. Extracapsular extension: Not applicable. Breast prognostic profile: Performed on previous case [id number] Estrogen receptor: 0%, negative. Progesterone receptor: 1%, positive. Her 2 neu: 1.54 ratio, negative. Ki-67: 30%. Non-neoplastic breast: Unremarkable. TNM: pT2, pN0. Comments: As both Her-2 neu and estrogen receptor were previously reported as negative, a quantitative estrogen receptor and a Her-2 neu will be repeated on representative tumor to be reported in an addendum to follow. (RH:kh 05-11-15) Willeen Niece MD Pathologist, Electronic Signature (Case signed 05/11/2015) Specimen Gross and Clinical Information Breast prognostic profile: Performed on previous case [id number] Estrogen receptor: 0%, negative. Progesterone receptor: 1%, positive. Her 2 neu: 1.54 ratio, negative. Ki-67: 30%. Non-neoplastic breast: Unremarkable. TNM: pT2, pN0. Comments: As both Her-2 neu and estrogen receptor were previously reported as negative, a quantitative estrogen receptor and a Her-2 neu will be repeated on representative tumor to be reported in an addendum to follow. 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  The patient and family would like to continue with ongoing follow-up and her tamoxifen.   We will move her visits out to 4 months. If she needs refills, her family knows to let us know. I think she should keep an eye on her chest and if anything comes up, notify the [place]. All questions were answered. The patient knows to call the clinic with any problems, questions or concerns.  This note was electronically signed.   This document serves as a record of services personally performed by Ancil Linsey, MD. It was created on her behalf by Toni Amend, a trained medical scribe. The creation of this record is based on the scribe's personal observations and the provider's statements to them. This document has been checked and approved by the attending provider.  I have reviewed the above documentation for accuracy and completeness, and I agree with the above.  Kelby Fam. Whitney Muse, MD

## 2015-09-26 NOTE — Patient Instructions (Signed)
[name] at [facility] Discharge Instructions  RECOMMENDATIONS MADE BY THE CONSULTANT AND ANY TEST RESULTS WILL BE SENT TO YOUR REFERRING PHYSICIAN.   Exam completed by Dr Whitney Muse today Return to see the doctor in 4 months Any new lumps or bumps please call us Continue taking tamoxifen daily Please call the clinic if you have any questions or concerns   Thank you for choosing [place] at [facility] to provide your oncology and hematology care.  To afford each patient quality time with our provider, please arrive at least 15 minutes before your scheduled appointment time.    You need to re-schedule your appointment should you arrive 10 or more minutes late.  We strive to give you quality time with our providers, and arriving late affects you and other patients whose appointments are after yours.  Also, if you no show three or more times for appointments you may be dismissed from the clinic at the providers discretion.     Again, thank you for choosing [facility].  Our hope is that these requests will decrease the amount of time that you wait before being seen by our physicians.       _____________________________________________________________  Should you have questions after your visit to [facility], please contact our office at [phone] between the hours of 8:30 a.m. and 4:30 p.m.  Voicemails left after 4:30 p.m. will not be returned until the following business day.  For prescription refill requests, have your pharmacy contact our office.

## 2015-09-26 NOTE — Progress Notes (Signed)
[name] presented for labwork. Labs per MD order drawn via Peripheral Line 23 gauge needle inserted in right forearm  Good blood return present. Procedure without incident.  Needle removed intact. Patient tolerated procedure well.

## 2015-10-03 ENCOUNTER — Emergency Department (HOSPITAL_COMMUNITY): Payer: Medicare Other

## 2015-10-03 ENCOUNTER — Emergency Department (HOSPITAL_COMMUNITY)
Admission: EM | Admit: 2015-10-03 | Discharge: 2015-10-03 | Disposition: A | Discharge status: Home / Self Care | Payer: Medicare Other | Attending: Emergency Medicine | Admitting: Emergency Medicine

## 2015-10-03 ENCOUNTER — Encounter (HOSPITAL_COMMUNITY): Payer: Self-pay | Admitting: Emergency Medicine

## 2015-10-03 DIAGNOSIS — Z7952 Long term (current) use of systemic steroids: Secondary | ICD-10-CM | POA: Diagnosis not present

## 2015-10-03 DIAGNOSIS — Z853 Personal history of malignant neoplasm of breast: Secondary | ICD-10-CM | POA: Insufficient documentation

## 2015-10-03 DIAGNOSIS — R05 Cough: Secondary | ICD-10-CM | POA: Diagnosis present

## 2015-10-03 DIAGNOSIS — Z7982 Long term (current) use of aspirin: Secondary | ICD-10-CM | POA: Diagnosis not present

## 2015-10-03 DIAGNOSIS — J45909 Unspecified asthma, uncomplicated: Secondary | ICD-10-CM | POA: Diagnosis not present

## 2015-10-03 DIAGNOSIS — Z8739 Personal history of other diseases of the musculoskeletal system and connective tissue: Secondary | ICD-10-CM | POA: Insufficient documentation

## 2015-10-03 DIAGNOSIS — K219 Gastro-esophageal reflux disease without esophagitis: Secondary | ICD-10-CM | POA: Insufficient documentation

## 2015-10-03 DIAGNOSIS — I10 Essential (primary) hypertension: Secondary | ICD-10-CM | POA: Diagnosis not present

## 2015-10-03 DIAGNOSIS — H578 Other specified disorders of eye and adnexa: Secondary | ICD-10-CM | POA: Diagnosis not present

## 2015-10-03 DIAGNOSIS — J069 Acute upper respiratory infection, unspecified: Secondary | ICD-10-CM | POA: Diagnosis not present

## 2015-10-03 DIAGNOSIS — E119 Type 2 diabetes mellitus without complications: Secondary | ICD-10-CM | POA: Insufficient documentation

## 2015-10-03 DIAGNOSIS — Z7984 Long term (current) use of oral hypoglycemic drugs: Secondary | ICD-10-CM | POA: Diagnosis not present

## 2015-10-03 DIAGNOSIS — Z79899 Other long term (current) drug therapy: Secondary | ICD-10-CM | POA: Diagnosis not present

## 2015-10-03 DIAGNOSIS — D649 Anemia, unspecified: Secondary | ICD-10-CM | POA: Diagnosis not present

## 2015-10-03 LAB — CBC WITH DIFFERENTIAL/PLATELET
Basophils Absolute: 0 10*3/uL (ref 0.0–0.1)
Basophils Relative: 0 %
Eosinophils Absolute: 0.1 10*3/uL (ref 0.0–0.7)
Eosinophils Relative: 1 %
HEMATOCRIT: 36.5 % (ref 36.0–46.0)
HEMOGLOBIN: 11.4 g/dL — AB (ref 12.0–15.0)
LYMPHS ABS: 1.6 10*3/uL (ref 0.7–4.0)
LYMPHS PCT: 13 %
MCH: 28.8 pg (ref 26.0–34.0)
MCHC: 31.2 g/dL (ref 30.0–36.0)
MCV: 92.2 fL (ref 78.0–100.0)
MONOS PCT: 4 %
Monocytes Absolute: 0.5 10*3/uL (ref 0.1–1.0)
NEUTROS ABS: 10.3 10*3/uL — AB (ref 1.7–7.7)
NEUTROS PCT: 82 %
Platelets: 180 10*3/uL (ref 150–400)
RBC: 3.96 MIL/uL (ref 3.87–5.11)
RDW: 13.6 % (ref 11.5–15.5)
WBC: 12.6 10*3/uL — AB (ref 4.0–10.5)

## 2015-10-03 LAB — BASIC METABOLIC PANEL
Anion gap: 11 (ref 5–15)
BUN: 15 mg/dL (ref 6–20)
CHLORIDE: 98 mmol/L — AB (ref 101–111)
CO2: 30 mmol/L (ref 22–32)
CREATININE: 0.75 mg/dL (ref 0.44–1.00)
Calcium: 9.3 mg/dL (ref 8.9–10.3)
GFR calc non Af Amer: >60 mL/min (ref >60)
Glucose, Bld: 176 mg/dL — ABNORMAL HIGH (ref 65–99)
Potassium: 4.3 mmol/L (ref 3.5–5.1)
Sodium: 139 mmol/L (ref 135–145)

## 2015-10-03 NOTE — ED Notes (Signed)
MD Knapp at bedside updating patient and family.  

## 2015-10-03 NOTE — ED Notes (Signed)
Per family pt has been having some cold symptoms for about 1 week - with cough and congestion getting worst past couple of days- family also concerned that she might have some ear "congestion" as hearing has worstened

## 2015-10-03 NOTE — ED Provider Notes (Signed)
CSN: [id number]     Arrival date & time 10/03/15  1431 History   First MD Initiated Contact with Patient 10/03/15 1714     Chief Complaint  Patient presents with  . Cough    HPI Patient presents to the emergency room for evaluation of cough and cold symptoms that started about a week ago. The patient's had a cough bringing up clear mucus off and on over the past week. Had some nasal congestion as well as some congestion in her years. Not had any fevers or difficulty breathing. Her symptoms have persisted and the family decided to bring her in to make sure she wasn't developing a pneumonia. She does not have any trouble with shortness of breath. Notices any leg swelling. She denies any chest pain. No vomiting or diarrhea. Past Medical History  Diagnosis Date  . Asthma   . Hypertension   . Diabetes mellitus without complication ([place])   . Acid reflux   . Kyphosis   . Gastric ulcer   . Raynaud disease   . Anemia   . Arthritis     ra  . Cancer [facility])     breast cancer   Past Surgical History  Procedure Laterality Date  . Hernia repair    . Breast surgery    . Mastectomy    . Simple mastectomy with axillary sentinel node biopsy Left 05/09/2015    Procedure: LEFT SIMPLE MASTECTOMY;  Surgeon: Erroll Luna, MD;  Location: [place] OR;  Service: General;  Laterality: Left;   Family History  Problem Relation Age of Onset  . Asthma Sister   . Rheum arthritis Daughter    Social History  Substance Use Topics  . Smoking status: Never Smoker   . Smokeless tobacco: Never Used  . Alcohol Use: No   OB History    No data available     Review of Systems  All other systems reviewed and are negative.     Allergies  Aspirin  Home Medications   Prior to Admission medications   Medication Sig Start Date End Date Taking? Authorizing Provider  aspirin EC 81 MG tablet Take 81 mg by mouth daily.    Yes Historical Provider, MD  atorvastatin (LIPITOR) 20 MG tablet Take 20 mg by mouth at  bedtime.   Yes Historical Provider, MD  budesonide-formoterol (SYMBICORT) 160-4.5 MCG/ACT inhaler Inhale 2 puffs into the lungs 2 (two) times daily.   Yes Historical Provider, MD  Chlorpheniramine-Acetaminophen (CORICIDIN HBP COLD/FLU PO) Take 1 capsule by mouth every 6 (six) hours as needed.   Yes Historical Provider, MD  Cholecalciferol (VITAMIN D3) 1000 UNITS CAPS Take 1 capsule by mouth daily.   Yes Historical Provider, MD  ferrous sulfate 325 (65 FE) MG tablet Take 325 mg by mouth daily. 07/23/15  Yes Historical Provider, MD  fish oil-omega-3 fatty acids 1000 MG capsule Take 1 g by mouth every morning.   Yes Historical Provider, MD  lactobacillus acidophilus & bulgar (LACTINEX) chewable tablet CHEW 1 TABLET BY MOUTH EVERY DAY 09/17/15  Yes Historical Provider, MD  linagliptin (TRADJENTA) 5 MG TABS tablet Take 5 mg by mouth every morning.   Yes Historical Provider, MD  metFORMIN (GLUCOPHAGE) 500 MG tablet Take 500-1,000 mg by mouth 2 (two) times daily. 1 in the morning and 2 at bedtime   Yes Historical Provider, MD  Multiple Vitamins-Minerals (CENTRUM SILVER ADULT 50+ PO) Take 1 tablet by mouth every morning.    Yes Historical Provider, MD  pantoprazole (PROTONIX) 40 MG  tablet Take 40 mg by mouth daily.   Yes Historical Provider, MD  Polyvinyl Alcohol-Povidone (CLEAR EYES ALL SEASONS) 5-6 MG/ML SOLN Apply 1 drop to eye every morning.    Yes Historical Provider, MD  predniSONE (DELTASONE) 5 MG tablet Take 5 mg by mouth daily with breakfast.   Yes Historical Provider, MD  raloxifene (EVISTA) 60 MG tablet Take 60 mg by mouth every morning.   Yes Historical Provider, MD  tamoxifen (NOLVADEX) 20 MG tablet TAKE 1 TABLET EVERY DAY 09/24/15  Yes Patrici Ranks, MD  albuterol (PROVENTIL HFA) 108 (90 BASE) MCG/ACT inhaler Inhale 2 puffs into the lungs every 6 (six) hours as needed for shortness of breath.     Historical Provider, MD  albuterol (PROVENTIL) (2.5 MG/3ML) 0.083% nebulizer solution Take 2.5  mg by nebulization daily. With Ipratropium as needed for shortness of breath    Historical Provider, MD  ciprofloxacin (CIPRO) 250 MG tablet Take 1 tablet (250 mg total) by mouth 2 (two) times daily. Patient not taking: Reported on 09/26/2015 07/01/15   Erline Hau, MD  ipratropium (ATROVENT) 0.02 % nebulizer solution Take 500 mcg by nebulization daily. With albuterol as needed for shortness of breath    Historical Provider, MD  ondansetron (ZOFRAN ODT) 4 MG disintegrating tablet 4mg  ODT q4 hours prn nausea/vomit Patient not taking: Reported on 09/26/2015 10/03/14   Milton Ferguson, MD  traMADol (ULTRAM) 50 MG tablet Take 1 tablet (50 mg total) by mouth every 6 (six) hours as needed (pain). 05/10/15   Rolm Bookbinder, MD   BP 138/75 mmHg  Pulse 92  Temp(Src) 98.5 F (36.9 C) (Oral)  Resp 18  Ht 5\' 3"  (1.6 m)  Wt 48.081 kg  BMI 18.78 kg/m2  SpO2 93% Physical Exam  Constitutional: No distress.  Elderly, frail  HENT:  Head: Normocephalic and atraumatic.  Right Ear: Tympanic membrane and external ear normal.  Left Ear: Tympanic membrane and external ear normal.  Mouth/Throat: No oropharyngeal exudate or posterior oropharyngeal edema.  Eyes: Conjunctivae are normal. Right eye exhibits no discharge. Left eye exhibits discharge. No scleral icterus.  Small amount of clear mucoid discharge  Neck: Neck supple. No tracheal deviation present.  Cardiovascular: Normal rate, regular rhythm and intact distal pulses.   Pulmonary/Chest: Effort normal and breath sounds normal. No stridor. No respiratory distress. She has no wheezes. She has no rales.  Abdominal: Soft. Bowel sounds are normal. She exhibits no distension. There is no tenderness. There is no rebound and no guarding.  Musculoskeletal: She exhibits no edema or tenderness.  Neurological: She is alert. She has normal strength. No cranial nerve deficit (no facial droop, extraocular movements intact, no slurred speech) or sensory  deficit. She exhibits normal muscle tone. She displays no seizure activity. Coordination normal.  Skin: Skin is warm and dry. No rash noted.  Psychiatric: She has a normal mood and affect.  Nursing note and vitals reviewed.   ED Course  Procedures  Labs Review Labs Reviewed  CBC WITH DIFFERENTIAL/PLATELET - Abnormal; Notable for the following:    WBC 12.6 (*)    Hemoglobin 11.4 (*)    Neutro Abs 10.3 (*)    All other components within normal limits  BASIC METABOLIC PANEL - Abnormal; Notable for the following:    Chloride 98 (*)    Glucose, Bld 176 (*)    All other components within normal limits    Imaging Review Dg Chest 2 View  10/03/2015  CLINICAL DATA:  Cold symptoms  for by 1 week. Cough, congestion, worsening the past couple days. EXAM: CHEST  2 VIEW COMPARISON:  06/30/2015 FINDINGS: Mild elevation of the left hemidiaphragm. Gaseous distention of the stomach. Lungs are clear. Heart is normal size. No effusions or acute bony abnormality. IMPRESSION: No active cardiopulmonary disease. Electronically Signed   By: Rolm Baptise M.D.   On: 10/03/2015 15:27   I have personally reviewed and evaluated these images and lab results as part of my medical decision-making.   MDM   Final diagnoses:  URI, acute    No PNA on cxr.  Labs are stable.  Most likely a viral uri.  Cotinue OTC meds PRN.  Follow up with PCP to be rechecked 1 week, sooner for worsening symptoms, fever.    Dorie Rank, MD 10/03/15 [phone]

## 2015-10-03 NOTE — Discharge Instructions (Signed)
Cough, Adult °Coughing is a reflex that clears your throat and your airways. Coughing helps to heal and protect your lungs. It is normal to cough occasionally, but a cough that happens with other symptoms or lasts a long time may be a sign of a condition that needs treatment. A cough may last only 2-3 weeks (acute), or it may last longer than 8 weeks (chronic). °CAUSES °Coughing is commonly caused by: °· Breathing in substances that irritate your lungs. °· A viral or bacterial respiratory infection. °· Allergies. °· Asthma. °· Postnasal drip. °· Smoking. °· Acid backing up from the stomach into the esophagus (gastroesophageal reflux). °· Certain medicines. °· Chronic lung problems, including COPD (or rarely, lung cancer). °· Other medical conditions such as heart failure. °HOME CARE INSTRUCTIONS  °Pay attention to any changes in your symptoms. Take these actions to help with your discomfort: °· Take medicines only as told by your health care provider. °· If you were prescribed an antibiotic medicine, take it as told by your health care provider. Do not stop taking the antibiotic even if you start to feel better. °· Talk with your health care provider before you take a cough suppressant medicine. °· Drink enough fluid to keep your urine clear or pale yellow. °· If the air is dry, use a cold steam vaporizer or humidifier in your bedroom or your home to help loosen secretions. °· Avoid anything that causes you to cough at work or at home. °· If your cough is worse at night, try sleeping in a semi-upright position. °· Avoid cigarette smoke. If you smoke, quit smoking. If you need help quitting, ask your health care provider. °· Avoid caffeine. °· Avoid alcohol. °· Rest as needed. °SEEK MEDICAL CARE IF:  °· You have new symptoms. °· You cough up pus. °· Your cough does not get better after 2-3 weeks, or your cough gets worse. °· You cannot control your cough with suppressant medicines and you are losing sleep. °· You  develop pain that is getting worse or pain that is not controlled with pain medicines. °· You have a fever. °· You have unexplained weight loss. °· You have night sweats. °SEEK IMMEDIATE MEDICAL CARE IF: °· You cough up blood. °· You have difficulty breathing. °· Your heartbeat is very fast. °  °This information is not intended to replace advice given to you by your health care provider. Make sure you discuss any questions you have with your health care provider. °  °Document Released: 03/21/2011 Document Revised: 06/13/2015 Document Reviewed: 11/29/2014 °Elsevier Interactive Patient Education ©2016 [employer] ° °Upper Respiratory Infection, Adult °Most upper respiratory infections (URIs) are a viral infection of the air passages leading to the lungs. A URI affects the nose, throat, and upper air passages. The most common type of URI is nasopharyngitis and is typically referred to as "the common cold." °URIs run their course and usually go away on their own. Most of the time, a URI does not require medical attention, but sometimes a bacterial infection in the upper airways can follow a viral infection. This is called a secondary infection. Sinus and middle ear infections are common types of secondary upper respiratory infections. °Bacterial pneumonia can also complicate a URI. A URI can worsen asthma and chronic obstructive pulmonary disease (COPD). Sometimes, these complications can require emergency medical care and may be life threatening.  °CAUSES °Almost all URIs are caused by viruses. A virus is a type of germ and can spread from one   person to another.  °RISKS FACTORS °You may be at risk for a URI if:  °· You smoke.   °· You have chronic heart or lung disease. °· You have a weakened defense (immune) system.   °· You are very young or very old.   °· You have nasal allergies or asthma. °· You work in crowded or poorly ventilated areas. °· You work in health care facilities or schools. °SIGNS AND SYMPTOMS    °Symptoms typically develop 2-3 days after you come in contact with a cold virus. Most viral URIs last 7-10 days. However, viral URIs from the influenza virus (flu virus) can last 14-18 days and are typically more severe. Symptoms may include:  °· Runny or stuffy (congested) nose.   °· Sneezing.   °· Cough.   °· Sore throat.   °· Headache.   °· Fatigue.   °· Fever.   °· Loss of appetite.   °· Pain in your forehead, behind your eyes, and over your cheekbones (sinus pain). °· Muscle aches.   °DIAGNOSIS  °Your health care provider may diagnose a URI by: °· Physical exam. °· Tests to check that your symptoms are not due to another condition such as: °¨ Strep throat. °¨ Sinusitis. °¨ Pneumonia. °¨ Asthma. °TREATMENT  °A URI goes away on its own with time. It cannot be cured with medicines, but medicines may be prescribed or recommended to relieve symptoms. Medicines may help: °· Reduce your fever. °· Reduce your cough. °· Relieve nasal congestion. °HOME CARE INSTRUCTIONS  °· Take medicines only as directed by your health care provider.   °· Gargle warm saltwater or take cough drops to comfort your throat as directed by your health care provider. °· Use a warm mist humidifier or inhale steam from a shower to increase air moisture. This may make it easier to breathe. °· Drink enough fluid to keep your urine clear or pale yellow.   °· Eat soups and other clear broths and maintain good nutrition.   °· Rest as needed.   °· Return to work when your temperature has returned to normal or as your health care provider advises. You may need to stay home longer to avoid infecting others. You can also use a face mask and careful hand washing to prevent spread of the virus. °· Increase the usage of your inhaler if you have asthma.   °· Do not use any tobacco products, including cigarettes, chewing tobacco, or electronic cigarettes. If you need help quitting, ask your health care provider. °PREVENTION  °The best way to protect  yourself from getting a cold is to practice good hygiene.  °· Avoid oral or hand contact with people with cold symptoms.   °· Wash your hands often if contact occurs.   °There is no clear evidence that vitamin C, vitamin E, echinacea, or exercise reduces the chance of developing a cold. However, it is always recommended to get plenty of rest, exercise, and practice good nutrition.  °SEEK MEDICAL CARE IF:  °· You are getting worse rather than better.   °· Your symptoms are not controlled by medicine.   °· You have chills. °· You have worsening shortness of breath. °· You have brown or red mucus. °· You have yellow or brown nasal discharge. °· You have pain in your face, especially when you bend forward. °· You have a fever. °· You have swollen neck glands. °· You have pain while swallowing. °· You have white areas in the back of your throat. °SEEK IMMEDIATE MEDICAL CARE IF:  °· You have severe or persistent: °¨ Headache. °¨ Ear pain. °¨   Sinus pain. °¨ Chest pain. °· You have chronic lung disease and any of the following: °¨ Wheezing. °¨ Prolonged cough. °¨ Coughing up blood. °¨ A change in your usual mucus. °· You have a stiff neck. °· You have changes in your: °¨ Vision. °¨ Hearing. °¨ Thinking. °¨ Mood. °MAKE SURE YOU:  °· Understand these instructions. °· Will watch your condition. °· Will get help right away if you are not doing well or get worse. °  °This information is not intended to replace advice given to you by your health care provider. Make sure you discuss any questions you have with your health care provider. °  °Document Released: 03/18/2001 Document Revised: 02/06/2015 Document Reviewed: 12/28/2013 °Elsevier Interactive Patient Education ©2016 [employer] ° °

## 2015-10-03 NOTE — ED Notes (Signed)
MD Knapp at bedside 

## 2015-10-08 ENCOUNTER — Encounter (HOSPITAL_COMMUNITY): Payer: Self-pay | Admitting: Emergency Medicine

## 2015-10-08 ENCOUNTER — Inpatient Hospital Stay (HOSPITAL_COMMUNITY)
Admission: EM | Admit: 2015-10-08 | Discharge: 2015-10-12 | DRG: 194 | Disposition: A | Discharge status: Home Health | Payer: Medicare Other | Attending: Family Medicine | Admitting: Family Medicine

## 2015-10-08 DIAGNOSIS — Z7952 Long term (current) use of systemic steroids: Secondary | ICD-10-CM

## 2015-10-08 DIAGNOSIS — N390 Urinary tract infection, site not specified: Secondary | ICD-10-CM | POA: Diagnosis present

## 2015-10-08 DIAGNOSIS — J189 Pneumonia, unspecified organism: Secondary | ICD-10-CM | POA: Diagnosis not present

## 2015-10-08 DIAGNOSIS — B9689 Other specified bacterial agents as the cause of diseases classified elsewhere: Secondary | ICD-10-CM | POA: Diagnosis present

## 2015-10-08 DIAGNOSIS — M069 Rheumatoid arthritis, unspecified: Secondary | ICD-10-CM | POA: Diagnosis present

## 2015-10-08 DIAGNOSIS — K219 Gastro-esophageal reflux disease without esophagitis: Secondary | ICD-10-CM | POA: Diagnosis present

## 2015-10-08 DIAGNOSIS — Z79899 Other long term (current) drug therapy: Secondary | ICD-10-CM

## 2015-10-08 DIAGNOSIS — L8990 Pressure ulcer of unspecified site, unspecified stage: Secondary | ICD-10-CM | POA: Insufficient documentation

## 2015-10-08 DIAGNOSIS — Z9010 Acquired absence of unspecified breast and nipple: Secondary | ICD-10-CM

## 2015-10-08 DIAGNOSIS — M4000 Postural kyphosis, site unspecified: Secondary | ICD-10-CM

## 2015-10-08 DIAGNOSIS — Z7984 Long term (current) use of oral hypoglycemic drugs: Secondary | ICD-10-CM

## 2015-10-08 DIAGNOSIS — R509 Fever, unspecified: Secondary | ICD-10-CM | POA: Diagnosis not present

## 2015-10-08 DIAGNOSIS — Z825 Family history of asthma and other chronic lower respiratory diseases: Secondary | ICD-10-CM

## 2015-10-08 DIAGNOSIS — I471 Supraventricular tachycardia: Secondary | ICD-10-CM | POA: Diagnosis not present

## 2015-10-08 DIAGNOSIS — I10 Essential (primary) hypertension: Secondary | ICD-10-CM | POA: Diagnosis present

## 2015-10-08 DIAGNOSIS — C50912 Malignant neoplasm of unspecified site of left female breast: Secondary | ICD-10-CM | POA: Diagnosis present

## 2015-10-08 DIAGNOSIS — N12 Tubulo-interstitial nephritis, not specified as acute or chronic: Secondary | ICD-10-CM | POA: Diagnosis present

## 2015-10-08 DIAGNOSIS — Z7982 Long term (current) use of aspirin: Secondary | ICD-10-CM

## 2015-10-08 DIAGNOSIS — Z7951 Long term (current) use of inhaled steroids: Secondary | ICD-10-CM

## 2015-10-08 DIAGNOSIS — M1990 Unspecified osteoarthritis, unspecified site: Secondary | ICD-10-CM | POA: Diagnosis present

## 2015-10-08 DIAGNOSIS — M40209 Unspecified kyphosis, site unspecified: Secondary | ICD-10-CM | POA: Diagnosis present

## 2015-10-08 DIAGNOSIS — Z7401 Bed confinement status: Secondary | ICD-10-CM

## 2015-10-08 DIAGNOSIS — J45909 Unspecified asthma, uncomplicated: Secondary | ICD-10-CM | POA: Diagnosis present

## 2015-10-08 DIAGNOSIS — E119 Type 2 diabetes mellitus without complications: Secondary | ICD-10-CM | POA: Diagnosis present

## 2015-10-08 DIAGNOSIS — Z79810 Long term (current) use of selective estrogen receptor modulators (SERMs): Secondary | ICD-10-CM

## 2015-10-08 DIAGNOSIS — Z9981 Dependence on supplemental oxygen: Secondary | ICD-10-CM

## 2015-10-08 NOTE — ED Notes (Signed)
Family states patient had fever of 101.8 at home tonight. States she is also complaining of right earache. States she gave tylenol at 2045.

## 2015-10-09 ENCOUNTER — Emergency Department (HOSPITAL_COMMUNITY): Payer: Medicare Other

## 2015-10-09 DIAGNOSIS — Z9010 Acquired absence of unspecified breast and nipple: Secondary | ICD-10-CM | POA: Diagnosis not present

## 2015-10-09 DIAGNOSIS — C50912 Malignant neoplasm of unspecified site of left female breast: Secondary | ICD-10-CM | POA: Diagnosis present

## 2015-10-09 DIAGNOSIS — N12 Tubulo-interstitial nephritis, not specified as acute or chronic: Secondary | ICD-10-CM | POA: Diagnosis present

## 2015-10-09 DIAGNOSIS — N390 Urinary tract infection, site not specified: Secondary | ICD-10-CM

## 2015-10-09 DIAGNOSIS — Z7951 Long term (current) use of inhaled steroids: Secondary | ICD-10-CM | POA: Diagnosis not present

## 2015-10-09 DIAGNOSIS — R509 Fever, unspecified: Secondary | ICD-10-CM | POA: Diagnosis present

## 2015-10-09 DIAGNOSIS — Z7952 Long term (current) use of systemic steroids: Secondary | ICD-10-CM | POA: Diagnosis not present

## 2015-10-09 DIAGNOSIS — K219 Gastro-esophageal reflux disease without esophagitis: Secondary | ICD-10-CM | POA: Diagnosis present

## 2015-10-09 DIAGNOSIS — Z79899 Other long term (current) drug therapy: Secondary | ICD-10-CM | POA: Diagnosis not present

## 2015-10-09 DIAGNOSIS — J189 Pneumonia, unspecified organism: Secondary | ICD-10-CM | POA: Diagnosis present

## 2015-10-09 DIAGNOSIS — Z7401 Bed confinement status: Secondary | ICD-10-CM | POA: Diagnosis not present

## 2015-10-09 DIAGNOSIS — J45909 Unspecified asthma, uncomplicated: Secondary | ICD-10-CM | POA: Diagnosis present

## 2015-10-09 DIAGNOSIS — I10 Essential (primary) hypertension: Secondary | ICD-10-CM

## 2015-10-09 DIAGNOSIS — Z9981 Dependence on supplemental oxygen: Secondary | ICD-10-CM | POA: Diagnosis not present

## 2015-10-09 DIAGNOSIS — L8990 Pressure ulcer of unspecified site, unspecified stage: Secondary | ICD-10-CM | POA: Insufficient documentation

## 2015-10-09 DIAGNOSIS — Z825 Family history of asthma and other chronic lower respiratory diseases: Secondary | ICD-10-CM | POA: Diagnosis not present

## 2015-10-09 DIAGNOSIS — M40209 Unspecified kyphosis, site unspecified: Secondary | ICD-10-CM | POA: Diagnosis present

## 2015-10-09 DIAGNOSIS — Z7982 Long term (current) use of aspirin: Secondary | ICD-10-CM | POA: Diagnosis not present

## 2015-10-09 DIAGNOSIS — I471 Supraventricular tachycardia: Secondary | ICD-10-CM | POA: Diagnosis not present

## 2015-10-09 DIAGNOSIS — M1990 Unspecified osteoarthritis, unspecified site: Secondary | ICD-10-CM | POA: Diagnosis present

## 2015-10-09 DIAGNOSIS — M069 Rheumatoid arthritis, unspecified: Secondary | ICD-10-CM | POA: Diagnosis present

## 2015-10-09 DIAGNOSIS — E119 Type 2 diabetes mellitus without complications: Secondary | ICD-10-CM | POA: Diagnosis present

## 2015-10-09 DIAGNOSIS — B9689 Other specified bacterial agents as the cause of diseases classified elsewhere: Secondary | ICD-10-CM | POA: Diagnosis present

## 2015-10-09 DIAGNOSIS — Z79810 Long term (current) use of selective estrogen receptor modulators (SERMs): Secondary | ICD-10-CM | POA: Diagnosis not present

## 2015-10-09 DIAGNOSIS — Z7984 Long term (current) use of oral hypoglycemic drugs: Secondary | ICD-10-CM | POA: Diagnosis not present

## 2015-10-09 LAB — INFLUENZA PANEL BY PCR (TYPE A & B)
H1N1FLUPCR: NOT DETECTED
Influenza A By PCR: NEGATIVE
Influenza B By PCR: NEGATIVE

## 2015-10-09 LAB — URINALYSIS, ROUTINE W REFLEX MICROSCOPIC
Bilirubin Urine: NEGATIVE
GLUCOSE, UA: 250 mg/dL — AB
Hgb urine dipstick: NEGATIVE
NITRITE: NEGATIVE
Specific Gravity, Urine: 1.015 (ref 1.005–1.030)
pH: 5.5 (ref 5.0–8.0)

## 2015-10-09 LAB — CBC WITH DIFFERENTIAL/PLATELET
BASOS ABS: 0 10*3/uL (ref 0.0–0.1)
Basophils Relative: 0 %
EOS ABS: 0 10*3/uL (ref 0.0–0.7)
EOS PCT: 0 %
HCT: 35.6 % — ABNORMAL LOW (ref 36.0–46.0)
HEMOGLOBIN: 11.1 g/dL — AB (ref 12.0–15.0)
LYMPHS ABS: 1.5 10*3/uL (ref 0.7–4.0)
LYMPHS PCT: 7 %
MCH: 28.5 pg (ref 26.0–34.0)
MCHC: 31.2 g/dL (ref 30.0–36.0)
MCV: 91.5 fL (ref 78.0–100.0)
Monocytes Absolute: 0.7 10*3/uL (ref 0.1–1.0)
Monocytes Relative: 3 %
NEUTROS PCT: 90 %
Neutro Abs: 18.7 10*3/uL — ABNORMAL HIGH (ref 1.7–7.7)
PLATELETS: 202 10*3/uL (ref 150–400)
RBC: 3.89 MIL/uL (ref 3.87–5.11)
RDW: 13.4 % (ref 11.5–15.5)
WBC: 20.9 10*3/uL — AB (ref 4.0–10.5)

## 2015-10-09 LAB — COMPREHENSIVE METABOLIC PANEL
ALBUMIN: 2.8 g/dL — AB (ref 3.5–5.0)
ALT: 19 U/L (ref 14–54)
AST: 21 U/L (ref 15–41)
Alkaline Phosphatase: 61 U/L (ref 38–126)
Anion gap: 7 (ref 5–15)
BUN: 25 mg/dL — AB (ref 6–20)
CHLORIDE: 101 mmol/L (ref 101–111)
CO2: 28 mmol/L (ref 22–32)
Calcium: 9.1 mg/dL (ref 8.9–10.3)
Creatinine, Ser: 0.91 mg/dL (ref 0.44–1.00)
GFR calc Af Amer: >60 mL/min (ref >60)
GFR, EST NON AFRICAN AMERICAN: 55 mL/min — AB (ref >60)
GLUCOSE: 342 mg/dL — AB (ref 65–99)
POTASSIUM: 4.1 mmol/L (ref 3.5–5.1)
SODIUM: 136 mmol/L (ref 135–145)
Total Bilirubin: 0.4 mg/dL (ref 0.3–1.2)
Total Protein: 5.9 g/dL — ABNORMAL LOW (ref 6.5–8.1)

## 2015-10-09 LAB — TROPONIN I

## 2015-10-09 LAB — URINE MICROSCOPIC-ADD ON

## 2015-10-09 LAB — I-STAT CG4 LACTIC ACID, ED: LACTIC ACID, VENOUS: 1.91 mmol/L (ref 0.5–2.0)

## 2015-10-09 MED ORDER — SODIUM CHLORIDE 0.9 % IV SOLN: Freq: Once | INTRAVENOUS | Status: DC

## 2015-10-09 MED ORDER — SODIUM CHLORIDE 0.9 % IV SOLN
INTRAVENOUS | Status: AC
  Administered 2015-10-09: 07:00:00 via INTRAVENOUS

## 2015-10-09 MED ORDER — DEXTROSE 5 % IV SOLN
1.0000 g | INTRAVENOUS | Status: DC
  Administered 2015-10-10 – 2015-10-11 (×2): 1 g via INTRAVENOUS
  Filled 2015-10-09 (×3): qty 10

## 2015-10-09 MED ORDER — ENOXAPARIN SODIUM 40 MG/0.4ML ~~LOC~~ SOLN
40.0000 mg | SUBCUTANEOUS | Status: DC
  Administered 2015-10-09 – 2015-10-12 (×4): 40 mg via SUBCUTANEOUS
  Filled 2015-10-09 (×4): qty 0.4

## 2015-10-09 MED ORDER — DEXTROSE 5 % IV SOLN
1.0000 g | Freq: Once | INTRAVENOUS | Status: AC
  Administered 2015-10-09: 1 g via INTRAVENOUS
  Filled 2015-10-09: qty 10

## 2015-10-09 MED ORDER — ACETAMINOPHEN 325 MG PO TABS
650.0000 mg | ORAL_TABLET | Freq: Four times a day (QID) | ORAL | Status: DC | PRN
  Administered 2015-10-09 – 2015-10-10 (×2): 650 mg via ORAL
  Filled 2015-10-09 (×2): qty 2

## 2015-10-09 MED ORDER — DEXTROSE 5 % IV SOLN
500.0000 mg | INTRAVENOUS | Status: DC
  Administered 2015-10-09: 500 mg via INTRAVENOUS
  Filled 2015-10-09: qty 500

## 2015-10-09 MED ORDER — AZITHROMYCIN 500 MG IV SOLR
500.0000 mg | INTRAVENOUS | Status: DC
  Administered 2015-10-10 – 2015-10-11 (×2): 500 mg via INTRAVENOUS
  Filled 2015-10-09 (×3): qty 500

## 2015-10-09 MED ORDER — SODIUM CHLORIDE 0.9 % IV BOLUS (SEPSIS)
500.0000 mL | Freq: Once | INTRAVENOUS | Status: AC
  Administered 2015-10-09: 500 mL via INTRAVENOUS

## 2015-10-09 MED ORDER — SODIUM CHLORIDE 0.9 % IV BOLUS (SEPSIS)
1000.0000 mL | Freq: Once | INTRAVENOUS | Status: AC
  Administered 2015-10-09: 1000 mL via INTRAVENOUS

## 2015-10-09 NOTE — H&P (Signed)
PCP:   Moshe Cipro, MD   Chief Complaint:  Sob, fever, weak  HPI: 82 yo female bedbound taken care of at home by her daughters came to ED 2 days ago diagnosed with URI.  Went home got worse more weak, and today spiked a temp over 101.  Her sob was worse.  No n/v/d.  No rashes.  No sick contacts.  Came back to ED, and her cxr now shows left infiltrate, she had a neg cxr 2 days ago.  Pt referred for admission for pna.   she has received over a liter of ivf and she says she is already feeling much better.   Review of Systems:  Positive and negative as per HPI otherwise all other systems are negative  Past Medical History: Past Medical History  Diagnosis Date  . Asthma   . Hypertension   . Diabetes mellitus without complication ([place])   . Acid reflux   . Kyphosis   . Gastric ulcer   . Raynaud disease   . Anemia   . Arthritis     ra  . Cancer [facility])     breast cancer   Past Surgical History  Procedure Laterality Date  . Hernia repair    . Breast surgery    . Mastectomy    . Simple mastectomy with axillary sentinel node biopsy Left 05/09/2015    Procedure: LEFT SIMPLE MASTECTOMY;  Surgeon: Erroll Luna, MD;  Location: [facility];  Service: General;  Laterality: Left;    Medications: Prior to Admission medications   Medication Sig Start Date End Date Taking? Authorizing Provider  albuterol (PROVENTIL HFA) 108 (90 BASE) MCG/ACT inhaler Inhale 2 puffs into the lungs every 6 (six) hours as needed for shortness of breath.     Historical Provider, MD  albuterol (PROVENTIL) (2.5 MG/3ML) 0.083% nebulizer solution Take 2.5 mg by nebulization daily. With Ipratropium as needed for shortness of breath    Historical Provider, MD  aspirin EC 81 MG tablet Take 81 mg by mouth daily.     Historical Provider, MD  atorvastatin (LIPITOR) 20 MG tablet Take 20 mg by mouth at bedtime.    Historical Provider, MD  budesonide-formoterol (SYMBICORT) 160-4.5 MCG/ACT inhaler Inhale 2 puffs into the lungs 2  (two) times daily.    Historical Provider, MD  Chlorpheniramine-Acetaminophen (CORICIDIN HBP COLD/FLU PO) Take 1 capsule by mouth every 6 (six) hours as needed.    Historical Provider, MD  Cholecalciferol (VITAMIN D3) 1000 UNITS CAPS Take 1 capsule by mouth daily.    Historical Provider, MD  ciprofloxacin (CIPRO) 250 MG tablet Take 1 tablet (250 mg total) by mouth 2 (two) times daily. Patient not taking: Reported on 09/26/2015 07/01/15   Erline Hau, MD  ferrous sulfate 325 (65 FE) MG tablet Take 325 mg by mouth daily. 07/23/15   Historical Provider, MD  fish oil-omega-3 fatty acids 1000 MG capsule Take 1 g by mouth every morning.    Historical Provider, MD  ipratropium (ATROVENT) 0.02 % nebulizer solution Take 500 mcg by nebulization daily. With albuterol as needed for shortness of breath    Historical Provider, MD  lactobacillus acidophilus & bulgar (LACTINEX) chewable tablet CHEW 1 TABLET BY MOUTH EVERY DAY 09/17/15   Historical Provider, MD  linagliptin (TRADJENTA) 5 MG TABS tablet Take 5 mg by mouth every morning.    Historical Provider, MD  metFORMIN (GLUCOPHAGE) 500 MG tablet Take 500-1,000 mg by mouth 2 (two) times daily. 1 in the morning and 2 at  bedtime    Historical Provider, MD  Multiple Vitamins-Minerals (CENTRUM SILVER ADULT 50+ PO) Take 1 tablet by mouth every morning.     Historical Provider, MD  ondansetron (ZOFRAN ODT) 4 MG disintegrating tablet 4mg  ODT q4 hours prn nausea/vomit Patient not taking: Reported on 09/26/2015 10/03/14   Milton Ferguson, MD  pantoprazole (PROTONIX) 40 MG tablet Take 40 mg by mouth daily.    Historical Provider, MD  Polyvinyl Alcohol-Povidone (CLEAR EYES ALL SEASONS) 5-6 MG/ML SOLN Apply 1 drop to eye every morning.     Historical Provider, MD  predniSONE (DELTASONE) 5 MG tablet Take 5 mg by mouth daily with breakfast.    Historical Provider, MD  raloxifene (EVISTA) 60 MG tablet Take 60 mg by mouth every morning.    Historical Provider, MD   tamoxifen (NOLVADEX) 20 MG tablet TAKE 1 TABLET EVERY DAY 09/24/15   Patrici Ranks, MD  traMADol (ULTRAM) 50 MG tablet Take 1 tablet (50 mg total) by mouth every 6 (six) hours as needed (pain). 05/10/15   Rolm Bookbinder, MD    Allergies:   Allergies  Allergen Reactions  . Aspirin Other (See Comments)    Cannot take uncoated due to stomach ulcers    Social History:  reports that she has never smoked. She has never used smokeless tobacco. She reports that she does not drink alcohol or use illicit drugs.  Family History: Family History  Problem Relation Age of Onset  . Asthma Sister   . Rheum arthritis Daughter     Physical Exam: Filed Vitals:   10/09/15 0400 10/09/15 0415 10/09/15 0430 10/09/15 0445  BP: 115/57     Pulse: 123 120 126 121  Temp:      TempSrc:      Resp: 24 19 21 22   Height:      Weight:      SpO2: 95% 95% 95% 95%   General appearance: alert, cooperative and no distress kyphotic Head: Normocephalic, without obvious abnormality, atraumatic Eyes: negative Nose: Nares normal. Septum midline. Mucosa normal. No drainage or sinus tenderness. Neck: no JVD and supple, symmetrical, trachea midline Lungs: rhonchi LLL Heart: regular rate and rhythm, S1, S2 normal, no murmur, click, rub or gallop Abdomen: soft, non-tender; bowel sounds normal; no masses,  no organomegaly Extremities: extremities normal, atraumatic, no cyanosis or edema  Many changes c/w severe rheum arthritis Pulses: 2+ and symmetric Skin: Skin color, texture, turgor normal. No rashes or lesions Neurologic: Grossly normal   Labs on Admission:   Recent Labs  10/09/15 0145  NA 136  K 4.1  CL 101  CO2 28  GLUCOSE 342*  BUN 25*  CREATININE 0.91  CALCIUM 9.1    Recent Labs  10/09/15 0145  AST 21  ALT 19  ALKPHOS 61  BILITOT 0.4  PROT 5.9*  ALBUMIN 2.8*    Recent Labs  10/09/15 0145  WBC 20.9*  NEUTROABS 18.7*  HGB 11.1*  HCT 35.6*  MCV 91.5  PLT 202    Recent  Labs  10/09/15 0145  TROPONINI <0.03   Radiological Exams on Admission: Dg Chest 2 View  10/03/2015  CLINICAL DATA:  Cold symptoms for by 1 week. Cough, congestion, worsening the past couple days. EXAM: CHEST  2 VIEW COMPARISON:  06/30/2015 FINDINGS: Mild elevation of the left hemidiaphragm. Gaseous distention of the stomach. Lungs are clear. Heart is normal size. No effusions or acute bony abnormality. IMPRESSION: No active cardiopulmonary disease. Electronically Signed   By: Rolm Baptise M.D.  On: 10/03/2015 15:27   Dg Chest Port 1 View  10/09/2015  CLINICAL DATA:  Productive cough and congestion for 1 week. Increased weakness. Fever tonight. EXAM: PORTABLE CHEST 1 VIEW COMPARISON:  10/03/2015 FINDINGS: Patient's chin obscures the left lung apex. Development of patchy opacity at the left lung base and left midlung zone. Minimal right basilar atelectasis. Cardiomediastinal contours are unchanged. No evidence pneumothorax or large pleural effusion. Surgical clips in the left axilla. IMPRESSION: Development of patchy left lung opacity concerning for pneumonia. Followup PA and lateral chest X-ray is recommended in 3-4 weeks following trial of antibiotic therapy to ensure resolution and exclude underlying malignancy. Electronically Signed   By: Jeb Levering M.D.   On: 10/09/2015 02:14   Old chart reviewed Case discussed with dr Jeneen Rinks edp cxr reviewed left infiltrate noted ekg reviewed sinus tachycardia  Assessment/Plan  82 yo female with CAP and possible uti  Principal Problem:   CAP (community acquired pneumonia)- pna pathway.  Rocephin and azithromycin.  Will also check quickflu.  Respiratory status stable.  Blood, urine and sputum cx ordered.  Active Problems:   Rheumatoid arthritis ([place])- noted, severe   HTN (hypertension)- noted, stable   Diabetes type 2, controlled ([place])- ssi   UTI (urinary tract infection)- rocephin,  Urine cx pending   Kyphosis  Admit to tele bed.  Full code  per patient and family wishes.    Mitchel Delduca A 10/09/2015, 5:05 AM

## 2015-10-09 NOTE — Progress Notes (Signed)
Inpatient Diabetes Program Recommendations  AACE/ADA: New Consensus Statement on Inpatient Glycemic Control (2015)  Target Ranges:  Prepandial:   less than 140 mg/dL      Peak postprandial:   less than 180 mg/dL (1-2 hours)      Critically ill patients:  140 - 180 mg/dL   Review of Glycemic Control:   Results for [name], [name] (MRN [id number]) as of 10/09/2015 10:49  Ref. Range 10/09/2015 01:45  Glucose Latest Ref Range: 65-99 mg/dL 342 (H)   Diabetes history: Type 2 diabetes Outpatient Diabetes medications:  Tradjenta 5 mg q AM, Metformin 500 mg in AM and 1000 mg in PM Current orders for Inpatient glycemic control:  None  Inpatient Diabetes Program Recommendations:    MD please add Novolog sensitive tid with meals and HS.  Note history of diabetes.  Thanks, Adah Perl, RN, BC-ADM Inpatient Diabetes Coordinator Pager [phone] (8a-5p)

## 2015-10-09 NOTE — ED Provider Notes (Addendum)
CSN: [id number]     Arrival date & time 10/08/15  2246 History   First MD Initiated Contact with Patient 10/09/15 0107     Chief Complaint  Patient presents with  . Fever  . Otalgia     HPI  Patient presents for evaluation of fever, cough, and profound weakness.  Patient is debilitated from kyphosis, and rheumatoid arthritis.  Lives at home with the daughter, and son-in-law.  Has had upper respiratory infection symptoms with runny nose and cough for several days. Seen and evaluated here December 28 with a normal x-ray and normal vitals.  For the last 24 hours she is been profoundly weak at home. Normally only transfers from bed to lift chair with help of family. But has been markedly fatigued.  Has complained of more of a cough and shortness of breath. Is is "sore" under her ribs with coughing area did sleep with oxygen at night but weren't during the day today. Temperature 101.5 at home and family presents her here.  Past Medical History  Diagnosis Date  . Asthma   . Hypertension   . Diabetes mellitus without complication ([place])   . Acid reflux   . Kyphosis   . Gastric ulcer   . Raynaud disease   . Anemia   . Arthritis     ra  . Cancer [facility])     breast cancer   Past Surgical History  Procedure Laterality Date  . Hernia repair    . Breast surgery    . Mastectomy    . Simple mastectomy with axillary sentinel node biopsy Left 05/09/2015    Procedure: LEFT SIMPLE MASTECTOMY;  Surgeon: Erroll Luna, MD;  Location: [place] OR;  Service: General;  Laterality: Left;   Family History  Problem Relation Age of Onset  . Asthma Sister   . Rheum arthritis Daughter    Social History  Substance Use Topics  . Smoking status: Never Smoker   . Smokeless tobacco: Never Used  . Alcohol Use: No   OB History    No data available     Review of Systems  Constitutional: Positive for fever, chills and fatigue. Negative for diaphoresis and appetite change.  HENT: Negative for mouth sores,  sore throat and trouble swallowing.   Eyes: Negative for visual disturbance.  Respiratory: Positive for cough and shortness of breath. Negative for chest tightness and wheezing.   Cardiovascular: Negative for chest pain.  Gastrointestinal: Negative for nausea, vomiting, abdominal pain, diarrhea and abdominal distention.  Endocrine: Negative for polydipsia, polyphagia and polyuria.  Genitourinary: Negative for dysuria, frequency and hematuria.  Musculoskeletal: Negative for gait problem.  Skin: Negative for color change, pallor and rash.  Neurological: Positive for weakness. Negative for dizziness, syncope, light-headedness and headaches.  Hematological: Does not bruise/bleed easily.  Psychiatric/Behavioral: Negative for behavioral problems and confusion.      Allergies  Aspirin  Home Medications   Prior to Admission medications   Medication Sig Start Date End Date Taking? Authorizing Provider  albuterol (PROVENTIL HFA) 108 (90 BASE) MCG/ACT inhaler Inhale 2 puffs into the lungs every 6 (six) hours as needed for shortness of breath.     Historical Provider, MD  albuterol (PROVENTIL) (2.5 MG/3ML) 0.083% nebulizer solution Take 2.5 mg by nebulization daily. With Ipratropium as needed for shortness of breath    Historical Provider, MD  aspirin EC 81 MG tablet Take 81 mg by mouth daily.     Historical Provider, MD  atorvastatin (LIPITOR) 20 MG tablet Take 20  mg by mouth at bedtime.    Historical Provider, MD  budesonide-formoterol (SYMBICORT) 160-4.5 MCG/ACT inhaler Inhale 2 puffs into the lungs 2 (two) times daily.    Historical Provider, MD  Chlorpheniramine-Acetaminophen (CORICIDIN HBP COLD/FLU PO) Take 1 capsule by mouth every 6 (six) hours as needed.    Historical Provider, MD  Cholecalciferol (VITAMIN D3) 1000 UNITS CAPS Take 1 capsule by mouth daily.    Historical Provider, MD  ciprofloxacin (CIPRO) 250 MG tablet Take 1 tablet (250 mg total) by mouth 2 (two) times daily. Patient not  taking: Reported on 09/26/2015 07/01/15   Erline Hau, MD  ferrous sulfate 325 (65 FE) MG tablet Take 325 mg by mouth daily. 07/23/15   Historical Provider, MD  fish oil-omega-3 fatty acids 1000 MG capsule Take 1 g by mouth every morning.    Historical Provider, MD  ipratropium (ATROVENT) 0.02 % nebulizer solution Take 500 mcg by nebulization daily. With albuterol as needed for shortness of breath    Historical Provider, MD  lactobacillus acidophilus & bulgar (LACTINEX) chewable tablet CHEW 1 TABLET BY MOUTH EVERY DAY 09/17/15   Historical Provider, MD  linagliptin (TRADJENTA) 5 MG TABS tablet Take 5 mg by mouth every morning.    Historical Provider, MD  metFORMIN (GLUCOPHAGE) 500 MG tablet Take 500-1,000 mg by mouth 2 (two) times daily. 1 in the morning and 2 at bedtime    Historical Provider, MD  Multiple Vitamins-Minerals (CENTRUM SILVER ADULT 50+ PO) Take 1 tablet by mouth every morning.     Historical Provider, MD  ondansetron (ZOFRAN ODT) 4 MG disintegrating tablet 4mg  ODT q4 hours prn nausea/vomit Patient not taking: Reported on 09/26/2015 10/03/14   Milton Ferguson, MD  pantoprazole (PROTONIX) 40 MG tablet Take 40 mg by mouth daily.    Historical Provider, MD  Polyvinyl Alcohol-Povidone (CLEAR EYES ALL SEASONS) 5-6 MG/ML SOLN Apply 1 drop to eye every morning.     Historical Provider, MD  predniSONE (DELTASONE) 5 MG tablet Take 5 mg by mouth daily with breakfast.    Historical Provider, MD  raloxifene (EVISTA) 60 MG tablet Take 60 mg by mouth every morning.    Historical Provider, MD  tamoxifen (NOLVADEX) 20 MG tablet TAKE 1 TABLET EVERY DAY 09/24/15   Patrici Ranks, MD  traMADol (ULTRAM) 50 MG tablet Take 1 tablet (50 mg total) by mouth every 6 (six) hours as needed (pain). 05/10/15   Rolm Bookbinder, MD   BP 115/57 mmHg  Pulse 123  Temp(Src) 99.6 F (37.6 C) (Rectal)  Resp 24  Ht 5\' 3"  (1.6 m)  Wt 106 lb (48.081 kg)  BMI 18.78 kg/m2  SpO2 95% Physical Exam   Constitutional: She is oriented to person, place, and time. No distress.  Thin frail appearing elderly female.  HENT:  Head: Normocephalic.  Eyes: Conjunctivae are normal. Pupils are equal, round, and reactive to light. No scleral icterus.  Neck: Normal range of motion. Neck supple. No thyromegaly present.  Cardiovascular: Normal rate and regular rhythm.  Exam reveals no gallop and no friction rub.   No murmur heard. Pulmonary/Chest: Effort normal and breath sounds normal. No respiratory distress. She has no wheezes. She has no rales.  Bilateral mastectomies. Subtle intercostal retractions. Slight increased work of breathing. No wheezing or prolongation. Left basilar diminished breath sounds with crackles and rales  Abdominal: Soft. Bowel sounds are normal. She exhibits no distension. There is no tenderness. There is no rebound.  Musculoskeletal: Normal range of motion.  Marked  kyphoscoliosis.  Neurological: She is alert and oriented to person, place, and time.  Skin: Skin is warm and dry. No rash noted.  No peripheral edema.  Psychiatric: She has a normal mood and affect. Her behavior is normal.    ED Course  Procedures (including critical care time) Labs Review Labs Reviewed  CBC WITH DIFFERENTIAL/PLATELET - Abnormal; Notable for the following:    WBC 20.9 (*)    Hemoglobin 11.1 (*)    HCT 35.6 (*)    Neutro Abs 18.7 (*)    All other components within normal limits  URINALYSIS, ROUTINE W REFLEX MICROSCOPIC (NOT AT [facility]) - Abnormal; Notable for the following:    APPearance CLOUDY (*)    Glucose, UA 250 (*)    Ketones, ur TRACE (*)    Protein, ur TRACE (*)    Leukocytes, UA MODERATE (*)    All other components within normal limits  COMPREHENSIVE METABOLIC PANEL - Abnormal; Notable for the following:    Glucose, Bld 342 (*)    BUN 25 (*)    Total Protein 5.9 (*)    Albumin 2.8 (*)    GFR calc non Af Amer 55 (*)    All other components within normal limits  URINE  MICROSCOPIC-ADD ON - Abnormal; Notable for the following:    Squamous Epithelial / LPF 6-30 (*)    Bacteria, UA MANY (*)    All other components within normal limits  CULTURE, BLOOD (ROUTINE X 2)  CULTURE, BLOOD (ROUTINE X 2)  URINE CULTURE  TROPONIN I  I-STAT CG4 LACTIC ACID, ED    Imaging Review Dg Chest Port 1 View  10/09/2015  CLINICAL DATA:  Productive cough and congestion for 1 week. Increased weakness. Fever tonight. EXAM: PORTABLE CHEST 1 VIEW COMPARISON:  10/03/2015 FINDINGS: Patient's chin obscures the left lung apex. Development of patchy opacity at the left lung base and left midlung zone. Minimal right basilar atelectasis. Cardiomediastinal contours are unchanged. No evidence pneumothorax or large pleural effusion. Surgical clips in the left axilla. IMPRESSION: Development of patchy left lung opacity concerning for pneumonia. Followup PA and lateral chest X-ray is recommended in 3-4 weeks following trial of antibiotic therapy to ensure resolution and exclude underlying malignancy. Electronically Signed   By: Jeb Levering M.D.   On: 10/09/2015 02:14   I have personally reviewed and evaluated these images and lab results as part of my medical decision-making.   EKG Interpretation None      MDM   Final diagnoses:  Community acquired pneumonia  UTI (lower urinary tract infection)    Patient afebrile here. Has persistent tachycardia. Sinus tachycardia on the monitor and by EKG. Has one low blood pressure 97/76. "Not unusual" per family. He states she was taken off her blood pressure medicine in September because of occasional low readings. On room air she is as low as 88 and his high as 92% saturation. He is to [facility] well. Has a normal lactate. Given IV fluids 1 L. Given Rocephin and Zithromax. Urine and blood cultures obtained. I will speak with the hospitalist regarding disposition.    Tanna Furry, MD 10/09/15 0425  Tanna Furry, MD 10/09/15 [phone]

## 2015-10-09 NOTE — Progress Notes (Signed)
Patient seen and examined. Admitted earlier today with fever and cough found to have an infiltrate on chest x-ray. She is being treated for community-acquired pneumonia with Rocephin and azithromycin. Culture data has been requested. We'll continue to follow. Discussed with 2 daughters [place] and [place] at bedside.  Domingo Mend, MD [facility] Pager: [phone]

## 2015-10-09 NOTE — Care Management Note (Signed)
Case Management Note  Patient Details  Name: [name] MRN: [id number] Date of Birth: [birth year]  Subjective/Objective:                  Pt admitted from home with pneumonia. Pt lives with her daughter and will return home at discharge. Pt has a cane, walker, w/c, and BSC for home use. Pt stated that her family assist her with ADL's and ambulating.  Action/Plan: No CM needs noted.  Expected Discharge Date:                  Expected Discharge Plan:  Home/Self Care  In-House Referral:  NA  Discharge planning Services  CM Consult  Post Acute Care Choice:  NA Choice offered to:  NA  DME Arranged:    DME Agency:     HH Arranged:    HH Agency:     Status of Service:  Completed, signed off  Medicare Important Message Given:    Date Medicare IM Given:    Medicare IM give by:    Date Additional Medicare IM Given:    Additional Medicare Important Message give by:     If discussed at [place] of Stay Meetings, dates discussed:    Additional Comments:  Joylene Draft, RN 10/09/2015, 11:26 AM

## 2015-10-10 DIAGNOSIS — J189 Pneumonia, unspecified organism: Principal | ICD-10-CM

## 2015-10-10 LAB — CBC
HCT: 32.2 % — ABNORMAL LOW (ref 36.0–46.0)
HEMOGLOBIN: 10.2 g/dL — AB (ref 12.0–15.0)
MCH: 29.1 pg (ref 26.0–34.0)
MCHC: 31.7 g/dL (ref 30.0–36.0)
MCV: 91.7 fL (ref 78.0–100.0)
PLATELETS: 209 10*3/uL (ref 150–400)
RBC: 3.51 MIL/uL — AB (ref 3.87–5.11)
RDW: 13.7 % (ref 11.5–15.5)
WBC: 18.3 10*3/uL — AB (ref 4.0–10.5)

## 2015-10-10 LAB — URINE CULTURE

## 2015-10-10 LAB — BASIC METABOLIC PANEL
ANION GAP: 5 (ref 5–15)
BUN: 12 mg/dL (ref 6–20)
CALCIUM: 8 mg/dL — AB (ref 8.9–10.3)
CO2: 27 mmol/L (ref 22–32)
CREATININE: 0.67 mg/dL (ref 0.44–1.00)
Chloride: 106 mmol/L (ref 101–111)
Glucose, Bld: 234 mg/dL — ABNORMAL HIGH (ref 65–99)
Potassium: 3.8 mmol/L (ref 3.5–5.1)
SODIUM: 138 mmol/L (ref 135–145)

## 2015-10-10 MED ORDER — METFORMIN HCL 500 MG PO TABS
1000.0000 mg | ORAL_TABLET | Freq: Every day | ORAL | Status: DC
  Administered 2015-10-10 – 2015-10-11 (×2): 1000 mg via ORAL
  Filled 2015-10-10 (×2): qty 2

## 2015-10-10 MED ORDER — IPRATROPIUM BROMIDE 0.02 % IN SOLN: 500.0000 ug | Freq: Every day | RESPIRATORY_TRACT | Status: DC

## 2015-10-10 MED ORDER — PANTOPRAZOLE SODIUM 40 MG PO TBEC
40.0000 mg | DELAYED_RELEASE_TABLET | Freq: Every day | ORAL | Status: DC
  Administered 2015-10-10 – 2015-10-12 (×3): 40 mg via ORAL
  Filled 2015-10-10 (×3): qty 1

## 2015-10-10 MED ORDER — LINAGLIPTIN 5 MG PO TABS
5.0000 mg | ORAL_TABLET | Freq: Every morning | ORAL | Status: DC
  Administered 2015-10-10 – 2015-10-12 (×3): 5 mg via ORAL
  Filled 2015-10-10 (×3): qty 1

## 2015-10-10 MED ORDER — METOPROLOL TARTRATE 1 MG/ML IV SOLN
5.0000 mg | Freq: Once | INTRAVENOUS | Status: AC
  Administered 2015-10-10 (×2): 5 mg via INTRAVENOUS

## 2015-10-10 MED ORDER — TAMOXIFEN CITRATE 10 MG PO TABS
20.0000 mg | ORAL_TABLET | Freq: Every day | ORAL | Status: DC
  Administered 2015-10-10 – 2015-10-12 (×3): 20 mg via ORAL
  Filled 2015-10-10 (×6): qty 2

## 2015-10-10 MED ORDER — SODIUM CHLORIDE 0.9 % IV BOLUS (SEPSIS)
500.0000 mL | Freq: Once | INTRAVENOUS | Status: AC
  Administered 2015-10-10: 500 mL via INTRAVENOUS

## 2015-10-10 MED ORDER — ASPIRIN EC 81 MG PO TBEC
81.0000 mg | DELAYED_RELEASE_TABLET | Freq: Every day | ORAL | Status: DC
  Administered 2015-10-10 – 2015-10-12 (×3): 81 mg via ORAL
  Filled 2015-10-10 (×3): qty 1

## 2015-10-10 MED ORDER — RALOXIFENE HCL 60 MG PO TABS: 60.0000 mg | ORAL_TABLET | Freq: Every morning | ORAL | Status: DC

## 2015-10-10 MED ORDER — IPRATROPIUM-ALBUTEROL 0.5-2.5 (3) MG/3ML IN SOLN
3.0000 mL | Freq: Four times a day (QID) | RESPIRATORY_TRACT | Status: DC
  Administered 2015-10-10 – 2015-10-12 (×9): 3 mL via RESPIRATORY_TRACT
  Filled 2015-10-10 (×9): qty 3

## 2015-10-10 MED ORDER — METFORMIN HCL 500 MG PO TABS
500.0000 mg | ORAL_TABLET | Freq: Every day | ORAL | Status: DC
  Administered 2015-10-11 – 2015-10-12 (×2): 500 mg via ORAL
  Filled 2015-10-10 (×2): qty 1

## 2015-10-10 MED ORDER — PREDNISONE 10 MG PO TABS
5.0000 mg | ORAL_TABLET | Freq: Every day | ORAL | Status: DC
  Administered 2015-10-11 – 2015-10-12 (×2): 5 mg via ORAL
  Filled 2015-10-10 (×2): qty 1

## 2015-10-10 MED ORDER — METOPROLOL TARTRATE 1 MG/ML IV SOLN
INTRAVENOUS | Status: AC
  Filled 2015-10-10: qty 5

## 2015-10-10 MED ORDER — ATORVASTATIN CALCIUM 20 MG PO TABS
20.0000 mg | ORAL_TABLET | Freq: Every day | ORAL | Status: DC
  Administered 2015-10-10 – 2015-10-11 (×2): 20 mg via ORAL
  Filled 2015-10-10 (×2): qty 1

## 2015-10-10 MED ORDER — ALBUTEROL SULFATE (2.5 MG/3ML) 0.083% IN NEBU: 2.5000 mg | INHALATION_SOLUTION | Freq: Every day | RESPIRATORY_TRACT | Status: DC

## 2015-10-10 MED ORDER — TRAMADOL HCL 50 MG PO TABS
50.0000 mg | ORAL_TABLET | Freq: Four times a day (QID) | ORAL | Status: DC | PRN
  Administered 2015-10-10 (×2): 50 mg via ORAL
  Filled 2015-10-10 (×3): qty 1

## 2015-10-10 MED ORDER — BUDESONIDE-FORMOTEROL FUMARATE 160-4.5 MCG/ACT IN AERO
2.0000 | INHALATION_SPRAY | Freq: Two times a day (BID) | RESPIRATORY_TRACT | Status: DC
  Administered 2015-10-10 – 2015-10-12 (×4): 2 via RESPIRATORY_TRACT
  Filled 2015-10-10: qty 6

## 2015-10-10 MED ORDER — METOPROLOL TARTRATE 1 MG/ML IV SOLN
INTRAVENOUS | Status: AC
  Administered 2015-10-10: 5 mg via INTRAVENOUS
  Filled 2015-10-10: qty 5

## 2015-10-10 MED ORDER — ALBUTEROL SULFATE (2.5 MG/3ML) 0.083% IN NEBU: 2.5000 mg | INHALATION_SOLUTION | RESPIRATORY_TRACT | Status: DC | PRN

## 2015-10-10 NOTE — Progress Notes (Signed)
[name] [id number] DOB: [birth year] DOA: 10/08/2015 PCP: Moshe Cipro, MD  Brief narrative: 65 ? HTN,  rheumatoid arthritiswith moderate kyphosis/chronic prednisone Breast cancer S/P mastectomy/tamoxifen diabetes mellitus type 2 HTN  recent emergency room visit 12/28 with cough  represented to [facility] emergency 1/3/17with 2 days of upper respiratory symptoms and fever >101 no contacts CXR = left infiltrate WBC on admission 20.9 hemoglobin 11.1 BUN/creatinine 25/0.9 over baseline 15/0.7 Lactic acid 1.9   Past medical history-As per Problem list Chart reviewed as below-   Consultants:  none  Procedures:    Antibiotics:  azithromycin  Ceftriaxone   Subjective   Fair Feels a little better No n/v abd pain nor dysuria breathign better States coughs after eats   Objective    Interim History:   Telemetry:    Objective: Filed Vitals:   10/09/15 2245 10/10/15 0300 10/10/15 0330 10/10/15 0517  BP: 134/64 111/55 113/49 121/58  Pulse:  165 92 93  Temp:  98.8 F (37.1 C)  99 F (37.2 C)  TempSrc:  Oral  Oral  Resp:  20 20 22   Height:      Weight:      SpO2:  97% 97% 100%    Intake/Output Summary (Last 24 hours) at 10/10/15 1011 Last data filed at 10/09/15 2100  Gross per 24 hour  Intake    720 ml  Output      5 ml  Net    715 ml    Exam:  General: eomi ncat Severe kyphotic Cardiovascular: s1 s 2no m/r/g Respiratory: clear no added sound Abdomen:  Soft nt nd no rebound Skin intact, MSK finddngs of Rh arthritis Neuro intact  Data Reviewed: Basic Metabolic Panel:  Recent Labs Lab 10/03/15 1656 10/09/15 0145 10/10/15 0656  NA 139 136 138  K 4.3 4.1 3.8  CL 98* 101 106  CO2 30 28 27   GLUCOSE 176* 342* 234*  BUN 15 25* 12  CREATININE 0.75 0.91 0.67  CALCIUM 9.3 9.1 8.0*   Liver Function Tests:  Recent Labs Lab 10/09/15 0145  AST 21  ALT 19  ALKPHOS 61  BILITOT 0.4  PROT 5.9*  ALBUMIN 2.8*   No results for  input(s): LIPASE, AMYLASE in the last 168 hours. No results for input(s): AMMONIA in the last 168 hours. CBC:  Recent Labs Lab 10/03/15 1656 10/09/15 0145 10/10/15 0656  WBC 12.6* 20.9* 18.3*  NEUTROABS 10.3* 18.7*  --   HGB 11.4* 11.1* 10.2*  HCT 36.5 35.6* 32.2*  MCV 92.2 91.5 91.7  PLT 180 202 209   Cardiac Enzymes:  Recent Labs Lab 10/09/15 0145  TROPONINI <0.03   BNP: Invalid input(s): POCBNP CBG: No results for input(s): GLUCAP in the last 168 hours.  Recent Results (from the past 240 hour(s))  Culture, blood (Routine X 2) w Reflex to ID Panel     Status: None (Preliminary result)   Collection Time: 10/09/15  3:15 AM  Result Value Ref Range Status   Specimen Description BLOOD RIGHT HAND  Final   Special Requests BOTTLES DRAWN AEROBIC ONLY 2CC  Final   Culture NO GROWTH 1 DAY  Final   Report Status PENDING  Incomplete  Culture, blood (Routine X 2) w Reflex to ID Panel     Status: None (Preliminary result)   Collection Time: 10/09/15  4:00 AM  Result Value Ref Range Status   Specimen Description BLOOD RIGHT FOREARM  Final   Special Requests BOTTLES DRAWN AEROBIC ONLY [place]  Final  Culture NO GROWTH 1 DAY  Final   Report Status PENDING  Incomplete     Studies:              All Imaging reviewed and is as per above notation   Scheduled Meds: . albuterol  2.5 mg Nebulization Daily  . aspirin EC  81 mg Oral Daily  . atorvastatin  20 mg Oral QHS  . azithromycin  500 mg Intravenous Q24H  . budesonide-formoterol  2 puff Inhalation BID  . cefTRIAXone (ROCEPHIN)  IV  1 g Intravenous Q24H  . enoxaparin (LOVENOX) injection  40 mg Subcutaneous Q24H  . ipratropium  500 mcg Nebulization Daily  . ipratropium-albuterol  3 mL Nebulization Q6H  . linagliptin  5 mg Oral q morning - 10a  . metFORMIN  500-1,000 mg Oral BID  . pantoprazole  40 mg Oral Daily  . [START ON 10/11/2015] predniSONE  5 mg Oral Q breakfast  . raloxifene  60 mg Oral q morning - 10a  . tamoxifen  20 mg  Oral Daily   Continuous Infusions:    Assessment/Plan:  1. CAP vs ASpiration-Continue broad spectrum Azithromycin and Rocephin.  SLP to screen in am.  CBC + Diff in am.  Immunocompromised 2/2 to both Cancer/Rh Arthritis on steroids 2. 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  Follow cs.  Expect to narrow to orals in 24 hrs 3. Rh Arthritis on Chr Prednisone-stable.  Continue home dose steroids prednisone 5 mg daily.  Not overtly septic and BP normal so no need stress dosing 4. DM ty II-continue Linagliptin 5 q am, Metformin per home regimen BID 5. Htn, history of-not currently on home meds for this.. Monitor 6. Breast cancer s/p Mastectomy-continue Tamoxifen 20 qd    Appt with PCP: none Code Status:  Full code presumed Family Communication:  family +-daughter at bedside Marlana Salvage is a retired RN] Disposition Plan: home with family DVT prophylaxis: SCD Consultants: NOne  Verneita Griffes, MD  [facility] Pager [phone] 10/10/2015, 10:11 AM    LOS: 1 day

## 2015-10-10 NOTE — Progress Notes (Signed)
Pt currently did not have any medication listed to give today on MAR. No home medication were listed in [facility]. Daughters at [facility] were concerned about how the patient had been here and not received any of the pt's home medications. Pt started to wheeze and become SOB. RN paged MD twice about Scheduledtreceived breathing treatment and states she feels better. Scheduled medications were given. VSS. RN will continue to monitor. Oswald Hillock, RN

## 2015-10-10 NOTE — Clinical Documentation Improvement (Signed)
Family Medicine Internal Medicine  Can the diagnosis of pressure ulcer(noted on Hosp prob list) be further specified with stage and site of pressure ulcer? Thank you     Document Site with laterality - Elbow, Back (upper/lower), Sacral, Hip, Buttock, Ankle, Heel, Head, Other (Specify)  Pressure Ulcer Stage - Stage1, Stage 2, Stage 3, Stage 4, Unstageable, Unspecified, Unable to Clinically Determine  Other  Clinically Undetermined    Supporting Information:  Per staff nursing skin assessment:  Stage II to sacrum  Please exercise your independent, professional judgment when responding. A specific answer is not anticipated or expected.   Thank You,  [place] [phone]

## 2015-10-11 LAB — BASIC METABOLIC PANEL
ANION GAP: 7 (ref 5–15)
BUN: 8 mg/dL (ref 6–20)
CALCIUM: 8.4 mg/dL — AB (ref 8.9–10.3)
CO2: 27 mmol/L (ref 22–32)
Chloride: 103 mmol/L (ref 101–111)
Creatinine, Ser: 0.65 mg/dL (ref 0.44–1.00)
GFR calc Af Amer: >60 mL/min (ref >60)
Glucose, Bld: 190 mg/dL — ABNORMAL HIGH (ref 65–99)
POTASSIUM: 4.1 mmol/L (ref 3.5–5.1)
SODIUM: 137 mmol/L (ref 135–145)

## 2015-10-11 LAB — CBC WITH DIFFERENTIAL/PLATELET
BASOS ABS: 0 10*3/uL (ref 0.0–0.1)
Basophils Relative: 0 %
Eosinophils Absolute: 0.1 10*3/uL (ref 0.0–0.7)
Eosinophils Relative: 1 %
HCT: 30.8 % — ABNORMAL LOW (ref 36.0–46.0)
Hemoglobin: 9.8 g/dL — ABNORMAL LOW (ref 12.0–15.0)
LYMPHS PCT: 11 %
Lymphs Abs: 1.4 10*3/uL (ref 0.7–4.0)
MCH: 29.1 pg (ref 26.0–34.0)
MCHC: 31.8 g/dL (ref 30.0–36.0)
MCV: 91.4 fL (ref 78.0–100.0)
Monocytes Absolute: 0.7 10*3/uL (ref 0.1–1.0)
Monocytes Relative: 5 %
NEUTROS ABS: 11 10*3/uL — AB (ref 1.7–7.7)
NEUTROS PCT: 83 %
PLATELETS: 186 10*3/uL (ref 150–400)
RBC: 3.37 MIL/uL — AB (ref 3.87–5.11)
RDW: 13.7 % (ref 11.5–15.5)
WBC: 13.3 10*3/uL — ABNORMAL HIGH (ref 4.0–10.5)

## 2015-10-11 LAB — TROPONIN I: Troponin I: <0.03 ng/mL (ref <0.031)

## 2015-10-11 LAB — POTASSIUM: Potassium: 4.7 mmol/L (ref 3.5–5.1)

## 2015-10-11 LAB — MAGNESIUM: MAGNESIUM: 1.6 mg/dL — AB (ref 1.7–2.4)

## 2015-10-11 MED ORDER — CEFUROXIME AXETIL 250 MG PO TABS
500.0000 mg | ORAL_TABLET | Freq: Two times a day (BID) | ORAL | Status: DC
  Administered 2015-10-11 – 2015-10-12 (×2): 500 mg via ORAL
  Filled 2015-10-11 (×2): qty 2

## 2015-10-11 MED ORDER — MAGNESIUM OXIDE 400 (241.3 MG) MG PO TABS
400.0000 mg | ORAL_TABLET | Freq: Two times a day (BID) | ORAL | Status: DC
  Administered 2015-10-11 – 2015-10-12 (×2): 400 mg via ORAL
  Filled 2015-10-11 (×3): qty 1

## 2015-10-11 MED ORDER — METOPROLOL TARTRATE 25 MG PO TABS
12.5000 mg | ORAL_TABLET | Freq: Two times a day (BID) | ORAL | Status: DC
  Administered 2015-10-11 – 2015-10-12 (×2): 12.5 mg via ORAL
  Filled 2015-10-11 (×3): qty 1

## 2015-10-11 NOTE — Progress Notes (Signed)
Patient heart rate up to 150's per telemetry. EKG, Mg, and K+ ordered as per Dr. Verlon Au

## 2015-10-11 NOTE — Progress Notes (Signed)
Telemetry called RN and said patient went into an episode of SVT with HR 160's. Pt is asymptomatic and VSS. MD notified and aware. RN will continue to monitor. Oswald Hillock, RN

## 2015-10-11 NOTE — Progress Notes (Signed)
[name] [id number] DOB: 10-26-1925 DOA: 10/08/2015 PCP: Moshe Cipro, MD  Brief narrative: 8 ? HTN,  rheumatoid arthritiswith moderate kyphosis/chronic prednisone Breast cancer S/P mastectomy/tamoxifen diabetes mellitus type 2 HTN  recent emergency room visit 12/28 with cough  represented to [facility] emergency 1/3/17with 2 days of upper respiratory symptoms and fever >101 no contacts CXR = left infiltrate WBC on admission 20.9 hemoglobin 11.1 BUN/creatinine 25/0.9 over baseline 15/0.7 Lactic acid 1.9   Past medical history-As per Problem list Chart reviewed as below-   Consultants:  none  Procedures:    Antibiotics:  azithromycin  Ceftriaxone   Subjective   Fair n fever No sputum Notified re SVT by RN-asymtpmatic-noted 16 beats and the 2 other bursts 8 beats SVT       Interim History:   Telemetry:    Objective: Filed Vitals:   10/11/15 0522 10/11/15 0852 10/11/15 1357 10/11/15 1515  BP: 90/47   107/49  Pulse: 98 101  107  Temp: 98.2 F (36.8 C)     TempSrc: Oral     Resp: 20 18    Height:      Weight:      SpO2: 100% 96% 94%     Intake/Output Summary (Last 24 hours) at 10/11/15 1654 Last data filed at 10/11/15 0800  Gross per 24 hour  Intake    600 ml  Output      2 ml  Net    598 ml    Exam:  General: eomi ncat Severe kyphotic Cardiovascular: s1 s 2no m/r/g Respiratory: clear no added sound Abdomen:  Soft nt nd no rebound Skin intact, MSK finddngs of Rh arthritis Neuro intact  Data Reviewed: Basic Metabolic Panel:  Recent Labs Lab 10/09/15 0145 10/10/15 0656 10/11/15 0819 10/11/15 1420  NA 136 138 137  --   K 4.1 3.8 4.1 4.7  CL 101 106 103  --   CO2 28 27 27   --   GLUCOSE 342* 234* 190*  --   BUN 25* 12 8  --   CREATININE 0.91 0.67 0.65  --   CALCIUM 9.1 8.0* 8.4*  --   MG  --   --   --  1.6*   Liver Function Tests:  Recent Labs Lab 10/09/15 0145  AST 21  ALT 19  ALKPHOS 61  BILITOT 0.4    PROT 5.9*  ALBUMIN 2.8*   No results for input(s): LIPASE, AMYLASE in the last 168 hours. No results for input(s): AMMONIA in the last 168 hours. CBC:  Recent Labs Lab 10/09/15 0145 10/10/15 0656 10/11/15 0819  WBC 20.9* 18.3* 13.3*  NEUTROABS 18.7*  --  11.0*  HGB 11.1* 10.2* 9.8*  HCT 35.6* 32.2* 30.8*  MCV 91.5 91.7 91.4  PLT 202 209 186   Cardiac Enzymes:  Recent Labs Lab 10/09/15 0145 10/11/15 1420  TROPONINI <0.03 <0.03   BNP: Invalid input(s): POCBNP CBG: No results for input(s): GLUCAP in the last 168 hours.  Recent Results (from the past 240 hour(s))  Urine culture     Status: None   Collection Time: 10/09/15  2:19 AM  Result Value Ref Range Status   Specimen Description URINE, CATHETERIZED  Final   Special Requests NONE  Final   Culture   Final    >=100,000 COLONIES/mL AEROCOCCUS URINAE Standardized susceptibility testing for this organism is not available. Performed at [facility]    Report Status 10/10/2015 FINAL  Final  Culture, blood (Routine X 2) w Reflex to  ID Panel     Status: None (Preliminary result)   Collection Time: 10/09/15  3:15 AM  Result Value Ref Range Status   Specimen Description BLOOD RIGHT HAND  Final   Special Requests BOTTLES DRAWN AEROBIC ONLY 2CC  Final   Culture NO GROWTH 2 DAYS  Final   Report Status PENDING  Incomplete  Culture, blood (Routine X 2) w Reflex to ID Panel     Status: None (Preliminary result)   Collection Time: 10/09/15  4:00 AM  Result Value Ref Range Status   Specimen Description BLOOD RIGHT FOREARM  Final   Special Requests BOTTLES DRAWN AEROBIC ONLY 2CC  Final   Culture NO GROWTH 2 DAYS  Final   Report Status PENDING  Incomplete     Studies:              All Imaging reviewed and is as per above notation   Scheduled Meds: . aspirin EC  81 mg Oral Daily  . atorvastatin  20 mg Oral QHS  . budesonide-formoterol  2 puff Inhalation BID  . cefUROXime  500 mg Oral BID WC  . enoxaparin  (LOVENOX) injection  40 mg Subcutaneous Q24H  . ipratropium-albuterol  3 mL Nebulization Q6H  . linagliptin  5 mg Oral q morning - 10a  . magnesium oxide  400 mg Oral BID  . metFORMIN  1,000 mg Oral QHS  . metFORMIN  500 mg Oral Q breakfast  . metoprolol tartrate  12.5 mg Oral BID  . pantoprazole  40 mg Oral Daily  . predniSONE  5 mg Oral Q breakfast  . tamoxifen  20 mg Oral Daily   Continuous Infusions:    Assessment/Plan:  1. CAP -Narrow Azithromycin and Rocephin-->Ceftin 500 bid.  SLP input 1/5=no aspiration.  CBC + Diff in am.  Immunocompromised 2/2 to both Cancer/Rh Arthritis on steroids. Afebrile +  WBC 20-->18-->13 so infection improving 2. 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  Follow cs.  Expect to narrow to orals in 24 hrs 3. SVT-Magnesium 1.6-replace with Mag-Ox 400 bid.  Added Metopolol 12.5 bid but pressure may not tolerate.  EKG shows LVH + peaked T wave-will get echo- Kyphosis can also cause electrical alteration to baseline  4. Rh Arthritis on Chr Prednisone-stable.  Continue home dose steroids prednisone 5 mg daily.  Not overtly septic and BP normal so no need stress dosing 5. DM ty II-continue Linagliptin 5 q am, Metformin per home regimen BID 6. Htn, history of-not currently on home meds for this.. Monitor 7. Breast cancer s/p Mastectomy-continue Tamoxifen 20 qd    Appt with PCP: none Code Status:  Full code presumed Family Communication:  family +-daughter at bedside Marlana Salvage is a retired RN] Disposition Plan: home with family maybe 24 hours DVT prophylaxis: SCD Consultants: NOne  Verneita Griffes, MD  [facility] Pager [phone] 10/11/2015, 4:54 PM    LOS: 2 days

## 2015-10-11 NOTE — Evaluation (Signed)
Clinical/Bedside Swallow Evaluation Patient Details  Name: [name] MRN: [id number] Date of Birth: 1926-04-12  Today's Date: 10/11/2015 Time: SLP Start Time (ACUTE ONLY): 1326 SLP Stop Time (ACUTE ONLY): 1353 SLP Time Calculation (min) (ACUTE ONLY): 27 min  Past Medical History:  Past Medical History  Diagnosis Date  . Asthma   . Hypertension   . Diabetes mellitus without complication ([place])   . Acid reflux   . Kyphosis   . Gastric ulcer   . Raynaud disease   . Anemia   . Arthritis     ra  . Cancer [facility])     breast cancer   Past Surgical History:  Past Surgical History  Procedure Laterality Date  . Hernia repair    . Breast surgery    . Mastectomy    . Simple mastectomy with axillary sentinel node biopsy Left 05/09/2015    Procedure: LEFT SIMPLE MASTECTOMY;  Surgeon: Erroll Luna, MD;  Location: [place];  Service: General;  Laterality: Left;   HPI:  [name] is an 82 yo female bedbound taken care of at home by her daughters came to ED 2 days ago diagnosed with URI. Went home got worse more weak, and today spiked a temp over 101. Her sob was worse. No n/v/d. No rashes. No sick contacts. Came back to ED, and her cxr now shows left infiltrate, she had a neg cxr 2 days ago. Pt referred for admission for pna. SLP asked to evaluate swallow due to concerns for possible aspiration given advanced age and bedbound status with PNA.    Assessment / Plan / Recommendation Clinical Impression  Pt seen at bedside with family present. Daughter provided background information. Pt is cared for at home by her daughters. She is assisted to the table for all meals and does not eat/drink in bed at home. Oral cavity assessment completed and is in excellent condition. Pt edentulous (only wears her dentures to church) and tolerates regular textures per daughter with assist for cutting up foods. Pt does present with cervical kyphosis, but able to tolerate repositioning in bed for  appropriate position for po intake. Pt able to swallow upon request and weak cough elicited with verbal cue. Pt denies difficulty swallowing, but states that occasionally she coughs if something feels "like it went down the wrong way". Pt presented with ice chips, water via teaspoon, cup, and straw (pt/family preference at home), puree, and regular textures (graham cracker) and shows no overt signs or symptoms consistent with aspiration, however pt is at risk given advanced age, dependent feeder status, decreased mobility, and decreased respiratory status. This was reviewed with pt/family and they verbally acknowledge understanding. SLP encouraged family to allow pt to attempt to self feed (they do at home), sit upright for all eating/drinking, elevate HOB at night, swallow 2x for each bite/sip, continue with excellent oral care, and have pt sit upright after eating/drinking. Daughter verbally acknowledges and appreciative of recommendations. They would like to continue receiving regular textures for pt (SLP suggested mechanical soft due to edentulous status) and this will be honored given that daughters are present for all of pt's meals and modify as needed for intake. No further SLP services indicated at this time.     Aspiration Risk  Mild aspiration risk    Diet Recommendation Regular;Thin liquid (family present to regulate regular textures as needed)   Liquid Administration via: Straw;Cup Medication Administration: Whole meds with liquid Supervision: Full supervision/cueing for compensatory strategies;Staff to assist with self feeding  Compensations: Slow rate;Small sips/bites;Multiple dry swallows after each bite/sip Postural Changes: Seated upright at 90 degrees;Remain upright for at least 30 minutes after po intake    Other  Recommendations Oral Care Recommendations: Oral care BID;Staff/trained caregiver to provide oral care Other Recommendations: Clarify dietary restrictions   Follow up  Recommendations  None;24 hour supervision/assistance    Frequency and Duration            Prognosis        Swallow Study   General Date of Onset: 10/08/15 HPI: Ms. [name] is an 82 yo female bedbound taken care of at home by her daughters came to ED 2 days ago diagnosed with URI. Went home got worse more weak, and today spiked a temp over 101. Her sob was worse. No n/v/d. No rashes. No sick contacts. Came back to ED, and her cxr now shows left infiltrate, she had a neg cxr 2 days ago. Pt referred for admission for pna. SLP asked to evaluate swallow due to concerns for possible aspiration given advanced age and bedbound status with PNA.  Type of Study: Bedside Swallow Evaluation Previous Swallow Assessment: none on record Diet Prior to this Study: Regular;Thin liquids Temperature Spikes Noted: No Respiratory Status: Nasal cannula (daughter removed) History of Recent Intubation: No Behavior/Cognition: Alert;Cooperative;Pleasant mood Oral Cavity Assessment: Within Functional Limits Oral Care Completed by SLP: No Oral Cavity - Dentition: Edentulous Vision: Functional for self-feeding Self-Feeding Abilities: Needs assist (needs assist for feeding due to hands) Patient Positioning: Upright in bed Baseline Vocal Quality: Normal Volitional Cough: Weak Volitional Swallow: Able to elicit    Oral/Motor/Sensory Function Overall Oral Motor/Sensory Function: Within functional limits   Ice Chips Ice chips: Within functional limits Presentation: Spoon   Thin Liquid Thin Liquid: Within functional limits Presentation: Cup;Straw;Self Fed;Spoon (hand over hand assist)    Nectar Thick Nectar Thick Liquid: Not tested   Honey Thick Honey Thick Liquid: Not tested   Puree Puree: Within functional limits Presentation: Spoon   Solid   Thank you,  Genene Churn, CCC-SLP [phone]     Solid: Within functional limits Presentation: Self Fed (pt edentulous but allows cracker to  soften in oral cavity)       Vaida Kerchner 10/11/2015,2:10 PM

## 2015-10-11 NOTE — Progress Notes (Signed)
Patient continues to be 105-110 and has had some episodes of nonsustained tachycardia 140-150. Dr. Verlon Au aware.

## 2015-10-12 ENCOUNTER — Inpatient Hospital Stay (HOSPITAL_COMMUNITY): Payer: Medicare Other

## 2015-10-12 LAB — CBC WITH DIFFERENTIAL/PLATELET
BASOS ABS: 0 10*3/uL (ref 0.0–0.1)
Basophils Relative: 0 %
EOS ABS: 0.2 10*3/uL (ref 0.0–0.7)
EOS PCT: 2 %
HCT: 29.8 % — ABNORMAL LOW (ref 36.0–46.0)
Hemoglobin: 9.5 g/dL — ABNORMAL LOW (ref 12.0–15.0)
Lymphocytes Relative: 15 %
Lymphs Abs: 1.5 10*3/uL (ref 0.7–4.0)
MCH: 28.9 pg (ref 26.0–34.0)
MCHC: 31.9 g/dL (ref 30.0–36.0)
MCV: 90.6 fL (ref 78.0–100.0)
MONO ABS: 0.6 10*3/uL (ref 0.1–1.0)
Monocytes Relative: 6 %
Neutro Abs: 7.6 10*3/uL (ref 1.7–7.7)
Neutrophils Relative %: 77 %
PLATELETS: 220 10*3/uL (ref 150–400)
RBC: 3.29 MIL/uL — AB (ref 3.87–5.11)
RDW: 13.3 % (ref 11.5–15.5)
WBC: 9.8 10*3/uL (ref 4.0–10.5)

## 2015-10-12 LAB — BASIC METABOLIC PANEL
ANION GAP: 7 (ref 5–15)
BUN: 9 mg/dL (ref 6–20)
CALCIUM: 8.5 mg/dL — AB (ref 8.9–10.3)
CO2: 30 mmol/L (ref 22–32)
Chloride: 103 mmol/L (ref 101–111)
Creatinine, Ser: 0.61 mg/dL (ref 0.44–1.00)
GFR calc Af Amer: >60 mL/min (ref >60)
GLUCOSE: 156 mg/dL — AB (ref 65–99)
POTASSIUM: 3.7 mmol/L (ref 3.5–5.1)
SODIUM: 140 mmol/L (ref 135–145)

## 2015-10-12 LAB — MAGNESIUM: MAGNESIUM: 1.8 mg/dL (ref 1.7–2.4)

## 2015-10-12 MED ORDER — METOPROLOL SUCCINATE ER 25 MG PO TB24: 25.0000 mg | ORAL_TABLET | Freq: Every day | ORAL | Status: DC

## 2015-10-12 MED ORDER — CEFUROXIME AXETIL 500 MG PO TABS: 500.0000 mg | ORAL_TABLET | Freq: Two times a day (BID) | ORAL | Status: DC

## 2015-10-12 NOTE — Care Management Note (Signed)
Case Management Note  Patient Details  Name: [name] MRN: [id number] Date of Birth: 1926/02/02  Subjective/Objective:                    Action/Plan:   Expected Discharge Date:                  Expected Discharge Plan:  [place]  In-House Referral:  NA  Discharge planning Services  CM Consult  Post Acute Care Choice:  Home Health Choice offered to:  Adult Children  DME Arranged:    DME Agency:  [place]  HH Arranged:  PT [facility] Agency:     Status of Service:  Completed, signed off  Medicare Important Message Given:  Yes Date Medicare IM Given:    Medicare IM give by:    Date Additional Medicare IM Given:    Additional Medicare Important Message give by:     If discussed at [place] of Stay Meetings, dates discussed:    Additional Comments: Pt discharged home today with [facility] (per family choice). [facility] will be 10/17/15. Pt has home O2 in place at home. No further CM needs noted. Pt and pts nurse aware of discharge arrangements. Christinia Gully Achille, RN 10/12/2015, 11:06 AM

## 2015-10-12 NOTE — Progress Notes (Signed)
Pt wears 2lpm cann at night for sleep... spo2 at treatment was 87-89% on room air o2 added at 2lpm cann

## 2015-10-12 NOTE — Care Management Important Message (Signed)
Important Message  Patient Details  Name: [name] MRN: [id number] Date of Birth: 10-13-1925   Medicare Important Message Given:  Yes    Joylene Draft, RN 10/12/2015, 10:56 AM

## 2015-10-12 NOTE — Discharge Summary (Signed)
Physician Discharge Summary  [name] [id number] DOB: Jul 01, 1926 DOA: 10/08/2015  PCP: Moshe Cipro, MD  Admit date: 10/08/2015 Discharge date: 10/12/2015  Time spent: 35 minutes  Recommendations for Outpatient Follow-up:   1. Cbc + diff and Bmet + mag in 1 week at PCP office 2. To continue chronic home oxygen at home 18 and go home or work sent over to 3. Complete Ceftin 500 twice a day on 10/14/15 4. Restarted metoprolol XL 25 this admission for SVT--used to be on this as an outpatient but discontinue secondary to hypotension. Advised reevaluation with either cardiologist or PCP as an outpatient. Advised the daughters to check pulse rate and blood pressures at home and daughter is a [employer] scientist and is aware of how to do this. 5. Advice CXR 2 VW 1 month denote clearing 6. [facility] ordered at d/c home   Discharge Diagnoses:  Principal Problem:   CAP (community acquired pneumonia) Active Problems:   Rheumatoid arthritis ([place])   HTN (hypertension)   Diabetes type 2, controlled ([place])   UTI (urinary tract infection)   Kyphosis   Pressure ulcer   Discharge Condition: Fair   Diet recommendation: Regular   Filed Weights   10/08/15 2252 10/09/15 0620  Weight: 48.081 kg (106 lb) 47 kg (103 lb 9.9 oz)    History of present illness:   53 ? HTN,   rheumatoid arthritiswith moderate kyphosis/chronic prednisone Breast cancer S/P mastectomy/tamoxifen diabetes mellitus type 2 HTN  recent emergency room visit 12/28 with cough  represented to [facility] emergency 1/3/17with 2 days of upper respiratory symptoms and fever >101 no contacts CXR = left infiltrate WBC on admission 20.9 hemoglobin 11.1 BUN/creatinine 25/0.9 over baseline 15/0.7 Lactic acid 1.9   Hospital Course:     1. CAP -Narrow Azithromycin and Rocephin-->Ceftin 500 bid 1 /8/17 stop date .  SLP input 1/5=no aspiration.  discussion Immunocompromised 2/2 to both Cancer/Rh Arthritis on steroids. Afebrile +  WBC  20-->18-->13-->9 so infection improving 2. Aerococcus Urinae Pyelonephritis-susceptible based on clinical response   3. SVT-Magnesium 1.6-replace with Mag-Ox 400 bid.  Added Metopolol 12.5 bid but pressure may not tolerate as outpatient so consolidated to metoprolol 25 XL daily EKG shows LVH + peaked T wave-no overt chest pain so held echocardiogram Kyphosis can also cause electrical alteration to baseline. Check magnesium as outpatient 4. Rh Arthritis on Chr Prednisone-stable.  Continue home dose steroids prednisone 5 mg daily.  Not overtly septic and BP normal so no need stress dosing 5. DM ty II-continue Linagliptin 5 q am, Metformin per home regimen BID 6. Htn, history of-not currently on home meds for this.. Monitor 7. Breast cancer s/p Mastectomy-continue Tamoxifen 20 qd     Discharge Exam: Filed Vitals:   10/12/15 0616 10/12/15 0927  BP: 109/50 114/64  Pulse: 94 147  Temp: 98.1 F (36.7 C)   Resp: 20     General: Alert pleasant still coughing a little card S1-S2 no murmur rub or gallop  Respiratory: Clinically clear with some crackles and rales in the right posterior lung field   Discharge Instructions   Discharge Instructions    Diet - low sodium heart healthy    Complete by:  As directed      Discharge instructions    Complete by:  As directed   Monitor blood pressure and pulse carefully one this     Increase activity slowly    Complete by:  As directed  Current Discharge Medication List    START taking these medications   Details  cefUROXime (CEFTIN) 500 MG tablet Take 1 tablet (500 mg total) by mouth 2 (two) times daily with a meal. Qty: 4 tablet, Refills: 0    metoprolol succinate (TOPROL XL) 25 MG 24 hr tablet Take 1 tablet (25 mg total) by mouth daily. Qty: 30 tablet, Refills: 0      CONTINUE these medications which have NOT CHANGED   Details  albuterol (PROVENTIL HFA) 108 (90 BASE) MCG/ACT inhaler Inhale 2 puffs into the lungs every 6 (six)  hours as needed for shortness of breath.     albuterol (PROVENTIL) (2.5 MG/3ML) 0.083% nebulizer solution Take 2.5 mg by nebulization daily. With Ipratropium as needed for shortness of breath    aspirin EC 81 MG tablet Take 81 mg by mouth daily.     atorvastatin (LIPITOR) 20 MG tablet Take 20 mg by mouth at bedtime.    budesonide-formoterol (SYMBICORT) 160-4.5 MCG/ACT inhaler Inhale 2 puffs into the lungs 2 (two) times daily.    Chlorpheniramine-Acetaminophen (CORICIDIN HBP COLD/FLU PO) Take 1 capsule by mouth every 6 (six) hours as needed.    Cholecalciferol (VITAMIN D3) 1000 UNITS CAPS Take 1 capsule by mouth daily.    ferrous sulfate 325 (65 FE) MG tablet Take 325 mg by mouth daily. Refills: 3    fish oil-omega-3 fatty acids 1000 MG capsule Take 1 g by mouth every morning.    ipratropium (ATROVENT) 0.02 % nebulizer solution Take 500 mcg by nebulization daily. With albuterol as needed for shortness of breath    lactobacillus acidophilus & bulgar (LACTINEX) chewable tablet CHEW 1 TABLET BY MOUTH EVERY DAY Refills: 6    linagliptin (TRADJENTA) 5 MG TABS tablet Take 5 mg by mouth every morning.    metFORMIN (GLUCOPHAGE) 500 MG tablet Take 500-1,000 mg by mouth 2 (two) times daily. 1 in the morning and 2 at bedtime    Multiple Vitamins-Minerals (CENTRUM SILVER ADULT 50+ PO) Take 1 tablet by mouth every morning.     pantoprazole (PROTONIX) 40 MG tablet Take 40 mg by mouth daily.    Polyvinyl Alcohol-Povidone (CLEAR EYES ALL SEASONS) 5-6 MG/ML SOLN Apply 1 drop to eye every morning.     predniSONE (DELTASONE) 5 MG tablet Take 5 mg by mouth daily with breakfast.    raloxifene (EVISTA) 60 MG tablet Take 60 mg by mouth every morning.    tamoxifen (NOLVADEX) 20 MG tablet TAKE 1 TABLET EVERY DAY Qty: 30 tablet, Refills: 3    traMADol (ULTRAM) 50 MG tablet Take 1 tablet (50 mg total) by mouth every 6 (six) hours as needed (pain). Qty: 30 tablet, Refills: 1       Allergies   Allergen Reactions  . Aspirin Other (See Comments)    Cannot take uncoated due to stomach ulcers      The results of significant diagnostics from this hospitalization (including imaging, microbiology, ancillary and laboratory) are listed below for reference.    Significant Diagnostic Studies: Dg Chest 2 View  10/03/2015  CLINICAL DATA:  Cold symptoms for by 1 week. Cough, congestion, worsening the past couple days. EXAM: CHEST  2 VIEW COMPARISON:  06/30/2015 FINDINGS: Mild elevation of the left hemidiaphragm. Gaseous distention of the stomach. Lungs are clear. Heart is normal size. No effusions or acute bony abnormality. IMPRESSION: No active cardiopulmonary disease. Electronically Signed   By: Rolm Baptise M.D.   On: 10/03/2015 15:27   Dg Chest [facility] 1 [address]  10/09/2015  CLINICAL DATA:  Productive cough and congestion for 1 week. Increased weakness. Fever tonight. EXAM: PORTABLE CHEST 1 VIEW COMPARISON:  10/03/2015 FINDINGS: Patient's chin obscures the left lung apex. Development of patchy opacity at the left lung base and left midlung zone. Minimal right basilar atelectasis. Cardiomediastinal contours are unchanged. No evidence pneumothorax or large pleural effusion. Surgical clips in the left axilla. IMPRESSION: Development of patchy left lung opacity concerning for pneumonia. Followup PA and lateral chest X-ray is recommended in 3-4 weeks following trial of antibiotic therapy to ensure resolution and exclude underlying malignancy. Electronically Signed   By: Jeb Levering M.D.   On: 10/09/2015 02:14    Microbiology: Recent Results (from the past 240 hour(s))  Urine culture     Status: None   Collection Time: 10/09/15  2:19 AM  Result Value Ref Range Status   Specimen Description URINE, CATHETERIZED  Final   Special Requests NONE  Final   Culture   Final    >=100,000 COLONIES/mL AEROCOCCUS URINAE Standardized susceptibility testing for this organism is not available. Performed at  [facility]    Report Status 10/10/2015 FINAL  Final  Culture, blood (Routine X 2) w Reflex to ID Panel     Status: None (Preliminary result)   Collection Time: 10/09/15  3:15 AM  Result Value Ref Range Status   Specimen Description BLOOD RIGHT HAND  Final   Special Requests BOTTLES DRAWN AEROBIC ONLY 2CC  Final   Culture NO GROWTH 3 DAYS  Final   Report Status PENDING  Incomplete  Culture, blood (Routine X 2) w Reflex to ID Panel     Status: None (Preliminary result)   Collection Time: 10/09/15  4:00 AM  Result Value Ref Range Status   Specimen Description BLOOD RIGHT FOREARM  Final   Special Requests BOTTLES DRAWN AEROBIC ONLY 2CC  Final   Culture NO GROWTH 3 DAYS  Final   Report Status PENDING  Incomplete     Labs: Basic Metabolic Panel:  Recent Labs Lab 10/09/15 0145 10/10/15 0656 10/11/15 0819 10/11/15 1420 10/12/15 0558  NA 136 138 137  --  140  K 4.1 3.8 4.1 4.7 3.7  CL 101 106 103  --  103  CO2 28 27 27   --  30  GLUCOSE 342* 234* 190*  --  156*  BUN 25* 12 8  --  9  CREATININE 0.91 0.67 0.65  --  0.61  CALCIUM 9.1 8.0* 8.4*  --  8.5*  MG  --   --   --  1.6* 1.8   Liver Function Tests:  Recent Labs Lab 10/09/15 0145  AST 21  ALT 19  ALKPHOS 61  BILITOT 0.4  PROT 5.9*  ALBUMIN 2.8*   No results for input(s): LIPASE, AMYLASE in the last 168 hours. No results for input(s): AMMONIA in the last 168 hours. CBC:  Recent Labs Lab 10/09/15 0145 10/10/15 0656 10/11/15 0819 10/12/15 0558  WBC 20.9* 18.3* 13.3* 9.8  NEUTROABS 18.7*  --  11.0* 7.6  HGB 11.1* 10.2* 9.8* 9.5*  HCT 35.6* 32.2* 30.8* 29.8*  MCV 91.5 91.7 91.4 90.6  PLT 202 209 186 220   Cardiac Enzymes:  Recent Labs Lab 10/09/15 0145 10/11/15 1420  TROPONINI <0.03 <0.03   BNP: BNP (last 3 results) No results for input(s): BNP in the last 8760 hours.  ProBNP (last 3 results) No results for input(s): PROBNP in the last 8760 hours.  CBG: No results for input(s): GLUCAP  in  the last 168 hours.     Signed:  Nita Sells MD  FACP  [facility] 10/12/2015, 10:45 AM

## 2015-10-12 NOTE — Progress Notes (Signed)
Discharge instructions read to patient hand her family. All questions answered.  Discharged to home with family

## 2015-10-14 LAB — CULTURE, BLOOD (ROUTINE X 2)
Culture: NO GROWTH
Culture: NO GROWTH

## 2016-01-03 ENCOUNTER — Other Ambulatory Visit (HOSPITAL_COMMUNITY): Payer: Self-pay | Admitting: Hematology & Oncology

## 2016-01-23 NOTE — Assessment & Plan Note (Addendum)
Stage IIA  Left breast cancer, ER-, PR 1%+, Her2 -.  S/P left breast mastectomy on 05/09/2015.  Due to inability to lay flat, she is not a candidate for radiation therapy.  She is a poor systemic chemotherapy candidate as well.  Currently on Tamoxifen 20 mg daily.  Oncology history is up to date.   Labs were scheduled for today given recent consultation demonstrating anemia, but patient had laboratory work performed by her primary care physician on 01/17/2016 and I was able to ascertain a copy of these labs and therefore lab appointment was canceled. Of note, WBC is 11.5, hemoglobin back to baseline of 11.1 g/dL, platelet count 184,000, electrolytes within normal limits except for hyperglycemia at 201, hypoalbuminemia at 2.9. Hemoglobin A1c is 7.0%.  Labs in 4 months: CBC with differential, complete metabolic panel.  Return in 4 months for follow-up.  I've spaced out her appointments from 3 months to 4 months given the patient's debility and difficulty getting the patient to the clinic.

## 2016-01-23 NOTE — Progress Notes (Signed)
Moshe Cipro, MD [address] [place] [address]  Breast cancer of upper-outer quadrant of left female breast ([place]) - Plan: CBC with Differential, Comprehensive metabolic panel, CBC with Differential, Comprehensive metabolic panel  Anemia, unspecified anemia type - Plan: CBC with Differential, Comprehensive metabolic panel, CBC with Differential, CANCELED: Vitamin B12, CANCELED: Folate, CANCELED: Iron and TIBC, CANCELED: Ferritin, CANCELED: Reticulocytes  CURRENT THERAPY: Tamoxifen 20 mg daily beginning on 05/22/2015.  INTERVAL HISTORY: [name] 82 y.o. female returns for followup of Stage IIA  Left breast cancer, ER-, PR 1%+, Her2 -.  She is S/P mastectomy on 05/09/2015 and now on anti-estrogen therapy consisting of Tamoxifen beginning on 05/22/2015.    Breast cancer of upper-outer quadrant of left female breast ([place])   03/20/2015 Initial Diagnosis Breast, left, needle core biopsy, upper outer quadrant - INVASIVE DUCTAL CARCINOMA. - LYMPHOVASCULAR INVASION IS IDENTIFIED.   05/09/2015 Definitive Surgery Diagnosis 1. Breast, simple mastectomy, Left - INVASIVE GRADE III DUCTAL CARCINOMA, SPANNING 4.8 CM IN GREATEST DIMENSION. - MARGINS ARE NEGATIVE. - SEE ONCOLOGY TEMPLATE. 2. Lymph nodes, regional resection, Left axillary contents - FIVE BENIGN LYMP   05/22/2015 -  Anti-estrogen oral therapy Tamoxifen 20 mg daily     I personally reviewed and went over laboratory results with the patient.  The results are noted within this dictation.  Labs will be updated today.  Chart is reviewed.  Hospitalization from 1/2- 10/12/2015 for CAP.  Labs demonstrated a progressive anemia and therefore, we will check labs and confirm improvement with a minimal peripheral anemia work-up.  She is tolerating Tamoxifen well.  She does note some hot flashes, but these have improved since her last encounter she reports.  She denies any arthralgias or myalgias above baseline.  She is compliant with  Tamoxifen. She denies any vaginal bleeding.  She denies any complaints today. She admits to good days and bad days with regards to her rheumatoid arthritis.   Past Medical History  Diagnosis Date  . Asthma   . Hypertension   . Diabetes mellitus without complication ([place])   . Acid reflux   . Kyphosis   . Gastric ulcer   . Raynaud disease   . Anemia   . Arthritis     ra  . Cancer [facility])     breast cancer    has Rheumatoid arthritis ([place]); HTN (hypertension); Diabetes type 2, controlled ([place]); GERD (gastroesophageal reflux disease); Breast cancer of upper-outer quadrant of left female breast ([place]); Severe sepsis ([place]); UTI (urinary tract infection); CAP (community acquired pneumonia); Kyphosis; UTI (lower urinary tract infection); and Pressure ulcer on her problem list.     is allergic to aspirin.  Current Outpatient Prescriptions on File Prior to Visit  Medication Sig Dispense Refill  . albuterol (PROVENTIL HFA) 108 (90 BASE) MCG/ACT inhaler Inhale 2 puffs into the lungs every 6 (six) hours as needed for shortness of breath.     Marland Kitchen albuterol (PROVENTIL) (2.5 MG/3ML) 0.083% nebulizer solution Take 2.5 mg by nebulization daily. With Ipratropium as needed for shortness of breath    . aspirin EC 81 MG tablet Take 81 mg by mouth daily.     Marland Kitchen atorvastatin (LIPITOR) 20 MG tablet Take 20 mg by mouth at bedtime.    . budesonide-formoterol (SYMBICORT) 160-4.5 MCG/ACT inhaler Inhale 2 puffs into the lungs 2 (two) times daily.    . cefUROXime (CEFTIN) 500 MG tablet Take 1 tablet (500 mg total) by mouth 2 (two) times daily with  a meal. 4 tablet 0  . Chlorpheniramine-Acetaminophen (CORICIDIN HBP COLD/FLU PO) Take 1 capsule by mouth every 6 (six) hours as needed.    . Cholecalciferol (VITAMIN D3) 1000 UNITS CAPS Take 1 capsule by mouth daily.    . ferrous sulfate 325 (65 FE) MG tablet Take 325 mg by mouth daily.  3  . fish oil-omega-3 fatty acids 1000 MG capsule Take 1 g by mouth every morning.      Marland Kitchen ipratropium (ATROVENT) 0.02 % nebulizer solution Take 500 mcg by nebulization daily. With albuterol as needed for shortness of breath    . lactobacillus acidophilus & bulgar (LACTINEX) chewable tablet CHEW 1 TABLET BY MOUTH EVERY DAY  6  . linagliptin (TRADJENTA) 5 MG TABS tablet Take 5 mg by mouth every morning.    . metFORMIN (GLUCOPHAGE) 500 MG tablet Take 500-1,000 mg by mouth 2 (two) times daily. 1 in the morning and 2 at bedtime    . metoprolol succinate (TOPROL XL) 25 MG 24 hr tablet Take 1 tablet (25 mg total) by mouth daily. 30 tablet 0  . Multiple Vitamins-Minerals (CENTRUM SILVER ADULT 50+ PO) Take 1 tablet by mouth every morning.     . pantoprazole (PROTONIX) 40 MG tablet Take 40 mg by mouth daily.    . Polyvinyl Alcohol-Povidone (CLEAR EYES ALL SEASONS) 5-6 MG/ML SOLN Apply 1 drop to eye every morning.     . predniSONE (DELTASONE) 5 MG tablet Take 5 mg by mouth daily with breakfast.    . raloxifene (EVISTA) 60 MG tablet Take 60 mg by mouth every morning.    . tamoxifen (NOLVADEX) 20 MG tablet TAKE 1 TABLET EVERY DAY 30 tablet 2  . traMADol (ULTRAM) 50 MG tablet Take 1 tablet (50 mg total) by mouth every 6 (six) hours as needed (pain). 30 tablet 1   No current facility-administered medications on file prior to visit.    Past Surgical History  Procedure Laterality Date  . Hernia repair    . Breast surgery    . Mastectomy    . Simple mastectomy with axillary sentinel node biopsy Left 05/09/2015    Procedure: LEFT SIMPLE MASTECTOMY;  Surgeon: Erroll Luna, MD;  Location: [place];  Service: General;  Laterality: Left;    Denies any headaches, dizziness, double vision, fevers, chills, night sweats, nausea, vomiting, diarrhea, constipation, chest pain, heart palpitations, shortness of breath, blood in stool, black tarry stool, urinary pain, urinary burning, urinary frequency, hematuria.   PHYSICAL EXAMINATION  ECOG PERFORMANCE STATUS: 2 - Symptomatic, <50% confined to  bed  Filed Vitals:   01/24/16 0925  Pulse: 85  Temp: 98 F (36.7 C)  Resp: 18    GENERAL:alert, no distress, well nourished, well developed, comfortable, cooperative, smiling and accompanied by 3 daughters SKIN: skin color, texture, turgor are normal, no rashes or significant lesions HEAD: Normocephalic, No masses, lesions, tenderness or abnormalities EYES: normal, PERRLA, EOMI, Conjunctiva are pink and non-injected EARS: External ears normal OROPHARYNX:mucous membranes are moist  NECK: supple, trachea midline LYMPH:  not examined BREAST:not examined LUNGS: clear to auscultation bilaterally with decreased breath sounds secondary to respiratory effort with decreased by basilar breath sounds without wheezes, rales, or rhonchi. HEART: regular rate & rhythm, no murmurs and no gallops.  Normal S1 and S2. ABDOMEN:abdomen soft, non-tender and normal bowel sounds BACK: Back symmetric, no curvature., Kyphotic. EXTREMITIES:less then 2 second capillary refill, no joint deformities, effusion, or inflammation, no skin discoloration, no cyanosis, ulnar deviation, DIP contractures, swan neck deformities, and  other classical joint findings associated with RA. NEURO: alert & oriented x 3 with fluent speech, no focal motor/sensory deficits, in a wheelchair.   LABORATORY DATA:  She had labs performed by her primary care physician on 01/17/2016 and I was able to ascertain a copy of these labs and therefore lab appointment was canceled. Of note, WBC is 11.5, hemoglobin back to baseline of 11.1 g/dL, platelet count 184,000, electrolytes within normal limits except for hyperglycemia at 201, hypoalbuminemia at 2.9. Hemoglobin A1c is 7.0%.    PENDING LABS:   RADIOGRAPHIC STUDIES:  No results found.   PATHOLOGY:    ASSESSMENT AND PLAN:  Breast cancer of upper-outer quadrant of left female breast ([place]) Stage IIA  Left breast cancer, ER-, PR 1%+, Her2 -.  S/P left breast mastectomy on 05/09/2015.   Due to inability to lay flat, she is not a candidate for radiation therapy.  She is a poor systemic chemotherapy candidate as well.  Currently on Tamoxifen 20 mg daily.  Oncology history is up to date.   Labs were scheduled for today given recent consultation demonstrating anemia, but patient had laboratory work performed by her primary care physician on 01/17/2016 and I was able to ascertain a copy of these labs and therefore lab appointment was canceled. Of note, WBC is 11.5, hemoglobin back to baseline of 11.1 g/dL, platelet count 184,000, electrolytes within normal limits except for hyperglycemia at 201, hypoalbuminemia at 2.9. Hemoglobin A1c is 7.0%.  Labs in 4 months: CBC with differential, complete metabolic panel.  Return in 4 months for follow-up.  I've spaced out her appointments from 3 months to 4 months given the patient's debility and difficulty getting the patient to the clinic.     THERAPY PLAN:  Continue Tamoxifen daily.    NCCN guidelines recommends the following surveillance for invasive breast cancer (2.2016):  A. History and Physical exam 1-4 times per year as clinically appropriate for 5 years, then annually.  B. Periodic screening for changes in family history and referral to genetics counseling as indicated  C. Educate, monitor, and refer to lymphedema management.  D. Mammography every 12 months  E. Routine imaging of reconstructed breast is not indicated.  F. In the absence of clinical signs and symptoms suggestive of recurrent disease, there is no indication for laboratory or imaging studies for metastases screening.  G. Women on Tamoxifen: annual gynecologic assessment every 12 months if uterus is present.  H. Women on aromatase inhibitor or who experience ovarian failure secondary to treatment should have monitoring of bone health with a bone mineral density determination at baseline and periodically thereafter.  I. Assess and encourage adherence to adjuvant  endocrine therapy.  J. Evidence suggests that active lifestyle, healthy diet, limited alcohol intake, and achieving and maintaining an ideal body weight (20-25 BMI) may lead to optimal breast cancer outcomes.    All questions were answered. The patient knows to call the clinic with any problems, questions or concerns. We can certainly see the patient much sooner if necessary.  Patient and plan discussed with Dr. Ancil Linsey and she is in agreement with the aforementioned.   This note is electronically signed by: Doy Mince 01/24/2016 3:08 PM

## 2016-01-24 ENCOUNTER — Ambulatory Visit (HOSPITAL_COMMUNITY): Payer: Medicare Other | Admitting: Oncology

## 2016-01-24 ENCOUNTER — Encounter (HOSPITAL_COMMUNITY): Payer: Self-pay | Admitting: Oncology

## 2016-01-24 ENCOUNTER — Encounter (HOSPITAL_COMMUNITY): Payer: Medicare Other | Attending: Oncology | Admitting: Oncology

## 2016-01-24 ENCOUNTER — Other Ambulatory Visit (HOSPITAL_COMMUNITY): Payer: Medicare Other

## 2016-01-24 VITALS — HR 85 | Temp 98.0°F | Resp 18

## 2016-01-24 DIAGNOSIS — C50412 Malignant neoplasm of upper-outer quadrant of left female breast: Secondary | ICD-10-CM | POA: Diagnosis present

## 2016-01-24 DIAGNOSIS — D649 Anemia, unspecified: Secondary | ICD-10-CM | POA: Diagnosis not present

## 2016-01-24 NOTE — Patient Instructions (Signed)
[place] at [facility] Discharge Instructions  RECOMMENDATIONS MADE BY THE CONSULTANT AND ANY TEST RESULTS WILL BE SENT TO YOUR REFERRING PHYSICIAN.  Exam done and seen today by Kirby Crigler Will look at labs form Dr.Caplan's office today. Labs in 4 months, and maybe today depending on results from other labs Return to see the Doctor in 4 months Continue Tamoxifen daily Call the clinic for any concerns or questions.   Thank you for choosing [place] at [facility] to provide your oncology and hematology care.  To afford each patient quality time with our provider, please arrive at least 15 minutes before your scheduled appointment time.   Beginning January 23rd 2017 lab work for the [facility] will be done in the  Main lab at [facility] on 1st floor. If you have a lab appointment with the [facility] please come in thru the  Main Entrance and check in at the main information desk  You need to re-schedule your appointment should you arrive 10 or more minutes late.  We strive to give you quality time with our providers, and arriving late affects you and other patients whose appointments are after yours.  Also, if you no show three or more times for appointments you may be dismissed from the clinic at the providers discretion.     Again, thank you for choosing [facility].  Our hope is that these requests will decrease the amount of time that you wait before being seen by our physicians.       _____________________________________________________________  Should you have questions after your visit to [facility], please contact our office at [phone] between the hours of 8:30 a.m. and 4:30 p.m.  Voicemails left after 4:30 p.m. will not be returned until the following business day.  For prescription refill requests, have your pharmacy contact our office.         Resources For Cancer Patients and their  Caregivers ? American Cancer Society: Can assist with transportation, wigs, general needs, runs Look Good Feel Better.        (530) 742-6047 ? Cancer Care: Provides financial assistance, online support groups, medication/co-pay assistance.  1-800-813-HOPE 949-684-7403) ? [facility] Assists [place] cancer patients and their families through emotional , educational and financial support.  248-706-9846 ? [place] DSS Where to apply for food stamps, Medicaid and utility assistance. 901-089-1533 ? [employer]: Transportation to medical appointments. 860-731-7219 ? Social Security Administration: May apply for disability if have a Stage IV cancer. 207-267-2492 325-367-7986 ? [employer], Disability and Transit Services: Assists with nutrition, care and transit needs. [phone]

## 2016-03-08 ENCOUNTER — Encounter (HOSPITAL_COMMUNITY): Payer: Self-pay | Admitting: Emergency Medicine

## 2016-03-08 ENCOUNTER — Emergency Department (HOSPITAL_COMMUNITY): Payer: Medicare Other

## 2016-03-08 ENCOUNTER — Inpatient Hospital Stay (HOSPITAL_COMMUNITY)
Admission: EM | Admit: 2016-03-08 | Discharge: 2016-03-09 | DRG: 872 | Disposition: A | Discharge status: Home / Self Care | Payer: Medicare Other | Attending: Internal Medicine | Admitting: Internal Medicine

## 2016-03-08 DIAGNOSIS — Z7982 Long term (current) use of aspirin: Secondary | ICD-10-CM

## 2016-03-08 DIAGNOSIS — R509 Fever, unspecified: Secondary | ICD-10-CM | POA: Diagnosis present

## 2016-03-08 DIAGNOSIS — E872 Acidosis: Secondary | ICD-10-CM | POA: Diagnosis not present

## 2016-03-08 DIAGNOSIS — Z7984 Long term (current) use of oral hypoglycemic drugs: Secondary | ICD-10-CM | POA: Diagnosis not present

## 2016-03-08 DIAGNOSIS — A419 Sepsis, unspecified organism: Principal | ICD-10-CM | POA: Diagnosis present

## 2016-03-08 DIAGNOSIS — C50412 Malignant neoplasm of upper-outer quadrant of left female breast: Secondary | ICD-10-CM | POA: Diagnosis present

## 2016-03-08 DIAGNOSIS — N390 Urinary tract infection, site not specified: Secondary | ICD-10-CM | POA: Diagnosis present

## 2016-03-08 DIAGNOSIS — K219 Gastro-esophageal reflux disease without esophagitis: Secondary | ICD-10-CM | POA: Diagnosis present

## 2016-03-08 DIAGNOSIS — Z79810 Long term (current) use of selective estrogen receptor modulators (SERMs): Secondary | ICD-10-CM | POA: Diagnosis not present

## 2016-03-08 DIAGNOSIS — E1165 Type 2 diabetes mellitus with hyperglycemia: Secondary | ICD-10-CM | POA: Diagnosis present

## 2016-03-08 DIAGNOSIS — E119 Type 2 diabetes mellitus without complications: Secondary | ICD-10-CM

## 2016-03-08 DIAGNOSIS — Z9012 Acquired absence of left breast and nipple: Secondary | ICD-10-CM | POA: Diagnosis not present

## 2016-03-08 DIAGNOSIS — J45909 Unspecified asthma, uncomplicated: Secondary | ICD-10-CM | POA: Diagnosis present

## 2016-03-08 DIAGNOSIS — Z8711 Personal history of peptic ulcer disease: Secondary | ICD-10-CM | POA: Diagnosis not present

## 2016-03-08 DIAGNOSIS — N179 Acute kidney failure, unspecified: Secondary | ICD-10-CM | POA: Diagnosis present

## 2016-03-08 DIAGNOSIS — M069 Rheumatoid arthritis, unspecified: Secondary | ICD-10-CM | POA: Diagnosis not present

## 2016-03-08 DIAGNOSIS — I10 Essential (primary) hypertension: Secondary | ICD-10-CM | POA: Diagnosis present

## 2016-03-08 DIAGNOSIS — E785 Hyperlipidemia, unspecified: Secondary | ICD-10-CM | POA: Diagnosis present

## 2016-03-08 DIAGNOSIS — Z825 Family history of asthma and other chronic lower respiratory diseases: Secondary | ICD-10-CM

## 2016-03-08 LAB — CBC WITH DIFFERENTIAL/PLATELET
BASOS ABS: 0 10*3/uL (ref 0.0–0.1)
BASOS PCT: 0 %
EOS PCT: 1 %
Eosinophils Absolute: 0.1 10*3/uL (ref 0.0–0.7)
HCT: 29.9 % — ABNORMAL LOW (ref 36.0–46.0)
Hemoglobin: 9.7 g/dL — ABNORMAL LOW (ref 12.0–15.0)
Lymphocytes Relative: 10 %
Lymphs Abs: 1.3 10*3/uL (ref 0.7–4.0)
MCH: 29.5 pg (ref 26.0–34.0)
MCHC: 32.4 g/dL (ref 30.0–36.0)
MCV: 90.9 fL (ref 78.0–100.0)
MONO ABS: 0.6 10*3/uL (ref 0.1–1.0)
Monocytes Relative: 4 %
Neutro Abs: 11.3 10*3/uL — ABNORMAL HIGH (ref 1.7–7.7)
Neutrophils Relative %: 85 %
PLATELETS: 210 10*3/uL (ref 150–400)
RBC: 3.29 MIL/uL — ABNORMAL LOW (ref 3.87–5.11)
RDW: 13.3 % (ref 11.5–15.5)
WBC: 13.2 10*3/uL — ABNORMAL HIGH (ref 4.0–10.5)

## 2016-03-08 LAB — COMPREHENSIVE METABOLIC PANEL
ALK PHOS: 47 U/L (ref 38–126)
ALT: 14 U/L (ref 14–54)
ANION GAP: 8 (ref 5–15)
AST: 19 U/L (ref 15–41)
Albumin: 2.3 g/dL — ABNORMAL LOW (ref 3.5–5.0)
BILIRUBIN TOTAL: 0.3 mg/dL (ref 0.3–1.2)
BUN: 20 mg/dL (ref 6–20)
CALCIUM: 8 mg/dL — AB (ref 8.9–10.3)
CO2: 25 mmol/L (ref 22–32)
Chloride: 98 mmol/L — ABNORMAL LOW (ref 101–111)
Creatinine, Ser: 1.17 mg/dL — ABNORMAL HIGH (ref 0.44–1.00)
GFR calc Af Amer: 47 mL/min — ABNORMAL LOW (ref >60)
GFR, EST NON AFRICAN AMERICAN: 41 mL/min — AB (ref >60)
Glucose, Bld: 361 mg/dL — ABNORMAL HIGH (ref 65–99)
POTASSIUM: 4.7 mmol/L (ref 3.5–5.1)
Sodium: 131 mmol/L — ABNORMAL LOW (ref 135–145)
TOTAL PROTEIN: 5.4 g/dL — AB (ref 6.5–8.1)

## 2016-03-08 LAB — URINALYSIS, ROUTINE W REFLEX MICROSCOPIC
BILIRUBIN URINE: NEGATIVE
KETONES UR: NEGATIVE mg/dL
NITRITE: POSITIVE — AB
PROTEIN: 30 mg/dL — AB
Specific Gravity, Urine: 1.01 (ref 1.005–1.030)
pH: 6 (ref 5.0–8.0)

## 2016-03-08 LAB — URINE MICROSCOPIC-ADD ON

## 2016-03-08 LAB — PROTIME-INR
INR: 1.13 (ref 0.00–1.49)
Prothrombin Time: 14.7 s (ref 11.6–15.2)

## 2016-03-08 LAB — APTT: aPTT: 27 seconds (ref 24–37)

## 2016-03-08 LAB — I-STAT CG4 LACTIC ACID, ED
LACTIC ACID, VENOUS: 3.41 mmol/L — AB (ref 0.5–2.0)
Lactic Acid, Venous: 1.87 mmol/L (ref 0.5–2.0)

## 2016-03-08 LAB — GLUCOSE, CAPILLARY
Glucose-Capillary: 230 mg/dL — ABNORMAL HIGH (ref 65–99)
Glucose-Capillary: 239 mg/dL — ABNORMAL HIGH (ref 65–99)

## 2016-03-08 LAB — LACTIC ACID, PLASMA
LACTIC ACID, VENOUS: 1.1 mmol/L (ref 0.5–2.0)
Lactic Acid, Venous: 3 mmol/L (ref 0.5–2.0)

## 2016-03-08 LAB — PROCALCITONIN: Procalcitonin: 0.3 ng/mL

## 2016-03-08 MED ORDER — OMEGA-3 FATTY ACIDS 1000 MG PO CAPS: 1.0000 g | ORAL_CAPSULE | Freq: Every morning | ORAL | Status: DC

## 2016-03-08 MED ORDER — LINAGLIPTIN 5 MG PO TABS
5.0000 mg | ORAL_TABLET | Freq: Every morning | ORAL | Status: DC
  Administered 2016-03-09: 5 mg via ORAL
  Filled 2016-03-08: qty 1

## 2016-03-08 MED ORDER — SODIUM CHLORIDE 0.9 % IV BOLUS (SEPSIS)
500.0000 mL | Freq: Once | INTRAVENOUS | Status: AC
  Administered 2016-03-08: 500 mL via INTRAVENOUS

## 2016-03-08 MED ORDER — ALBUTEROL SULFATE (2.5 MG/3ML) 0.083% IN NEBU
INHALATION_SOLUTION | RESPIRATORY_TRACT | Status: AC
  Filled 2016-03-08: qty 3

## 2016-03-08 MED ORDER — SODIUM CHLORIDE 0.9 % IV SOLN
INTRAVENOUS | Status: DC
  Administered 2016-03-08: 125 mL/h via INTRAVENOUS

## 2016-03-08 MED ORDER — SODIUM CHLORIDE 0.9 % IV SOLN
INTRAVENOUS | Status: DC
  Administered 2016-03-09: 03:00:00 via INTRAVENOUS

## 2016-03-08 MED ORDER — TRAMADOL HCL 50 MG PO TABS: 50.0000 mg | ORAL_TABLET | Freq: Four times a day (QID) | ORAL | Status: DC | PRN

## 2016-03-08 MED ORDER — HYDROCORTISONE NA SUCCINATE PF 100 MG IJ SOLR
50.0000 mg | Freq: Once | INTRAMUSCULAR | Status: AC
  Administered 2016-03-08: 50 mg via INTRAVENOUS

## 2016-03-08 MED ORDER — IPRATROPIUM BROMIDE 0.02 % IN SOLN
RESPIRATORY_TRACT | Status: AC
  Filled 2016-03-08: qty 2.5

## 2016-03-08 MED ORDER — ATORVASTATIN CALCIUM 20 MG PO TABS
20.0000 mg | ORAL_TABLET | Freq: Every day | ORAL | Status: DC
  Administered 2016-03-08: 20 mg via ORAL
  Filled 2016-03-08: qty 1

## 2016-03-08 MED ORDER — ACETAMINOPHEN 325 MG PO TABS
650.0000 mg | ORAL_TABLET | Freq: Once | ORAL | Status: AC
  Administered 2016-03-08: 650 mg via ORAL
  Filled 2016-03-08: qty 2

## 2016-03-08 MED ORDER — METOPROLOL SUCCINATE ER 25 MG PO TB24
25.0000 mg | ORAL_TABLET | Freq: Every day | ORAL | Status: DC
  Administered 2016-03-09: 25 mg via ORAL
  Filled 2016-03-08: qty 1

## 2016-03-08 MED ORDER — HYDROCORTISONE NA SUCCINATE PF 100 MG IJ SOLR
50.0000 mg | Freq: Four times a day (QID) | INTRAMUSCULAR | Status: AC
  Administered 2016-03-08 – 2016-03-09 (×3): 50 mg via INTRAVENOUS
  Filled 2016-03-08 (×4): qty 2

## 2016-03-08 MED ORDER — ONDANSETRON HCL 4 MG/2ML IJ SOLN: 4.0000 mg | Freq: Four times a day (QID) | INTRAMUSCULAR | Status: DC | PRN

## 2016-03-08 MED ORDER — INSULIN ASPART 100 UNIT/ML ~~LOC~~ SOLN
0.0000 [IU] | Freq: Three times a day (TID) | SUBCUTANEOUS | Status: DC
  Administered 2016-03-08 – 2016-03-09 (×3): 3 [IU] via SUBCUTANEOUS

## 2016-03-08 MED ORDER — SODIUM CHLORIDE 0.9 % IV BOLUS (SEPSIS)
1000.0000 mL | Freq: Once | INTRAVENOUS | Status: AC
  Administered 2016-03-08: 1000 mL via INTRAVENOUS

## 2016-03-08 MED ORDER — ALBUTEROL SULFATE (2.5 MG/3ML) 0.083% IN NEBU: 3.0000 mL | INHALATION_SOLUTION | Freq: Four times a day (QID) | RESPIRATORY_TRACT | Status: DC | PRN

## 2016-03-08 MED ORDER — OMEGA-3-ACID ETHYL ESTERS 1 G PO CAPS
1.0000 g | ORAL_CAPSULE | Freq: Every day | ORAL | Status: DC
  Administered 2016-03-09: 1 g via ORAL
  Filled 2016-03-08: qty 1

## 2016-03-08 MED ORDER — IPRATROPIUM-ALBUTEROL 0.5-2.5 (3) MG/3ML IN SOLN
3.0000 mL | Freq: Every day | RESPIRATORY_TRACT | Status: DC
  Administered 2016-03-09: 3 mL via RESPIRATORY_TRACT
  Filled 2016-03-08 (×2): qty 3

## 2016-03-08 MED ORDER — MOMETASONE FURO-FORMOTEROL FUM 200-5 MCG/ACT IN AERO
2.0000 | INHALATION_SPRAY | Freq: Two times a day (BID) | RESPIRATORY_TRACT | Status: DC
  Administered 2016-03-09: 2 via RESPIRATORY_TRACT
  Filled 2016-03-08: qty 8.8

## 2016-03-08 MED ORDER — IPRATROPIUM-ALBUTEROL 0.5-2.5 (3) MG/3ML IN SOLN: 3.0000 mL | Freq: Once | RESPIRATORY_TRACT | Status: DC

## 2016-03-08 MED ORDER — ACETAMINOPHEN 650 MG RE SUPP: 650.0000 mg | Freq: Four times a day (QID) | RECTAL | Status: DC | PRN

## 2016-03-08 MED ORDER — SODIUM CHLORIDE 0.9 % IV SOLN: INTRAVENOUS | Status: DC

## 2016-03-08 MED ORDER — PREDNISONE 10 MG PO TABS
5.0000 mg | ORAL_TABLET | Freq: Every day | ORAL | Status: DC
  Administered 2016-03-09: 5 mg via ORAL
  Filled 2016-03-08: qty 1

## 2016-03-08 MED ORDER — DEXTROSE 5 % IV SOLN
2.0000 g | Freq: Once | INTRAVENOUS | Status: AC
  Administered 2016-03-08: 2 g via INTRAVENOUS
  Filled 2016-03-08: qty 2

## 2016-03-08 MED ORDER — HYDROCORTISONE NA SUCCINATE PF 100 MG IJ SOLR
50.0000 mg | Freq: Once | INTRAMUSCULAR | Status: AC
  Administered 2016-03-08: 50 mg via INTRAVENOUS
  Filled 2016-03-08: qty 2

## 2016-03-08 MED ORDER — VITAMIN D 1000 UNITS PO TABS
1000.0000 [IU] | ORAL_TABLET | Freq: Every day | ORAL | Status: DC
  Administered 2016-03-09: 1000 [IU] via ORAL
  Filled 2016-03-08: qty 1

## 2016-03-08 MED ORDER — PANTOPRAZOLE SODIUM 40 MG PO TBEC
40.0000 mg | DELAYED_RELEASE_TABLET | Freq: Every day | ORAL | Status: DC
  Administered 2016-03-09: 40 mg via ORAL
  Filled 2016-03-08: qty 1

## 2016-03-08 MED ORDER — CICLOPIROX 8 % EX SOLN
Freq: Every day | CUTANEOUS | Status: DC
  Administered 2016-03-08: 22:00:00 via TOPICAL

## 2016-03-08 MED ORDER — TAMOXIFEN CITRATE 10 MG PO TABS
20.0000 mg | ORAL_TABLET | Freq: Every day | ORAL | Status: DC
  Administered 2016-03-09: 20 mg via ORAL
  Filled 2016-03-08: qty 2

## 2016-03-08 MED ORDER — ONDANSETRON HCL 4 MG PO TABS: 4.0000 mg | ORAL_TABLET | Freq: Four times a day (QID) | ORAL | Status: DC | PRN

## 2016-03-08 MED ORDER — IPRATROPIUM BROMIDE 0.02 % IN SOLN: 500.0000 ug | Freq: Every day | RESPIRATORY_TRACT | Status: DC

## 2016-03-08 MED ORDER — ASPIRIN EC 81 MG PO TBEC
81.0000 mg | DELAYED_RELEASE_TABLET | Freq: Every day | ORAL | Status: DC
  Administered 2016-03-09: 81 mg via ORAL
  Filled 2016-03-08: qty 1

## 2016-03-08 MED ORDER — SODIUM CHLORIDE 0.9 % IV SOLN
INTRAVENOUS | Status: DC
  Administered 2016-03-08: 17:00:00 via INTRAVENOUS

## 2016-03-08 MED ORDER — ENOXAPARIN SODIUM 30 MG/0.3ML ~~LOC~~ SOLN
30.0000 mg | SUBCUTANEOUS | Status: DC
  Administered 2016-03-08: 30 mg via SUBCUTANEOUS
  Filled 2016-03-08: qty 0.3

## 2016-03-08 MED ORDER — SODIUM CHLORIDE 0.9 % IV SOLN: Freq: Once | INTRAVENOUS | Status: DC

## 2016-03-08 MED ORDER — ACETAMINOPHEN 325 MG PO TABS: 650.0000 mg | ORAL_TABLET | Freq: Four times a day (QID) | ORAL | Status: DC | PRN

## 2016-03-08 MED ORDER — FERROUS SULFATE 325 (65 FE) MG PO TABS
325.0000 mg | ORAL_TABLET | Freq: Every day | ORAL | Status: DC
  Administered 2016-03-09: 325 mg via ORAL
  Filled 2016-03-08: qty 1

## 2016-03-08 MED ORDER — ALBUTEROL SULFATE (2.5 MG/3ML) 0.083% IN NEBU: 2.5000 mg | INHALATION_SOLUTION | Freq: Every day | RESPIRATORY_TRACT | Status: DC

## 2016-03-08 NOTE — Progress Notes (Signed)
Notified the floor MD on call of the new value and of my previous note.  New orders given and followed.

## 2016-03-08 NOTE — ED Notes (Signed)
Dr. Cyndia Diver notified Fluid bolus ordered

## 2016-03-08 NOTE — Progress Notes (Signed)
Late Entry:  Was notified called to the phone by Suzanna Obey, NT who informed me that I had a critical lab value on this patient.  I spoke with the lab personnel on the phone and they stated that we had a Lactic Acid level of 3.0.  The MD was ordered and new orders were given and followed.  03/08/16 approx 5:00 pm  1900 03/08/2016 Late entry went in to review the lactic acid lab for documentation purposes and noticed that the critical lab that was called to me was not documented.  I called the lab to talk with the oncoming lab personal and they stated that the lab was just running and there was no lab value at this time.  I am awaiting the the new lab value.

## 2016-03-08 NOTE — ED Notes (Signed)
Per family, patient seen by PCP after patient started c/o lower abd pain and urine was "milky white." Patient seen by PCP yesterday and given antibiotic for UTI. Per family patient has temp of 101.4 and has had some confusion today.

## 2016-03-08 NOTE — ED Notes (Signed)
Lactic acid 3.41 reported to Dr Eulis Foster

## 2016-03-08 NOTE — H&P (Addendum)
History and Physical    [name] [id number] DOB: [birth year] DOA: 03/08/2016  PCP: Moshe Cipro, MD  Patient coming from: Home.  Chief Complaint: Confusion and fever and discolored urine.  History obtained from patient's daughter and patient.  HPI: [name] is a 82 y.o. female with medical history significant of rheumatoid arthritis, diabetes mellitus, history of SVT on metoprolol, asthma and hyperlipidemia was brought to the ER after patient was found to have fever increased confusion and discolored urine. Patient had gone to the PCP yesterday and was given Bactrim. Despite taking which patient's family noticed that patient was still having discolored urine and patient became very confused and persistent fever. Patient had fever of 101F. In the ER patient UA is compatible with UTI but patient's mental status was back to baseline. Creatinine is mildly elevated and patient has lactic acidosis. Patient was given fluid bolus and admitted for sepsis from UTI. As per the family patient did not have any nausea vomiting diarrhea chest pain or productive cough.   ED Course: IV fluid bolus antibiotics and IV hydrocortisone was given for stress dose.  Review of Systems: As per HPI otherwise 10 point review of systems negative.    Past Medical History  Diagnosis Date  . Asthma   . Hypertension   . Diabetes mellitus without complication ([place])   . Acid reflux   . Kyphosis   . Gastric ulcer   . Raynaud disease   . Anemia   . Arthritis     ra  . Cancer [facility])     breast cancer    Past Surgical History  Procedure Laterality Date  . Hernia repair    . Breast surgery    . Mastectomy    . Simple mastectomy with axillary sentinel node biopsy Left 05/09/2015    Procedure: LEFT SIMPLE MASTECTOMY;  Surgeon: Erroll Luna, MD;  Location: [place];  Service: General;  Laterality: Left;     reports that she has never smoked. She has never used smokeless tobacco. She reports that she does  not drink alcohol or use illicit drugs.  Allergies  Allergen Reactions  . Aspirin Other (See Comments)    Cannot take uncoated due to stomach ulcers    Family History  Problem Relation Age of Onset  . Asthma Sister   . Rheum arthritis Daughter     Prior to Admission medications   Medication Sig Start Date End Date Taking? Authorizing Provider  albuterol (PROVENTIL HFA) 108 (90 BASE) MCG/ACT inhaler Inhale 2 puffs into the lungs every 6 (six) hours as needed for shortness of breath.    Yes Historical Provider, MD  albuterol (PROVENTIL) (2.5 MG/3ML) 0.083% nebulizer solution Take 2.5 mg by nebulization daily. With Ipratropium as needed for shortness of breath   Yes Historical Provider, MD  aspirin EC 81 MG tablet Take 81 mg by mouth daily.    Yes Historical Provider, MD  atorvastatin (LIPITOR) 20 MG tablet Take 20 mg by mouth at bedtime.   Yes Historical Provider, MD  budesonide-formoterol (SYMBICORT) 160-4.5 MCG/ACT inhaler Inhale 2 puffs into the lungs 2 (two) times daily.   Yes Historical Provider, MD  Chlorpheniramine-Acetaminophen (CORICIDIN HBP COLD/FLU PO) Take 1 capsule by mouth every 6 (six) hours as needed.   Yes Historical Provider, MD  Cholecalciferol (VITAMIN D3) 1000 UNITS CAPS Take 1 capsule by mouth daily.   Yes Historical Provider, MD  ciclopirox (PENLAC) 8 % solution APPLY TO FUNGAL TOENAILS EVERY DAY 02/11/16  Yes Historical Provider, MD  ferrous sulfate 325 (65 FE) MG tablet Take 325 mg by mouth daily. 07/23/15  Yes Historical Provider, MD  fish oil-omega-3 fatty acids 1000 MG capsule Take 1 g by mouth every morning.   Yes Historical Provider, MD  FREESTYLE LITE test strip USE AS DIRECTED ONCE A DAY 01/08/16  Yes Historical Provider, MD  ipratropium (ATROVENT) 0.02 % nebulizer solution Take 500 mcg by nebulization daily. With albuterol as needed for shortness of breath   Yes Historical Provider, MD  lactobacillus acidophilus & bulgar (LACTINEX) chewable tablet CHEW 1 TABLET  BY MOUTH EVERY DAY 09/17/15  Yes Historical Provider, MD  Lancets (FREESTYLE) lancets USE ONCE A DAY AS DIRECTED 10/25/15  Yes Historical Provider, MD  linagliptin (TRADJENTA) 5 MG TABS tablet Take 5 mg by mouth every morning.   Yes Historical Provider, MD  metFORMIN (GLUCOPHAGE) 500 MG tablet Take 500-1,000 mg by mouth 2 (two) times daily. 1 in the morning and 2 at bedtime   Yes Historical Provider, MD  metoprolol succinate (TOPROL XL) 25 MG 24 hr tablet Take 1 tablet (25 mg total) by mouth daily. 10/12/15  Yes Nita Sells, MD  Multiple Vitamins-Minerals (CENTRUM SILVER ADULT 50+ PO) Take 1 tablet by mouth every morning.    Yes Historical Provider, MD  pantoprazole (PROTONIX) 40 MG tablet Take 40 mg by mouth daily.   Yes Historical Provider, MD  Polyvinyl Alcohol-Povidone (CLEAR EYES ALL SEASONS) 5-6 MG/ML SOLN Apply 1 drop to eye every morning.    Yes Historical Provider, MD  predniSONE (DELTASONE) 5 MG tablet Take 5 mg by mouth daily with breakfast.   Yes Historical Provider, MD  raloxifene (EVISTA) 60 MG tablet Take 60 mg by mouth every morning.   Yes Historical Provider, MD  sulfamethoxazole-trimethoprim (BACTRIM DS,SEPTRA DS) 800-160 MG tablet Take 1 tablet by mouth 2 (two) times daily. For three days. 03/07/16  Yes Historical Provider, MD  tamoxifen (NOLVADEX) 20 MG tablet TAKE 1 TABLET EVERY DAY 01/04/16  Yes Patrici Ranks, MD  traMADol (ULTRAM) 50 MG tablet Take 1 tablet (50 mg total) by mouth every 6 (six) hours as needed (pain). 05/10/15  Yes Rolm Bookbinder, MD    Physical Exam: Filed Vitals:   03/08/16 1400 03/08/16 1436 03/08/16 1530 03/08/16 1630  BP: 102/84 107/62 105/90 91/45  Pulse: 109 100 95 91  Temp: 100.9 F (38.3 C) 100.6 F (38.1 C) 99.5 F (37.5 C) 99 F (37.2 C)  TempSrc:  Core (Comment)    Resp: 20 22 20 20   Height:      Weight:      SpO2: 100% 100% 94% 95%      Constitutional: Not in distress. Filed Vitals:   03/08/16 1400 03/08/16 1436 03/08/16  1530 03/08/16 1630  BP: 102/84 107/62 105/90 91/45  Pulse: 109 100 95 91  Temp: 100.9 F (38.3 C) 100.6 F (38.1 C) 99.5 F (37.5 C) 99 F (37.2 C)  TempSrc:  Core (Comment)    Resp: 20 22 20 20   Height:      Weight:      SpO2: 100% 100% 94% 95%   Eyes: Anicteric no pallor. ENMT: No discharge from the ears eyes nose and mouth. Neck: No mass felt. No neck rigidity. Patient has to place the neck more on the side from the rheumatoid arthritis. Respiratory: No rhonchi or crepitations. Cardiovascular: S1-S2 heard. Abdomen: Soft nontender bowel sounds present. Musculoskeletal: No edema. Deformed hands. Skin: No rash. Neurologic: Alert awake oriented to time place and person. Moves  all extremities. Psychiatric: Appears normal.   Labs on Admission: I have personally reviewed following labs and imaging studies  CBC:  Recent Labs Lab 03/08/16 1355  WBC 13.2*  NEUTROABS 11.3*  HGB 9.7*  HCT 29.9*  MCV 90.9  PLT [id number]   Basic Metabolic Panel:  Recent Labs Lab 03/08/16 1355  NA 131*  K 4.7  CL 98*  CO2 25  GLUCOSE 361*  BUN 20  CREATININE 1.17*  CALCIUM 8.0*   GFR: Estimated Creatinine Clearance: 25.7 mL/min (by C-G formula based on Cr of 1.17). Liver Function Tests:  Recent Labs Lab 03/08/16 1355  AST 19  ALT 14  ALKPHOS 47  BILITOT 0.3  PROT 5.4*  ALBUMIN 2.3*   No results for input(s): LIPASE, AMYLASE in the last 168 hours. No results for input(s): AMMONIA in the last 168 hours. Coagulation Profile: No results for input(s): INR, PROTIME in the last 168 hours. Cardiac Enzymes: No results for input(s): CKTOTAL, CKMB, CKMBINDEX, TROPONINI in the last 168 hours. BNP (last 3 results) No results for input(s): PROBNP in the last 8760 hours. HbA1C: No results for input(s): HGBA1C in the last 72 hours. CBG: No results for input(s): GLUCAP in the last 168 hours. Lipid Profile: No results for input(s): CHOL, HDL, LDLCALC, TRIG, CHOLHDL, LDLDIRECT in the last  72 hours. Thyroid Function Tests: No results for input(s): TSH, T4TOTAL, FREET4, T3FREE, THYROIDAB in the last 72 hours. Anemia Panel: No results for input(s): VITAMINB12, FOLATE, FERRITIN, TIBC, IRON, RETICCTPCT in the last 72 hours. Urine analysis:    Component Value Date/Time   COLORURINE YELLOW 03/08/2016 1309   APPEARANCEUR CLOUDY* 03/08/2016 1309   LABSPEC 1.010 03/08/2016 1309   PHURINE 6.0 03/08/2016 1309   GLUCOSEU >1000* 03/08/2016 1309   HGBUR MODERATE* 03/08/2016 1309   BILIRUBINUR NEGATIVE 03/08/2016 1309   KETONESUR NEGATIVE 03/08/2016 1309   PROTEINUR 30* 03/08/2016 1309   UROBILINOGEN 0.2 06/30/2015 1317   NITRITE POSITIVE* 03/08/2016 1309   LEUKOCYTESUR MODERATE* 03/08/2016 1309   Sepsis Labs: @LABRCNTIP (procalcitonin:4,lacticidven:4) ) Recent Results (from the past 240 hour(s))  Blood Culture (routine x 2)     Status: None (Preliminary result)   Collection Time: 03/08/16  1:55 PM  Result Value Ref Range Status   Specimen Description BLOOD GROIN  Final   Special Requests BOTTLES DRAWN AEROBIC AND ANAEROBIC 5CC  Final   Culture PENDING  Incomplete   Report Status PENDING  Incomplete  Blood Culture (routine x 2)     Status: None (Preliminary result)   Collection Time: 03/08/16  1:55 PM  Result Value Ref Range Status   Specimen Description BLOOD GROIN  Final   Special Requests BOTTLES DRAWN AEROBIC AND ANAEROBIC 5CC  Final   Culture PENDING  Incomplete   Report Status PENDING  Incomplete     Radiological Exams on Admission: Dg Chest Port 1 View  03/08/2016  CLINICAL DATA:  Altered mental status. EXAM: PORTABLE CHEST 1 VIEW COMPARISON:  10/09/2015 FINDINGS: Heart is normal size. No confluent airspace opacities or effusions. Study somewhat limited by patient rotation. No visible acute bony abnormality. IMPRESSION: No active disease. Electronically Signed   By: Rolm Baptise M.D.   On: 03/08/2016 13:43     Assessment/Plan Principal Problem:   Sepsis  ([place]) Active Problems:   Rheumatoid arthritis ([place])   Diabetes type 2, controlled ([place])   Breast cancer of upper-outer quadrant of left female breast ([place])   UTI (urinary tract infection)    #1. Sepsis secondary to UTI -  follow urine cultures blood cultures and lactate levels procalcitonin levels. Patient was febrile with leukocytosis. Lactic acid levels with associated infection patient probably meets criteria for sepsis. Continue with hydration note that patient usually runs low normal blood pressure per the family. #2. Acute renal failure probably from sepsis and low blood pressure - continue with hydration and closely follow intake output and metabolic panel. #3. Rheumatoid arthritis on prednisone therapy chronically - I have ordered 3 doses of IV hydrocortisone stress dose and patient is on prednisone. May consider further dose of stress dose if patient's blood pressure remains low. #4. Diabetes mellitus type 2 with hyperglycemia- will hold metformin while inpatient continue Tradjenta. Sliding scale coverage. I think patient's blood pressure presently is elevated which will improve with IV fluids and sliding scale coverage otherwise may need to add long-acting insulin. #5. History of asthma presently not wheezing. #6. History of breast cancer on tamoxifen.   DVT prophylaxis: Lovenox. Code Status: Full code.  Family Communication: Patient's daughters at the bedside.  Disposition Plan: Home.  Consults called: None.  Admission status: Inpatient. Telemetry. Likely stay 2 days.    Rise Patience MD [facility] Pager [phone].  If 7PM-7AM, please contact night-coverage [email or web address] Password [email or web address]  03/08/2016, 4:59 PM

## 2016-03-08 NOTE — ED Provider Notes (Signed)
CSN: [id number]     Arrival date & time 03/08/16  1139 History  By signing my name below, I, Rowan Blase, attest that this documentation has been prepared under the direction and in the presence of Noemi Chapel, MD . Electronically Signed: Rowan Blase, Scribe. 03/08/2016. 12:20 PM.   Chief Complaint  Patient presents with  . Altered Mental Status    The history is provided by the patient and the spouse. No language interpreter was used.   HPI Comments:  [name] is a 82 y.o. female with PMHx of HTN, DM and breast cancer who presents to the Emergency Department complaining of confusion beginning today. Family reports associated oral temp of 101.4 this morning and milky cloudy urine. Family states pt was seen by PCP Dr. Currie Paris yesterday after having a fever for a few days; she was started on Bacterin for a UTI. Family notes Lactinex probiotic seemed to help frequent UTI. No Tylenol given PTA today. Pt uses a wheelchair to get around. Denies change in PO intake, or new/worsening cough. There are no other known modifying factors    Past Medical History  Diagnosis Date  . Asthma   . Hypertension   . Diabetes mellitus without complication ([place])   . Acid reflux   . Kyphosis   . Gastric ulcer   . Raynaud disease   . Anemia   . Arthritis     ra  . Cancer [facility])     breast cancer   Past Surgical History  Procedure Laterality Date  . Hernia repair    . Breast surgery    . Mastectomy    . Simple mastectomy with axillary sentinel node biopsy Left 05/09/2015    Procedure: LEFT SIMPLE MASTECTOMY;  Surgeon: Erroll Luna, MD;  Location: [place] [facility];  Service: General;  Laterality: Left;   Family History  Problem Relation Age of Onset  . Asthma Sister   . Rheum arthritis Daughter    Social History  Substance Use Topics  . Smoking status: Never Smoker   . Smokeless tobacco: Never Used  . Alcohol Use: No   OB History    Gravida Para Term Preterm AB TAB SAB Ectopic Multiple Living    4 4 4       4      Review of Systems  Constitutional: Positive for fever. Negative for appetite change.  Respiratory: Negative for cough.   Psychiatric/Behavioral: Positive for confusion.  All other systems reviewed and are negative.   Allergies  Aspirin  Home Medications   Prior to Admission medications   Medication Sig Start Date End Date Taking? Authorizing Provider  albuterol (PROVENTIL HFA) 108 (90 BASE) MCG/ACT inhaler Inhale 2 puffs into the lungs every 6 (six) hours as needed for shortness of breath.    Yes Historical Provider, MD  albuterol (PROVENTIL) (2.5 MG/3ML) 0.083% nebulizer solution Take 2.5 mg by nebulization daily. With Ipratropium as needed for shortness of breath   Yes Historical Provider, MD  aspirin EC 81 MG tablet Take 81 mg by mouth daily.    Yes Historical Provider, MD  atorvastatin (LIPITOR) 20 MG tablet Take 20 mg by mouth at bedtime.   Yes Historical Provider, MD  budesonide-formoterol (SYMBICORT) 160-4.5 MCG/ACT inhaler Inhale 2 puffs into the lungs 2 (two) times daily.   Yes Historical Provider, MD  Chlorpheniramine-Acetaminophen (CORICIDIN HBP COLD/FLU PO) Take 1 capsule by mouth every 6 (six) hours as needed.   Yes Historical Provider, MD  Cholecalciferol (VITAMIN D3) 1000 UNITS CAPS Take  1 capsule by mouth daily.   Yes Historical Provider, MD  ciclopirox (PENLAC) 8 % solution APPLY TO FUNGAL TOENAILS EVERY DAY 02/11/16  Yes Historical Provider, MD  ferrous sulfate 325 (65 FE) MG tablet Take 325 mg by mouth daily. 07/23/15  Yes Historical Provider, MD  fish oil-omega-3 fatty acids 1000 MG capsule Take 1 g by mouth every morning.   Yes Historical Provider, MD  FREESTYLE LITE test strip USE AS DIRECTED ONCE A DAY 01/08/16  Yes Historical Provider, MD  ipratropium (ATROVENT) 0.02 % nebulizer solution Take 500 mcg by nebulization daily. With albuterol as needed for shortness of breath   Yes Historical Provider, MD  lactobacillus acidophilus & bulgar (LACTINEX)  chewable tablet CHEW 1 TABLET BY MOUTH EVERY DAY 09/17/15  Yes Historical Provider, MD  Lancets (FREESTYLE) lancets USE ONCE A DAY AS DIRECTED 10/25/15  Yes Historical Provider, MD  linagliptin (TRADJENTA) 5 MG TABS tablet Take 5 mg by mouth every morning.   Yes Historical Provider, MD  metFORMIN (GLUCOPHAGE) 500 MG tablet Take 500-1,000 mg by mouth 2 (two) times daily. 1 in the morning and 2 at bedtime   Yes Historical Provider, MD  metoprolol succinate (TOPROL XL) 25 MG 24 hr tablet Take 1 tablet (25 mg total) by mouth daily. 10/12/15  Yes Nita Sells, MD  Multiple Vitamins-Minerals (CENTRUM SILVER ADULT 50+ PO) Take 1 tablet by mouth every morning.    Yes Historical Provider, MD  pantoprazole (PROTONIX) 40 MG tablet Take 40 mg by mouth daily.   Yes Historical Provider, MD  Polyvinyl Alcohol-Povidone (CLEAR EYES ALL SEASONS) 5-6 MG/ML SOLN Apply 1 drop to eye every morning.    Yes Historical Provider, MD  predniSONE (DELTASONE) 5 MG tablet Take 5 mg by mouth daily with breakfast.   Yes Historical Provider, MD  raloxifene (EVISTA) 60 MG tablet Take 60 mg by mouth every morning.   Yes Historical Provider, MD  sulfamethoxazole-trimethoprim (BACTRIM DS,SEPTRA DS) 800-160 MG tablet Take 1 tablet by mouth 2 (two) times daily. For three days. 03/07/16  Yes Historical Provider, MD  tamoxifen (NOLVADEX) 20 MG tablet TAKE 1 TABLET EVERY DAY 01/04/16  Yes Patrici Ranks, MD  traMADol (ULTRAM) 50 MG tablet Take 1 tablet (50 mg total) by mouth every 6 (six) hours as needed (pain). 05/10/15  Yes Rolm Bookbinder, MD   BP 105/90 mmHg  Pulse 95  Temp(Src) 99.5 F (37.5 C) (Core (Comment))  Resp 20  Ht 5\' 2"  (1.575 m)  Wt 106 lb (48.081 kg)  BMI 19.38 kg/m2  SpO2 94%   Physical Exam  Constitutional: She is oriented to person, place, and time. She appears well-developed.  Elderly, frail  HENT:  Head: Normocephalic and atraumatic.  Eyes: Conjunctivae and EOM are normal. Pupils are equal, round, and  reactive to light.  Neck: Normal range of motion and phonation normal. Neck supple.  Cardiovascular: Normal rate and regular rhythm.   Capillary refill 3 seconds in fingers. Radial pulse present bilaterally.  Pulmonary/Chest: Effort normal and breath sounds normal. No respiratory distress. She exhibits no tenderness.  Abdominal: Soft. She exhibits no distension. There is tenderness. There is no guarding.  Mild lower abdominal tenderness   Musculoskeletal: Normal range of motion.  Contractures fingers and wrists, bilaterally, right greater than left; consistent with diagnosis of rheumatoid arthritis.  Neurological: She is alert and oriented to person, place, and time. She exhibits normal muscle tone.  Skin: Skin is warm and dry. No erythema. No pallor.  Psychiatric: She has  a normal mood and affect. Her behavior is normal. Judgment and thought content normal.  Nursing note and vitals reviewed.   ED Course  Procedures  Initial clinical impression- elderly female with fever, presenting with symptoms of urinary tract infection, and already being treated with oral antibiotic, upon arrival. SIRS positive, will initiate IV antibiotics and IV fluid bolus, 30 cc/kg, and monitor vitals.   Medications  hydrocortisone sodium succinate (SOLU-CORTEF) 100 MG injection 50 mg (not administered)  sodium chloride 0.9 % bolus 1,000 mL (0 mLs Intravenous Stopped 03/08/16 1535)    And  sodium chloride 0.9 % bolus 500 mL (0 mLs Intravenous Stopped 03/08/16 1401)  cefTRIAXone (ROCEPHIN) 2 g in dextrose 5 % 50 mL IVPB (0 g Intravenous Stopped 03/08/16 1400)  acetaminophen (TYLENOL) tablet 650 mg (650 mg Oral Given 03/08/16 1409)    Patient Vitals for the past 24 hrs:  BP Temp Temp src Pulse Resp SpO2 Height Weight  03/08/16 1530 105/90 mmHg 99.5 F (37.5 C) - 95 20 94 % - -  03/08/16 1436 107/62 mmHg 100.6 F (38.1 C) Core 100 22 100 % - -  03/08/16 1400 102/84 mmHg 100.9 F (38.3 C) - 109 20 100 % - -  03/08/16  1345 - 101.3 F (38.5 C) - 98 18 95 % - -  03/08/16 1330 - 101.7 F (38.7 C) - 100 22 94 % - -  03/08/16 1315 - 101.5 F (38.6 C) - - - - - -  03/08/16 1146 (!) 102/42 mmHg 99.8 F (37.7 C) Oral 109 20 98 % 5\' 2"  (1.575 m) 106 lb (48.081 kg)   13:50- multiple attempts for both IV access, and phlebotomy, nursing was only able to get a small IV peripheral. Therefore, I did a straight stick right groin, to obtain 20 cc of blood for testing. Patient tolerated this procedure well. Pressure held for 2 minutes to decrease bleeding post venipuncture, from the right femoral vein.  3:49 PM Reevaluation with update and discussion. After initial assessment and treatment, an updated evaluation reveals She continues to be alert, and is fairly lucid. Ashunti Schofield L   Sepsis - Repeat Assessment  Performed at:    15:49  Vitals     Blood pressure 105/90, pulse 95, temperature 99.5 F (37.5 C), temperature source Core (Comment), resp. rate 20, height 5\' 2"  (1.575 m), weight 106 lb (48.081 kg), SpO2 94 %.  Heart:     Regular rate and rhythm  Lungs:    CTA  Capillary Refill:   <2 sec  Peripheral Pulse:   Radial pulse palpable  Skin:     Normal Color  3:49 PM-Consult complete with hospitalist. Patient case explained and discussed. He agrees to admit patient for further evaluation and treatment. Call ended at 15:55   DIAGNOSTIC STUDIES:  Oxygen Saturation is 98% on RA, normal by my interpretation.    COORDINATION OF CARE:  12:15 PM Will order blood work and administer antibiotic. Discussed treatment plan with pt at bedside and pt agreed to plan.  CRITICAL CARE Performed by: Richarda Blade Total critical care time: 40 minutes Critical care time was exclusive of separately billable procedures and treating other patients. Critical care was necessary to treat or prevent imminent or life-threatening deterioration. Critical care was time spent personally by me on the following activities:  development of treatment plan with patient and/or surrogate as well as nursing, discussions with consultants, evaluation of patient's response to treatment, examination of patient, obtaining history from patient or surrogate,  ordering and performing treatments and interventions, ordering and review of laboratory studies, ordering and review of radiographic studies, pulse oximetry and re-evaluation of patient's condition.   Labs Review Labs Reviewed  COMPREHENSIVE METABOLIC PANEL - Abnormal; Notable for the following:    Sodium 131 (*)    Chloride 98 (*)    Glucose, Bld 361 (*)    Creatinine, Ser 1.17 (*)    Calcium 8.0 (*)    Total Protein 5.4 (*)    Albumin 2.3 (*)    GFR calc non Af Amer 41 (*)    GFR calc Af Amer 47 (*)    All other components within normal limits  CBC WITH DIFFERENTIAL/PLATELET - Abnormal; Notable for the following:    WBC 13.2 (*)    RBC 3.29 (*)    Hemoglobin 9.7 (*)    HCT 29.9 (*)    Neutro Abs 11.3 (*)    All other components within normal limits  URINALYSIS, ROUTINE W REFLEX MICROSCOPIC (NOT AT [facility]) - Abnormal; Notable for the following:    APPearance CLOUDY (*)    Glucose, UA >1000 (*)    Hgb urine dipstick MODERATE (*)    Protein, ur 30 (*)    Nitrite POSITIVE (*)    Leukocytes, UA MODERATE (*)    All other components within normal limits  URINE MICROSCOPIC-ADD ON - Abnormal; Notable for the following:    Squamous Epithelial / LPF 6-30 (*)    Bacteria, UA MANY (*)    All other components within normal limits  I-STAT CG4 LACTIC ACID, ED - Abnormal; Notable for the following:    Lactic Acid, Venous 3.41 (*)    All other components within normal limits  CULTURE, BLOOD (ROUTINE X 2)  CULTURE, BLOOD (ROUTINE X 2)  URINE CULTURE  I-STAT CG4 LACTIC ACID, ED    Imaging Review Dg Chest Port 1 View  03/08/2016  CLINICAL DATA:  Altered mental status. EXAM: PORTABLE CHEST 1 VIEW COMPARISON:  10/09/2015 FINDINGS: Heart is normal size. No confluent  airspace opacities or effusions. Study somewhat limited by patient rotation. No visible acute bony abnormality. IMPRESSION: No active disease. Electronically Signed   By: Rolm Baptise M.D.   On: 03/08/2016 13:43   I have personally reviewed and evaluated these images and lab results as part of my medical decision-making.   EKG Interpretation None      MDM   Final diagnoses:  UTI (lower urinary tract infection)  Sepsis, due to unspecified organism [facility])    UTI with sepsis, improved with treatment, indicating fluid responsiveness. She will require admission for observation and treatment. Doubt metabolic instability or impending vascular collapse.  Nursing Notes Reviewed/ Care Coordinated, and agree without changes. Applicable Imaging Reviewed.  Interpretation of Laboratory Data incorporated into ED treatment  Plan: Admit  I personally performed the services described in this documentation, which was scribed in my presence. The recorded information has been reviewed and is accurate.    Daleen Bo, MD 03/08/16 [phone]

## 2016-03-09 DIAGNOSIS — N3000 Acute cystitis without hematuria: Secondary | ICD-10-CM | POA: Diagnosis not present

## 2016-03-09 DIAGNOSIS — M05722 Rheumatoid arthritis with rheumatoid factor of left elbow without organ or systems involvement: Secondary | ICD-10-CM

## 2016-03-09 DIAGNOSIS — A419 Sepsis, unspecified organism: Secondary | ICD-10-CM | POA: Diagnosis not present

## 2016-03-09 DIAGNOSIS — R509 Fever, unspecified: Secondary | ICD-10-CM | POA: Diagnosis not present

## 2016-03-09 LAB — COMPREHENSIVE METABOLIC PANEL
ALBUMIN: 2.2 g/dL — AB (ref 3.5–5.0)
ALK PHOS: 44 U/L (ref 38–126)
ALT: 14 U/L (ref 14–54)
AST: 14 U/L — AB (ref 15–41)
Anion gap: 5 (ref 5–15)
BUN: 15 mg/dL (ref 6–20)
CALCIUM: 7.5 mg/dL — AB (ref 8.9–10.3)
CO2: 23 mmol/L (ref 22–32)
Chloride: 108 mmol/L (ref 101–111)
Creatinine, Ser: 0.8 mg/dL (ref 0.44–1.00)
GFR calc Af Amer: >60 mL/min (ref >60)
GFR calc non Af Amer: >60 mL/min (ref >60)
GLUCOSE: 233 mg/dL — AB (ref 65–99)
POTASSIUM: 4.4 mmol/L (ref 3.5–5.1)
Sodium: 136 mmol/L (ref 135–145)
TOTAL PROTEIN: 5.2 g/dL — AB (ref 6.5–8.1)
Total Bilirubin: 0.4 mg/dL (ref 0.3–1.2)

## 2016-03-09 LAB — CBC WITH DIFFERENTIAL/PLATELET
BASOS ABS: 0 10*3/uL (ref 0.0–0.1)
BASOS PCT: 0 %
Eosinophils Absolute: 0 10*3/uL (ref 0.0–0.7)
Eosinophils Relative: 0 %
HEMATOCRIT: 29.7 % — AB (ref 36.0–46.0)
HEMOGLOBIN: 9.7 g/dL — AB (ref 12.0–15.0)
Lymphocytes Relative: 5 %
Lymphs Abs: 0.9 10*3/uL (ref 0.7–4.0)
MCH: 29.8 pg (ref 26.0–34.0)
MCHC: 32.7 g/dL (ref 30.0–36.0)
MCV: 91.1 fL (ref 78.0–100.0)
Monocytes Absolute: 0.4 10*3/uL (ref 0.1–1.0)
Monocytes Relative: 2 %
NEUTROS ABS: 16.1 10*3/uL — AB (ref 1.7–7.7)
NEUTROS PCT: 93 %
Platelets: 219 10*3/uL (ref 150–400)
RBC: 3.26 MIL/uL — AB (ref 3.87–5.11)
RDW: 13.5 % (ref 11.5–15.5)
WBC: 17.4 10*3/uL — ABNORMAL HIGH (ref 4.0–10.5)

## 2016-03-09 LAB — GLUCOSE, CAPILLARY
GLUCOSE-CAPILLARY: 242 mg/dL — AB (ref 65–99)
Glucose-Capillary: 236 mg/dL — ABNORMAL HIGH (ref 65–99)

## 2016-03-09 MED ORDER — DEXTROSE 5 % IV SOLN
2.0000 g | INTRAVENOUS | Status: DC
  Administered 2016-03-09: 2 g via INTRAVENOUS
  Filled 2016-03-09 (×2): qty 2

## 2016-03-09 MED ORDER — ENOXAPARIN SODIUM 40 MG/0.4ML ~~LOC~~ SOLN: 40.0000 mg | SUBCUTANEOUS | Status: DC

## 2016-03-09 MED ORDER — CEPHALEXIN 500 MG PO CAPS: 500.0000 mg | ORAL_CAPSULE | Freq: Three times a day (TID) | ORAL | Status: DC

## 2016-03-09 NOTE — Progress Notes (Signed)
Pharmacy Antibiotic Note  [name] is a 82 y.o. female admitted on 03/08/2016 with sepsis.  Pharmacy has been consulted for Ceftriaxone dosing.  Plan: Ceftriaxone 2gm IV every 24 hours F/U cultures, labs, and monitor vital signs  Height: 5\' 2"  (157.5 cm) Weight: 112 lb 10.5 oz (51.1 kg) IBW/kg (Calculated) : 50.1  Temp (24hrs), Avg:99.8 F (37.7 C), Min:97.9 F (36.6 C), Max:101.7 F (38.7 C)   Recent Labs Lab 03/08/16 1355 03/08/16 1408 03/08/16 1539 03/08/16 1825 03/09/16 0556  WBC 13.2*  --   --   --  17.4*  CREATININE 1.17*  --   --   --  0.80  LATICACIDVEN 3.0* 3.41* 1.87 1.1  --     Estimated Creatinine Clearance: 38.4 mL/min (by C-G formula based on Cr of 0.8).    Allergies  Allergen Reactions  . Aspirin Other (See Comments)    Cannot take uncoated due to stomach ulcers    Antimicrobials this admission: Ceftriaxone 6/3 >>   Dose adjustments this admission: If blood cx negative, may adjust ceftriaxone dose  Microbiology results: 6/3 BCx: no growth to date 6/3 UCx: in process  Thank you for allowing pharmacy to be a part of this patient's care. Isac Sarna, BS Pharm D, [place] Clinical Pharmacist Pager [phone] 03/09/2016 9:56 AM

## 2016-03-09 NOTE — Discharge Summary (Signed)
Physician Discharge Summary  [name] [id number] DOB: 04/01/1926 DOA: 03/08/2016  PCP: Moshe Cipro, MD  Admit date: 03/08/2016 Discharge date: 03/09/2016  Time spent: 45 minutes  Recommendations for Outpatient Follow-up:  -We'll be discharged home today. -Advised to follow-up with primary care provider in 2 weeks.    Discharge Diagnoses:  Principal Problem:   Sepsis ([place]) Active Problems:   UTI (urinary tract infection)   Rheumatoid arthritis ([place])   Diabetes type 2, controlled ([place])   Breast cancer of upper-outer quadrant of left female breast [facility])   Discharge Condition: Stable and improved  Filed Weights   03/08/16 1146 03/08/16 1745 03/09/16 0541  Weight: 48.081 kg (106 lb) 49.3 kg (108 lb 11 oz) 51.1 kg (112 lb 10.5 oz)    History of present illness:  As per Dr. Hal Hope on 6/3: [name] is a 82 y.o. female with medical history significant of rheumatoid arthritis, diabetes mellitus, history of SVT on metoprolol, asthma and hyperlipidemia was brought to the ER after patient was found to have fever increased confusion and discolored urine. Patient had gone to the PCP yesterday and was given Bactrim. Despite taking which patient's family noticed that patient was still having discolored urine and patient became very confused and persistent fever. Patient had fever of 101F. In the ER patient UA is compatible with UTI but patient's mental status was back to baseline. Creatinine is mildly elevated and patient has lactic acidosis. Patient was given fluid bolus and admitted for sepsis from UTI. As per the family patient did not have any nausea vomiting diarrhea chest pain or productive cough.   ED Course: IV fluid bolus antibiotics and IV hydrocortisone was given for stress dose.  Hospital Course:   Sepsis secondary to UTI -Patient presented with fever, leukocytosis, lactic acidosis with urinalysis indicative of UTI and end organ damage as evidenced by  confusion. -Sepsis parameters have resolved, she is alert and back to baseline as per daughter at bedside. -Urine culture is still pending, prior culture from January shows Aerococcus. -We'll discharge on Keflex 500 mg 3 times a day for 5 days.   Rest of chronic conditions have been stable and home medications have not been altered.  Procedures:  None   Consultations:  None  Discharge Instructions  Discharge Instructions    Diet - low sodium heart healthy    Complete by:  As directed      Increase activity slowly    Complete by:  As directed             Medication List    STOP taking these medications        sulfamethoxazole-trimethoprim 800-160 MG tablet  Commonly known as:  BACTRIM DS,SEPTRA DS      TAKE these medications        aspirin EC 81 MG tablet  Take 81 mg by mouth daily.     atorvastatin 20 MG tablet  Commonly known as:  LIPITOR  Take 20 mg by mouth at bedtime.     budesonide-formoterol 160-4.5 MCG/ACT inhaler  Commonly known as:  SYMBICORT  Inhale 2 puffs into the lungs 2 (two) times daily.     CENTRUM SILVER ADULT 50+ PO  Take 1 tablet by mouth every morning.     cephALEXin 500 MG capsule  Commonly known as:  KEFLEX  Take 1 capsule (500 mg total) by mouth 3 (three) times daily.     ciclopirox 8 % solution  Commonly known as:  PENLAC  APPLY  TO FUNGAL TOENAILS EVERY DAY     CLEAR EYES ALL SEASONS 5-6 MG/ML Soln  Generic drug:  Polyvinyl Alcohol-Povidone  Apply 1 drop to eye every morning.     CORICIDIN HBP COLD/FLU PO  Take 1 capsule by mouth every 6 (six) hours as needed.     ferrous sulfate 325 (65 FE) MG tablet  Take 325 mg by mouth daily.     fish oil-omega-3 fatty acids 1000 MG capsule  Take 1 g by mouth every morning.     freestyle lancets  USE ONCE A DAY AS DIRECTED     FREESTYLE LITE test strip  Generic drug:  glucose blood  USE AS DIRECTED ONCE A DAY     ipratropium 0.02 % nebulizer solution  Commonly known as:   ATROVENT  Take 500 mcg by nebulization daily. With albuterol as needed for shortness of breath     lactobacillus acidophilus & bulgar chewable tablet  CHEW 1 TABLET BY MOUTH EVERY DAY     metFORMIN 500 MG tablet  Commonly known as:  GLUCOPHAGE  Take 500-1,000 mg by mouth 2 (two) times daily. 1 in the morning and 2 at bedtime     metoprolol succinate 25 MG 24 hr tablet  Commonly known as:  TOPROL XL  Take 1 tablet (25 mg total) by mouth daily.     pantoprazole 40 MG tablet  Commonly known as:  PROTONIX  Take 40 mg by mouth daily.     predniSONE 5 MG tablet  Commonly known as:  DELTASONE  Take 5 mg by mouth daily with breakfast.     PROVENTIL HFA 108 (90 Base) MCG/ACT inhaler  Generic drug:  albuterol  Inhale 2 puffs into the lungs every 6 (six) hours as needed for shortness of breath.     albuterol (2.5 MG/3ML) 0.083% nebulizer solution  Commonly known as:  PROVENTIL  Take 2.5 mg by nebulization daily. With Ipratropium as needed for shortness of breath     raloxifene 60 MG tablet  Commonly known as:  EVISTA  Take 60 mg by mouth every morning.     tamoxifen 20 MG tablet  Commonly known as:  NOLVADEX  TAKE 1 TABLET EVERY DAY     TRADJENTA 5 MG Tabs tablet  Generic drug:  linagliptin  Take 5 mg by mouth every morning.     traMADol 50 MG tablet  Commonly known as:  ULTRAM  Take 1 tablet (50 mg total) by mouth every 6 (six) hours as needed (pain).     Vitamin D3 1000 units Caps  Take 1 capsule by mouth daily.       Allergies  Allergen Reactions  . Aspirin Other (See Comments)    Cannot take uncoated due to stomach ulcers       Follow-up Information    Follow up with CAPLAN,MICHAEL, MD. Schedule an appointment as soon as possible for a visit in 2 weeks.   Specialty:  Internal Medicine   Contact information:   [address] [place] [address] [phone]        The results of significant diagnostics from this hospitalization (including imaging,  microbiology, ancillary and laboratory) are listed below for reference.    Significant Diagnostic Studies: Dg Chest Port 1 View  03/08/2016  CLINICAL DATA:  Altered mental status. EXAM: PORTABLE CHEST 1 VIEW COMPARISON:  10/09/2015 FINDINGS: Heart is normal size. No confluent airspace opacities or effusions. Study somewhat limited by patient rotation. No visible acute bony  abnormality. IMPRESSION: No active disease. Electronically Signed   By: Rolm Baptise M.D.   On: 03/08/2016 13:43    Microbiology: Recent Results (from the past 240 hour(s))  Blood Culture (routine x 2)     Status: None (Preliminary result)   Collection Time: 03/08/16  1:55 PM  Result Value Ref Range Status   Specimen Description BLOOD GROIN  Final   Special Requests BOTTLES DRAWN AEROBIC AND ANAEROBIC 5CC  Final   Culture NO GROWTH < 24 HOURS  Final   Report Status PENDING  Incomplete  Blood Culture (routine x 2)     Status: None (Preliminary result)   Collection Time: 03/08/16  1:55 PM  Result Value Ref Range Status   Specimen Description BLOOD GROIN  Final   Special Requests BOTTLES DRAWN AEROBIC AND ANAEROBIC 5CC  Final   Culture NO GROWTH < 24 HOURS  Final   Report Status PENDING  Incomplete     Labs: Basic Metabolic Panel:  Recent Labs Lab 03/08/16 1355 03/09/16 0556  NA 131* 136  K 4.7 4.4  CL 98* 108  CO2 25 23  GLUCOSE 361* 233*  BUN 20 15  CREATININE 1.17* 0.80  CALCIUM 8.0* 7.5*   Liver Function Tests:  Recent Labs Lab 03/08/16 1355 03/09/16 0556  AST 19 14*  ALT 14 14  ALKPHOS 47 44  BILITOT 0.3 0.4  PROT 5.4* 5.2*  ALBUMIN 2.3* 2.2*   No results for input(s): LIPASE, AMYLASE in the last 168 hours. No results for input(s): AMMONIA in the last 168 hours. CBC:  Recent Labs Lab 03/08/16 1355 03/09/16 0556  WBC 13.2* 17.4*  NEUTROABS 11.3* 16.1*  HGB 9.7* 9.7*  HCT 29.9* 29.7*  MCV 90.9 91.1  PLT 210 219   Cardiac Enzymes: No results for input(s): CKTOTAL, CKMB, CKMBINDEX,  TROPONINI in the last 168 hours. BNP: BNP (last 3 results) No results for input(s): BNP in the last 8760 hours.  ProBNP (last 3 results) No results for input(s): PROBNP in the last 8760 hours.  CBG:  Recent Labs Lab 03/08/16 1756 03/08/16 2151 03/09/16 0800  GLUCAP 230* 239* 242*       Signed:  HERNANDEZ ACOSTA,ESTELA  [facility] Pager: [phone] 03/09/2016, 11:32 AM

## 2016-03-09 NOTE — Progress Notes (Signed)
Patient discharged with instructions, prescription, and care notes.  Verbalized understanding via teach back.  IV was removed and the site was WNL. Patient voiced no further complaints or concerns at the time of discharge.  Appointments scheduled per instructions.  Patient left the floor via w/c with staff and family in stable condition.  Foley was removed prior to d/c as well.

## 2016-03-11 LAB — URINE CULTURE: Culture: >=100000 — AB

## 2016-03-13 LAB — CULTURE, BLOOD (ROUTINE X 2)
CULTURE: NO GROWTH
Culture: NO GROWTH

## 2016-03-14 ENCOUNTER — Encounter (HOSPITAL_COMMUNITY): Payer: Self-pay | Admitting: *Deleted

## 2016-03-14 ENCOUNTER — Emergency Department (HOSPITAL_COMMUNITY)
Admission: EM | Admit: 2016-03-14 | Discharge: 2016-03-14 | Disposition: A | Discharge status: Home / Self Care | Payer: Medicare Other | Attending: Emergency Medicine | Admitting: Emergency Medicine

## 2016-03-14 ENCOUNTER — Emergency Department (HOSPITAL_COMMUNITY): Payer: Medicare Other

## 2016-03-14 DIAGNOSIS — Z79899 Other long term (current) drug therapy: Secondary | ICD-10-CM | POA: Diagnosis not present

## 2016-03-14 DIAGNOSIS — E119 Type 2 diabetes mellitus without complications: Secondary | ICD-10-CM | POA: Insufficient documentation

## 2016-03-14 DIAGNOSIS — R05 Cough: Secondary | ICD-10-CM

## 2016-03-14 DIAGNOSIS — M1990 Unspecified osteoarthritis, unspecified site: Secondary | ICD-10-CM | POA: Insufficient documentation

## 2016-03-14 DIAGNOSIS — J45909 Unspecified asthma, uncomplicated: Secondary | ICD-10-CM | POA: Diagnosis not present

## 2016-03-14 DIAGNOSIS — Z7984 Long term (current) use of oral hypoglycemic drugs: Secondary | ICD-10-CM | POA: Diagnosis not present

## 2016-03-14 DIAGNOSIS — Z853 Personal history of malignant neoplasm of breast: Secondary | ICD-10-CM | POA: Insufficient documentation

## 2016-03-14 DIAGNOSIS — Z7982 Long term (current) use of aspirin: Secondary | ICD-10-CM | POA: Diagnosis not present

## 2016-03-14 DIAGNOSIS — R059 Cough, unspecified: Secondary | ICD-10-CM

## 2016-03-14 DIAGNOSIS — J069 Acute upper respiratory infection, unspecified: Secondary | ICD-10-CM | POA: Diagnosis not present

## 2016-03-14 DIAGNOSIS — I10 Essential (primary) hypertension: Secondary | ICD-10-CM | POA: Insufficient documentation

## 2016-03-14 MED ORDER — AZITHROMYCIN 250 MG PO TABS: ORAL_TABLET | ORAL | Status: DC

## 2016-03-14 MED ORDER — ALBUTEROL SULFATE (2.5 MG/3ML) 0.083% IN NEBU
2.5000 mg | INHALATION_SOLUTION | Freq: Once | RESPIRATORY_TRACT | Status: AC
  Administered 2016-03-14: 2.5 mg via RESPIRATORY_TRACT
  Filled 2016-03-14: qty 3

## 2016-03-14 MED ORDER — AZITHROMYCIN 250 MG PO TABS
500.0000 mg | ORAL_TABLET | Freq: Once | ORAL | Status: AC
  Administered 2016-03-14: 500 mg via ORAL
  Filled 2016-03-14: qty 2

## 2016-03-14 MED ORDER — PREDNISONE 10 MG PO TABS
5.0000 mg | ORAL_TABLET | Freq: Once | ORAL | Status: AC
  Administered 2016-03-14: 5 mg via ORAL
  Filled 2016-03-14: qty 1

## 2016-03-14 NOTE — ED Provider Notes (Signed)
CSN: [id number]     Arrival date & time 03/14/16  1927 History  By signing my name below, I, [name], attest that this documentation has been prepared under the direction and in the presence of physician practitioner, Nat Christen, MD. Electronically Signed: [name], Scribe. 03/14/2016. 9:52 PM.     Chief Complaint  Patient presents with  . Cough    The history is provided by the patient and a relative. No language interpreter was used.     HPI Comments: [name] is a 82 y.o. female brought in by her daughters, with h/o asthma, rheumatoid arthritis, breast cancer, and DM who presents to the Emergency Department complaining of sudden onset, constant, dry cough and fatigue for the past 2 days. Pt endorses lower abdominal pain, chest congestion, and wheezing as well. Her daughters note concern for pneumonia. Per her daughters, pt has been sleeping a lot more than usual since onset. Per her daughters, pt has been eating normally. Pt has an inhaler and nebulizer at home for her asthma; she also uses 5 mg prednisone every other day for her breathing. Pt had 5 mg Prednisone today. Pt is currently on Keflex for a recent UTI. Pt lives at home and one of her daughters takes care of her. Pt is not ambulatory. She denies nausea, vomiting, fever, or any other associated symptoms. Her PCP is Dr. Deatra Ina in [place].  Past Medical History  Diagnosis Date  . Asthma   . Hypertension   . Diabetes mellitus without complication ([place])   . Acid reflux   . Kyphosis   . Gastric ulcer   . Raynaud disease   . Anemia   . Arthritis     ra  . Cancer [facility])     breast cancer   Past Surgical History  Procedure Laterality Date  . Hernia repair    . Breast surgery    . Mastectomy    . Simple mastectomy with axillary sentinel node biopsy Left 05/09/2015    Procedure: LEFT SIMPLE MASTECTOMY;  Surgeon: Erroll Luna, MD;  Location: [facility] OR;  Service: General;  Laterality: Left;   Family History  Problem  Relation Age of Onset  . Asthma Sister   . Rheum arthritis Daughter    Social History  Substance Use Topics  . Smoking status: Never Smoker   . Smokeless tobacco: Never Used  . Alcohol Use: No   OB History    Gravida Para Term Preterm AB TAB SAB Ectopic Multiple Living   4 4 4       4      Review of Systems  A complete 10 system review of systems was obtained and all systems are negative except as noted in the HPI and PMH.   Allergies  Aspirin  Home Medications   Prior to Admission medications   Medication Sig Start Date End Date Taking? Authorizing Provider  albuterol (PROVENTIL HFA) 108 (90 BASE) MCG/ACT inhaler Inhale 2 puffs into the lungs every 6 (six) hours as needed for shortness of breath.    Yes Historical Provider, MD  albuterol (PROVENTIL) (2.5 MG/3ML) 0.083% nebulizer solution Take 2.5 mg by nebulization daily. With Ipratropium as needed for shortness of breath   Yes Historical Provider, MD  aspirin EC 81 MG tablet Take 81 mg by mouth daily.    Yes Historical Provider, MD  atorvastatin (LIPITOR) 20 MG tablet Take 20 mg by mouth at bedtime.   Yes Historical Provider, MD  budesonide-formoterol (SYMBICORT) 160-4.5 MCG/ACT inhaler Inhale  2 puffs into the lungs 2 (two) times daily.   Yes Historical Provider, MD  cephALEXin (KEFLEX) 500 MG capsule Take 1 capsule (500 mg total) by mouth 3 (three) times daily. 03/09/16  Yes Estela Leonie Green, MD  Chlorpheniramine-Acetaminophen (CORICIDIN HBP COLD/FLU PO) Take 1 capsule by mouth every 6 (six) hours as needed (for cold symptoms).    Yes Historical Provider, MD  Cholecalciferol (VITAMIN D3) 1000 UNITS CAPS Take 2,000 Units by mouth daily.    Yes Historical Provider, MD  ciclopirox (PENLAC) 8 % solution APPLY TO FUNGAL TOENAILS EVERY DAY 02/11/16  Yes Historical Provider, MD  ferrous sulfate 325 (65 FE) MG tablet Take 325 mg by mouth daily. 07/23/15  Yes Historical Provider, MD  ipratropium (ATROVENT) 0.02 % nebulizer solution  Take 500 mcg by nebulization daily. With albuterol as needed for shortness of breath   Yes Historical Provider, MD  lactobacillus acidophilus & bulgar (LACTINEX) chewable tablet CHEW 1 TABLET BY MOUTH EVERY DAY 09/17/15  Yes Historical Provider, MD  linagliptin (TRADJENTA) 5 MG TABS tablet Take 5 mg by mouth every morning.   Yes Historical Provider, MD  Magnesium ([employer] TRIPLE MAGNESIUM COMPLEX) 400 MG CAPS Take 1 capsule by mouth daily.   Yes Historical Provider, MD  metFORMIN (GLUCOPHAGE) 500 MG tablet Take 500-1,000 mg by mouth 2 (two) times daily. 1 in the morning and 2 at bedtime   Yes Historical Provider, MD  metoprolol succinate (TOPROL XL) 25 MG 24 hr tablet Take 1 tablet (25 mg total) by mouth daily. 10/12/15  Yes Nita Sells, MD  Multiple Vitamins-Minerals (CENTRUM SILVER ADULT 50+ PO) Take 1 tablet by mouth every morning.    Yes Historical Provider, MD  Omega-3 Fatty Acids (FISH OIL) 1200 MG CAPS Take 1 capsule by mouth daily.   Yes Historical Provider, MD  pantoprazole (PROTONIX) 40 MG tablet Take 40 mg by mouth daily.   Yes Historical Provider, MD  Polyvinyl Alcohol-Povidone (CLEAR EYES ALL SEASONS) 5-6 MG/ML SOLN Apply 1 drop to eye every morning.    Yes Historical Provider, MD  predniSONE (DELTASONE) 5 MG tablet Take 5 mg by mouth daily with breakfast.   Yes Historical Provider, MD  raloxifene (EVISTA) 60 MG tablet Take 60 mg by mouth every morning.   Yes Historical Provider, MD  tamoxifen (NOLVADEX) 20 MG tablet TAKE 1 TABLET EVERY DAY 01/04/16  Yes Patrici Ranks, MD  traMADol (ULTRAM) 50 MG tablet Take 1 tablet (50 mg total) by mouth every 6 (six) hours as needed (pain). 05/10/15  Yes Rolm Bookbinder, MD  azithromycin (ZITHROMAX) 250 MG tablet One tablet daily for 4 more days starting Saturday evening. 03/14/16   Nat Christen, MD   BP 150/80 mmHg  Pulse 94  Temp(Src) 98.8 F (37.1 C) (Oral)  Resp 16  Ht 5\' 2"  (1.575 m)  Wt 106 lb (48.081 kg)  BMI 19.38 kg/m2  SpO2  98% Physical Exam  Constitutional: She is oriented to person, place, and time. She appears well-developed and well-nourished.  HENT:  Head: Normocephalic and atraumatic.  Eyes: Conjunctivae and EOM are normal. Pupils are equal, round, and reactive to light.  Neck: Normal range of motion. Neck supple.  Cardiovascular: Normal rate and regular rhythm.   Pulmonary/Chest: Effort normal. No respiratory distress. She has wheezes.  No acute respiratory distress. Bilateral expiatory wheezing.  Abdominal: Soft. Bowel sounds are normal.  Musculoskeletal: Normal range of motion.  Severe kyphosis.  Neurological: She is alert and oriented to person, place, and time.  Skin: Skin is warm and dry.  Psychiatric: She has a normal mood and affect. Her behavior is normal.  Nursing note and vitals reviewed.   ED Course  Procedures (including critical care time)  DIAGNOSTIC STUDIES: Oxygen Saturation is 98% on RA, normal by my interpretation.    COORDINATION OF CARE: 9:52 PM Will order DG Chest [facility]. Will order blood work and Urinalysis. Discussed treatment plan with pt and her daughters at bedside and they agreed to plan.  Results for orders placed or performed during the hospital encounter of 03/08/16  Blood Culture (routine x 2)  Result Value Ref Range   Specimen Description BLOOD RIGHT GROIN COLLECTED BY DOCTOR    Special Requests BOTTLES DRAWN AEROBIC AND ANAEROBIC 5CC    Culture NO GROWTH 5 DAYS    Report Status 03/13/2016 FINAL   Blood Culture (routine x 2)  Result Value Ref Range   Specimen Description BLOOD RIGHT GROIN COLLECTED BY DOCTOR [id number]    Special Requests BOTTLES DRAWN AEROBIC AND ANAEROBIC 5CC    Culture NO GROWTH 5 DAYS    Report Status 03/13/2016 FINAL   Urine culture  Result Value Ref Range   Specimen Description URINE, CATHETERIZED    Special Requests NONE    Culture >=100,000 COLONIES/mL KLEBSIELLA PNEUMONIAE (A)    Report Status 03/11/2016 FINAL    Organism  ID, Bacteria KLEBSIELLA PNEUMONIAE (A)       Susceptibility   Klebsiella pneumoniae - MIC*    AMPICILLIN >=32 RESISTANT Resistant     CEFAZOLIN <=4 SENSITIVE Sensitive     CEFTRIAXONE <=1 SENSITIVE Sensitive     CIPROFLOXACIN <=0.25 SENSITIVE Sensitive     GENTAMICIN <=1 SENSITIVE Sensitive     IMIPENEM <=0.25 SENSITIVE Sensitive     NITROFURANTOIN 64 INTERMEDIATE Intermediate     TRIMETH/SULFA <=20 SENSITIVE Sensitive     AMPICILLIN/SULBACTAM <=2 SENSITIVE Sensitive     PIP/TAZO <=4 SENSITIVE Sensitive     * >=100,000 COLONIES/mL KLEBSIELLA PNEUMONIAE  Comprehensive metabolic panel  Result Value Ref Range   Sodium 131 (L) 135 - 145 mmol/L   Potassium 4.7 3.5 - 5.1 mmol/L   Chloride 98 (L) 101 - 111 mmol/L   CO2 25 22 - 32 mmol/L   Glucose, Bld 361 (H) 65 - 99 mg/dL   BUN 20 6 - 20 mg/dL   Creatinine, Ser 1.17 (H) 0.44 - 1.00 mg/dL   Calcium 8.0 (L) 8.9 - 10.3 mg/dL   Total Protein 5.4 (L) 6.5 - 8.1 g/dL   Albumin 2.3 (L) 3.5 - 5.0 g/dL   AST 19 15 - 41 U/L   ALT 14 14 - 54 U/L   Alkaline Phosphatase 47 38 - 126 U/L   Total Bilirubin 0.3 0.3 - 1.2 mg/dL   GFR calc non Af Amer 41 (L) >60 mL/min   GFR calc Af Amer 47 (L) >60 mL/min   Anion gap 8 5 - 15  CBC WITH DIFFERENTIAL  Result Value Ref Range   WBC 13.2 (H) 4.0 - 10.5 K/uL   RBC 3.29 (L) 3.87 - 5.11 MIL/uL   Hemoglobin 9.7 (L) 12.0 - 15.0 g/dL   HCT 29.9 (L) 36.0 - 46.0 %   MCV 90.9 78.0 - 100.0 fL   MCH 29.5 26.0 - 34.0 pg   MCHC 32.4 30.0 - 36.0 g/dL   RDW 13.3 11.5 - 15.5 %   Platelets 210 150 - 400 K/uL   Neutrophils Relative % 85 %   Neutro Abs 11.3 (  H) 1.7 - 7.7 K/uL   Lymphocytes Relative 10 %   Lymphs Abs 1.3 0.7 - 4.0 K/uL   Monocytes Relative 4 %   Monocytes Absolute 0.6 0.1 - 1.0 K/uL   Eosinophils Relative 1 %   Eosinophils Absolute 0.1 0.0 - 0.7 K/uL   Basophils Relative 0 %   Basophils Absolute 0.0 0.0 - 0.1 K/uL  Urinalysis, Routine w reflex microscopic (not at [facility])  Result Value Ref Range    Color, Urine YELLOW YELLOW   APPearance CLOUDY (A) CLEAR   Specific Gravity, Urine 1.010 1.005 - 1.030   pH 6.0 5.0 - 8.0   Glucose, UA >1000 (A) NEGATIVE mg/dL   Hgb urine dipstick MODERATE (A) NEGATIVE   Bilirubin Urine NEGATIVE NEGATIVE   Ketones, ur NEGATIVE NEGATIVE mg/dL   Protein, ur 30 (A) NEGATIVE mg/dL   Nitrite POSITIVE (A) NEGATIVE   Leukocytes, UA MODERATE (A) NEGATIVE  Urine microscopic-add on  Result Value Ref Range   Squamous Epithelial / LPF 6-30 (A) NONE SEEN   WBC, UA TOO NUMEROUS TO COUNT 0 - 5 WBC/hpf   RBC / HPF 6-30 0 - 5 RBC/hpf   Bacteria, UA MANY (A) NONE SEEN  Lactic acid, plasma  Result Value Ref Range   Lactic Acid, Venous 3.0 (HH) 0.5 - 2.0 mmol/L  Lactic acid, plasma  Result Value Ref Range   Lactic Acid, Venous 1.1 0.5 - 2.0 mmol/L  Procalcitonin  Result Value Ref Range   Procalcitonin 0.30 ng/mL  Protime-INR  Result Value Ref Range   Prothrombin Time 14.7 11.6 - 15.2 seconds   INR 1.13 0.00 - 1.49  APTT  Result Value Ref Range   aPTT 27 24 - 37 seconds  CBC with Differential  Result Value Ref Range   WBC 17.4 (H) 4.0 - 10.5 K/uL   RBC 3.26 (L) 3.87 - 5.11 MIL/uL   Hemoglobin 9.7 (L) 12.0 - 15.0 g/dL   HCT 29.7 (L) 36.0 - 46.0 %   MCV 91.1 78.0 - 100.0 fL   MCH 29.8 26.0 - 34.0 pg   MCHC 32.7 30.0 - 36.0 g/dL   RDW 13.5 11.5 - 15.5 %   Platelets 219 150 - 400 K/uL   Neutrophils Relative % 93 %   Neutro Abs 16.1 (H) 1.7 - 7.7 K/uL   Lymphocytes Relative 5 %   Lymphs Abs 0.9 0.7 - 4.0 K/uL   Monocytes Relative 2 %   Monocytes Absolute 0.4 0.1 - 1.0 K/uL   Eosinophils Relative 0 %   Eosinophils Absolute 0.0 0.0 - 0.7 K/uL   Basophils Relative 0 %   Basophils Absolute 0.0 0.0 - 0.1 K/uL  Comprehensive metabolic panel  Result Value Ref Range   Sodium 136 135 - 145 mmol/L   Potassium 4.4 3.5 - 5.1 mmol/L   Chloride 108 101 - 111 mmol/L   CO2 23 22 - 32 mmol/L   Glucose, Bld 233 (H) 65 - 99 mg/dL   BUN 15 6 - 20 mg/dL    Creatinine, Ser 0.80 0.44 - 1.00 mg/dL   Calcium 7.5 (L) 8.9 - 10.3 mg/dL   Total Protein 5.2 (L) 6.5 - 8.1 g/dL   Albumin 2.2 (L) 3.5 - 5.0 g/dL   AST 14 (L) 15 - 41 U/L   ALT 14 14 - 54 U/L   Alkaline Phosphatase 44 38 - 126 U/L   Total Bilirubin 0.4 0.3 - 1.2 mg/dL   GFR calc non Af Amer >60 >60 mL/min  GFR calc Af Amer >60 >60 mL/min   Anion gap 5 5 - 15  Glucose, capillary  Result Value Ref Range   Glucose-Capillary 230 (H) 65 - 99 mg/dL   Comment 1 Notify RN    Comment 2 Document in Chart   Glucose, capillary  Result Value Ref Range   Glucose-Capillary 239 (H) 65 - 99 mg/dL   Comment 1 Notify RN    Comment 2 Document in Chart   Glucose, capillary  Result Value Ref Range   Glucose-Capillary 242 (H) 65 - 99 mg/dL  Glucose, capillary  Result Value Ref Range   Glucose-Capillary 236 (H) 65 - 99 mg/dL  I-Stat CG4 Lactic Acid, ED  (not at  [facility])  Result Value Ref Range   Lactic Acid, Venous 3.41 (HH) 0.5 - 2.0 mmol/L   Comment NOTIFIED PHYSICIAN   I-Stat CG4 Lactic Acid, ED  (not at  [facility])  Result Value Ref Range   Lactic Acid, Venous 1.87 0.5 - 2.0 mmol/L   Dg Chest Port 1 View  03/14/2016  CLINICAL DATA:  Cough EXAM: PORTABLE CHEST 1 VIEW COMPARISON:  03/08/2016 FINDINGS: Cardiomediastinal silhouette is stable. There is elevation of the left hemidiaphragm. Hazy atelectasis or infiltrate in left lower lobe. No convincing pulmonary edema. Right lung is clear. Surgical clips are noted in left axilla. IMPRESSION: Elevation of the left hemidiaphragm. Hazy atelectasis or infiltrate in left lower lobe. Right lung is clear. Electronically Signed   By: Lahoma Crocker M.D.   On: 03/14/2016 21:59   Dg Chest Port 1 View  03/08/2016  CLINICAL DATA:  Altered mental status. EXAM: PORTABLE CHEST 1 VIEW COMPARISON:  10/09/2015 FINDINGS: Heart is normal size. No confluent airspace opacities or effusions. Study somewhat limited by patient rotation. No visible acute bony abnormality. IMPRESSION: No  active disease. Electronically Signed   By: Rolm Baptise M.D.   On: 03/08/2016 13:43    MDM   Final diagnoses:  URI (upper respiratory infection)   Patient is hemodynamically stable. Chest x-ray reviewed. Rx albuterol nebulizer treatment, prednisone, Zithromax. Discussed findings with the patient and her children. Will attempt outpatient management.  I personally performed the services described in this documentation, which was scribed in my presence. The recorded information has been reviewed and is accurate.    Nat Christen, MD 03/14/16 [phone]

## 2016-03-14 NOTE — ED Notes (Addendum)
Family states that pt has had a cough and has been sleeping more the since Tuesday. Pt also reports that her stomach hurts and she needs to have a good BM. Family reports pt had a BM before coming to the ED but it "wasn't a good one." Daughter also reporting that pt has had bilateral lower leg edema.

## 2016-03-14 NOTE — Discharge Instructions (Signed)
Alternate prednisone 5 mg with 2.5 mg until [name] feels better.  Prescription for antibiotic for 4 more days which is a full prescription.   I would recommend that you discuss getting a central intravenous catheter on your next primary doctor visit.

## 2016-04-11 ENCOUNTER — Inpatient Hospital Stay (HOSPITAL_COMMUNITY)
Admission: EM | Admit: 2016-04-11 | Discharge: 2016-04-15 | DRG: 871 | Disposition: A | Discharge status: Home / Self Care | Payer: Medicare Other | Attending: Internal Medicine | Admitting: Internal Medicine

## 2016-04-11 ENCOUNTER — Emergency Department (HOSPITAL_COMMUNITY): Payer: Medicare Other

## 2016-04-11 ENCOUNTER — Encounter (HOSPITAL_COMMUNITY): Payer: Self-pay | Admitting: *Deleted

## 2016-04-11 DIAGNOSIS — Z7982 Long term (current) use of aspirin: Secondary | ICD-10-CM

## 2016-04-11 DIAGNOSIS — E119 Type 2 diabetes mellitus without complications: Secondary | ICD-10-CM

## 2016-04-11 DIAGNOSIS — Z79810 Long term (current) use of selective estrogen receptor modulators (SERMs): Secondary | ICD-10-CM

## 2016-04-11 DIAGNOSIS — C50412 Malignant neoplasm of upper-outer quadrant of left female breast: Secondary | ICD-10-CM | POA: Diagnosis present

## 2016-04-11 DIAGNOSIS — N390 Urinary tract infection, site not specified: Secondary | ICD-10-CM | POA: Diagnosis present

## 2016-04-11 DIAGNOSIS — J189 Pneumonia, unspecified organism: Secondary | ICD-10-CM | POA: Diagnosis present

## 2016-04-11 DIAGNOSIS — Z9012 Acquired absence of left breast and nipple: Secondary | ICD-10-CM

## 2016-04-11 DIAGNOSIS — D649 Anemia, unspecified: Secondary | ICD-10-CM | POA: Diagnosis present

## 2016-04-11 DIAGNOSIS — Z7951 Long term (current) use of inhaled steroids: Secondary | ICD-10-CM

## 2016-04-11 DIAGNOSIS — A419 Sepsis, unspecified organism: Secondary | ICD-10-CM | POA: Diagnosis not present

## 2016-04-11 DIAGNOSIS — R509 Fever, unspecified: Secondary | ICD-10-CM | POA: Diagnosis not present

## 2016-04-11 DIAGNOSIS — Z681 Body mass index (BMI) 19 or less, adult: Secondary | ICD-10-CM

## 2016-04-11 DIAGNOSIS — Y95 Nosocomial condition: Secondary | ICD-10-CM | POA: Diagnosis present

## 2016-04-11 DIAGNOSIS — E440 Moderate protein-calorie malnutrition: Secondary | ICD-10-CM | POA: Diagnosis present

## 2016-04-11 DIAGNOSIS — J45909 Unspecified asthma, uncomplicated: Secondary | ICD-10-CM | POA: Diagnosis present

## 2016-04-11 DIAGNOSIS — I10 Essential (primary) hypertension: Secondary | ICD-10-CM | POA: Diagnosis present

## 2016-04-11 DIAGNOSIS — F0390 Unspecified dementia without behavioral disturbance: Secondary | ICD-10-CM | POA: Diagnosis present

## 2016-04-11 DIAGNOSIS — K219 Gastro-esophageal reflux disease without esophagitis: Secondary | ICD-10-CM | POA: Diagnosis present

## 2016-04-11 DIAGNOSIS — Z7952 Long term (current) use of systemic steroids: Secondary | ICD-10-CM

## 2016-04-11 DIAGNOSIS — Z7984 Long term (current) use of oral hypoglycemic drugs: Secondary | ICD-10-CM

## 2016-04-11 DIAGNOSIS — Z825 Family history of asthma and other chronic lower respiratory diseases: Secondary | ICD-10-CM

## 2016-04-11 DIAGNOSIS — Z87440 Personal history of urinary (tract) infections: Secondary | ICD-10-CM

## 2016-04-11 MED ORDER — DEXTROSE 5 % IV SOLN
2.0000 g | Freq: Once | INTRAVENOUS | Status: AC
  Administered 2016-04-11: 2 g via INTRAVENOUS
  Filled 2016-04-11: qty 2

## 2016-04-11 MED ORDER — SODIUM CHLORIDE 0.9 % IV BOLUS (SEPSIS)
500.0000 mL | Freq: Once | INTRAVENOUS | Status: AC
  Administered 2016-04-11: 500 mL via INTRAVENOUS

## 2016-04-11 MED ORDER — SODIUM CHLORIDE 0.9 % IV BOLUS (SEPSIS)
1000.0000 mL | Freq: Once | INTRAVENOUS | Status: AC
Start: 2016-04-11 — End: 2016-04-12
  Administered 2016-04-11: 1000 mL via INTRAVENOUS

## 2016-04-11 NOTE — ED Notes (Signed)
Pt was seen here and admitted for a uti and sepsis on 03-08-16. Pt saw her PCP on 03-24-2016 and her urine was "fine" according to her family. Last week, pt's urine became cloudy again and her PCP called in Cipro for her. Family states that the urine has remained cloudy and pt wasn't acting herself today. Family states her PCP called in another antibiotic today. Family states that the pt startted running a fever today around 7:30 pm. Pt was given 1000 mg of tylenol around 9:15.

## 2016-04-11 NOTE — ED Provider Notes (Signed)
CSN: [id number]     Arrival date & time 04/11/16  2243 History  By signing my name below, I, [name], attest that this documentation has been prepared under the direction and in the presence of physician practitioner, Delora Fuel, MD. Electronically Signed: Dora Young, Scribe. 04/11/2016. 11:07 PM.     Chief Complaint  Patient presents with  . Recurrent UTI    The history is provided by a relative. The history is limited by the condition of the patient (Dementia). No language interpreter was used.     HPI Comments: [name] is a 82 y.o. female brought in by her daughters who presents to the Emergency Department complaining of sudden onset fever of 103.4 beginning today. Per her daughters, pt has been dealing with recurrent UTI's for several weeks and they became concerned for another UTI when pt began running a fever tonight. Her daughters state that pt's urine became cloudy about a week ago and has remained cloudy since. Pt was prescribed Cipro by her PCP last week and was started on Bactrim DS today. Per her daughters, pt has been eating and acting normally until today when her fever presented; they note associated decreased appetite and fatigue/weakness. Pt took Tylenol tonight around 9:15 PM for her fever. She denies pain, numbness, or any other associated symptoms.  PCP: Dr. Deatra Ina  Past Medical History  Diagnosis Date  . Asthma   . Hypertension   . Diabetes mellitus without complication ([place])   . Acid reflux   . Kyphosis   . Gastric ulcer   . Raynaud disease   . Anemia   . Arthritis     ra  . Cancer [facility])     breast cancer   Past Surgical History  Procedure Laterality Date  . Hernia repair    . Breast surgery    . Mastectomy    . Simple mastectomy with axillary sentinel node biopsy Left 05/09/2015    Procedure: LEFT SIMPLE MASTECTOMY;  Surgeon: Erroll Luna, MD;  Location: [facility];  Service: General;  Laterality: Left;   Family History  Problem Relation Age of  Onset  . Asthma Sister   . Rheum arthritis Daughter    Social History  Substance Use Topics  . Smoking status: Never Smoker   . Smokeless tobacco: Never Used  . Alcohol Use: No   OB History    Gravida Para Term Preterm AB TAB SAB Ectopic Multiple Living   4 4 4       4      Review of Systems  Unable to perform ROS: Dementia  Constitutional: Positive for fever and fatigue.  Musculoskeletal: Negative for myalgias and arthralgias.  Neurological: Positive for weakness. Negative for numbness.    Allergies  Aspirin  Home Medications   Prior to Admission medications   Medication Sig Start Date End Date Taking? Authorizing Provider  albuterol (PROVENTIL HFA) 108 (90 BASE) MCG/ACT inhaler Inhale 2 puffs into the lungs every 6 (six) hours as needed for shortness of breath.     Historical Provider, MD  albuterol (PROVENTIL) (2.5 MG/3ML) 0.083% nebulizer solution Take 2.5 mg by nebulization daily. With Ipratropium as needed for shortness of breath    Historical Provider, MD  aspirin EC 81 MG tablet Take 81 mg by mouth daily.     Historical Provider, MD  atorvastatin (LIPITOR) 20 MG tablet Take 20 mg by mouth at bedtime.    Historical Provider, MD  azithromycin (ZITHROMAX) 250 MG tablet One tablet daily for 4  more days starting Saturday evening. 03/14/16   Nat Christen, MD  budesonide-formoterol [facility]) 160-4.5 MCG/ACT inhaler Inhale 2 puffs into the lungs 2 (two) times daily.    Historical Provider, MD  cephALEXin (KEFLEX) 500 MG capsule Take 1 capsule (500 mg total) by mouth 3 (three) times daily. 03/09/16   Erline Hau, MD  Chlorpheniramine-Acetaminophen (CORICIDIN HBP COLD/FLU PO) Take 1 capsule by mouth every 6 (six) hours as needed (for cold symptoms).     Historical Provider, MD  Cholecalciferol (VITAMIN D3) 1000 UNITS CAPS Take 2,000 Units by mouth daily.     Historical Provider, MD  ciclopirox (PENLAC) 8 % solution APPLY TO FUNGAL TOENAILS EVERY DAY 02/11/16   Historical  Provider, MD  ferrous sulfate 325 (65 FE) MG tablet Take 325 mg by mouth daily. 07/23/15   Historical Provider, MD  ipratropium (ATROVENT) 0.02 % nebulizer solution Take 500 mcg by nebulization daily. With albuterol as needed for shortness of breath    Historical Provider, MD  lactobacillus acidophilus & bulgar (LACTINEX) chewable tablet CHEW 1 TABLET BY MOUTH EVERY DAY 09/17/15   Historical Provider, MD  linagliptin (TRADJENTA) 5 MG TABS tablet Take 5 mg by mouth every morning.    Historical Provider, MD  Magnesium ([employer] TRIPLE MAGNESIUM COMPLEX) 400 MG CAPS Take 1 capsule by mouth daily.    Historical Provider, MD  metFORMIN (GLUCOPHAGE) 500 MG tablet Take 500-1,000 mg by mouth 2 (two) times daily. 1 in the morning and 2 at bedtime    Historical Provider, MD  metoprolol succinate (TOPROL XL) 25 MG 24 hr tablet Take 1 tablet (25 mg total) by mouth daily. 10/12/15   Nita Sells, MD  Multiple Vitamins-Minerals (CENTRUM SILVER ADULT 50+ PO) Take 1 tablet by mouth every morning.     Historical Provider, MD  Omega-3 Fatty Acids (FISH OIL) 1200 MG CAPS Take 1 capsule by mouth daily.    Historical Provider, MD  pantoprazole (PROTONIX) 40 MG tablet Take 40 mg by mouth daily.    Historical Provider, MD  Polyvinyl Alcohol-Povidone (CLEAR EYES ALL SEASONS) 5-6 MG/ML SOLN Apply 1 drop to eye every morning.     Historical Provider, MD  predniSONE (DELTASONE) 5 MG tablet Take 5 mg by mouth daily with breakfast.    Historical Provider, MD  raloxifene (EVISTA) 60 MG tablet Take 60 mg by mouth every morning.    Historical Provider, MD  tamoxifen (NOLVADEX) 20 MG tablet TAKE 1 TABLET EVERY DAY 01/04/16   Patrici Ranks, MD  traMADol (ULTRAM) 50 MG tablet Take 1 tablet (50 mg total) by mouth every 6 (six) hours as needed (pain). 05/10/15   Rolm Bookbinder, MD   BP 89/56 mmHg  Pulse 108  Temp(Src) 100.3 F (37.9 C) (Oral)  Resp 24  Ht 5\' 3"  (1.6 m)  Wt 106 lb (48.081 kg)  BMI 18.78 kg/m2  SpO2  97% Physical Exam  Constitutional: She appears well-developed and well-nourished. No distress.  Frail appearing, but alert and non-toxic.  HENT:  Head: Normocephalic and atraumatic.  Eyes: Conjunctivae and EOM are normal. Pupils are equal, round, and reactive to light.  Neck: Normal range of motion. Neck supple. No JVD present. No tracheal deviation present.  Cardiovascular: Normal rate, regular rhythm and normal heart sounds.   No murmur heard. Pulmonary/Chest: Effort normal and breath sounds normal. She has no wheezes. She has no rales. She exhibits no tenderness.  Abdominal: Soft. Bowel sounds are normal. She exhibits no distension and no mass. There is  no tenderness.  Musculoskeletal: Normal range of motion. She exhibits no edema.  Lymphadenopathy:    She has no cervical adenopathy.  Neurological: She is alert. No cranial nerve deficit. She exhibits normal muscle tone. Coordination normal.  Oriented to person and place, but not time.  Skin: Skin is warm and dry. No rash noted.  Psychiatric: She has a normal mood and affect. Her behavior is normal.  Nursing note and vitals reviewed.   ED Course  Procedures (including critical care time)  DIAGNOSTIC STUDIES: Oxygen Saturation is 97% on RA, normal by my interpretation.    COORDINATION OF CARE: 11:07 PM Discussed treatment plan with pt and her daughters at bedside and they agreed to plan.  Procedure: Femoral vein puncture Indication: Need to obtain blood for labs Ultrasound localization: Not done Prior to procedure, a timeout was performed Description: After topical antisepsis with chlorhexidine was obtained, the right femoral vein was punctured with 21-gauge needle and 20 mL of blood withdrawn. Localization was done via landmarks. Patient tolerated procedure well without complications.  Labs Review Labs Reviewed  CULTURE, BLOOD (ROUTINE X 2)  CULTURE, BLOOD (ROUTINE X 2)  URINE CULTURE  COMPREHENSIVE METABOLIC PANEL  CBC  WITH DIFFERENTIAL/PLATELET  URINALYSIS, ROUTINE W REFLEX MICROSCOPIC (NOT AT [facility])  I-STAT CG4 LACTIC ACID, ED    Imaging Review Dg Chest Port 1 View  04/12/2016  CLINICAL DATA:  Acute onset of altered mental status. Fever and cloudy urine. Initial encounter. EXAM: PORTABLE CHEST 1 VIEW COMPARISON:  Chest radiograph from 03/14/2016 FINDINGS: The lungs are mildly hypoexpanded. Mild bibasilar opacities may reflect atelectasis or possibly mild pneumonia. No pleural effusion or pneumothorax is seen. The cardiomediastinal silhouette remains normal in size. No acute osseous abnormalities are identified. There is partial obscuration of the left lung apex from the patient's head. IMPRESSION: Lungs mildly hypoexpanded. Mild bibasilar opacities may reflect atelectasis or possibly mild pneumonia. Electronically Signed   By: Garald Balding M.D.   On: 04/12/2016 00:16   I have personally reviewed and evaluated these images and lab results as part of my medical decision-making.   EKG Interpretation   Date/Time:  Friday April 11 2016 23:12:46 EDT Ventricular Rate:  104 PR Interval:    QRS Duration: 87 QT Interval:  347 QTC Calculation: 457 R Axis:   -4 Text Interpretation:  Sinus tachycardia Abnormal R-wave progression, early  transition Left ventricular hypertrophy Anterior ST elevation, probably  due to LVH When compared with ECG of 03/10/2016, T wave amplitude has  decreased Confirmed by [facility]  MD, Jonpaul Lumm ([id number]) on 04/11/2016 11:15:34 PM      CRITICAL CARE Performed by: [id number] Total critical care time: 40 minutes Critical care time was exclusive of separately billable procedures and treating other patients. Critical care was necessary to treat or prevent imminent or life-threatening deterioration. Critical care was time spent personally by me on the following activities: development of treatment plan with patient and/or surrogate as well as nursing, discussions with consultants, evaluation of  patient's response to treatment, examination of patient, obtaining history from patient or surrogate, ordering and performing treatments and interventions, ordering and review of laboratory studies, ordering and review of radiographic studies, pulse oximetry and re-evaluation of patient's condition.  MDM   Final diagnoses:  Urinary tract infection without hematuria, site unspecified  Normochromic normocytic anemia    Fever and hypertension in patient with recent hospitalization for urosepsis. Old records were reviewed confirming hospitalization about one month ago for urinary tract infection with sepsis. She has been  started on ciprofloxacin and then switched to trimethoprim-sulfamethoxazole. However, given recent hospitalization, will need to more potent antibiotics. Because of fever, hypotension, tachycardia code sepsis is initiated and she is given early goal-directed fluid therapy. She is started on cefepime for healthcare associated urinary tract infection.  Laboratory workup confirms urinary tract infection. Chronic anemia as it noted and unchanged from baseline. Initial lactic acid level is normal, but it was not drawn until after she had completed her early goal-directed fluid bolus. Case is discussed with Dr. Inda Merlin of [facility] who agrees to admit the patient under observation status.  I personally performed the services described in this documentation, which was scribed in my presence. The recorded information has been reviewed and is accurate.     Delora Fuel, MD [phone]

## 2016-04-12 ENCOUNTER — Observation Stay (HOSPITAL_COMMUNITY): Payer: Medicare Other

## 2016-04-12 ENCOUNTER — Encounter (HOSPITAL_COMMUNITY): Payer: Self-pay | Admitting: Internal Medicine

## 2016-04-12 DIAGNOSIS — K219 Gastro-esophageal reflux disease without esophagitis: Secondary | ICD-10-CM | POA: Diagnosis present

## 2016-04-12 DIAGNOSIS — N342 Other urethritis: Secondary | ICD-10-CM | POA: Diagnosis not present

## 2016-04-12 DIAGNOSIS — Z825 Family history of asthma and other chronic lower respiratory diseases: Secondary | ICD-10-CM | POA: Diagnosis not present

## 2016-04-12 DIAGNOSIS — Y95 Nosocomial condition: Secondary | ICD-10-CM | POA: Diagnosis present

## 2016-04-12 DIAGNOSIS — D649 Anemia, unspecified: Secondary | ICD-10-CM | POA: Diagnosis present

## 2016-04-12 DIAGNOSIS — E1121 Type 2 diabetes mellitus with diabetic nephropathy: Secondary | ICD-10-CM | POA: Diagnosis not present

## 2016-04-12 DIAGNOSIS — Z681 Body mass index (BMI) 19 or less, adult: Secondary | ICD-10-CM | POA: Diagnosis not present

## 2016-04-12 DIAGNOSIS — N3001 Acute cystitis with hematuria: Secondary | ICD-10-CM | POA: Diagnosis not present

## 2016-04-12 DIAGNOSIS — A419 Sepsis, unspecified organism: Secondary | ICD-10-CM | POA: Diagnosis present

## 2016-04-12 DIAGNOSIS — Z87440 Personal history of urinary (tract) infections: Secondary | ICD-10-CM | POA: Diagnosis not present

## 2016-04-12 DIAGNOSIS — N390 Urinary tract infection, site not specified: Secondary | ICD-10-CM | POA: Diagnosis present

## 2016-04-12 DIAGNOSIS — E440 Moderate protein-calorie malnutrition: Secondary | ICD-10-CM | POA: Diagnosis present

## 2016-04-12 DIAGNOSIS — J189 Pneumonia, unspecified organism: Secondary | ICD-10-CM | POA: Diagnosis present

## 2016-04-12 DIAGNOSIS — E119 Type 2 diabetes mellitus without complications: Secondary | ICD-10-CM | POA: Diagnosis not present

## 2016-04-12 DIAGNOSIS — Z7952 Long term (current) use of systemic steroids: Secondary | ICD-10-CM | POA: Diagnosis not present

## 2016-04-12 DIAGNOSIS — Z7982 Long term (current) use of aspirin: Secondary | ICD-10-CM | POA: Diagnosis not present

## 2016-04-12 DIAGNOSIS — Z79810 Long term (current) use of selective estrogen receptor modulators (SERMs): Secondary | ICD-10-CM | POA: Diagnosis not present

## 2016-04-12 DIAGNOSIS — R509 Fever, unspecified: Secondary | ICD-10-CM | POA: Diagnosis present

## 2016-04-12 DIAGNOSIS — N10 Acute tubulo-interstitial nephritis: Secondary | ICD-10-CM

## 2016-04-12 DIAGNOSIS — J45909 Unspecified asthma, uncomplicated: Secondary | ICD-10-CM | POA: Diagnosis present

## 2016-04-12 DIAGNOSIS — Z7984 Long term (current) use of oral hypoglycemic drugs: Secondary | ICD-10-CM | POA: Diagnosis not present

## 2016-04-12 DIAGNOSIS — K210 Gastro-esophageal reflux disease with esophagitis: Secondary | ICD-10-CM

## 2016-04-12 DIAGNOSIS — C50412 Malignant neoplasm of upper-outer quadrant of left female breast: Secondary | ICD-10-CM | POA: Diagnosis present

## 2016-04-12 DIAGNOSIS — F0390 Unspecified dementia without behavioral disturbance: Secondary | ICD-10-CM | POA: Diagnosis present

## 2016-04-12 DIAGNOSIS — Z9012 Acquired absence of left breast and nipple: Secondary | ICD-10-CM | POA: Diagnosis not present

## 2016-04-12 DIAGNOSIS — Z7951 Long term (current) use of inhaled steroids: Secondary | ICD-10-CM | POA: Diagnosis not present

## 2016-04-12 DIAGNOSIS — I10 Essential (primary) hypertension: Secondary | ICD-10-CM | POA: Diagnosis present

## 2016-04-12 DIAGNOSIS — J4520 Mild intermittent asthma, uncomplicated: Secondary | ICD-10-CM | POA: Diagnosis not present

## 2016-04-12 LAB — CBC WITH DIFFERENTIAL/PLATELET
Basophils Absolute: 0 10*3/uL (ref 0.0–0.1)
Basophils Relative: 0 %
EOS ABS: 0 10*3/uL (ref 0.0–0.7)
EOS PCT: 0 %
HCT: 29.3 % — ABNORMAL LOW (ref 36.0–46.0)
Hemoglobin: 9.3 g/dL — ABNORMAL LOW (ref 12.0–15.0)
LYMPHS ABS: 1.4 10*3/uL (ref 0.7–4.0)
Lymphocytes Relative: 9 %
MCH: 29.2 pg (ref 26.0–34.0)
MCHC: 31.7 g/dL (ref 30.0–36.0)
MCV: 91.8 fL (ref 78.0–100.0)
MONO ABS: 0.4 10*3/uL (ref 0.1–1.0)
MONOS PCT: 3 %
Neutro Abs: 12.8 10*3/uL — ABNORMAL HIGH (ref 1.7–7.7)
Neutrophils Relative %: 88 %
PLATELETS: 174 10*3/uL (ref 150–400)
RBC: 3.19 MIL/uL — AB (ref 3.87–5.11)
RDW: 13.7 % (ref 11.5–15.5)
WBC: 14.6 10*3/uL — AB (ref 4.0–10.5)

## 2016-04-12 LAB — COMPREHENSIVE METABOLIC PANEL
ALBUMIN: 2.2 g/dL — AB (ref 3.5–5.0)
ALT: 14 U/L (ref 14–54)
AST: 16 U/L (ref 15–41)
Alkaline Phosphatase: 55 U/L (ref 38–126)
Anion gap: 1 — ABNORMAL LOW (ref 5–15)
BUN: 19 mg/dL (ref 6–20)
CHLORIDE: 108 mmol/L (ref 101–111)
CO2: 27 mmol/L (ref 22–32)
CREATININE: 0.87 mg/dL (ref 0.44–1.00)
Calcium: 7.5 mg/dL — ABNORMAL LOW (ref 8.9–10.3)
GFR calc Af Amer: >60 mL/min (ref >60)
GFR, EST NON AFRICAN AMERICAN: 58 mL/min — AB (ref >60)
GLUCOSE: 238 mg/dL — AB (ref 65–99)
Potassium: 3.9 mmol/L (ref 3.5–5.1)
Sodium: 136 mmol/L (ref 135–145)
Total Bilirubin: 0.3 mg/dL (ref 0.3–1.2)
Total Protein: 4.8 g/dL — ABNORMAL LOW (ref 6.5–8.1)

## 2016-04-12 LAB — BASIC METABOLIC PANEL
ANION GAP: 4 — AB (ref 5–15)
BUN: 16 mg/dL (ref 6–20)
CALCIUM: 7.7 mg/dL — AB (ref 8.9–10.3)
CO2: 27 mmol/L (ref 22–32)
Chloride: 105 mmol/L (ref 101–111)
Creatinine, Ser: 0.89 mg/dL (ref 0.44–1.00)
GFR calc Af Amer: >60 mL/min (ref >60)
GFR, EST NON AFRICAN AMERICAN: 56 mL/min — AB (ref >60)
GLUCOSE: 179 mg/dL — AB (ref 65–99)
POTASSIUM: 3.9 mmol/L (ref 3.5–5.1)
Sodium: 136 mmol/L (ref 135–145)

## 2016-04-12 LAB — URINE MICROSCOPIC-ADD ON

## 2016-04-12 LAB — URINALYSIS, ROUTINE W REFLEX MICROSCOPIC
Glucose, UA: >1000 mg/dL — AB
Ketones, ur: NEGATIVE mg/dL
Nitrite: POSITIVE — AB
PH: 5 (ref 5.0–8.0)
Protein, ur: 100 mg/dL — AB
SPECIFIC GRAVITY, URINE: 1.02 (ref 1.005–1.030)

## 2016-04-12 LAB — GLUCOSE, CAPILLARY
GLUCOSE-CAPILLARY: 186 mg/dL — AB (ref 65–99)
GLUCOSE-CAPILLARY: 176 mg/dL — AB (ref 65–99)
Glucose-Capillary: 313 mg/dL — ABNORMAL HIGH (ref 65–99)
Glucose-Capillary: 128 mg/dL — ABNORMAL HIGH (ref 65–99)

## 2016-04-12 LAB — CBC
HEMATOCRIT: 30.9 % — AB (ref 36.0–46.0)
HEMOGLOBIN: 9.6 g/dL — AB (ref 12.0–15.0)
MCH: 28.8 pg (ref 26.0–34.0)
MCHC: 31.1 g/dL (ref 30.0–36.0)
MCV: 92.8 fL (ref 78.0–100.0)
Platelets: 185 10*3/uL (ref 150–400)
RBC: 3.33 MIL/uL — AB (ref 3.87–5.11)
RDW: 13.7 % (ref 11.5–15.5)
WBC: 12.7 10*3/uL — ABNORMAL HIGH (ref 4.0–10.5)

## 2016-04-12 LAB — TROPONIN I: Troponin I: <0.03 ng/mL (ref <0.03)

## 2016-04-12 LAB — I-STAT CG4 LACTIC ACID, ED: LACTIC ACID, VENOUS: 0.76 mmol/L (ref 0.5–1.9)

## 2016-04-12 MED ORDER — MAGNESIUM OXIDE 400 (241.3 MG) MG PO TABS
200.0000 mg | ORAL_TABLET | Freq: Every day | ORAL | Status: DC
  Administered 2016-04-12 – 2016-04-15 (×4): 200 mg via ORAL
  Filled 2016-04-12 (×4): qty 1

## 2016-04-12 MED ORDER — ACETAMINOPHEN 650 MG RE SUPP: 650.0000 mg | Freq: Four times a day (QID) | RECTAL | Status: DC | PRN

## 2016-04-12 MED ORDER — LEVALBUTEROL HCL 1.25 MG/0.5ML IN NEBU
1.2500 mg | INHALATION_SOLUTION | Freq: Two times a day (BID) | RESPIRATORY_TRACT | Status: DC
Start: 2016-04-12 — End: 2016-04-15
  Administered 2016-04-12 – 2016-04-15 (×5): 1.25 mg via RESPIRATORY_TRACT
  Filled 2016-04-12 (×4): qty 0.5

## 2016-04-12 MED ORDER — SENNA 8.6 MG PO TABS
1.0000 | ORAL_TABLET | Freq: Two times a day (BID) | ORAL | Status: DC
  Administered 2016-04-12 – 2016-04-14 (×6): 8.6 mg via ORAL
  Filled 2016-04-12 (×7): qty 1

## 2016-04-12 MED ORDER — VANCOMYCIN HCL IN DEXTROSE 1-5 GM/200ML-% IV SOLN
1000.0000 mg | Freq: Once | INTRAVENOUS | Status: AC
  Administered 2016-04-12: 1000 mg via INTRAVENOUS
  Filled 2016-04-12: qty 200

## 2016-04-12 MED ORDER — VANCOMYCIN HCL IN DEXTROSE 750-5 MG/150ML-% IV SOLN
750.0000 mg | INTRAVENOUS | Status: DC
  Administered 2016-04-13: 750 mg via INTRAVENOUS
  Filled 2016-04-12 (×3): qty 150

## 2016-04-12 MED ORDER — TAMOXIFEN CITRATE 10 MG PO TABS
20.0000 mg | ORAL_TABLET | Freq: Every day | ORAL | Status: DC
  Administered 2016-04-12 – 2016-04-15 (×4): 20 mg via ORAL
  Filled 2016-04-12 (×4): qty 2

## 2016-04-12 MED ORDER — ATORVASTATIN CALCIUM 20 MG PO TABS
20.0000 mg | ORAL_TABLET | Freq: Every day | ORAL | Status: DC
Start: 2016-04-12 — End: 2016-04-15
  Administered 2016-04-12 – 2016-04-14 (×3): 20 mg via ORAL
  Filled 2016-04-12 (×3): qty 1

## 2016-04-12 MED ORDER — ACETAMINOPHEN 325 MG PO TABS
650.0000 mg | ORAL_TABLET | Freq: Four times a day (QID) | ORAL | Status: DC | PRN
  Administered 2016-04-12: 650 mg via ORAL
  Filled 2016-04-12: qty 2

## 2016-04-12 MED ORDER — ONDANSETRON HCL 4 MG/2ML IJ SOLN: 4.0000 mg | Freq: Four times a day (QID) | INTRAMUSCULAR | Status: DC | PRN

## 2016-04-12 MED ORDER — ONDANSETRON HCL 4 MG PO TABS: 4.0000 mg | ORAL_TABLET | Freq: Four times a day (QID) | ORAL | Status: DC | PRN

## 2016-04-12 MED ORDER — ENOXAPARIN SODIUM 40 MG/0.4ML ~~LOC~~ SOLN
40.0000 mg | SUBCUTANEOUS | Status: DC
  Administered 2016-04-12 – 2016-04-15 (×4): 40 mg via SUBCUTANEOUS
  Filled 2016-04-12 (×4): qty 0.4

## 2016-04-12 MED ORDER — FERROUS SULFATE 325 (65 FE) MG PO TABS
325.0000 mg | ORAL_TABLET | Freq: Every day | ORAL | Status: DC
  Administered 2016-04-12 – 2016-04-15 (×4): 325 mg via ORAL
  Filled 2016-04-12 (×3): qty 1

## 2016-04-12 MED ORDER — RALOXIFENE HCL 60 MG PO TABS
60.0000 mg | ORAL_TABLET | Freq: Every morning | ORAL | Status: DC
  Administered 2016-04-12 – 2016-04-15 (×4): 60 mg via ORAL
  Filled 2016-04-12 (×4): qty 1

## 2016-04-12 MED ORDER — DEXTROSE 5 % IV SOLN
1.0000 g | INTRAVENOUS | Status: DC
  Administered 2016-04-12 – 2016-04-13 (×2): 1 g via INTRAVENOUS
  Filled 2016-04-12 (×3): qty 1

## 2016-04-12 MED ORDER — LEVALBUTEROL HCL 1.25 MG/0.5ML IN NEBU
1.2500 mg | INHALATION_SOLUTION | Freq: Four times a day (QID) | RESPIRATORY_TRACT | Status: DC | PRN
  Administered 2016-04-12: 1.25 mg via RESPIRATORY_TRACT
  Filled 2016-04-12: qty 0.5

## 2016-04-12 MED ORDER — ASPIRIN EC 81 MG PO TBEC
81.0000 mg | DELAYED_RELEASE_TABLET | Freq: Every day | ORAL | Status: DC
  Administered 2016-04-12 – 2016-04-15 (×4): 81 mg via ORAL
  Filled 2016-04-12 (×4): qty 1

## 2016-04-12 MED ORDER — PREDNISONE 10 MG PO TABS
5.0000 mg | ORAL_TABLET | Freq: Every day | ORAL | Status: DC
  Administered 2016-04-12 – 2016-04-15 (×4): 5 mg via ORAL
  Filled 2016-04-12 (×4): qty 1

## 2016-04-12 MED ORDER — LACTINEX PO CHEW
1.0000 | CHEWABLE_TABLET | Freq: Every day | ORAL | Status: DC
  Administered 2016-04-13 – 2016-04-15 (×3): 1 via ORAL
  Filled 2016-04-12 (×5): qty 1

## 2016-04-12 MED ORDER — MOMETASONE FURO-FORMOTEROL FUM 200-5 MCG/ACT IN AERO
2.0000 | INHALATION_SPRAY | Freq: Two times a day (BID) | RESPIRATORY_TRACT | Status: DC
  Administered 2016-04-12 – 2016-04-15 (×7): 2 via RESPIRATORY_TRACT
  Filled 2016-04-12: qty 8.8

## 2016-04-12 MED ORDER — SODIUM CHLORIDE 0.9 % IV BOLUS (SEPSIS)
500.0000 mL | Freq: Once | INTRAVENOUS | Status: AC
  Administered 2016-04-12: 500 mL via INTRAVENOUS

## 2016-04-12 MED ORDER — DOCUSATE SODIUM 100 MG PO CAPS
100.0000 mg | ORAL_CAPSULE | Freq: Two times a day (BID) | ORAL | Status: DC
  Administered 2016-04-12 – 2016-04-15 (×7): 100 mg via ORAL
  Filled 2016-04-12 (×7): qty 1

## 2016-04-12 MED ORDER — SODIUM CHLORIDE 0.9 % IV SOLN
INTRAVENOUS | Status: DC
  Administered 2016-04-12 – 2016-04-14 (×4): via INTRAVENOUS

## 2016-04-12 MED ORDER — MAGNESIUM 200 MG PO TABS
ORAL_TABLET | Freq: Every day | ORAL | Status: DC
  Filled 2016-04-12 (×3): qty 2

## 2016-04-12 MED ORDER — INSULIN ASPART 100 UNIT/ML ~~LOC~~ SOLN
0.0000 [IU] | Freq: Three times a day (TID) | SUBCUTANEOUS | Status: DC
  Administered 2016-04-12: 2 [IU] via SUBCUTANEOUS
  Administered 2016-04-12: 7 [IU] via SUBCUTANEOUS
  Administered 2016-04-12 – 2016-04-13 (×2): 2 [IU] via SUBCUTANEOUS
  Administered 2016-04-13: 1 [IU] via SUBCUTANEOUS
  Administered 2016-04-13 – 2016-04-14 (×2): 2 [IU] via SUBCUTANEOUS
  Administered 2016-04-14 – 2016-04-15 (×2): 1 [IU] via SUBCUTANEOUS

## 2016-04-12 MED ORDER — METOPROLOL SUCCINATE ER 25 MG PO TB24
25.0000 mg | ORAL_TABLET | Freq: Every day | ORAL | Status: DC
Start: 2016-04-12 — End: 2016-04-12
  Filled 2016-04-12: qty 1

## 2016-04-12 MED ORDER — NAPHAZOLINE-GLYCERIN 0.012-0.2 % OP SOLN
1.0000 [drp] | Freq: Every morning | OPHTHALMIC | Status: DC
  Administered 2016-04-12 – 2016-04-15 (×4): 1 [drp] via OPHTHALMIC
  Filled 2016-04-12: qty 15

## 2016-04-12 MED ORDER — PANTOPRAZOLE SODIUM 40 MG PO TBEC
40.0000 mg | DELAYED_RELEASE_TABLET | Freq: Every day | ORAL | Status: DC
  Administered 2016-04-12 – 2016-04-13 (×2): 40 mg via ORAL
  Filled 2016-04-12 (×3): qty 1

## 2016-04-12 NOTE — Progress Notes (Addendum)
Pharmacy Antibiotic Note  [name] is a 82 y.o. female admitted on 04/11/2016 with UTI and possible pneumonia..  Pharmacy has been consulted for Cefepime and Vancomycin dosing.  Plan: Cefepime 2 GM IV given in ED Cefepime 1 GM IV every 24 hours Vancomycin 1 GM IV loading dose, then Vancomycin 750 mg IV every 24 hours Vancomycin trough at steady state Monitor renal function Labs per protocol  Height: 5\' 3"  (160 cm) Weight: 106 lb (48.081 kg) IBW/kg (Calculated) : 52.4  Temp (24hrs), Avg:100.3 F (37.9 C), Min:99 F (37.2 C), Max:101.5 F (38.6 C)   Recent Labs Lab 04/12/16 0116 04/12/16 0123 04/12/16 0648  WBC 14.6*  --  12.7*  CREATININE 0.87  --  0.89  LATICACIDVEN  --  0.76  --     Estimated Creatinine Clearance: 33.2 mL/min (by C-G formula based on Cr of 0.89).    Allergies  Allergen Reactions  . Aspirin Other (See Comments)    Cannot take uncoated due to stomach ulcers    Antimicrobials this admission: Cefepime 7/7 >>  Vancomycin 7/8 >>   Dose adjustments this admission: None    Thank you for allowing pharmacy to be a part of this patient's care.  Chriss Czar 04/12/2016 8:37 AM

## 2016-04-12 NOTE — H&P (Signed)
History and Physical    [name] [id number] DOB: [birth year] DOA: 04/11/2016  PCP: Moshe Cipro, MD Consultants:  Alroy Dust - Cardiologist; [place] - pulmonology Patient coming from: home (with daughter)  Chief Complaint: fever  HPI: [name] is a 82 y.o. female with medical history significant of remote breast cancer and recent (03/08/16) admission for UTI.  Her family reports that she developed fever today to 103.4 - improved with Tylenol prior to ER presentation.  Urine looked very milky.  She was disoriented a little.  Tends to get confused when she has a UTI.     Not complaining of urinary symptoms.  Milky urine is not new but other symptoms were new today.  She did complain of abdominal pain after dinner.  Urine culture from 6/3 grew Klebsiella Pneumoniae resistant to Ampicillin and Nitrofurantoin.  Was discharged on Keflex. Saw her PCM on 6/19; it appears that she may have been given Azithromycin.  Urine not clearing up and so family called physician yesterday and he called in Bactrim - has only taken one pill  since it was prescribed.      ED Course: Fever, tachycardia and hypotension concerning for urosepsis.  Given early goal-directed fluid therapy and started on Cefepime for healthcare-associated UTI.  Normal lactate.  Review of Systems: As per HPI; otherwise 10 point review of systems reviewed and negative.     Past Medical History  Diagnosis Date  . Asthma   . Hypertension   . Diabetes mellitus without complication ([place])   . Acid reflux   . Kyphosis   . Gastric ulcer   . Raynaud disease   . Anemia   . Arthritis     ra  . Cancer [facility])     breast cancer    Past Surgical History  Procedure Laterality Date  . Hernia repair    . Breast surgery    . Mastectomy Right 1978  . Simple mastectomy with axillary sentinel node biopsy Left 05/09/2015    Procedure: LEFT SIMPLE MASTECTOMY;  Surgeon: Erroll Luna, MD;  Location: [place] OR;  Service: General;  Laterality:  Left;    Social History   Social History  . Marital Status: Widowed    Spouse Name: N/A  . Number of Children: N/A  . Years of Education: N/A   Occupational History  . Not on file.   Social History Main Topics  . Smoking status: Never Smoker   . Smokeless tobacco: Never Used  . Alcohol Use: No  . Drug Use: No  . Sexual Activity: No   Other Topics Concern  . Not on file   Social History Narrative    Allergies  Allergen Reactions  . Aspirin Other (See Comments)    Cannot take uncoated due to stomach ulcers    Family History  Problem Relation Age of Onset  . Asthma Sister   . Rheum arthritis Daughter     Prior to Admission medications   Medication Sig Start Date End Date Taking? Authorizing Provider  albuterol (PROVENTIL HFA) 108 (90 BASE) MCG/ACT inhaler Inhale 2 puffs into the lungs every 6 (six) hours as needed for shortness of breath.    Yes Historical Provider, MD  albuterol (PROVENTIL) (2.5 MG/3ML) 0.083% nebulizer solution Take 2.5 mg by nebulization daily. With Ipratropium as needed for shortness of breath   Yes Historical Provider, MD  aspirin EC 81 MG tablet Take 81 mg by mouth daily.    Yes Historical Provider, MD  atorvastatin (LIPITOR) 20 MG tablet Take  20 mg by mouth at bedtime.   Yes Historical Provider, MD  budesonide-formoterol (SYMBICORT) 160-4.5 MCG/ACT inhaler Inhale 2 puffs into the lungs 2 (two) times daily.   Yes Historical Provider, MD  Chlorpheniramine-Acetaminophen (CORICIDIN HBP COLD/FLU PO) Take 1 capsule by mouth every 6 (six) hours as needed (for cold symptoms).    Yes Historical Provider, MD  Cholecalciferol (VITAMIN D3) 1000 UNITS CAPS Take 2,000 Units by mouth daily.    Yes Historical Provider, MD  ciclopirox (PENLAC) 8 % solution APPLY TO FUNGAL TOENAILS EVERY DAY 02/11/16  Yes Historical Provider, MD  ferrous sulfate 325 (65 FE) MG tablet Take 325 mg by mouth daily. 07/23/15  Yes Historical Provider, MD  ipratropium (ATROVENT) 0.02 %  nebulizer solution Take 500 mcg by nebulization daily. With albuterol as needed for shortness of breath   Yes Historical Provider, MD  lactobacillus acidophilus & bulgar (LACTINEX) chewable tablet CHEW 1 TABLET BY MOUTH EVERY DAY 09/17/15  Yes Historical Provider, MD  linagliptin (TRADJENTA) 5 MG TABS tablet Take 5 mg by mouth every morning.   Yes Historical Provider, MD  Magnesium ([employer] TRIPLE MAGNESIUM COMPLEX) 400 MG CAPS Take 1 capsule by mouth daily.   Yes Historical Provider, MD  metFORMIN (GLUCOPHAGE) 500 MG tablet Take 500-1,000 mg by mouth 2 (two) times daily. 1 in the morning and 2 at bedtime   Yes Historical Provider, MD  metoprolol succinate (TOPROL XL) 25 MG 24 hr tablet Take 1 tablet (25 mg total) by mouth daily. 10/12/15  Yes Nita Sells, MD  Multiple Vitamins-Minerals (CENTRUM SILVER ADULT 50+ PO) Take 1 tablet by mouth every morning.    Yes Historical Provider, MD  Omega-3 Fatty Acids (FISH OIL) 1200 MG CAPS Take 1 capsule by mouth daily.   Yes Historical Provider, MD  pantoprazole (PROTONIX) 40 MG tablet Take 40 mg by mouth daily.   Yes Historical Provider, MD  Polyvinyl Alcohol-Povidone (CLEAR EYES ALL SEASONS) 5-6 MG/ML SOLN Apply 1 drop to eye every morning.    Yes Historical Provider, MD  predniSONE (DELTASONE) 5 MG tablet Take 5 mg by mouth daily with breakfast.   Yes Historical Provider, MD  raloxifene (EVISTA) 60 MG tablet Take 60 mg by mouth every morning.   Yes Historical Provider, MD  sulfamethoxazole-trimethoprim (BACTRIM DS,SEPTRA DS) 800-160 MG tablet Take 1 tablet by mouth 2 (two) times daily. 7 day course starting on 04/11/2016 04/10/16  Yes Historical Provider, MD  tamoxifen (NOLVADEX) 20 MG tablet TAKE 1 TABLET EVERY DAY 01/04/16  Yes Patrici Ranks, MD  traMADol (ULTRAM) 50 MG tablet Take 1 tablet (50 mg total) by mouth every 6 (six) hours as needed (pain). 05/10/15  Yes Rolm Bookbinder, MD  azithromycin (ZITHROMAX) 250 MG tablet One tablet daily for 4 more days  starting Saturday evening. Patient not taking: Reported on 04/11/2016 03/14/16   Nat Christen, MD  cephALEXin (KEFLEX) 500 MG capsule Take 1 capsule (500 mg total) by mouth 3 (three) times daily. Patient not taking: Reported on 04/11/2016 03/09/16   Erline Hau, MD    Physical Exam: Filed Vitals:   04/12/16 0102 04/12/16 0200 04/12/16 0235 04/12/16 0331  BP: 100/57 100/52 109/67 122/32  Pulse: 101 102 103 97  Temp:    99 F (37.2 C)  TempSrc:    Oral  Resp: 20 20 24 22   Height:      Weight:      SpO2: 96% 96% 95% 100%     General:  Appears calm and comfortable  and is NAD; she is not confused and is able to answer questions appropriately Eyes:  PERRL, EOMI, normal lids, iris ENT:  grossly normal hearing, lips & tongue, mmm Neck:  no LAD, masses or thyromegaly Cardiovascular:  RRR, no m/r/g. No LE edema.  Respiratory:  CTA bilaterally, no w/r/r. Normal respiratory effort. Abdomen:  soft, ntnd, NABS - reports earlier suprapubic pain but none on exam currently Skin:  no rash or induration seen on limited exam Musculoskeletal:  grossly normal tone BUE/BLE, good ROM, no bony abnormality Psychiatric:  grossly normal mood and affect, speech fluent and appropriate, AOx3 Neurologic:  CN 2-12 grossly intact, moves all extremities in coordinated fashion, sensation intact  Labs on Admission: I have personally reviewed following labs and imaging studies  CBC:  Recent Labs Lab 04/12/16 0116  WBC 14.6*  NEUTROABS 12.8*  HGB 9.3*  HCT 29.3*  MCV 91.8  PLT [id number]   Basic Metabolic Panel:  Recent Labs Lab 04/12/16 0116  NA 136  K 3.9  CL 108  CO2 27  GLUCOSE 238*  BUN 19  CREATININE 0.87  CALCIUM 7.5*   GFR: Estimated Creatinine Clearance: 33.9 mL/min (by C-G formula based on Cr of 0.87). Liver Function Tests:  Recent Labs Lab 04/12/16 0116  AST 16  ALT 14  ALKPHOS 55  BILITOT 0.3  PROT 4.8*  ALBUMIN 2.2*   No results for input(s): LIPASE, AMYLASE in the last  168 hours. No results for input(s): AMMONIA in the last 168 hours. Coagulation Profile: No results for input(s): INR, PROTIME in the last 168 hours. Cardiac Enzymes:  Recent Labs Lab 04/12/16 0116  TROPONINI <0.03   BNP (last 3 results) No results for input(s): PROBNP in the last 8760 hours. HbA1C: No results for input(s): HGBA1C in the last 72 hours. CBG: No results for input(s): GLUCAP in the last 168 hours. Lipid Profile: No results for input(s): CHOL, HDL, LDLCALC, TRIG, CHOLHDL, LDLDIRECT in the last 72 hours. Thyroid Function Tests: No results for input(s): TSH, T4TOTAL, FREET4, T3FREE, THYROIDAB in the last 72 hours. Anemia Panel: No results for input(s): VITAMINB12, FOLATE, FERRITIN, TIBC, IRON, RETICCTPCT in the last 72 hours. Urine analysis:    Component Value Date/Time   COLORURINE YELLOW 04/12/2016 0042   APPEARANCEUR CLOUDY* 04/12/2016 0042   LABSPEC 1.020 04/12/2016 0042   PHURINE 5.0 04/12/2016 0042   GLUCOSEU >1000* 04/12/2016 0042   HGBUR LARGE* 04/12/2016 0042   BILIRUBINUR SMALL* 04/12/2016 0042   KETONESUR NEGATIVE 04/12/2016 0042   PROTEINUR 100* 04/12/2016 0042   UROBILINOGEN 0.2 06/30/2015 1317   NITRITE POSITIVE* 04/12/2016 0042   LEUKOCYTESUR LARGE* 04/12/2016 0042    Creatinine Clearance: Estimated Creatinine Clearance: 33.9 mL/min (by C-G formula based on Cr of 0.87).  Sepsis Labs: @LABRCNTIP (procalcitonin:4,lacticidven:4) ) Recent Results (from the past 240 hour(s))  Blood Culture (routine x 2)     Status: None (Preliminary result)   Collection Time: 04/11/16 11:22 PM  Result Value Ref Range Status   Specimen Description BLOOD RIGHT ARM  Final   Special Requests BOTTLES DRAWN AEROBIC ONLY 6CC  Final   Culture PENDING  Incomplete   Report Status PENDING  Incomplete  Blood Culture (routine x 2)     Status: None (Preliminary result)   Collection Time: 04/11/16 11:37 PM  Result Value Ref Range Status   Specimen Description BLOOD RIGHT  ARM  Final   Special Requests BOTTLES DRAWN AEROBIC ONLY [place]  Final   Culture PENDING  Incomplete   Report Status PENDING  Incomplete     Radiological Exams on Admission: Dg Chest Port 1 View  04/12/2016  CLINICAL DATA:  Acute onset of altered mental status. Fever and cloudy urine. Initial encounter. EXAM: PORTABLE CHEST 1 VIEW COMPARISON:  Chest radiograph from 03/14/2016 FINDINGS: The lungs are mildly hypoexpanded. Mild bibasilar opacities may reflect atelectasis or possibly mild pneumonia. No pleural effusion or pneumothorax is seen. The cardiomediastinal silhouette remains normal in size. No acute osseous abnormalities are identified. There is partial obscuration of the left lung apex from the patient's head. IMPRESSION: Lungs mildly hypoexpanded. Mild bibasilar opacities may reflect atelectasis or possibly mild pneumonia. Electronically Signed   By: Garald Balding M.D.   On: 04/12/2016 00:16    EKG: Independently reviewed.  Sinus tachycardia with rate 104; LVH with no evidence of acute ischemia  Assessment/Plan Principal Problem:   UTI (urinary tract infection) Active Problems:   Diabetes type 2, controlled (HCC)   GERD (gastroesophageal reflux disease)   Breast cancer of upper-outer quadrant of left female breast (HCC)   Asthma, chronic    UTI -Meets criteria for sepsis (fever, elevated WBC count, tachycardia) but normal lactate and procalcitonin, BP generally in normal range for her, and mentating well -Will admit to Med-Surg, Obs status  -Treat UTI with Cefepime as per EDP (pharmacy to dose) -Urine culture pending, blood cultures pending -MIVF  DM -Hold home DM meds -Cover with SSI  Other chronic medical problems -Continue home meds as ordered      DVT prophylaxis: Lovenox Code Status: Full - confirmed with patient/family Family Communication: Daughters present at bedside and in agreement with plan Disposition Plan: Home once clinically improved Consults called:  None Admission status: Observation   Karmen Bongo MD [facility]  If 7PM-7AM, please contact night-coverage [email or web address] Password [facility]  04/12/2016, 4:38 AM

## 2016-04-12 NOTE — ED Notes (Signed)
Per MD Roxanne Mins redraw I-stat lactic three hours from first. Redraw [phone]

## 2016-04-12 NOTE — ED Notes (Signed)
MD notified that blood work is still trying to be collected.

## 2016-04-12 NOTE — Progress Notes (Signed)
Patient seen and examined  82 y.o. female with medical history significant of remote breast cancer and recent (03/08/16) admission for UTI. Her family reports that she developed fever today to 103.4 - improved with Tylenol prior to ER presentation. Urine looked very milky. She was disoriented a little. Tends to get confused when she has a UTI. Not complaining of urinary symptoms. Milky urine is not new but other symptoms were new today. She did complain of abdominal pain after dinner.  Urine culture from 6/3 grew Klebsiella Pneumoniae resistant to Ampicillin and Nitrofurantoin. Was discharged on Keflex. Saw her PCM on 6/19;was prescribed Cipro by her PCP last week and was started on Bactrim DS today  Assessment and plan  UTI/ HCAP -Meets criteria for sepsis (fever, elevated WBC count, tachycardia) but normal lactate and procalcitonin, BP generally in normal range for her, and mentating well Place patient on telemetry,  follow urine culture -Treat UTI with Cefepime as per EDP (pharmacy to dose) Most recent urine culture showed Klebsiella pneumonia Continue aggressive IV fluid resuscitation Will add vancomycin for possible pneumonia and repeat chest x-ray tomorrow  DM -Hold home DM meds -Cover with SSI Repeat hemoglobin A1c  Essential hypertension Hold metoprolol, blood pressure soft  Asthma-stable prn Xopenex as needed

## 2016-04-13 ENCOUNTER — Inpatient Hospital Stay (HOSPITAL_COMMUNITY): Payer: Medicare Other

## 2016-04-13 DIAGNOSIS — N390 Urinary tract infection, site not specified: Secondary | ICD-10-CM

## 2016-04-13 DIAGNOSIS — E1121 Type 2 diabetes mellitus with diabetic nephropathy: Secondary | ICD-10-CM

## 2016-04-13 DIAGNOSIS — J4520 Mild intermittent asthma, uncomplicated: Secondary | ICD-10-CM

## 2016-04-13 DIAGNOSIS — K219 Gastro-esophageal reflux disease without esophagitis: Secondary | ICD-10-CM

## 2016-04-13 LAB — GLUCOSE, CAPILLARY
GLUCOSE-CAPILLARY: 175 mg/dL — AB (ref 65–99)
GLUCOSE-CAPILLARY: 194 mg/dL — AB (ref 65–99)
Glucose-Capillary: 136 mg/dL — ABNORMAL HIGH (ref 65–99)
Glucose-Capillary: 248 mg/dL — ABNORMAL HIGH (ref 65–99)

## 2016-04-13 LAB — CBC
HEMATOCRIT: 31.5 % — AB (ref 36.0–46.0)
Hemoglobin: 9.9 g/dL — ABNORMAL LOW (ref 12.0–15.0)
MCH: 29.2 pg (ref 26.0–34.0)
MCHC: 31.4 g/dL (ref 30.0–36.0)
MCV: 92.9 fL (ref 78.0–100.0)
PLATELETS: 176 10*3/uL (ref 150–400)
RBC: 3.39 MIL/uL — ABNORMAL LOW (ref 3.87–5.11)
RDW: 14.1 % (ref 11.5–15.5)
WBC: 12 10*3/uL — AB (ref 4.0–10.5)

## 2016-04-13 LAB — COMPREHENSIVE METABOLIC PANEL
ALBUMIN: 2.3 g/dL — AB (ref 3.5–5.0)
ALT: 15 U/L (ref 14–54)
ANION GAP: 3 — AB (ref 5–15)
AST: 18 U/L (ref 15–41)
Alkaline Phosphatase: 42 U/L (ref 38–126)
BUN: 10 mg/dL (ref 6–20)
CHLORIDE: 109 mmol/L (ref 101–111)
CO2: 25 mmol/L (ref 22–32)
Calcium: 7.8 mg/dL — ABNORMAL LOW (ref 8.9–10.3)
Creatinine, Ser: 0.69 mg/dL (ref 0.44–1.00)
GFR calc Af Amer: >60 mL/min (ref >60)
GFR calc non Af Amer: >60 mL/min (ref >60)
GLUCOSE: 144 mg/dL — AB (ref 65–99)
POTASSIUM: 4 mmol/L (ref 3.5–5.1)
SODIUM: 137 mmol/L (ref 135–145)
Total Bilirubin: 0.3 mg/dL (ref 0.3–1.2)
Total Protein: 5 g/dL — ABNORMAL LOW (ref 6.5–8.1)

## 2016-04-13 MED ORDER — BENZONATATE 100 MG PO CAPS
100.0000 mg | ORAL_CAPSULE | Freq: Three times a day (TID) | ORAL | Status: DC | PRN
  Administered 2016-04-13 – 2016-04-14 (×3): 100 mg via ORAL
  Filled 2016-04-13 (×3): qty 1

## 2016-04-13 MED ORDER — PANTOPRAZOLE SODIUM 40 MG PO TBEC
40.0000 mg | DELAYED_RELEASE_TABLET | Freq: Every day | ORAL | Status: DC
  Administered 2016-04-14 – 2016-04-15 (×2): 40 mg via ORAL
  Filled 2016-04-13: qty 1

## 2016-04-13 NOTE — Progress Notes (Signed)
Cental telemetry called to report HR @140 . Daughter at bedside and extra company in to see. Pt tearful yet calm. Denied pain or discomfort. Apical HR 102. Charge nurse made aware.

## 2016-04-13 NOTE — Progress Notes (Signed)
Recently spoke with Dr. Allyson Sabal via phone. Md aware coughing frequently today. Has chronic cough and takes tessalon perles at home. See new order received for home med.

## 2016-04-13 NOTE — Progress Notes (Signed)
[facility] PROGRESS NOTE  [name] TMY:[id number] DOB: [birth year] DOA: 04/11/2016   PCP: Moshe Cipro, MD     Assessment/Plan: Principal Problem:   UTI (urinary tract infection) Active Problems:   Diabetes type 2, controlled ([place])   GERD (gastroesophageal reflux disease)   Breast cancer of upper-outer quadrant of left female breast ([place])   Asthma, chronic    82 y.o. female with medical history significant of remote breast cancer and recent (03/08/16) admission for UTI. Her family reports that she developed fever today to 103.4 - improved with Tylenol prior to ER presentation. Urine looked very milky. She was disoriented a little. Tends to get confused when she has a UTI. Not complaining of urinary symptoms. Milky urine is not new but other symptoms were new today. She did complain of abdominal pain after dinner.  Urine culture from 6/3 grew Klebsiella Pneumoniae resistant to Ampicillin and Nitrofurantoin. Was discharged on Keflex. Saw her PCM on 6/19;was prescribed Cipro by her PCP last week and was started on Bactrim DS today  Assessment and plan  UTI/ HCAP-improving Met criteria for sepsis (fever, elevated WBC count, tachycardia)  BP generally in normal range for her, and mentating well Continue telemetry, follow urine culture, recent urine culture from 03/08/16 showed Klebsiella pneumoniae pansensitive -Treat UTI with Cefepime   Repeat chest x-ray, continue vancomycin for suspected healthcare associated pneumonia Continue aggressive IV fluid resuscitation  History of breast cancer-on tamoxifen not a candidate for curative/palliative treatment  DM -Hold home DM meds -Cover with SSI Last hemoglobin A1c 8.4 on 06/30/15  Essential hypertension Hold metoprolol, blood pressure soft  Asthma-stable prn Xopenex as needed   DVT prophylaxsis Lovenox    Family Communication: Discussed in detail with the patient, all imaging results, lab results explained  to the patient   Disposition Plan: PT evaluation, anticipate discharge on Monday    Consultants:  None    Procedures:  None  Antibiotics: Anti-infectives    Start     Dose/Rate Route Frequency Ordered Stop   04/13/16 1000  vancomycin (VANCOCIN) IVPB 750 mg/150 ml premix     750 mg 150 mL/hr over 60 Minutes Intravenous Every 24 hours 04/12/16 0908     04/12/16 2200  ceFEPIme (MAXIPIME) 1 g in dextrose 5 % 50 mL IVPB     1 g 100 mL/hr over 30 Minutes Intravenous Every 24 hours 04/12/16 0836        HPI/Subjective: Patient continues to be minimally conversive, arousable, answering questions in yes and no, daughter feels patient was stable overnight does not have cough, no chest pain or shortness of breath  Objective: Filed Vitals:   04/12/16 2119 04/12/16 2232 04/13/16 0627 04/13/16 0723  BP: 104/41  107/55   Pulse: 93  99   Temp: 98.6 F (37 C)  99 F (37.2 C)   TempSrc: Oral  Oral   Resp: 20  20   Height:      Weight:      SpO2: 100% 96% 98% 98%    Intake/Output Summary (Last 24 hours) at 04/13/16 0837 Last data filed at 04/12/16 1800  Gross per 24 hour  Intake    240 ml  Output      0 ml  Net    240 ml    Exam:  Examination:  General exam: Appears calm and comfortable  Respiratory system: Clear to auscultation. Respiratory effort normal. Cardiovascular system: S1 & S2 heard, RRR. No JVD, murmurs, rubs, gallops or clicks.  No pedal edema. Gastrointestinal system: Abdomen is nondistended, soft and nontender. No organomegaly or masses felt. Normal bowel sounds heard. Central nervous system: Alert and oriented. No focal neurological deficits. Extremities: Symmetric 5 x 5 power. Skin: No rashes, lesions or ulcers Psychiatry: Judgement and insight appear normal. Mood & affect appropriate.     Data Reviewed: I have personally reviewed following labs and imaging studies  Micro Results Recent Results (from the past 240 hour(s))  Blood Culture (routine x  2)     Status: None (Preliminary result)   Collection Time: 04/11/16 11:22 PM  Result Value Ref Range Status   Specimen Description BLOOD RIGHT ARM  Final   Special Requests BOTTLES DRAWN AEROBIC ONLY 6CC  Final   Culture NO GROWTH 2 DAYS  Final   Report Status PENDING  Incomplete  Blood Culture (routine x 2)     Status: None (Preliminary result)   Collection Time: 04/11/16 11:37 PM  Result Value Ref Range Status   Specimen Description BLOOD RIGHT ARM  Final   Special Requests BOTTLES DRAWN AEROBIC ONLY 6CC  Final   Culture NO GROWTH 2 DAYS  Final   Report Status PENDING  Incomplete    Radiology Reports US Renal  04/12/2016  CLINICAL DATA:  82 year old female inpatient with chronic renal failure. EXAM: RENAL / URINARY TRACT ULTRASOUND COMPLETE COMPARISON:  02/19/2013 CT abdomen/ pelvis. FINDINGS: Right Kidney: Length: 8.1 cm. Echogenicity within normal limits. No mass or hydronephrosis visualized. Left Kidney: Left kidney could not be visualized due to extensive overlying bowel gas. Bladder: Diffuse bladder wall thickening and trabeculation. Fluid-debris level in the dependent bladder. Bilateral bladder wall diverticula. No definite bladder mass demonstrated. IMPRESSION: 1. Small right kidney with no right hydronephrosis. 2. Nonvisualization of the left kidney due to extensive overlying bowel gas. Consider further evaluation with CT abdomen/pelvis if there is clinical concern for left urinary tract obstruction. 3. Diffuse bladder wall thickening and trabeculation. Bilateral bladder wall diverticulum. Fluid-debris level in the bladder. Findings suggest chronic bladder voiding dysfunction with layering bladder debris and/or hemorrhage. Recommend correlation with urinalysis to exclude acute cystitis. Electronically Signed   By: Ilona Sorrel M.D.   On: 04/12/2016 11:11   Dg Chest Port 1 View  04/12/2016  CLINICAL DATA:  Acute onset of altered mental status. Fever and cloudy urine. Initial encounter.  EXAM: PORTABLE CHEST 1 VIEW COMPARISON:  Chest radiograph from 03/14/2016 FINDINGS: The lungs are mildly hypoexpanded. Mild bibasilar opacities may reflect atelectasis or possibly mild pneumonia. No pleural effusion or pneumothorax is seen. The cardiomediastinal silhouette remains normal in size. No acute osseous abnormalities are identified. There is partial obscuration of the left lung apex from the patient's head. IMPRESSION: Lungs mildly hypoexpanded. Mild bibasilar opacities may reflect atelectasis or possibly mild pneumonia. Electronically Signed   By: Garald Balding M.D.   On: 04/12/2016 00:16   Dg Chest Port 1 View  03/14/2016  CLINICAL DATA:  Cough EXAM: PORTABLE CHEST 1 VIEW COMPARISON:  03/08/2016 FINDINGS: Cardiomediastinal silhouette is stable. There is elevation of the left hemidiaphragm. Hazy atelectasis or infiltrate in left lower lobe. No convincing pulmonary edema. Right lung is clear. Surgical clips are noted in left axilla. IMPRESSION: Elevation of the left hemidiaphragm. Hazy atelectasis or infiltrate in left lower lobe. Right lung is clear. Electronically Signed   By: Lahoma Crocker M.D.   On: 03/14/2016 21:59     CBC  Recent Labs Lab 04/12/16 0116 04/12/16 0648 04/13/16 0657  WBC 14.6* 12.7* 12.0*  HGB  9.3* 9.6* 9.9*  HCT 29.3* 30.9* 31.5*  PLT 174 185 176  MCV 91.8 92.8 92.9  MCH 29.2 28.8 29.2  MCHC 31.7 31.1 31.4  RDW 13.7 13.7 14.1  LYMPHSABS 1.4  --   --   MONOABS 0.4  --   --   EOSABS 0.0  --   --   BASOSABS 0.0  --   --     Chemistries   Recent Labs Lab 04/12/16 0116 04/12/16 0648 04/13/16 0657  NA 136 136 137  K 3.9 3.9 4.0  CL 108 105 109  CO2 _0 GLUCOSE 238* 179* 144*  BUN _1 CREATININE 0.87 0.89 0.69  CALCIUM 7.5* 7.7* 7.8*  AST 16  --  18  ALT 14  --  15  ALKPHOS 55  --  42  BILITOT 0.3  --  0.3   ------------------------------------------------------------------------------------------------------------------ estimated  creatinine clearance is 36.9 mL/min (by C-G formula based on Cr of 0.69). ------------------------------------------------------------------------------------------------------------------ No results for input(s): HGBA1C in the last 72 hours. ------------------------------------------------------------------------------------------------------------------ No results for input(s): CHOL, HDL, LDLCALC, TRIG, CHOLHDL, LDLDIRECT in the last 72 hours. ------------------------------------------------------------------------------------------------------------------ No results for input(s): TSH, T4TOTAL, T3FREE, THYROIDAB in the last 72 hours.  Invalid input(s): FREET3 ------------------------------------------------------------------------------------------------------------------ No results for input(s): VITAMINB12, FOLATE, FERRITIN, TIBC, IRON, RETICCTPCT in the last 72 hours.  Coagulation profile No results for input(s): INR, PROTIME in the last 168 hours.  No results for input(s): DDIMER in the last 72 hours.  Cardiac Enzymes  Recent Labs Lab 04/12/16 0116  TROPONINI <0.03   ------------------------------------------------------------------------------------------------------------------ Invalid input(s): POCBNP   CBG:  Recent Labs Lab 04/12/16 0732 04/12/16 1123 04/12/16 1615 04/12/16 2051 04/13/16 0719  GLUCAP 186* 176* 313* 128* 136*       Studies: US Renal  04/12/2016  CLINICAL DATA:  82 year old female inpatient with chronic renal failure. EXAM: RENAL / URINARY TRACT ULTRASOUND COMPLETE COMPARISON:  02/19/2013 CT abdomen/ pelvis. FINDINGS: Right Kidney: Length: 8.1 cm. Echogenicity within normal limits. No mass or hydronephrosis visualized. Left Kidney: Left kidney could not be visualized due to extensive overlying bowel gas. Bladder: Diffuse bladder wall thickening and trabeculation. Fluid-debris level in the dependent bladder. Bilateral bladder wall diverticula. No  definite bladder mass demonstrated. IMPRESSION: 1. Small right kidney with no right hydronephrosis. 2. Nonvisualization of the left kidney due to extensive overlying bowel gas. Consider further evaluation with CT abdomen/pelvis if there is clinical concern for left urinary tract obstruction. 3. Diffuse bladder wall thickening and trabeculation. Bilateral bladder wall diverticulum. Fluid-debris level in the bladder. Findings suggest chronic bladder voiding dysfunction with layering bladder debris and/or hemorrhage. Recommend correlation with urinalysis to exclude acute cystitis. Electronically Signed   By: Ilona Sorrel M.D.   On: 04/12/2016 11:11   Dg Chest Port 1 View  04/12/2016  CLINICAL DATA:  Acute onset of altered mental status. Fever and cloudy urine. Initial encounter. EXAM: PORTABLE CHEST 1 VIEW COMPARISON:  Chest radiograph from 03/14/2016 FINDINGS: The lungs are mildly hypoexpanded. Mild bibasilar opacities may reflect atelectasis or possibly mild pneumonia. No pleural effusion or pneumothorax is seen. The cardiomediastinal silhouette remains normal in size. No acute osseous abnormalities are identified. There is partial obscuration of the left lung apex from the patient's head. IMPRESSION: Lungs mildly hypoexpanded. Mild bibasilar opacities may reflect atelectasis or possibly mild pneumonia. Electronically Signed   By: Garald Balding M.D.   On: 04/12/2016 00:16      Lab Results  Component Value Date   HGBA1C 8.4*  06/30/2015   HGBA1C 8.2* 05/01/2015   HGBA1C 8.7* 10/29/2012   Lab Results  Component Value Date   CREATININE 0.69 04/13/2016       Scheduled Meds: . aspirin EC  81 mg Oral Daily  . atorvastatin  20 mg Oral QHS  . ceFEPime (MAXIPIME) IV  1 g Intravenous Q24H  . docusate sodium  100 mg Oral BID  . enoxaparin (LOVENOX) injection  40 mg Subcutaneous Q24H  . ferrous sulfate  325 mg Oral Daily  . insulin aspart  0-9 Units Subcutaneous TID WC  . lactobacillus acidophilus &  bulgar  1 tablet Oral Daily  . levalbuterol  1.25 mg Nebulization BID  . magnesium oxide  200 mg Oral Daily  . mometasone-formoterol  2 puff Inhalation BID  . naphazoline-glycerin  1 drop Both Eyes q morning - 10a  . pantoprazole  40 mg Oral Daily  . predniSONE  5 mg Oral Q breakfast  . raloxifene  60 mg Oral q morning - 10a  . senna  1 tablet Oral BID  . tamoxifen  20 mg Oral Daily  . vancomycin  750 mg Intravenous Q24H   Continuous Infusions: . sodium chloride 75 mL/hr at 04/12/16 1155     LOS: 1 day    Time spent: >30 MINS    [facility]  [facility] Pager [phone]. If 7PM-7AM, please contact night-coverage at [email or web address], password [facility] 04/13/2016, 8:37 AM  LOS: 1 day

## 2016-04-14 DIAGNOSIS — C50412 Malignant neoplasm of upper-outer quadrant of left female breast: Secondary | ICD-10-CM

## 2016-04-14 DIAGNOSIS — N3001 Acute cystitis with hematuria: Secondary | ICD-10-CM

## 2016-04-14 LAB — GLUCOSE, CAPILLARY
GLUCOSE-CAPILLARY: 200 mg/dL — AB (ref 65–99)
GLUCOSE-CAPILLARY: 222 mg/dL — AB (ref 65–99)
Glucose-Capillary: 149 mg/dL — ABNORMAL HIGH (ref 65–99)

## 2016-04-14 LAB — COMPREHENSIVE METABOLIC PANEL
ALBUMIN: 2 g/dL — AB (ref 3.5–5.0)
ALK PHOS: 37 U/L — AB (ref 38–126)
ALT: 13 U/L — AB (ref 14–54)
AST: 14 U/L — AB (ref 15–41)
BUN: 6 mg/dL (ref 6–20)
CALCIUM: 7.8 mg/dL — AB (ref 8.9–10.3)
CO2: 27 mmol/L (ref 22–32)
Chloride: 111 mmol/L (ref 101–111)
Creatinine, Ser: 0.51 mg/dL (ref 0.44–1.00)
GFR calc Af Amer: >60 mL/min (ref >60)
GFR calc non Af Amer: >60 mL/min (ref >60)
GLUCOSE: 157 mg/dL — AB (ref 65–99)
Potassium: 3.5 mmol/L (ref 3.5–5.1)
SODIUM: 138 mmol/L (ref 135–145)
TOTAL PROTEIN: 4.3 g/dL — AB (ref 6.5–8.1)
Total Bilirubin: 0.4 mg/dL (ref 0.3–1.2)

## 2016-04-14 LAB — CBC
HCT: 27 % — ABNORMAL LOW (ref 36.0–46.0)
HEMOGLOBIN: 8.5 g/dL — AB (ref 12.0–15.0)
MCH: 29 pg (ref 26.0–34.0)
MCHC: 31.5 g/dL (ref 30.0–36.0)
MCV: 92.2 fL (ref 78.0–100.0)
Platelets: 169 10*3/uL (ref 150–400)
RBC: 2.93 MIL/uL — AB (ref 3.87–5.11)
RDW: 13.9 % (ref 11.5–15.5)
WBC: 8.8 10*3/uL (ref 4.0–10.5)

## 2016-04-14 LAB — HEMOGLOBIN A1C
HEMOGLOBIN A1C: 7.3 % — AB (ref 4.8–5.6)
MEAN PLASMA GLUCOSE: 163 mg/dL

## 2016-04-14 MED ORDER — AZITHROMYCIN 250 MG PO TABS
500.0000 mg | ORAL_TABLET | Freq: Every day | ORAL | Status: DC
  Administered 2016-04-14 – 2016-04-15 (×2): 500 mg via ORAL
  Filled 2016-04-14 (×2): qty 2

## 2016-04-14 MED ORDER — METOPROLOL TARTRATE 25 MG PO TABS
12.5000 mg | ORAL_TABLET | Freq: Two times a day (BID) | ORAL | Status: DC
  Administered 2016-04-14 – 2016-04-15 (×3): 12.5 mg via ORAL
  Filled 2016-04-14 (×3): qty 1

## 2016-04-14 MED ORDER — CEFDINIR 125 MG/5ML PO SUSR
250.0000 mg | Freq: Two times a day (BID) | ORAL | Status: DC
  Administered 2016-04-14 – 2016-04-15 (×3): 250 mg via ORAL
  Filled 2016-04-14 (×5): qty 10

## 2016-04-14 NOTE — Care Management Important Message (Signed)
Important Message  Patient Details  Name: [name] MRN: [id number] Date of Birth: August 02, 1926   Medicare Important Message Given:  Yes    Sherald Barge, RN 04/14/2016, 12:27 PM

## 2016-04-14 NOTE — Care Management Note (Signed)
Case Management Note  Patient Details  Name: Lashaunte Tumbleson MRN: [id number] Date of Birth: 1926-01-05  Subjective/Objective:                  Pt is from home, lives alone and has children who care for her along with aid 24/7. Pt's family states they are no interested in [facility] services as they assist with PT and home. Pt has all needed DME but family states she needs new BSC and WC, they chosen [facility] from agency list. Romualdo Bolk, made aware, pulled pt info and Medicare paid for both DME <5 years ago. Family aware and opt out of paying OOP for this DME. Pt uses home O2 at night and has this PTA. Family plans to take pt back home with resumption of previous arrangement.   Action/Plan: No CM needs anticipated.   Expected Discharge Date:     04/15/2016             Expected Discharge Plan:  Home/Self Care  In-House Referral:  NA  Discharge planning Services  CM Consult  Post Acute Care Choice:  Durable Medical Equipment Choice offered to:  Adult Children  DME Arranged:  Bedside commode, Wheelchair manual DME Agency:  [place]:    [place] Agency:     Status of Service:  Completed, signed off  If discussed at [place] of Stay Meetings, dates discussed:    Additional Comments:  Sherald Barge, RN 04/14/2016, 12:50 PM

## 2016-04-14 NOTE — Evaluation (Signed)
Clinical/Bedside Swallow Evaluation Patient Details  Name: [name] MRN: [id number] Date of Birth: 1926-06-23  Today's Date: 04/14/2016 Time: SLP Start Time (ACUTE ONLY): [id number] SLP Stop Time (ACUTE ONLY): 1445 SLP Time Calculation (min) (ACUTE ONLY): 14 min  Past Medical History:  Past Medical History  Diagnosis Date  . Asthma   . Hypertension   . Diabetes mellitus without complication ([place])   . Acid reflux   . Kyphosis   . Gastric ulcer   . Raynaud disease   . Anemia   . Arthritis     ra  . Cancer [facility])     breast cancer   Past Surgical History:  Past Surgical History  Procedure Laterality Date  . Hernia repair    . Breast surgery    . Mastectomy Right 1978  . Simple mastectomy with axillary sentinel node biopsy Left 05/09/2015    Procedure: LEFT SIMPLE MASTECTOMY;  Surgeon: Erroll Luna, MD;  Location: [place];  Service: General;  Laterality: Left;   HPI:  Pt is an 82 y.o. female with medical history significant of remote breast cancer and recent (03/08/16) admission for UTI. Her family reports that she developed fever today to 103.4 - improved with Tylenol prior to ER presentation. Urine looked very milky. She was disoriented a little.Chest x ray with significantly improved left lung ventilation since June. Mild opacity at the left base could reflect residual infection or atelectasis.   Assessment / Plan / Recommendation Clinical Impression  Pt presents with mild oropharyngeal dysphagia which appears to be at her baseline swallow function per family report. Note edentulous oral cavity, impacting ability to tolerate solid PO. Pts daughters present who report assisting in providing softer foods and chopping meats at meals.  RN reports pt instance of episodic swallow difficutly which she states was impacted by pt positioning. No overt signs or symptoms of aspiration this date. Educated family and pt regarding recommendation for mechanical soft consistencies, though family  prefers regular diet to which they can cut up and modify as needed. No further ST needs identified. Continue regular thin liquid diet with full supervision and feeding assistance at meals. ST to sign off.      Aspiration Risk  Mild aspiration risk    Diet Recommendation   Regular, thin liquids   Medication Administration: Whole meds with liquid    Other  Recommendations Oral Care Recommendations: Oral care BID   Follow up Recommendations  24 hour supervision/assistance    Frequency and Duration            Prognosis        Swallow Study   General Date of Onset: 04/11/16 HPI: Pt is an 82 y.o. female with medical history significant of remote breast cancer and recent (03/08/16) admission for UTI. Her family reports that she developed fever today to 103.4 - improved with Tylenol prior to ER presentation. Urine looked very milky. She was disoriented a little.Chest x ray with significantly improved left lung ventilation since June. Mild Type of Study: Bedside Swallow Evaluation Previous Swallow Assessment: BSE: reg/ thin  Diet Prior to this Study: Regular;Thin liquids Temperature Spikes Noted: Yes Respiratory Status: Nasal cannula History of Recent Intubation: No Behavior/Cognition: Alert;Cooperative Oral Cavity Assessment: Within Functional Limits Oral Cavity - Dentition: Edentulous Vision: Functional for self-feeding Self-Feeding Abilities: Needs assist (family present at all meals, assistes with feeding ) Patient Positioning: Upright in bed Baseline Vocal Quality: Low vocal intensity Volitional Cough: Strong Volitional Swallow: Able to elicit    Oral/Motor/Sensory  Function Overall Oral Motor/Sensory Function: Generalized oral weakness   Ice Chips Ice chips: Not tested   Thin Liquid Thin Liquid: Within functional limits    Nectar Thick Nectar Thick Liquid: Not tested   Honey Thick Honey Thick Liquid: Not tested   Puree Puree: Within functional limits   Solid   GO    Solid: Impaired Oral Phase Impairments: Impaired mastication (secondary to edentulous oral cavity ) Oral Phase Functional Implications: Prolonged oral transit;Impaired mastication Pharyngeal Phase Impairments: Multiple swallows       Arvil Chaco MA, CCC-SLP Acute Care Speech Language Pathologist    Arvil Chaco E 04/14/2016,2:51 PM

## 2016-04-14 NOTE — Progress Notes (Signed)
OT Screen Note  Patient Details Name: Ellysia Finlinson MRN: [id number] DOB: 05-18-1926   Cancelled Treatment:    Reason Eval/Treat Not Completed: OT screened, no needs identified, will sign off. Patient eating breakfast during therapy arrival. Two daughters present and spoke with therapist. Patient lives with 1 daughter. Family is in and out. She is never alone. An aide is there to assist during first shift. Patient receives assistance for all daily tasks (bathing, dressing transfers - hand held assist). Patient uses a wheelchair, BSC, and shower chair. Daughter is requesting a new BSC. Family have no concerns about patient returning home. No new deficits to report.  Thank you for the referral.  Ailene Ravel, OTR/L,CBIS  [phone]  04/14/2016, 8:42 AM

## 2016-04-14 NOTE — Progress Notes (Signed)
[facility] PROGRESS NOTE  [name] ELF:[id number] DOB: Aug 26, 1926 DOA: 04/11/2016   PCP: Moshe Cipro, MD     Assessment/Plan: Principal Problem:   UTI (urinary tract infection) Active Problems:   Diabetes type 2, controlled ([place])   GERD (gastroesophageal reflux disease)   Breast cancer of upper-outer quadrant of left female breast ([place])   Asthma, chronic    82 y.o. female with medical history significant of remote breast cancer and recent (03/08/16) admission for UTI. Her family reports that she developed fever today to 103.4 - improved with Tylenol prior to ER presentation. Urine looked very milky. She was disoriented a little. Tends to get confused when she has a UTI. Not complaining of urinary symptoms. Milky urine is not new but other symptoms were new today. She did complain of abdominal pain after dinner.  Urine culture from 6/3 grew Klebsiella Pneumoniae resistant to Ampicillin and Nitrofurantoin. Was discharged on Keflex. Saw her PCM on 6/19;was prescribed Cipro by her PCP last week and was started on Bactrim DS today  Assessment and plan UTI/ HCAP-improving Met criteria for sepsis (fever, elevated WBC count, tachycardia)  BP generally in normal range for her, and mentating well Continue telemetry, follow urine culture, recent urine culture from 03/08/16 showed Klebsiella pneumoniae pansensitive Urine culture positive for 80,000 gram-negative rods  Repeat chest x-ray on 7/9 shows improved left lung ventilation,  Discontinue cefepime/vancomycin and change to Omnicef and azithromycin Continue gentle IV hydration, please do not discontinue telemetry Patient will be monitored on telemetry for the next 24 hours that she was tachycardic yesterday morning   History of breast cancer-on tamoxifen not a candidate for curative/palliative treatment  DM -Hold home DM meds -Cover with SSI Last hemoglobin A1c 8.4 on 06/30/15  Essential hypertension Resume  metoprolol due to soft blood pressure, blood pressure soft  Asthma-stable prn Xopenex as needed   DVT prophylaxsis Lovenox    Family Communication: Discussed in detail with the patient, all imaging results, lab results explained to the patient   Disposition Plan: PT evaluation, anticipate discharge on Monday    Consultants:  None    Procedures:  None  Antibiotics: Anti-infectives    Start     Dose/Rate Route Frequency Ordered Stop   04/13/16 1000  vancomycin (VANCOCIN) IVPB 750 mg/150 ml premix     750 mg 150 mL/hr over 60 Minutes Intravenous Every 24 hours 04/12/16 0908     04/12/16 2200  ceFEPIme (MAXIPIME) 1 g in dextrose 5 % 50 mL IVPB     1 g 100 mL/hr over 30 Minutes Intravenous Every 24 hours 04/12/16 0836        HPI/Subjective: Tachycardic yesterday morning after which the telemetry was removed, patient denies any nausea vomiting diarrhea abdominal pain chest pain shortness of breath  Objective: Filed Vitals:   04/13/16 2100 04/14/16 0614 04/14/16 0737 04/14/16 0744  BP: 115/42 114/51    Pulse: 94 87    Temp: 98.1 F (36.7 C) 98.3 F (36.8 C)    TempSrc: Oral Oral    Resp: 20 20    Height:      Weight:      SpO2: 100% 100% 97% 97%    Intake/Output Summary (Last 24 hours) at 04/14/16 0956 Last data filed at 04/13/16 1800  Gross per 24 hour  Intake 2922.5 ml  Output      0 ml  Net 2922.5 ml    Exam:  Examination:  General exam: Appears calm and comfortable  Respiratory system: Clear to auscultation. Respiratory effort normal. Cardiovascular system: S1 & S2 heard, RRR. No JVD, murmurs, rubs, gallops or clicks. No pedal edema. Gastrointestinal system: Abdomen is nondistended, soft and nontender. No organomegaly or masses felt. Normal bowel sounds heard. Central nervous system: Alert and oriented. No focal neurological deficits. Extremities: Symmetric 5 x 5 power. Skin: No rashes, lesions or ulcers Psychiatry: Judgement and insight appear  normal. Mood & affect appropriate.     Data Reviewed: I have personally reviewed following labs and imaging studies  Micro Results Recent Results (from the past 240 hour(s))  Blood Culture (routine x 2)     Status: None (Preliminary result)   Collection Time: 04/11/16 11:22 PM  Result Value Ref Range Status   Specimen Description BLOOD RIGHT ARM  Final   Special Requests BOTTLES DRAWN AEROBIC ONLY 6CC  Final   Culture NO GROWTH 2 DAYS  Final   Report Status PENDING  Incomplete  Blood Culture (routine x 2)     Status: None (Preliminary result)   Collection Time: 04/11/16 11:37 PM  Result Value Ref Range Status   Specimen Description BLOOD RIGHT ARM  Final   Special Requests BOTTLES DRAWN AEROBIC ONLY 6CC  Final   Culture NO GROWTH 2 DAYS  Final   Report Status PENDING  Incomplete  Urine culture     Status: Abnormal (Preliminary result)   Collection Time: 04/12/16 12:42 AM  Result Value Ref Range Status   Specimen Description URINE, CATHETERIZED  Final   Special Requests NONE  Final   Culture 80,000 COLONIES/mL GRAM NEGATIVE RODS (A)  Final   Report Status PENDING  Incomplete    Radiology Reports Dg Chest 2 View  04/13/2016  CLINICAL DATA:  89-year-old female with Healthcare associated pneumonia and fever. Initial encounter. EXAM: CHEST  2 VIEW COMPARISON:  04/11/2016 and earlier. FINDINGS: Seated AP and lateral views of the chest. Lateral view limited by patient positioning and decreased mobility. Chronic moderate elevation of the left hemidiaphragm has not significantly changed since 2016. Significantly improved left lung ventilation since June with mild residual left base opacity. Possible small pleural effusions. Stable cardiac size and mediastinal contours. Stable right lung. Calcified aortic atherosclerosis. Chronic postoperative changes to the left axilla and chest wall. IMPRESSION: 1. Significantly improved left lung ventilation since June. Mild opacity at the left base could  reflect residual infection or atelectasis. 2. No new cardiopulmonary abnormality identified. Electronically Signed   By: H  Hall M.D.   On: 04/13/2016 09:55   Us Renal  04/12/2016  CLINICAL DATA:  89-year-old female inpatient with chronic renal failure. EXAM: RENAL / URINARY TRACT ULTRASOUND COMPLETE COMPARISON:  02/19/2013 CT abdomen/ pelvis. FINDINGS: Right Kidney: Length: 8.1 cm. Echogenicity within normal limits. No mass or hydronephrosis visualized. Left Kidney: Left kidney could not be visualized due to extensive overlying bowel gas. Bladder: Diffuse bladder wall thickening and trabeculation. Fluid-debris level in the dependent bladder. Bilateral bladder wall diverticula. No definite bladder mass demonstrated. IMPRESSION: 1. Small right kidney with no right hydronephrosis. 2. Nonvisualization of the left kidney due to extensive overlying bowel gas. Consider further evaluation with CT abdomen/pelvis if there is clinical concern for left urinary tract obstruction. 3. Diffuse bladder wall thickening and trabeculation. Bilateral bladder wall diverticulum. Fluid-debris level in the bladder. Findings suggest chronic bladder voiding dysfunction with layering bladder debris and/or hemorrhage. Recommend correlation with urinalysis to exclude acute cystitis. Electronically Signed   By: Jason A Poff M.D.   On: 04/12/2016 11:11     Dg Chest Port 1 View  04/12/2016  CLINICAL DATA:  Acute onset of altered mental status. Fever and cloudy urine. Initial encounter. EXAM: PORTABLE CHEST 1 VIEW COMPARISON:  Chest radiograph from 03/14/2016 FINDINGS: The lungs are mildly hypoexpanded. Mild bibasilar opacities may reflect atelectasis or possibly mild pneumonia. No pleural effusion or pneumothorax is seen. The cardiomediastinal silhouette remains normal in size. No acute osseous abnormalities are identified. There is partial obscuration of the left lung apex from the patient's head. IMPRESSION: Lungs mildly hypoexpanded. Mild  bibasilar opacities may reflect atelectasis or possibly mild pneumonia. Electronically Signed   By: Garald Balding M.D.   On: 04/12/2016 00:16     CBC  Recent Labs Lab 04/12/16 0116 04/12/16 0648 04/13/16 0657 04/14/16 0559  WBC 14.6* 12.7* 12.0* 8.8  HGB 9.3* 9.6* 9.9* 8.5*  HCT 29.3* 30.9* 31.5* 27.0*  PLT 174 185 176 169  MCV 91.8 92.8 92.9 92.2  MCH 29.2 28.8 29.2 29.0  MCHC 31.7 31.1 31.4 31.5  RDW 13.7 13.7 14.1 13.9  LYMPHSABS 1.4  --   --   --   MONOABS 0.4  --   --   --   EOSABS 0.0  --   --   --   BASOSABS 0.0  --   --   --     Chemistries   Recent Labs Lab 04/12/16 0116 04/12/16 0648 04/13/16 0657 04/14/16 0559  NA 136 136 137 138  K 3.9 3.9 4.0 3.5  CL 108 105 109 111  CO2 _0 GLUCOSE 238* 179* 144* 157*  BUN _1 CREATININE 0.87 0.89 0.69 0.51  CALCIUM 7.5* 7.7* 7.8* 7.8*  AST 16  --  18 14*  ALT 14  --  15 13*  ALKPHOS 55  --  42 37*  BILITOT 0.3  --  0.3 0.4   ------------------------------------------------------------------------------------------------------------------ estimated creatinine clearance is 36.9 mL/min (by C-G formula based on Cr of 0.51). ------------------------------------------------------------------------------------------------------------------  Recent Labs  04/13/16 0657  HGBA1C 7.3*   ------------------------------------------------------------------------------------------------------------------ No results for input(s): CHOL, HDL, LDLCALC, TRIG, CHOLHDL, LDLDIRECT in the last 72 hours. ------------------------------------------------------------------------------------------------------------------ No results for input(s): TSH, T4TOTAL, T3FREE, THYROIDAB in the last 72 hours.  Invalid input(s): FREET3 ------------------------------------------------------------------------------------------------------------------ No results for input(s): VITAMINB12, FOLATE, FERRITIN, TIBC, IRON, RETICCTPCT in the  last 72 hours.  Coagulation profile No results for input(s): INR, PROTIME in the last 168 hours.  No results for input(s): DDIMER in the last 72 hours.  Cardiac Enzymes  Recent Labs Lab 04/12/16 0116  TROPONINI <0.03   ------------------------------------------------------------------------------------------------------------------ Invalid input(s): POCBNP   CBG:  Recent Labs Lab 04/13/16 0719 04/13/16 1129 04/13/16 1644 04/13/16 2155 04/14/16 0751  GLUCAP 136* 175* 194* 248* 149*       Studies: Dg Chest 2 View  04/13/2016  CLINICAL DATA:  82 year old female with Healthcare associated pneumonia and fever. Initial encounter. EXAM: CHEST  2 VIEW COMPARISON:  04/11/2016 and earlier. FINDINGS: Seated AP and lateral views of the chest. Lateral view limited by patient positioning and decreased mobility. Chronic moderate elevation of the left hemidiaphragm has not significantly changed since 2016. Significantly improved left lung ventilation since June with mild residual left base opacity. Possible small pleural effusions. Stable cardiac size and mediastinal contours. Stable right lung. Calcified aortic atherosclerosis. Chronic postoperative changes to the left axilla and chest wall. IMPRESSION: 1. Significantly improved left lung ventilation since June. Mild opacity at the left base could reflect residual infection or atelectasis. 2. No new  cardiopulmonary abnormality identified. Electronically Signed   By: Genevie Ann M.D.   On: 04/13/2016 09:55   US Renal  04/12/2016  CLINICAL DATA:  82 year old female inpatient with chronic renal failure. EXAM: RENAL / URINARY TRACT ULTRASOUND COMPLETE COMPARISON:  02/19/2013 CT abdomen/ pelvis. FINDINGS: Right Kidney: Length: 8.1 cm. Echogenicity within normal limits. No mass or hydronephrosis visualized. Left Kidney: Left kidney could not be visualized due to extensive overlying bowel gas. Bladder: Diffuse bladder wall thickening and trabeculation.  Fluid-debris level in the dependent bladder. Bilateral bladder wall diverticula. No definite bladder mass demonstrated. IMPRESSION: 1. Small right kidney with no right hydronephrosis. 2. Nonvisualization of the left kidney due to extensive overlying bowel gas. Consider further evaluation with CT abdomen/pelvis if there is clinical concern for left urinary tract obstruction. 3. Diffuse bladder wall thickening and trabeculation. Bilateral bladder wall diverticulum. Fluid-debris level in the bladder. Findings suggest chronic bladder voiding dysfunction with layering bladder debris and/or hemorrhage. Recommend correlation with urinalysis to exclude acute cystitis. Electronically Signed   By: Ilona Sorrel M.D.   On: 04/12/2016 11:11      Lab Results  Component Value Date   HGBA1C 7.3* 04/13/2016   HGBA1C 8.4* 06/30/2015   HGBA1C 8.2* 05/01/2015   Lab Results  Component Value Date   CREATININE 0.51 04/14/2016       Scheduled Meds: . aspirin EC  81 mg Oral Daily  . atorvastatin  20 mg Oral QHS  . ceFEPime (MAXIPIME) IV  1 g Intravenous Q24H  . docusate sodium  100 mg Oral BID  . enoxaparin (LOVENOX) injection  40 mg Subcutaneous Q24H  . ferrous sulfate  325 mg Oral Daily  . insulin aspart  0-9 Units Subcutaneous TID WC  . lactobacillus acidophilus & bulgar  1 tablet Oral Daily  . levalbuterol  1.25 mg Nebulization BID  . magnesium oxide  200 mg Oral Daily  . mometasone-formoterol  2 puff Inhalation BID  . naphazoline-glycerin  1 drop Both Eyes q morning - 10a  . pantoprazole  40 mg Oral Daily  . pantoprazole  40 mg Oral Daily  . predniSONE  5 mg Oral Q breakfast  . raloxifene  60 mg Oral q morning - 10a  . senna  1 tablet Oral BID  . tamoxifen  20 mg Oral Daily  . vancomycin  750 mg Intravenous Q24H   Continuous Infusions: . sodium chloride 75 mL/hr at 04/13/16 1825     LOS: 2 days    Time spent: >30 MINS    [facility]  [facility] Pager [phone]. If 7PM-7AM,  please contact night-coverage at [email or web address], password [facility] 04/14/2016, 9:56 AM  LOS: 2 days

## 2016-04-14 NOTE — Progress Notes (Signed)
PT screen note  Patient Details Name: [name] MRN: [id number] DOB: Jan 20, 1926   Cancelled Treatment: Reason Eval/Treat Not Completed: PT screened, no needs identified, will sign off. Patient visiting with three daughters, A&Ox3. Patient lives with 1 daughter. Family is always around and she has a sitter/aide throughout the day so she is never alone. Patient receives Max to total assistance for all daily tasks (bathing, dressing transfers). Patient uses a wheelchair, BSC, and shower chair. Daughter is requesting a new BSC and wheelchair at this time. Family have no concerns about patient returning home and demonstrated the ability to assist with sit to stand transfer without assistance from PT. Will sign off. Thank you for the referral  10:39 AM,04/14/2016 Elly Modena PT, [facility] [phone]

## 2016-04-14 NOTE — Progress Notes (Addendum)
Inpatient Diabetes Program Recommendations  AACE/ADA: New Consensus Statement on Inpatient Glycemic Control (2015)  Target Ranges:  Prepandial:   less than 140 mg/dL      Peak postprandial:   less than 180 mg/dL (1-2 hours)      Critically ill patients:  140 - 180 mg/dL   Results for HERMINE, DEPENA (MRN [id number]) as of 04/14/2016 12:50  Ref. Range 04/13/2016 07:19 04/13/2016 11:29 04/13/2016 16:44 04/13/2016 21:55  Glucose-Capillary Latest Ref Range: 65-99 mg/dL 136 (H) 175 (H) 194 (H) 248 (H)   Results for JERA, KRAHMER (MRN [id number]) as of 04/14/2016 12:50  Ref. Range 04/14/2016 07:51 04/14/2016 11:55  Glucose-Capillary Latest Ref Range: 65-99 mg/dL 149 (H) 200 (H)    Home DM Meds: Metformin 500 mg AM/ 1000 mg PM       Tradjenta 5 mg daily  Current Insulin Orders: Novolog Sensitive Correction Scale/ SSI (0-9 units) TID AC      MD- Please consider increasing Novolog SSI to Moderate scale (0-15 units) TID AC + HS while home DM medications are on hold.     --Will follow patient during hospitalization--  Wyn Quaker RN, MSN, CDE Diabetes Coordinator Inpatient Glycemic Control Team Team Pager: [phone] (8a-5p)

## 2016-04-15 DIAGNOSIS — N342 Other urethritis: Secondary | ICD-10-CM

## 2016-04-15 LAB — URINE CULTURE

## 2016-04-15 LAB — GLUCOSE, CAPILLARY
GLUCOSE-CAPILLARY: 165 mg/dL — AB (ref 65–99)
GLUCOSE-CAPILLARY: 132 mg/dL — AB (ref 65–99)

## 2016-04-15 MED ORDER — AZITHROMYCIN 250 MG PO TABS: ORAL_TABLET | ORAL | Status: DC

## 2016-04-15 MED ORDER — MAGNESIUM OXIDE 400 (241.3 MG) MG PO TABS: 200.0000 mg | ORAL_TABLET | Freq: Every day | ORAL | Status: AC

## 2016-04-15 MED ORDER — BENZONATATE 100 MG PO CAPS: 100.0000 mg | ORAL_CAPSULE | Freq: Three times a day (TID) | ORAL | Status: DC | PRN

## 2016-04-15 MED ORDER — CEFDINIR 125 MG/5ML PO SUSR: 250.0000 mg | Freq: Two times a day (BID) | ORAL | Status: DC

## 2016-04-15 MED ORDER — CEFDINIR 300 MG PO CAPS: 300.0000 mg | ORAL_CAPSULE | Freq: Two times a day (BID) | ORAL | Status: DC

## 2016-04-15 NOTE — Clinical Documentation Improvement (Signed)
Hospitalist  Please update your documentation within the medical record to reflect your response to this query. Thank you  Based on the clinical indicators below please clarify if any of the following conditions is clinically supported.    Protein Calorie Malnutrition (if so please specify the severity: mild / moderate / severe)  Protein Malnutrition (if so please specify the severity: mild / moderate / severe)  [place]  Underweight  Other condition  Clinically Undetermine   Supporting Information:   04/12/16 ED note.Marland KitchenMarland Kitchen"Ht 5\' 3"  (1.6 m)  Wt 106 lb (48.081 kg)  BMI 18.78 kg/m2"...  Please exercise your independent, professional judgment when responding. A specific answer is not anticipated or expected.  Thank You, Ermelinda Das, RN, BSN, [place] Certified Clinical Documentation Specialist [place]: Health Information Management [phone]

## 2016-04-15 NOTE — Progress Notes (Signed)
Inpatient Diabetes Program Recommendations  AACE/ADA: New Consensus Statement on Inpatient Glycemic Control (2015)  Target Ranges:  Prepandial:   less than 140 mg/dL      Peak postprandial:   less than 180 mg/dL (1-2 hours)      Critically ill patients:  140 - 180 mg/dL  Results for [name], [name] (MRN [id number]) as of 04/15/2016 08:14  Ref. Range 04/14/2016 07:51 04/14/2016 11:55 04/14/2016 16:33 04/14/2016 22:09 04/15/2016 07:36  Glucose-Capillary Latest Ref Range: 65-99 mg/dL 149 (H) 200 (H) 222 (H) 165 (H) 132 (H)    Review of Glycemic Control  Diabetes history: DM2 Outpatient Diabetes medications: Metformin 500 mg QAM, Metformin 1000 mg QHS, Tradjenta 5 mg QAM Current orders for Inpatient glycemic control: Novolog 0-9 units TID with meals  Inpatient Diabetes Program Recommendations: Insulin - Meal Coverage: If patient is eating at least 50% of meals, please consider ordering Novolog 2 units TID with meals for meal coverage.  Thanks, Barnie Alderman, RN, MSN, CDE Diabetes Coordinator Inpatient Diabetes Program [phone] (Team Pager from [place] to [place]) [phone] ([place] office) [phone] [facility] office) [phone] [facility] office)

## 2016-04-15 NOTE — Discharge Summary (Signed)
Physician Discharge Summary  [name] MRN: [id number] DOB/AGE: December 04, 1925 82 y.o.  PCP: Moshe Cipro, MD   Admit date: 04/11/2016 Discharge date: 04/15/2016  Discharge Diagnoses:     Principal Problem:   UTI (urinary tract infection) Active Problems:   Diabetes type 2, controlled ([place])   GERD (gastroesophageal reflux disease)   Breast cancer of upper-outer quadrant of left female breast (HCC)   Asthma, chronic Moderate protein calorie malnutrition    Follow-up recommendations Follow-up with PCP in 3-5 days , including all  additional recommended appointments as below Follow-up CBC, CMP in 3-5 days       Current Discharge Medication List    START taking these medications   Details  azithromycin (ZITHROMAX) 250 MG tablet 6 days Qty: 6 each, Refills: 0    benzonatate (TESSALON) 100 MG capsule Take 1 capsule (100 mg total) by mouth 3 (three) times daily as needed for cough. Qty: 20 capsule, Refills: 0    cefdinir (OMNICEF) 300 MG capsule Take 1 capsule (300 mg total) by mouth 2 (two) times daily. Qty: 12 capsule, Refills: 0    magnesium oxide (MAG-OX) 400 (241.3 Mg) MG tablet Take 0.5 tablets (200 mg total) by mouth daily. Qty: 30 tablet, Refills: 1      CONTINUE these medications which have NOT CHANGED   Details  albuterol (PROVENTIL HFA) 108 (90 BASE) MCG/ACT inhaler Inhale 2 puffs into the lungs every 6 (six) hours as needed for shortness of breath.     albuterol (PROVENTIL) (2.5 MG/3ML) 0.083% nebulizer solution Take 2.5 mg by nebulization daily. With Ipratropium as needed for shortness of breath    aspirin EC 81 MG tablet Take 81 mg by mouth daily.     atorvastatin (LIPITOR) 20 MG tablet Take 20 mg by mouth at bedtime.    budesonide-formoterol (SYMBICORT) 160-4.5 MCG/ACT inhaler Inhale 2 puffs into the lungs 2 (two) times daily.    Chlorpheniramine-Acetaminophen (CORICIDIN HBP COLD/FLU PO) Take 1 capsule by mouth every 6 (six) hours as needed (for cold  symptoms).     Cholecalciferol (VITAMIN D3) 1000 UNITS CAPS Take 2,000 Units by mouth daily.     ciclopirox (PENLAC) 8 % solution APPLY TO FUNGAL TOENAILS EVERY DAY Refills: 1    ferrous sulfate 325 (65 FE) MG tablet Take 325 mg by mouth daily. Refills: 3    ipratropium (ATROVENT) 0.02 % nebulizer solution Take 500 mcg by nebulization daily. With albuterol as needed for shortness of breath    lactobacillus acidophilus & bulgar (LACTINEX) chewable tablet CHEW 1 TABLET BY MOUTH EVERY DAY Refills: 6    linagliptin (TRADJENTA) 5 MG TABS tablet Take 5 mg by mouth every morning.    Magnesium ([employer] TRIPLE MAGNESIUM COMPLEX) 400 MG CAPS Take 1 capsule by mouth daily.    metFORMIN (GLUCOPHAGE) 500 MG tablet Take 500-1,000 mg by mouth 2 (two) times daily. 1 in the morning and 2 at bedtime    metoprolol succinate (TOPROL XL) 25 MG 24 hr tablet Take 1 tablet (25 mg total) by mouth daily. Qty: 30 tablet, Refills: 0    Multiple Vitamins-Minerals (CENTRUM SILVER ADULT 50+ PO) Take 1 tablet by mouth every morning.     Omega-3 Fatty Acids (FISH OIL) 1200 MG CAPS Take 1 capsule by mouth daily.    pantoprazole (PROTONIX) 40 MG tablet Take 40 mg by mouth daily.    Polyvinyl Alcohol-Povidone (CLEAR EYES ALL SEASONS) 5-6 MG/ML SOLN Apply 1 drop to eye every morning.     predniSONE (DELTASONE) 5  MG tablet Take 5 mg by mouth daily with breakfast.    raloxifene (EVISTA) 60 MG tablet Take 60 mg by mouth every morning.    tamoxifen (NOLVADEX) 20 MG tablet TAKE 1 TABLET EVERY DAY Qty: 30 tablet, Refills: 2         Discharge Condition: Overall prognosis poor-history of breast cancer, cachexia   Discharge Instructions Get Medicines reviewed and adjusted: Please take all your medications with you for your next visit with your Primary MD  Please request your Primary MD to go over all hospital tests and procedure/radiological results at the follow up, please ask your Primary MD to get all Hospital  records sent to his/her office.  If you experience worsening of your admission symptoms, develop shortness of breath, life threatening emergency, suicidal or homicidal thoughts you must seek medical attention immediately by calling 911 or calling your MD immediately if symptoms less severe.  You must read complete instructions/literature along with all the possible adverse reactions/side effects for all the Medicines you take and that have been prescribed to you. Take any new Medicines after you have completely understood and accpet all the possible adverse reactions/side effects.   Do not drive when taking Pain medications.   Do not take more than prescribed Pain, Sleep and Anxiety Medications  Special Instructions: If you have smoked or chewed Tobacco in the last 2 yrs please stop smoking, stop any regular Alcohol and or any Recreational drug use.  Wear Seat belts while driving.  Please note  You were cared for by a hospitalist during your hospital stay. Once you are discharged, your primary care physician will handle any further medical issues. Please note that NO REFILLS for any discharge medications will be authorized once you are discharged, as it is imperative that you return to your primary care physician (or establish a relationship with a primary care physician if you do not have one) for your aftercare needs so that they can reassess your need for medications and monitor your lab values.  Discharge Instructions    Diet - low sodium heart healthy    Complete by:  As directed      Increase activity slowly    Complete by:  As directed             Allergies  Allergen Reactions  . Aspirin Other (See Comments)    Cannot take uncoated due to stomach ulcers      Disposition: 01-Home or Self Care   Consults: None     Significant Diagnostic Studies:  Dg Chest 2 View  04/13/2016  CLINICAL DATA:  82 year old female with Healthcare associated pneumonia and fever.  Initial encounter. EXAM: CHEST  2 VIEW COMPARISON:  04/11/2016 and earlier. FINDINGS: Seated AP and lateral views of the chest. Lateral view limited by patient positioning and decreased mobility. Chronic moderate elevation of the left hemidiaphragm has not significantly changed since 2016. Significantly improved left lung ventilation since June with mild residual left base opacity. Possible small pleural effusions. Stable cardiac size and mediastinal contours. Stable right lung. Calcified aortic atherosclerosis. Chronic postoperative changes to the left axilla and chest wall. IMPRESSION: 1. Significantly improved left lung ventilation since June. Mild opacity at the left base could reflect residual infection or atelectasis. 2. No new cardiopulmonary abnormality identified. Electronically Signed   By: Genevie Ann M.D.   On: 04/13/2016 09:55   US Renal  04/12/2016  CLINICAL DATA:  82 year old female inpatient with chronic renal failure. EXAM: RENAL / URINARY TRACT  ULTRASOUND COMPLETE COMPARISON:  02/19/2013 CT abdomen/ pelvis. FINDINGS: Right Kidney: Length: 8.1 cm. Echogenicity within normal limits. No mass or hydronephrosis visualized. Left Kidney: Left kidney could not be visualized due to extensive overlying bowel gas. Bladder: Diffuse bladder wall thickening and trabeculation. Fluid-debris level in the dependent bladder. Bilateral bladder wall diverticula. No definite bladder mass demonstrated. IMPRESSION: 1. Small right kidney with no right hydronephrosis. 2. Nonvisualization of the left kidney due to extensive overlying bowel gas. Consider further evaluation with CT abdomen/pelvis if there is clinical concern for left urinary tract obstruction. 3. Diffuse bladder wall thickening and trabeculation. Bilateral bladder wall diverticulum. Fluid-debris level in the bladder. Findings suggest chronic bladder voiding dysfunction with layering bladder debris and/or hemorrhage. Recommend correlation with urinalysis to  exclude acute cystitis. Electronically Signed   By: Ilona Sorrel M.D.   On: 04/12/2016 11:11   Dg Chest Port 1 View  04/12/2016  CLINICAL DATA:  Acute onset of altered mental status. Fever and cloudy urine. Initial encounter. EXAM: PORTABLE CHEST 1 VIEW COMPARISON:  Chest radiograph from 03/14/2016 FINDINGS: The lungs are mildly hypoexpanded. Mild bibasilar opacities may reflect atelectasis or possibly mild pneumonia. No pleural effusion or pneumothorax is seen. The cardiomediastinal silhouette remains normal in size. No acute osseous abnormalities are identified. There is partial obscuration of the left lung apex from the patient's head. IMPRESSION: Lungs mildly hypoexpanded. Mild bibasilar opacities may reflect atelectasis or possibly mild pneumonia. Electronically Signed   By: Garald Balding M.D.   On: 04/12/2016 00:16        Filed Weights   04/11/16 2258  Weight: 48.081 kg (106 lb)     Microbiology: Recent Results (from the past 240 hour(s))  Blood Culture (routine x 2)     Status: None (Preliminary result)   Collection Time: 04/11/16 11:22 PM  Result Value Ref Range Status   Specimen Description BLOOD RIGHT ARM  Final   Special Requests BOTTLES DRAWN AEROBIC ONLY 6CC  Final   Culture NO GROWTH 3 DAYS  Final   Report Status PENDING  Incomplete  Blood Culture (routine x 2)     Status: None (Preliminary result)   Collection Time: 04/11/16 11:37 PM  Result Value Ref Range Status   Specimen Description BLOOD RIGHT ARM  Final   Special Requests BOTTLES DRAWN AEROBIC ONLY 6CC  Final   Culture NO GROWTH 3 DAYS  Final   Report Status PENDING  Incomplete  Urine culture     Status: Abnormal   Collection Time: 04/12/16 12:42 AM  Result Value Ref Range Status   Specimen Description URINE, CATHETERIZED  Final   Special Requests NONE  Final   Culture 80,000 COLONIES/mL ESCHERICHIA COLI (A)  Final   Report Status 04/15/2016 FINAL  Final   Organism ID, Bacteria ESCHERICHIA COLI (A)  Final       Susceptibility   Escherichia coli - MIC*    AMPICILLIN >=32 RESISTANT Resistant     CEFAZOLIN <=4 SENSITIVE Sensitive     CEFTRIAXONE <=1 SENSITIVE Sensitive     CIPROFLOXACIN >=4 RESISTANT Resistant     GENTAMICIN <=1 SENSITIVE Sensitive     IMIPENEM <=0.25 SENSITIVE Sensitive     NITROFURANTOIN <=16 SENSITIVE Sensitive     TRIMETH/SULFA >=320 RESISTANT Resistant     AMPICILLIN/SULBACTAM 16 INTERMEDIATE Intermediate     PIP/TAZO <=4 SENSITIVE Sensitive     * 80,000 COLONIES/mL ESCHERICHIA COLI       Blood Culture    Component Value Date/Time   SDES  URINE, CATHETERIZED 04/12/2016 0042   SPECREQUEST NONE 04/12/2016 0042   CULT 80,000 COLONIES/mL ESCHERICHIA COLI* 04/12/2016 0042   REPTSTATUS 04/15/2016 FINAL 04/12/2016 0042      Labs: Results for orders placed or performed during the hospital encounter of 04/11/16 (from the past 48 hour(s))  Glucose, capillary     Status: Abnormal   Collection Time: 04/13/16 11:29 AM  Result Value Ref Range   Glucose-Capillary 175 (H) 65 - 99 mg/dL   Comment 1 Notify RN    Comment 2 Document in Chart   Glucose, capillary     Status: Abnormal   Collection Time: 04/13/16  4:44 PM  Result Value Ref Range   Glucose-Capillary 194 (H) 65 - 99 mg/dL   Comment 1 Notify RN    Comment 2 Document in Chart   Glucose, capillary     Status: Abnormal   Collection Time: 04/13/16  9:55 PM  Result Value Ref Range   Glucose-Capillary 248 (H) 65 - 99 mg/dL   Comment 1 Notify RN    Comment 2 Document in Chart   CBC     Status: Abnormal   Collection Time: 04/14/16  5:59 AM  Result Value Ref Range   WBC 8.8 4.0 - 10.5 K/uL   RBC 2.93 (L) 3.87 - 5.11 MIL/uL   Hemoglobin 8.5 (L) 12.0 - 15.0 g/dL   HCT 27.0 (L) 36.0 - 46.0 %   MCV 92.2 78.0 - 100.0 fL   MCH 29.0 26.0 - 34.0 pg   MCHC 31.5 30.0 - 36.0 g/dL   RDW 13.9 11.5 - 15.5 %   Platelets 169 150 - 400 K/uL  Comprehensive metabolic panel     Status: Abnormal   Collection Time: 04/14/16  5:59  AM  Result Value Ref Range   Sodium 138 135 - 145 mmol/L   Potassium 3.5 3.5 - 5.1 mmol/L   Chloride 111 101 - 111 mmol/L   CO2 27 22 - 32 mmol/L   Glucose, Bld 157 (H) 65 - 99 mg/dL   BUN 6 6 - 20 mg/dL   Creatinine, Ser 0.51 0.44 - 1.00 mg/dL   Calcium 7.8 (L) 8.9 - 10.3 mg/dL   Total Protein 4.3 (L) 6.5 - 8.1 g/dL   Albumin 2.0 (L) 3.5 - 5.0 g/dL   AST 14 (L) 15 - 41 U/L   ALT 13 (L) 14 - 54 U/L   Alkaline Phosphatase 37 (L) 38 - 126 U/L   Total Bilirubin 0.4 0.3 - 1.2 mg/dL   GFR calc non Af Amer >60 >60 mL/min   GFR calc Af Amer >60 >60 mL/min    Comment: (NOTE) The eGFR has been calculated using the CKD EPI equation. This calculation has not been validated in all clinical situations. eGFR's persistently <60 mL/min signify possible Chronic Kidney Disease.   Glucose, capillary     Status: Abnormal   Collection Time: 04/14/16  7:51 AM  Result Value Ref Range   Glucose-Capillary 149 (H) 65 - 99 mg/dL  Glucose, capillary     Status: Abnormal   Collection Time: 04/14/16 11:55 AM  Result Value Ref Range   Glucose-Capillary 200 (H) 65 - 99 mg/dL   Comment 1 Notify RN   Glucose, capillary     Status: Abnormal   Collection Time: 04/14/16  4:33 PM  Result Value Ref Range   Glucose-Capillary 222 (H) 65 - 99 mg/dL   Comment 1 Notify RN   Glucose, capillary     Status: Abnormal  Collection Time: 04/14/16 10:09 PM  Result Value Ref Range   Glucose-Capillary 165 (H) 65 - 99 mg/dL  Glucose, capillary     Status: Abnormal   Collection Time: 04/15/16  7:36 AM  Result Value Ref Range   Glucose-Capillary 132 (H) 65 - 99 mg/dL     Lipid Panel  No results found for: CHOL, TRIG, HDL, CHOLHDL, VLDL, LDLCALC, LDLDIRECT   Lab Results  Component Value Date   HGBA1C 7.3* 04/13/2016   HGBA1C 8.4* 06/30/2015   HGBA1C 8.2* 05/01/2015        82 y.o. female with medical history significant of remote breast cancer and recent (03/08/16) admission for UTI. Her family reports that she  developed fever today to 103.4 - improved with Tylenol prior to ER presentation. Urine looked very milky. She was disoriented a little. Tends to get confused when she has a UTI. Not complaining of urinary symptoms. Milky urine is not new but other symptoms were new today. She did complain of abdominal pain after dinner.  Urine culture from 6/3 grew Klebsiella Pneumoniae resistant to Ampicillin and Nitrofurantoin. Was discharged on Keflex. Saw her PCM on 6/19;was prescribed Cipro by her PCP last week and was started on Bactrim DS today  Assessment and plan UTI/ HCAP-improving , initial chest x-ray suspicious for pneumonia Met criteria for sepsis (fever, elevated WBC count, tachycardia) BP generally in normal range for her, and mentating well Continue telemetry, follow urine culture, recent urine culture from 03/08/16 showed Klebsiella pneumoniae pansensitive Urine culture positive for 80,000 gram-negative rods ,  now showing Escherichia coli Repeat chest x-ray on 7/9 shows improved left lung ventilation,  Discontinued cefepime/vancomycin and changed to Omnicef and azithromycin for UTI and pneumonia Slight tachycardia and telemetry that resolved after resuming beta blocker Discussed with the daughter is on a daily basis, patient significantly better today and anticipate she will do well at home from a pneumonia standpoint    History of breast cancer-on tamoxifen not a candidate for curative/palliative treatment  DM Resume home diabetic regimen -Continue Accu-Cheks Hemoglobin A1c 7.3  Essential hypertension Resume metoprolol due to soft blood pressure, blood pressure soft  Asthma-stable prn Xopenex as needed   Discharge Exam:    Blood pressure 112/77, pulse 78, temperature 98.4 F (36.9 C), temperature source Oral, resp. rate 21, height 5' 3" (1.6 m), weight 48.081 kg (106 lb), SpO2 99 %.  General exam: Appears calm and comfortable  Respiratory system: Clear to  auscultation. Respiratory effort normal. Cardiovascular system: S1 & S2 heard, RRR. No JVD, murmurs, rubs, gallops or clicks. No pedal edema. Gastrointestinal system: Abdomen is nondistended, soft and nontender. No organomegaly or masses felt. Normal bowel sounds heard. Central nervous system: Alert and oriented. No focal neurological deficits. Extremities: Symmetric 5 x 5 power. Skin: No rashes, lesions or ulcers Psychiatry: Judgement and insight appear normal. Mood & affect appropriate.     Follow-up Information    Follow up with CAPLAN,MICHAEL, MD. Schedule an appointment as soon as possible for a visit in 3 days.   Specialty:  Internal Medicine   Why:  Hospital follow-up   Contact information:   [address] [place]  [id number] [phone]       Signed: Reyne Dumas 04/15/2016, 9:32 AM        Time spent >45 mins

## 2016-04-15 NOTE — Progress Notes (Signed)
Discharge instructions reviewed with patient and daughter at bedside. Verbalized understanding of instructions, home medications and follow-up. IV site d/c'd per nurse tech, site within normal limits. Gauze dressing present, clean dry and intact. Telemetry d/c'd for discharge. Pt in stable condition awaiting discharge home. Daughter states she and her sisters will transport her home. Donavan Foil, RN

## 2016-04-15 NOTE — Progress Notes (Signed)
Patient left floor in stable condition accompanied by nurse tech and family. Discharged home. Donavan Foil, RN

## 2016-04-16 LAB — CULTURE, BLOOD (ROUTINE X 2)
Culture: NO GROWTH
Culture: NO GROWTH

## 2016-04-17 ENCOUNTER — Other Ambulatory Visit (HOSPITAL_COMMUNITY): Payer: Self-pay | Admitting: Hematology & Oncology

## 2016-04-21 ENCOUNTER — Other Ambulatory Visit (HOSPITAL_COMMUNITY): Payer: Self-pay | Admitting: Oncology

## 2016-04-21 DIAGNOSIS — C50412 Malignant neoplasm of upper-outer quadrant of left female breast: Secondary | ICD-10-CM

## 2016-04-21 MED ORDER — TAMOXIFEN CITRATE 20 MG PO TABS: 20.0000 mg | ORAL_TABLET | Freq: Every day | ORAL | Status: DC

## 2016-05-26 ENCOUNTER — Encounter (HOSPITAL_COMMUNITY): Payer: Medicare Other

## 2016-05-26 ENCOUNTER — Encounter (HOSPITAL_COMMUNITY): Payer: Self-pay | Admitting: Hematology & Oncology

## 2016-05-26 ENCOUNTER — Encounter (HOSPITAL_COMMUNITY): Payer: Medicare Other | Attending: Hematology & Oncology | Admitting: Hematology & Oncology

## 2016-05-26 VITALS — BP 115/66 | HR 97 | Temp 98.3°F | Resp 16

## 2016-05-26 DIAGNOSIS — C50412 Malignant neoplasm of upper-outer quadrant of left female breast: Secondary | ICD-10-CM | POA: Diagnosis present

## 2016-05-26 DIAGNOSIS — D649 Anemia, unspecified: Secondary | ICD-10-CM

## 2016-05-26 DIAGNOSIS — M4000 Postural kyphosis, site unspecified: Secondary | ICD-10-CM

## 2016-05-26 DIAGNOSIS — Z79811 Long term (current) use of aromatase inhibitors: Secondary | ICD-10-CM

## 2016-05-26 DIAGNOSIS — C50912 Malignant neoplasm of unspecified site of left female breast: Secondary | ICD-10-CM

## 2016-05-26 LAB — COMPREHENSIVE METABOLIC PANEL
ALK PHOS: 51 U/L (ref 38–126)
ALT: 14 U/L (ref 14–54)
ANION GAP: 7 (ref 5–15)
AST: 19 U/L (ref 15–41)
Albumin: 2.9 g/dL — ABNORMAL LOW (ref 3.5–5.0)
BUN: 20 mg/dL (ref 6–20)
CALCIUM: 8.9 mg/dL (ref 8.9–10.3)
CO2: 29 mmol/L (ref 22–32)
CREATININE: 1.04 mg/dL — AB (ref 0.44–1.00)
Chloride: 100 mmol/L — ABNORMAL LOW (ref 101–111)
GFR, EST AFRICAN AMERICAN: 54 mL/min — AB (ref >60)
GFR, EST NON AFRICAN AMERICAN: 47 mL/min — AB (ref >60)
Glucose, Bld: 197 mg/dL — ABNORMAL HIGH (ref 65–99)
Potassium: 4.1 mmol/L (ref 3.5–5.1)
SODIUM: 136 mmol/L (ref 135–145)
Total Bilirubin: 0.3 mg/dL (ref 0.3–1.2)
Total Protein: 6.4 g/dL — ABNORMAL LOW (ref 6.5–8.1)

## 2016-05-26 LAB — CBC WITH DIFFERENTIAL/PLATELET
Basophils Absolute: 0 10*3/uL (ref 0.0–0.1)
Basophils Relative: 0 %
EOS ABS: 0.2 10*3/uL (ref 0.0–0.7)
EOS PCT: 1 %
HCT: 36 % (ref 36.0–46.0)
Hemoglobin: 11 g/dL — ABNORMAL LOW (ref 12.0–15.0)
LYMPHS ABS: 2.8 10*3/uL (ref 0.7–4.0)
LYMPHS PCT: 22 %
MCH: 28.4 pg (ref 26.0–34.0)
MCHC: 30.6 g/dL (ref 30.0–36.0)
MCV: 93 fL (ref 78.0–100.0)
MONOS PCT: 5 %
Monocytes Absolute: 0.7 10*3/uL (ref 0.1–1.0)
Neutro Abs: 9 10*3/uL — ABNORMAL HIGH (ref 1.7–7.7)
Neutrophils Relative %: 72 %
PLATELETS: 279 10*3/uL (ref 150–400)
RBC: 3.87 MIL/uL (ref 3.87–5.11)
RDW: 14.1 % (ref 11.5–15.5)
WBC: 12.7 10*3/uL — AB (ref 4.0–10.5)

## 2016-05-26 NOTE — Progress Notes (Signed)
[facility] NOTE  Patient Care Team: Moshe Cipro, MD as PCP - General (Internal Medicine)  CHIEF COMPLAINTS/PURPOSE OF CONSULTATION:  Left Breast Cancer Biopsy on 03/20/2015 with ER- PR+(1% with strong staining intensity) HER2 neu- invasive ductal carcinoma pT2, pN0 Mastectomy on 05/09/2015  HISTORY OF PRESENTING ILLNESS:  [name] 82 y.o. female is here for follow-up of L breast cancer s/p mastectomy, she has ER-, PR+ disease. She is currently on Tamoxifen.   She has had a hysterectomy. [name] returns to the [facility] with several family members. She is confined to a wheelchair due to her significant kyphosis.  [name] is accompanied by two female family members. Patient presents in wheelchair. I personally reviewed and went over laboratory studies with the patient and her family.  Since our last visit, she was hospitalized with a urinary tract infection.  She has not had any issues with the Tamoxifen.  She has trouble eating sometimes with her appetite. Family notes however that they feed her and tell her she is going to eat. She is sleeping well. They have not noticed any new lumps or bumps. No changes to her chest wall.  MEDICAL HISTORY:  Past Medical History:  Diagnosis Date  . Acid reflux   . Anemia   . Arthritis    ra  . Asthma   . Cancer [facility])    breast cancer  . Diabetes mellitus without complication ([place])   . Gastric ulcer   . Hypertension   . Kyphosis   . Raynaud disease     SURGICAL HISTORY: Past Surgical History:  Procedure Laterality Date  . BREAST SURGERY    . HERNIA REPAIR    . MASTECTOMY Right 1978  . SIMPLE MASTECTOMY WITH AXILLARY SENTINEL NODE BIOPSY Left 05/09/2015   Procedure: LEFT SIMPLE MASTECTOMY;  Surgeon: Erroll Luna, MD;  Location: [facility];  Service: General;  Laterality: Left;    SOCIAL HISTORY: Social History   Social History  . Marital status: Widowed    Spouse name: N/A    . Number of children: N/A  . Years of education: N/A   Occupational History  . Not on file.   Social History Main Topics  . Smoking status: Never Smoker  . Smokeless tobacco: Never Used  . Alcohol use No  . Drug use: No  . Sexual activity: No   Other Topics Concern  . Not on file   Social History Narrative  . No narrative on file  4 daughters 8 grandchildren 2 great grandchildren Widowed Ex-cleaner Non smoker ETOH, none   FAMILY HISTORY: Family History  Problem Relation Age of Onset  . Asthma Sister   . Rheum arthritis Daughter    has no family status information on file.   Father deceased, 91 Mother deceased, young age 15 sisters deceased 2 half-sisters alive 70 brothers   ALLERGIES:  is allergic to aspirin.  MEDICATIONS:  Current Outpatient Prescriptions  Medication Sig Dispense Refill  . albuterol (PROVENTIL HFA) 108 (90 BASE) MCG/ACT inhaler Inhale 2 puffs into the lungs every 6 (six) hours as needed for shortness of breath.     Marland Kitchen albuterol (PROVENTIL) (2.5 MG/3ML) 0.083% nebulizer solution Take 2.5 mg by nebulization daily. With Ipratropium as needed for shortness of breath    . aspirin EC 81 MG tablet Take 81 mg by mouth daily.     Marland Kitchen atorvastatin (LIPITOR) 20 MG tablet Take 20 mg by mouth at bedtime.    Marland Kitchen  azithromycin (ZITHROMAX) 250 MG tablet 6 days 6 each 0  . benzonatate (TESSALON) 100 MG capsule Take 1 capsule (100 mg total) by mouth 3 (three) times daily as needed for cough. 20 capsule 0  . budesonide-formoterol (SYMBICORT) 160-4.5 MCG/ACT inhaler Inhale 2 puffs into the lungs 2 (two) times daily.    . cefdinir (OMNICEF) 300 MG capsule Take 1 capsule (300 mg total) by mouth 2 (two) times daily. 12 capsule 0  . Chlorpheniramine-Acetaminophen (CORICIDIN HBP COLD/FLU PO) Take 1 capsule by mouth every 6 (six) hours as needed (for cold symptoms).     . Cholecalciferol (VITAMIN D3) 1000 UNITS CAPS Take 2,000 Units by mouth daily.     . ciclopirox  (PENLAC) 8 % solution APPLY TO FUNGAL TOENAILS EVERY DAY  1  . ferrous sulfate 325 (65 FE) MG tablet Take 325 mg by mouth daily.  3  . ipratropium (ATROVENT) 0.02 % nebulizer solution Take 500 mcg by nebulization daily. With albuterol as needed for shortness of breath    . lactobacillus acidophilus & bulgar (LACTINEX) chewable tablet CHEW 1 TABLET BY MOUTH EVERY DAY  6  . linagliptin (TRADJENTA) 5 MG TABS tablet Take 5 mg by mouth every morning.    . Magnesium (CVS TRIPLE MAGNESIUM COMPLEX) 400 MG CAPS Take 1 capsule by mouth daily.    . magnesium oxide (MAG-OX) 400 (241.3 Mg) MG tablet Take 0.5 tablets (200 mg total) by mouth daily. 30 tablet 1  . metFORMIN (GLUCOPHAGE) 500 MG tablet Take 500-1,000 mg by mouth 2 (two) times daily. 1 in the morning and 2 at bedtime    . metoprolol succinate (TOPROL XL) 25 MG 24 hr tablet Take 1 tablet (25 mg total) by mouth daily. 30 tablet 0  . Multiple Vitamins-Minerals (CENTRUM SILVER ADULT 50+ PO) Take 1 tablet by mouth every morning.     . Omega-3 Fatty Acids (FISH OIL) 1200 MG CAPS Take 1 capsule by mouth daily.    . pantoprazole (PROTONIX) 40 MG tablet Take 40 mg by mouth daily.    . Polyvinyl Alcohol-Povidone (CLEAR EYES ALL SEASONS) 5-6 MG/ML SOLN Apply 1 drop to eye every morning.     . predniSONE (DELTASONE) 5 MG tablet Take 5 mg by mouth daily with breakfast.    . raloxifene (EVISTA) 60 MG tablet Take 60 mg by mouth every morning.    . tamoxifen (NOLVADEX) 20 MG tablet Take 1 tablet (20 mg total) by mouth daily. 30 tablet 3   No current facility-administered medications for this visit.     Review of Systems  Constitutional: Negative for fever, chills, weight loss, malaise/fatigue and diaphoresis.  HENT: Positive for hearing loss. Negative for congestion, ear discharge, ear pain, nosebleeds, sore throat and tinnitus.   Eyes: Negative.   Respiratory: Negative.  Negative for stridor.   Cardiovascular: Negative.   Genitourinary: Negative.     Musculoskeletal: Positive for myalgias. Negative for back pain, joint pain and neck pain.       Not ambulatory  Skin: Negative.   Neurological: Positive for weakness. Negative for dizziness, tingling, tremors, sensory change, speech change, focal weakness, seizures, loss of consciousness and headaches.  Endo/Heme/Allergies: Negative.   Psychiatric/Behavioral: Negative.   All other systems reviewed and are negative.  14 point ROS was done and is otherwise as detailed above or in HPI   PHYSICAL EXAMINATION: ECOG PERFORMANCE STATUS: 2 - Symptomatic, <50% confined to bed  Vitals with BMI 05/26/2016  Height   Weight (No Data)  BMI  Systolic 371  Diastolic 66  Pulse 97  Respirations 16    Physical Exam  Constitutional: She is oriented to person, place, and time. No distress.  Frail appearing, in wheelchair, appropriate, "hunched over" (significant kyphosis) she is unable to raise her head. Joint deformity of hands noted  HENT:  Head: Normocephalic and atraumatic.  Right Ear: External ear normal.  Left Ear: External ear normal.  Mouth/Throat: Oropharynx is clear and moist.  Eyes: Conjunctivae and EOM are normal. Pupils are equal, round, and reactive to light.  Neck: Normal range of motion. Neck supple. No tracheal deviation present. No thyromegaly present.  Cardiovascular: Normal rate and regular rhythm.  Exam reveals no gallop and no friction rub.   Pulmonary/Chest: Effort normal and breath sounds normal. No respiratory distress. She has no wheezes. She has no rales.  Breast exam: Exam performed in wheelchair. Bilateral mastectomies, no palpable abnormalities, no axillary adenopathy. Abdominal: Soft. She exhibits no distension and no mass. There is no tenderness. There is no rebound and no guarding.  Musculoskeletal: She exhibits no edema.  Lymphadenopathy:    She has no cervical adenopathy.  Neurological: She is alert and oriented to person, place, and time. No cranial nerve  deficit.  Skin: Skin is warm and dry. She is not diaphoretic.  Psychiatric: Mood, memory, affect and judgment normal.  Nursing note and vitals reviewed.   LABORATORY DATA:  I have reviewed the data as listed Results for MAUDELL, STANBROUGH (MRN [id number]) as of 05/26/2016 11:56  Ref. Range 05/26/2016 10:06  Sodium Latest Ref Range: 135 - 145 mmol/L 136  Potassium Latest Ref Range: 3.5 - 5.1 mmol/L 4.1  Chloride Latest Ref Range: 101 - 111 mmol/L 100 (L)  CO2 Latest Ref Range: 22 - 32 mmol/L 29  BUN Latest Ref Range: 6 - 20 mg/dL 20  Creatinine Latest Ref Range: 0.44 - 1.00 mg/dL 1.04 (H)  Calcium Latest Ref Range: 8.9 - 10.3 mg/dL 8.9  EGFR (Non-African Amer.) Latest Ref Range: >60 mL/min 47 (L)  EGFR (African American) Latest Ref Range: >60 mL/min 54 (L)  Glucose Latest Ref Range: 65 - 99 mg/dL 197 (H)  Anion gap Latest Ref Range: 5 - 15  7  Alkaline Phosphatase Latest Ref Range: 38 - 126 U/L 51  Albumin Latest Ref Range: 3.5 - 5.0 g/dL 2.9 (L)  AST Latest Ref Range: 15 - 41 U/L 19  ALT Latest Ref Range: 14 - 54 U/L 14  Total Protein Latest Ref Range: 6.5 - 8.1 g/dL 6.4 (L)  Total Bilirubin Latest Ref Range: 0.3 - 1.2 mg/dL 0.3  WBC Latest Ref Range: 4.0 - 10.5 K/uL 12.7 (H)  RBC Latest Ref Range: 3.87 - 5.11 MIL/uL 3.87  Hemoglobin Latest Ref Range: 12.0 - 15.0 g/dL 11.0 (L)  HCT Latest Ref Range: 36.0 - 46.0 % 36.0  MCV Latest Ref Range: 78.0 - 100.0 fL 93.0  MCH Latest Ref Range: 26.0 - 34.0 pg 28.4  MCHC Latest Ref Range: 30.0 - 36.0 g/dL 30.6  RDW Latest Ref Range: 11.5 - 15.5 % 14.1  Platelets Latest Ref Range: 150 - 400 K/uL 279  Neutrophils Latest Units: % 72  Lymphocytes Latest Units: % 22  Monocytes Relative Latest Units: % 5  Eosinophil Latest Units: % 1  Basophil Latest Units: % 0  NEUT# Latest Ref Range: 1.7 - 7.7 K/uL 9.0 (H)  Lymphocyte # Latest Ref Range: 0.7 - 4.0 K/uL 2.8  Monocyte # Latest Ref Range: 0.1 - 1.0 K/uL 0.7  Eosinophils Absolute Latest Ref Range:  0.0 - 0.7 K/uL 0.2  Basophils Absolute Latest Ref Range: 0.0 - 0.1 K/uL 0.0  FINAL DIAGNOSIS Diagnosis 1. Breast, simple mastectomy, Left - INVASIVE GRADE III DUCTAL CARCINOMA, SPANNING 4.8 CM IN GREATEST DIMENSION. - MARGINS ARE NEGATIVE. - SEE ONCOLOGY TEMPLATE. 2. Lymph nodes, regional resection, Left axillary contents - FIVE BENIGN LYMPH NODES WITH NO TUMOR SEEN (0/5).   BREAST, INVASIVE TUMOR, WITH LYMPH NODES PRESENT Specimen, including laterality and lymph node sampling (sentinel, non-sentinel): Left breast with left axillary contents. Procedure: Left mastectomy with left axillary excision. Histologic type: Invasive ductal carcinoma. Grade: 3. Tubule formation: 3. Nuclear pleomorphism: 2. Mitotic: 3. Tumor size (gross measurement): 4.8 cm. Margins: Invasive, distance to closest margin: 0.2 cm (deep margin). Lymphovascular invasion: Definitive lymph/vascular invasion is not identified on the current specimen; however, lymph/vascular invasion was reported on the previous needle core biopsy ([id number]). Ductal carcinoma in situ: Not identified. Lobular neoplasia: Not identified. Tumor focality: Unifocal. Treatment effect: Not applicable. Extent of tumor: Skin: Tumor directly extends into dermis. Nipple: Not involved. Skeletal muscle: Not received. Lymph nodes: Examined: 0 Sentinel. 5 Non-sentinel. 5 Total. Lymph nodes with metastasis: 0. Isolated tumor cells (< 0.2 mm): 0. 2 of 4 FINAL for [facility], Jeylin ([id number]) Microscopic Comment(continued) Micrometastasis: (> 0.2 mm and < 2.0 mm): 0. Macrometastasis: (> 2.0 mm): 0. Extracapsular extension: Not applicable. Breast prognostic profile: Performed on previous case [id number] Estrogen receptor: 0%, negative. Progesterone receptor: 1%, positive. Her 2 neu: 1.54 ratio, negative. Ki-67: 30%. Non-neoplastic breast: Unremarkable. TNM: pT2, pN0. Comments: As both Her-2 neu and estrogen receptor were  previously reported as negative, a quantitative estrogen receptor and a Her-2 neu will be repeated on representative tumor to be reported in an addendum to follow. (RH:kh 05-11-15) Willeen Niece MD Pathologist, Electronic Signature (Case signed 05/11/2015) Specimen Gross and Clinical Information Breast prognostic profile: Performed on previous case [id number] Estrogen receptor: 0%, negative. Progesterone receptor: 1%, positive. Her 2 neu: 1.54 ratio, negative. Ki-67: 30%. Non-neoplastic breast: Unremarkable. TNM: pT2, pN0. Comments: As both Her-2 neu and estrogen receptor were previously reported as negative, a quantitative estrogen receptor and a Her-2 neu will be repeated on representative tumor to be reported in an addendum to follow. 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  The patient and family would like to continue with ongoing follow-up. If she needs refills, her family knows to let us know. Labs were reviewed.  She has multiple medical issues we discussed these today, for now they would like to continue with tamoxifen.  I think she should keep an eye on her chest and if anything comes up, notify the [facility]. Exam today was benign.  All questions were answered. The patient knows to call the clinic with any problems, questions or concerns.  She will return for follow up in 4 months.   All questions were answered. The patient knows to call the clinic with any problems, questions or concerns.   This note was electronically signed.   This document serves as a record of services personally performed by Ancil Linsey, MD.  It was created on her behalf by Arlyce Harman, a trained medical scribe. The creation of  this record is based on the scribe's personal observations and the provider's statements to them. This document has been checked and approved by the attending provider.  I have reviewed the above documentation for accuracy and completeness, and I agree with the above.  Kelby Fam. Whitney Muse, MD

## 2016-05-26 NOTE — Patient Instructions (Signed)
[facility] at [facility] Discharge Instructions  RECOMMENDATIONS MADE BY THE CONSULTANT AND ANY TEST RESULTS WILL BE SENT TO YOUR REFERRING PHYSICIAN.  You saw Dr. Whitney Muse today. Follow up in 4 months with doctor with labs. Continue Tamoxifen  Thank you for choosing [place] at [facility] to provide your oncology and hematology care.  To afford each patient quality time with our provider, please arrive at least 15 minutes before your scheduled appointment time.   Beginning January 23rd 2017 lab work for the [facility] will be done in the  Main lab at [facility] on 1st floor. If you have a lab appointment with the [facility] please come in thru the  Main Entrance and check in at the main information desk  You need to re-schedule your appointment should you arrive 10 or more minutes late.  We strive to give you quality time with our providers, and arriving late affects you and other patients whose appointments are after yours.  Also, if you no show three or more times for appointments you may be dismissed from the clinic at the providers discretion.     Again, thank you for choosing [facility].  Our hope is that these requests will decrease the amount of time that you wait before being seen by our physicians.       _____________________________________________________________  Should you have questions after your visit to [facility], please contact our office at [phone] between the hours of 8:30 a.m. and 4:30 p.m.  Voicemails left after 4:30 p.m. will not be returned until the following business day.  For prescription refill requests, have your pharmacy contact our office.         Resources For Cancer Patients and their Caregivers ? American Cancer Society: Can assist with transportation, wigs, general needs, runs Look Good Feel Better.        579-270-0930 ? Cancer Care: Provides financial  assistance, online support groups, medication/co-pay assistance.  1-800-813-HOPE 321-863-1472) ? [place] Assists [place] cancer patients and their families through emotional , educational and financial support.  320 773 2443 ? [place] DSS Where to apply for food stamps, Medicaid and utility assistance. (231)830-4304 ? [facility]: Transportation to medical appointments. (807)339-6375 ? Social Security Administration: May apply for disability if have a Stage IV cancer. (682) 185-6792 801 717 2841 ? [employer], Disability and Transit Services: Assists with nutrition, care and transit needs. [facility] Support Programs: @10RELATIVEDAYS @ > Cancer Support Group  2nd Tuesday of the month 1pm-2pm, Journey Room  > Creative Journey  3rd Tuesday of the month 1130am-1pm, Journey Room  > Look Good Feel Better  1st Wednesday of the month 10am-12 noon, Journey Room (Call [facility] to register [phone])

## 2016-07-19 ENCOUNTER — Other Ambulatory Visit (HOSPITAL_COMMUNITY): Payer: Self-pay

## 2016-07-19 ENCOUNTER — Encounter (HOSPITAL_COMMUNITY): Payer: Self-pay

## 2016-07-19 ENCOUNTER — Other Ambulatory Visit: Payer: Self-pay

## 2016-07-19 ENCOUNTER — Emergency Department (HOSPITAL_COMMUNITY): Payer: Medicare Other

## 2016-07-19 ENCOUNTER — Inpatient Hospital Stay (HOSPITAL_COMMUNITY)
Admission: EM | Admit: 2016-07-19 | Discharge: 2016-07-23 | DRG: 871 | Disposition: A | Discharge status: Home / Self Care | Payer: Medicare Other | Attending: Internal Medicine | Admitting: Internal Medicine

## 2016-07-19 DIAGNOSIS — K219 Gastro-esophageal reflux disease without esophagitis: Secondary | ICD-10-CM | POA: Diagnosis present

## 2016-07-19 DIAGNOSIS — Z79899 Other long term (current) drug therapy: Secondary | ICD-10-CM

## 2016-07-19 DIAGNOSIS — Z853 Personal history of malignant neoplasm of breast: Secondary | ICD-10-CM

## 2016-07-19 DIAGNOSIS — Z8711 Personal history of peptic ulcer disease: Secondary | ICD-10-CM

## 2016-07-19 DIAGNOSIS — L89103 Pressure ulcer of unspecified part of back, stage 3: Secondary | ICD-10-CM | POA: Diagnosis present

## 2016-07-19 DIAGNOSIS — Z7982 Long term (current) use of aspirin: Secondary | ICD-10-CM

## 2016-07-19 DIAGNOSIS — I7300 Raynaud's syndrome without gangrene: Secondary | ICD-10-CM | POA: Diagnosis present

## 2016-07-19 DIAGNOSIS — M40209 Unspecified kyphosis, site unspecified: Secondary | ICD-10-CM | POA: Diagnosis present

## 2016-07-19 DIAGNOSIS — R509 Fever, unspecified: Secondary | ICD-10-CM

## 2016-07-19 DIAGNOSIS — Z87440 Personal history of urinary (tract) infections: Secondary | ICD-10-CM

## 2016-07-19 DIAGNOSIS — Z886 Allergy status to analgesic agent status: Secondary | ICD-10-CM

## 2016-07-19 DIAGNOSIS — R739 Hyperglycemia, unspecified: Secondary | ICD-10-CM

## 2016-07-19 DIAGNOSIS — J181 Lobar pneumonia, unspecified organism: Secondary | ICD-10-CM

## 2016-07-19 DIAGNOSIS — E119 Type 2 diabetes mellitus without complications: Secondary | ICD-10-CM

## 2016-07-19 DIAGNOSIS — L8990 Pressure ulcer of unspecified site, unspecified stage: Secondary | ICD-10-CM | POA: Diagnosis present

## 2016-07-19 DIAGNOSIS — Z7952 Long term (current) use of systemic steroids: Secondary | ICD-10-CM | POA: Diagnosis not present

## 2016-07-19 DIAGNOSIS — I10 Essential (primary) hypertension: Secondary | ICD-10-CM | POA: Diagnosis present

## 2016-07-19 DIAGNOSIS — L89159 Pressure ulcer of sacral region, unspecified stage: Secondary | ICD-10-CM

## 2016-07-19 DIAGNOSIS — L89153 Pressure ulcer of sacral region, stage 3: Secondary | ICD-10-CM | POA: Diagnosis present

## 2016-07-19 DIAGNOSIS — J189 Pneumonia, unspecified organism: Secondary | ICD-10-CM | POA: Diagnosis present

## 2016-07-19 DIAGNOSIS — N390 Urinary tract infection, site not specified: Secondary | ICD-10-CM | POA: Diagnosis present

## 2016-07-19 DIAGNOSIS — A419 Sepsis, unspecified organism: Secondary | ICD-10-CM | POA: Diagnosis not present

## 2016-07-19 DIAGNOSIS — E1121 Type 2 diabetes mellitus with diabetic nephropathy: Secondary | ICD-10-CM

## 2016-07-19 DIAGNOSIS — Z7951 Long term (current) use of inhaled steroids: Secondary | ICD-10-CM

## 2016-07-19 DIAGNOSIS — R32 Unspecified urinary incontinence: Secondary | ICD-10-CM | POA: Diagnosis present

## 2016-07-19 DIAGNOSIS — E872 Acidosis, unspecified: Secondary | ICD-10-CM

## 2016-07-19 DIAGNOSIS — M069 Rheumatoid arthritis, unspecified: Secondary | ICD-10-CM | POA: Diagnosis present

## 2016-07-19 DIAGNOSIS — Z7984 Long term (current) use of oral hypoglycemic drugs: Secondary | ICD-10-CM | POA: Diagnosis not present

## 2016-07-19 DIAGNOSIS — Z23 Encounter for immunization: Secondary | ICD-10-CM

## 2016-07-19 DIAGNOSIS — E1165 Type 2 diabetes mellitus with hyperglycemia: Secondary | ICD-10-CM | POA: Diagnosis present

## 2016-07-19 DIAGNOSIS — N3000 Acute cystitis without hematuria: Secondary | ICD-10-CM

## 2016-07-19 HISTORY — DX: Diverticulitis of intestine, part unspecified, without perforation or abscess without bleeding: K57.92

## 2016-07-19 LAB — CBC WITH DIFFERENTIAL/PLATELET
BASOS ABS: 0 10*3/uL (ref 0.0–0.1)
BASOS PCT: 0 %
EOS ABS: 0 10*3/uL (ref 0.0–0.7)
Eosinophils Relative: 0 %
HCT: 35.7 % — ABNORMAL LOW (ref 36.0–46.0)
HEMOGLOBIN: 11 g/dL — AB (ref 12.0–15.0)
Lymphocytes Relative: 13 %
Lymphs Abs: 1.9 10*3/uL (ref 0.7–4.0)
MCH: 28.6 pg (ref 26.0–34.0)
MCHC: 30.8 g/dL (ref 30.0–36.0)
MCV: 92.7 fL (ref 78.0–100.0)
Monocytes Absolute: 0.5 10*3/uL (ref 0.1–1.0)
Monocytes Relative: 4 %
NEUTROS PCT: 83 %
Neutro Abs: 12 10*3/uL — ABNORMAL HIGH (ref 1.7–7.7)
PLATELETS: 351 10*3/uL (ref 150–400)
RBC: 3.85 MIL/uL — AB (ref 3.87–5.11)
RDW: 13.2 % (ref 11.5–15.5)
WBC: 14.4 10*3/uL — AB (ref 4.0–10.5)

## 2016-07-19 LAB — I-STAT CG4 LACTIC ACID, ED
Lactic Acid, Venous: 2.93 mmol/L (ref 0.5–1.9)
Lactic Acid, Venous: 4.05 mmol/L (ref 0.5–1.9)

## 2016-07-19 LAB — COMPREHENSIVE METABOLIC PANEL
ALBUMIN: 2.9 g/dL — AB (ref 3.5–5.0)
ALK PHOS: 70 U/L (ref 38–126)
ALT: 17 U/L (ref 14–54)
ANION GAP: 7 (ref 5–15)
AST: 20 U/L (ref 15–41)
BILIRUBIN TOTAL: 0.3 mg/dL (ref 0.3–1.2)
BUN: 16 mg/dL (ref 6–20)
CALCIUM: 9.3 mg/dL (ref 8.9–10.3)
CO2: 29 mmol/L (ref 22–32)
Chloride: 97 mmol/L — ABNORMAL LOW (ref 101–111)
Creatinine, Ser: 0.95 mg/dL (ref 0.44–1.00)
GFR calc Af Amer: >60 mL/min (ref >60)
GFR, EST NON AFRICAN AMERICAN: 52 mL/min — AB (ref >60)
GLUCOSE: 223 mg/dL — AB (ref 65–99)
Potassium: 4.8 mmol/L (ref 3.5–5.1)
Sodium: 133 mmol/L — ABNORMAL LOW (ref 135–145)
TOTAL PROTEIN: 6.7 g/dL (ref 6.5–8.1)

## 2016-07-19 LAB — URINALYSIS, ROUTINE W REFLEX MICROSCOPIC
Bilirubin Urine: NEGATIVE
Glucose, UA: 250 mg/dL — AB
KETONES UR: NEGATIVE mg/dL
Nitrite: NEGATIVE
PROTEIN: NEGATIVE mg/dL
Specific Gravity, Urine: <1.005 — ABNORMAL LOW (ref 1.005–1.030)
pH: 5.5 (ref 5.0–8.0)

## 2016-07-19 LAB — GLUCOSE, CAPILLARY: Glucose-Capillary: 180 mg/dL — ABNORMAL HIGH (ref 65–99)

## 2016-07-19 LAB — URINE MICROSCOPIC-ADD ON

## 2016-07-19 LAB — TROPONIN I

## 2016-07-19 MED ORDER — POLYVINYL ALCOHOL-POVIDONE 5-6 MG/ML OP SOLN
1.0000 [drp] | Freq: Every morning | OPHTHALMIC | Status: DC
  Filled 2016-07-19 (×2): qty 15

## 2016-07-19 MED ORDER — MAGNESIUM OXIDE 400 (241.3 MG) MG PO TABS
200.0000 mg | ORAL_TABLET | Freq: Every day | ORAL | Status: DC
  Administered 2016-07-20 – 2016-07-23 (×4): 200 mg via ORAL
  Filled 2016-07-19 (×4): qty 1

## 2016-07-19 MED ORDER — DEXTROSE 5 % IV SOLN
500.0000 mg | INTRAVENOUS | Status: DC
  Administered 2016-07-20 – 2016-07-22 (×3): 500 mg via INTRAVENOUS
  Filled 2016-07-19 (×4): qty 500

## 2016-07-19 MED ORDER — AZITHROMYCIN 500 MG IV SOLR
500.0000 mg | Freq: Once | INTRAVENOUS | Status: AC
  Administered 2016-07-19: 500 mg via INTRAVENOUS
  Filled 2016-07-19: qty 500

## 2016-07-19 MED ORDER — SODIUM CHLORIDE 0.9 % IV SOLN
Freq: Once | INTRAVENOUS | Status: AC
  Administered 2016-07-19: 21:00:00 via INTRAVENOUS

## 2016-07-19 MED ORDER — RALOXIFENE HCL 60 MG PO TABS
60.0000 mg | ORAL_TABLET | Freq: Every morning | ORAL | Status: DC
  Administered 2016-07-20 – 2016-07-23 (×4): 60 mg via ORAL
  Filled 2016-07-19 (×4): qty 1

## 2016-07-19 MED ORDER — SODIUM CHLORIDE 0.9 % IV SOLN
Freq: Once | INTRAVENOUS | Status: AC
  Administered 2016-07-19: 20:00:00 via INTRAVENOUS

## 2016-07-19 MED ORDER — DEXTROSE 5 % IV SOLN
1.0000 g | Freq: Once | INTRAVENOUS | Status: AC
  Administered 2016-07-19: 1 g via INTRAVENOUS
  Filled 2016-07-19: qty 10

## 2016-07-19 MED ORDER — LINAGLIPTIN 5 MG PO TABS
5.0000 mg | ORAL_TABLET | Freq: Every morning | ORAL | Status: DC
  Administered 2016-07-20 – 2016-07-23 (×4): 5 mg via ORAL
  Filled 2016-07-19 (×4): qty 1

## 2016-07-19 MED ORDER — BENZONATATE 100 MG PO CAPS: 100.0000 mg | ORAL_CAPSULE | Freq: Three times a day (TID) | ORAL | Status: DC | PRN

## 2016-07-19 MED ORDER — DEXTROSE 5 % IV SOLN
1.0000 g | INTRAVENOUS | Status: DC
  Administered 2016-07-20 – 2016-07-22 (×3): 1 g via INTRAVENOUS
  Filled 2016-07-19 (×4): qty 10

## 2016-07-19 MED ORDER — PREDNISONE 10 MG PO TABS
5.0000 mg | ORAL_TABLET | Freq: Every day | ORAL | Status: DC
  Administered 2016-07-20: 5 mg via ORAL
  Filled 2016-07-19: qty 1

## 2016-07-19 MED ORDER — METFORMIN HCL 500 MG PO TABS
500.0000 mg | ORAL_TABLET | Freq: Two times a day (BID) | ORAL | Status: DC
  Administered 2016-07-20 – 2016-07-23 (×7): 500 mg via ORAL
  Filled 2016-07-19 (×7): qty 1

## 2016-07-19 MED ORDER — INSULIN ASPART 100 UNIT/ML ~~LOC~~ SOLN
0.0000 [IU] | Freq: Every day | SUBCUTANEOUS | Status: DC
  Administered 2016-07-21: 3 [IU] via SUBCUTANEOUS

## 2016-07-19 MED ORDER — IPRATROPIUM BROMIDE 0.02 % IN SOLN: 500.0000 ug | Freq: Four times a day (QID) | RESPIRATORY_TRACT | Status: DC | PRN

## 2016-07-19 MED ORDER — TRAMADOL HCL 50 MG PO TABS
50.0000 mg | ORAL_TABLET | Freq: Four times a day (QID) | ORAL | Status: DC | PRN
  Administered 2016-07-20: 50 mg via ORAL
  Filled 2016-07-19: qty 1

## 2016-07-19 MED ORDER — ALBUTEROL SULFATE (2.5 MG/3ML) 0.083% IN NEBU
2.5000 mg | INHALATION_SOLUTION | Freq: Four times a day (QID) | RESPIRATORY_TRACT | Status: DC | PRN
Start: 2016-07-19 — End: 2016-07-20

## 2016-07-19 MED ORDER — ENOXAPARIN SODIUM 40 MG/0.4ML ~~LOC~~ SOLN
40.0000 mg | SUBCUTANEOUS | Status: DC
  Administered 2016-07-19 – 2016-07-22 (×4): 40 mg via SUBCUTANEOUS
  Filled 2016-07-19 (×4): qty 0.4

## 2016-07-19 MED ORDER — TAMOXIFEN CITRATE 10 MG PO TABS
20.0000 mg | ORAL_TABLET | Freq: Every day | ORAL | Status: DC
  Administered 2016-07-20 – 2016-07-23 (×4): 20 mg via ORAL
  Filled 2016-07-19 (×4): qty 2

## 2016-07-19 MED ORDER — PANTOPRAZOLE SODIUM 40 MG PO TBEC
40.0000 mg | DELAYED_RELEASE_TABLET | Freq: Every day | ORAL | Status: DC
Start: 2016-07-20 — End: 2016-07-23
  Administered 2016-07-20 – 2016-07-23 (×4): 40 mg via ORAL
  Filled 2016-07-19 (×4): qty 1

## 2016-07-19 MED ORDER — INFLUENZA VAC SPLIT QUAD 0.5 ML IM SUSY
0.5000 mL | PREFILLED_SYRINGE | INTRAMUSCULAR | Status: AC
  Administered 2016-07-20: 0.5 mL via INTRAMUSCULAR

## 2016-07-19 MED ORDER — INSULIN ASPART 100 UNIT/ML ~~LOC~~ SOLN
0.0000 [IU] | Freq: Three times a day (TID) | SUBCUTANEOUS | Status: DC
  Administered 2016-07-20: 5 [IU] via SUBCUTANEOUS
  Administered 2016-07-20: 2 [IU] via SUBCUTANEOUS
  Administered 2016-07-21 (×2): 5 [IU] via SUBCUTANEOUS
  Administered 2016-07-21: 8 [IU] via SUBCUTANEOUS
  Administered 2016-07-22 (×2): 3 [IU] via SUBCUTANEOUS
  Administered 2016-07-22: 8 [IU] via SUBCUTANEOUS
  Administered 2016-07-23: 11 [IU] via SUBCUTANEOUS

## 2016-07-19 MED ORDER — ALBUTEROL SULFATE (2.5 MG/3ML) 0.083% IN NEBU: 3.0000 mL | INHALATION_SOLUTION | Freq: Four times a day (QID) | RESPIRATORY_TRACT | Status: DC | PRN

## 2016-07-19 MED ORDER — ATORVASTATIN CALCIUM 20 MG PO TABS
20.0000 mg | ORAL_TABLET | Freq: Every day | ORAL | Status: DC
  Administered 2016-07-19 – 2016-07-22 (×4): 20 mg via ORAL
  Filled 2016-07-19 (×4): qty 1

## 2016-07-19 MED ORDER — ASPIRIN EC 81 MG PO TBEC
81.0000 mg | DELAYED_RELEASE_TABLET | Freq: Every day | ORAL | Status: DC
  Administered 2016-07-20 – 2016-07-23 (×4): 81 mg via ORAL
  Filled 2016-07-19 (×4): qty 1

## 2016-07-19 NOTE — H&P (Signed)
History and Physical  [name] [id number] DOB: [birth year] DOA: 07/19/2016  Referring physician: Dr Jeanell Sparrow, ED physician PCP: Moshe Cipro, MD  Outpatient Specialists:   Alroy Dust (cardiologist)  O'Neil (pulmonology)  Chief Complaint: Fever, confusion, abdominal pain  HPI: [name] is a 82 y.o. female with a history of breast cancer, anemia, acid reflux, diabetes, hypertension, rheumatoid arthritis, kyphosis. Patient with history of UTIs. Patient has been hospitalized twice in the past 6 months for UTI with sepsis. Started having abdominal pain 2-3 days ago, then began to have some mild confusion with a fever today. Patient's daughters brought her here for evaluation. No palliating or provoking factors.  Emergency department course:  Patient's blood pressure was initially 112/97, with some blood pressures in 0000000 systolically. Was found to have a white count of 14, lactic acidosis. Urinalysis concerning for UTI and chest x-ray concerning for early pneumonia. Patient received Zithromax and Rocephin, as well as 30 mL/kg fluid bolus   Review of Systems:   Patient has chronic dry cough  Pt denies any chills, nausea, vomiting, diarrhea, constipation, shortness of breath, dyspnea on exertion, orthopnea, cough, wheezing, palpitations, headache, vision changes, lightheadedness, dizziness, melena, rectal bleeding.  Review of systems are otherwise negative  Past Medical History:  Diagnosis Date  . Acid reflux   . Anemia   . Arthritis    ra  . Asthma   . Cancer [facility])    breast cancer  . Diabetes mellitus without complication ([place])   . Gastric ulcer   . Hypertension   . Kyphosis   . Raynaud disease    Past Surgical History:  Procedure Laterality Date  . BREAST SURGERY    . HERNIA REPAIR    . MASTECTOMY Right 1978  . SIMPLE MASTECTOMY WITH AXILLARY SENTINEL NODE BIOPSY Left 05/09/2015   Procedure: LEFT SIMPLE MASTECTOMY;  Surgeon: Erroll Luna, MD;  Location: [place];   Service: General;  Laterality: Left;   Social History:  reports that she has never smoked. She has never used smokeless tobacco. She reports that she does not drink alcohol or use drugs. Patient lives at [place]  . Aspirin Other (See Comments)    Cannot take uncoated due to stomach ulcers    Family History  Problem Relation Age of Onset  . Asthma Sister   . Rheum arthritis Daughter      Prior to Admission medications   Medication Sig Start Date End Date Taking? Authorizing Provider  albuterol (PROVENTIL HFA) 108 (90 BASE) MCG/ACT inhaler Inhale 2 puffs into the lungs every 6 (six) hours as needed for shortness of breath.    Yes Historical Provider, MD  albuterol (PROVENTIL) (2.5 MG/3ML) 0.083% nebulizer solution Take 2.5 mg by nebulization daily. With Ipratropium as needed for shortness of breath   Yes Historical Provider, MD  aspirin EC 81 MG tablet Take 81 mg by mouth daily.    Yes Historical Provider, MD  atorvastatin (LIPITOR) 20 MG tablet Take 20 mg by mouth at bedtime.   Yes Historical Provider, MD  benzonatate (TESSALON) 100 MG capsule Take 1 capsule (100 mg total) by mouth 3 (three) times daily as needed for cough. 04/15/16  Yes Reyne Dumas, MD  budesonide-formoterol (SYMBICORT) 160-4.5 MCG/ACT inhaler Inhale 2 puffs into the lungs 2 (two) times daily.   Yes Historical Provider, MD  Chlorpheniramine-Acetaminophen (CORICIDIN HBP COLD/FLU PO) Take 1 capsule by mouth every 6 (six) hours as needed (for cold symptoms).    Yes Historical Provider, MD  Cholecalciferol (VITAMIN D3) 1000 UNITS CAPS Take 2,000 Units by mouth daily.    Yes Historical Provider, MD  ciclopirox (PENLAC) 8 % solution APPLY TO FUNGAL TOENAILS EVERY DAY 02/11/16  Yes Historical Provider, MD  ipratropium (ATROVENT) 0.02 % nebulizer solution Take 500 mcg by nebulization daily. With albuterol as needed for shortness of breath   Yes Historical Provider, MD  lactobacillus acidophilus & bulgar  (LACTINEX) chewable tablet CHEW 1 TABLET BY MOUTH EVERY DAY 09/17/15  Yes Historical Provider, MD  linagliptin (TRADJENTA) 5 MG TABS tablet Take 5 mg by mouth every morning.   Yes Historical Provider, MD  magnesium oxide (MAG-OX) 400 (241.3 Mg) MG tablet Take 0.5 tablets (200 mg total) by mouth daily. 04/15/16  Yes Reyne Dumas, MD  metFORMIN (GLUCOPHAGE) 500 MG tablet Take 500 mg by mouth 2 (two) times daily with a meal.    Yes Historical Provider, MD  metoprolol succinate (TOPROL XL) 25 MG 24 hr tablet Take 1 tablet (25 mg total) by mouth daily. Patient taking differently: Take 25 mg by mouth daily. *Take one-half tablet if BP levels are <90/50 and symptomatic* 10/12/15  Yes Nita Sells, MD  Multiple Vitamins-Minerals (CENTRUM SILVER ADULT 50+ PO) Take 1 tablet by mouth every morning.    Yes Historical Provider, MD  pantoprazole (PROTONIX) 40 MG tablet Take 40 mg by mouth daily.   Yes Historical Provider, MD  Polyvinyl Alcohol-Povidone (CLEAR EYES ALL SEASONS) 5-6 MG/ML SOLN Apply 1 drop to eye every morning.    Yes Historical Provider, MD  predniSONE (DELTASONE) 5 MG tablet Take 5 mg by mouth daily with breakfast.   Yes Historical Provider, MD  raloxifene (EVISTA) 60 MG tablet Take 60 mg by mouth every morning.   Yes Historical Provider, MD  tamoxifen (NOLVADEX) 20 MG tablet Take 1 tablet (20 mg total) by mouth daily. 04/21/16  Yes Manon Hilding Kefalas, PA-C  traMADol (ULTRAM) 50 MG tablet Take 50 mg by mouth every 6 (six) hours as needed for moderate pain.   Yes Historical Provider, MD    Physical Exam: BP 90/59   Pulse 83   Temp 99.8 F (37.7 C) (Rectal)   Resp 18   Ht 5\' 4"  (1.626 m)   Wt 48.1 kg (106 lb)   SpO2 100%   BMI 18.19 kg/m   General: Elderly black female. Awake and alert and oriented x3. No acute cardiopulmonary distress.  HEENT: Normocephalic atraumatic.  Right and left ears normal in appearance.  Pupils equal, round, reactive to light. Extraocular muscles are intact.  Sclerae anicteric and noninjected.  Moist mucosal membranes. No mucosal lesions.  Neck: Neck supple without lymphadenopathy. No carotid bruits. No masses palpated.  Cardiovascular: Regular rate with normal S1-S2 sounds. No murmurs, rubs, gallops auscultated. No JVD.  Respiratory: Shallow respiratory effort. No wheezes. Slight rales in the right lung field.  No accessory muscle use. Abdomen: Soft, nondistended. Suprapubic tenderness. No rebound or guarding. Active bowel sounds. No masses or hepatosplenomegaly  Skin: No rashes, lesions. Sacral decubitus ulcer. Dry, warm to touch. 2+ dorsalis pedis and radial pulses. Musculoskeletal: No calf or leg pain. All major joints not erythematous nontender.  Significant kyphosis and scoliosis. Joints consistent with RA. No contractures  Psychiatric: Intact judgment and insight. Pleasant and cooperative. Neurologic: No focal neurological deficits. Strength is 5/5 and symmetric in upper and lower extremities.  Cranial nerves II through XII are grossly intact.           Labs on Admission: I have personally reviewed  following labs and imaging studies  CBC:  Recent Labs Lab 07/19/16 1747  WBC 14.4*  NEUTROABS 12.0*  HGB 11.0*  HCT 35.7*  MCV 92.7  PLT [id number]   Basic Metabolic Panel:  Recent Labs Lab 07/19/16 1747  NA 133*  K 4.8  CL 97*  CO2 29  GLUCOSE 223*  BUN 16  CREATININE 0.95  CALCIUM 9.3   GFR: Estimated Creatinine Clearance: 31.1 mL/min (by C-G formula based on SCr of 0.95 mg/dL). Liver Function Tests:  Recent Labs Lab 07/19/16 1747  AST 20  ALT 17  ALKPHOS 70  BILITOT 0.3  PROT 6.7  ALBUMIN 2.9*   No results for input(s): LIPASE, AMYLASE in the last 168 hours. No results for input(s): AMMONIA in the last 168 hours. Coagulation Profile: No results for input(s): INR, PROTIME in the last 168 hours. Cardiac Enzymes:  Recent Labs Lab 07/19/16 1747  TROPONINI <0.03   BNP (last 3 results) No results for input(s):  PROBNP in the last 8760 hours. HbA1C: No results for input(s): HGBA1C in the last 72 hours. CBG: No results for input(s): GLUCAP in the last 168 hours. Lipid Profile: No results for input(s): CHOL, HDL, LDLCALC, TRIG, CHOLHDL, LDLDIRECT in the last 72 hours. Thyroid Function Tests: No results for input(s): TSH, T4TOTAL, FREET4, T3FREE, THYROIDAB in the last 72 hours. Anemia Panel: No results for input(s): VITAMINB12, FOLATE, FERRITIN, TIBC, IRON, RETICCTPCT in the last 72 hours. Urine analysis:    Component Value Date/Time   COLORURINE YELLOW 07/19/2016 1837   APPEARANCEUR CLOUDY (A) 07/19/2016 1837   LABSPEC <1.005 (L) 07/19/2016 1837   PHURINE 5.5 07/19/2016 1837   GLUCOSEU 250 (A) 07/19/2016 1837   HGBUR SMALL (A) 07/19/2016 1837   BILIRUBINUR NEGATIVE 07/19/2016 1837   KETONESUR NEGATIVE 07/19/2016 1837   PROTEINUR NEGATIVE 07/19/2016 1837   UROBILINOGEN 0.2 06/30/2015 1317   NITRITE NEGATIVE 07/19/2016 1837   LEUKOCYTESUR LARGE (A) 07/19/2016 1837   Sepsis Labs: @LABRCNTIP (procalcitonin:4,lacticidven:4) )No results found for this or any previous visit (from the past 240 hour(s)).   Radiological Exams on Admission: Dg Chest Portable 1 View  Result Date: 07/19/2016 CLINICAL DATA:  Acute onset of cough, fever and generalized chest pain. Initial encounter. EXAM: PORTABLE CHEST 1 VIEW COMPARISON:  Chest radiograph performed 04/13/2016 FINDINGS: The lungs are well-aerated. There is mild elevation of the left hemidiaphragm. The left lung apex is obscured by the patient's head. Mild right midlung opacity raises concern for pneumonia. There is no evidence of pleural effusion or pneumothorax. The cardiomediastinal silhouette is within normal limits. No acute osseous abnormalities are seen. Clips are seen overlying the left axilla. IMPRESSION: Mild right midlung airspace opacity raises concern for pneumonia. Mild elevation of the left hemidiaphragm. Electronically Signed   By: Garald Balding M.D.   On: 07/19/2016 19:31    EKG: Independently reviewed. Sinus rhythm with left ventricular hypertrophy. No acute ST changes  Assessment/Plan: Principal Problem:   Sepsis ([place]) Active Problems:   HTN (hypertension)   Diabetes type 2, controlled ([place])   UTI (urinary tract infection)   CAP (community acquired pneumonia)   Pressure ulcer   Lactic acidosis    This patient was discussed with the ED physician, including pertinent vitals, physical exam findings, labs, and imaging.  We also discussed care given by the ED provider.  #1 sepsis  Admit to stepdown  Blood cultures and urine cultures performed  Patient fluid resuscitated #2 UTI  Urine culture obtained  Rocephin #3 community acquired pneumonia  Sputum cultures  Continue Rocephin and azithromycin  Urine for Legionella and strep antigens  Repeat CBC tomorrow #4 pressure ulcer  Wound care consult #5 lactic acidosis  Repeat lactic acid to assure clearing #6 diabetes type 2  Continue metformin  CBGs before meals and daily at bedtime  Sliding-scale insulin #7 hypertension  Hold antihypertensives for now  DVT prophylaxis: Lovenox Consultants: Wound care Code Status: Full code Family Communication: Daughters in the room  Disposition Plan: Patient will likely be able to return home following admission   Truett Mainland, DO [facility] Pager [phone]  If 7PM-7AM, please contact night-coverage [email or web address] Password [email or web address]

## 2016-07-19 NOTE — ED Provider Notes (Signed)
[place] DEPT Provider Note   CSN: [id number] Arrival date & time: 07/19/16  [id number]   Lives with daughter History from daughter patient hoh  History   Chief Complaint Chief Complaint  Patient presents with  . Fever  . Chest Pain   Dr. Lovett Sox HPI Calani Bonton is a 82 y.o. female.h.o. Breast cancer, uti, anemia diabetes, asthma, presents today with fever.  Patient complained of pain to chest and abdomen to daughter.  Urine discolored yesterday.  Incontinent of urine but appears to have same amount out.  Po intake at baseline.  Some increased sleepiness.  No vomiting, diarrhea, cough at baseline.   HPI  Past Medical History:  Diagnosis Date  . Acid reflux   . Anemia   . Arthritis    ra  . Asthma   . Cancer [facility])    breast cancer  . Diabetes mellitus without complication ([place])   . Gastric ulcer   . Hypertension   . Kyphosis   . Raynaud disease     Patient Active Problem List   Diagnosis Date Noted  . Urinary tract infectious disease 04/12/2016  . Asthma, chronic 04/12/2016  . Sepsis ([place]) 03/08/2016  . CAP (community acquired pneumonia) 10/09/2015  . Pressure ulcer 10/09/2015  . Kyphosis   . UTI (lower urinary tract infection)   . Severe sepsis ([place]) 06/30/2015  . UTI (urinary tract infection) 06/30/2015  . Breast cancer of upper-outer quadrant of left female breast ([place]) 05/09/2015  . Rheumatoid arthritis ([place]) 10/29/2012  . HTN (hypertension) 10/29/2012  . Diabetes type 2, controlled ([place]) 10/29/2012  . GERD (gastroesophageal reflux disease) 10/29/2012    Past Surgical History:  Procedure Laterality Date  . BREAST SURGERY    . HERNIA REPAIR    . MASTECTOMY Right 1978  . SIMPLE MASTECTOMY WITH AXILLARY SENTINEL NODE BIOPSY Left 05/09/2015   Procedure: LEFT SIMPLE MASTECTOMY;  Surgeon: Erroll Luna, MD;  Location: [place];  Service: General;  Laterality: Left;    OB History    Gravida Para Term Preterm AB Living   4 4 4     4    SAB TAB  Ectopic Multiple Live Births                   Home Medications    Prior to Admission medications   Medication Sig Start Date End Date Taking? Authorizing Provider  albuterol (PROVENTIL HFA) 108 (90 BASE) MCG/ACT inhaler Inhale 2 puffs into the lungs every 6 (six) hours as needed for shortness of breath.     Historical Provider, MD  albuterol (PROVENTIL) (2.5 MG/3ML) 0.083% nebulizer solution Take 2.5 mg by nebulization daily. With Ipratropium as needed for shortness of breath    Historical Provider, MD  aspirin EC 81 MG tablet Take 81 mg by mouth daily.     Historical Provider, MD  atorvastatin (LIPITOR) 20 MG tablet Take 20 mg by mouth at bedtime.    Historical Provider, MD  azithromycin (ZITHROMAX) 250 MG tablet 6 days 04/15/16   Reyne Dumas, MD  benzonatate (TESSALON) 100 MG capsule Take 1 capsule (100 mg total) by mouth 3 (three) times daily as needed for cough. 04/15/16   Reyne Dumas, MD  budesonide-formoterol (SYMBICORT) 160-4.5 MCG/ACT inhaler Inhale 2 puffs into the lungs 2 (two) times daily.    Historical Provider, MD  cefdinir (OMNICEF) 300 MG capsule Take 1 capsule (300 mg total) by mouth 2 (two) times daily. 04/15/16   Reyne Dumas, MD  Chlorpheniramine-Acetaminophen (CORICIDIN HBP COLD/FLU  PO) Take 1 capsule by mouth every 6 (six) hours as needed (for cold symptoms).     Historical Provider, MD  Cholecalciferol (VITAMIN D3) 1000 UNITS CAPS Take 2,000 Units by mouth daily.     Historical Provider, MD  ciclopirox (PENLAC) 8 % solution APPLY TO FUNGAL TOENAILS EVERY DAY 02/11/16   Historical Provider, MD  ferrous sulfate 325 (65 FE) MG tablet Take 325 mg by mouth daily. 07/23/15   Historical Provider, MD  ipratropium (ATROVENT) 0.02 % nebulizer solution Take 500 mcg by nebulization daily. With albuterol as needed for shortness of breath    Historical Provider, MD  lactobacillus acidophilus & bulgar (LACTINEX) chewable tablet CHEW 1 TABLET BY MOUTH EVERY DAY 09/17/15   Historical  Provider, MD  linagliptin (TRADJENTA) 5 MG TABS tablet Take 5 mg by mouth every morning.    Historical Provider, MD  Magnesium ([employer] TRIPLE MAGNESIUM COMPLEX) 400 MG CAPS Take 1 capsule by mouth daily.    Historical Provider, MD  magnesium oxide (MAG-OX) 400 (241.3 Mg) MG tablet Take 0.5 tablets (200 mg total) by mouth daily. 04/15/16   Reyne Dumas, MD  metFORMIN (GLUCOPHAGE) 500 MG tablet Take 500-1,000 mg by mouth 2 (two) times daily. 1 in the morning and 2 at bedtime    Historical Provider, MD  metoprolol succinate (TOPROL XL) 25 MG 24 hr tablet Take 1 tablet (25 mg total) by mouth daily. 10/12/15   Nita Sells, MD  Multiple Vitamins-Minerals (CENTRUM SILVER ADULT 50+ PO) Take 1 tablet by mouth every morning.     Historical Provider, MD  Omega-3 Fatty Acids (FISH OIL) 1200 MG CAPS Take 1 capsule by mouth daily.    Historical Provider, MD  pantoprazole (PROTONIX) 40 MG tablet Take 40 mg by mouth daily.    Historical Provider, MD  Polyvinyl Alcohol-Povidone (CLEAR EYES ALL SEASONS) 5-6 MG/ML SOLN Apply 1 drop to eye every morning.     Historical Provider, MD  predniSONE (DELTASONE) 5 MG tablet Take 5 mg by mouth daily with breakfast.    Historical Provider, MD  raloxifene (EVISTA) 60 MG tablet Take 60 mg by mouth every morning.    Historical Provider, MD  tamoxifen (NOLVADEX) 20 MG tablet Take 1 tablet (20 mg total) by mouth daily. 04/21/16   Baird Cancer, PA-C    Family History Family History  Problem Relation Age of Onset  . Asthma Sister   . Rheum arthritis Daughter     Social History Social History  Substance Use Topics  . Smoking status: Never Smoker  . Smokeless tobacco: Never Used  . Alcohol use No     Allergies   Aspirin   Review of Systems Review of Systems  All other systems reviewed and are negative.    Physical Exam Updated Vital Signs BP 112/97 (BP Location: Right Arm)   Pulse 95   Temp 100.7 F (38.2 C) (Oral)   Resp 18   Ht 5\' 4"  (1.626 m)   Wt  47.2 kg   SpO2 93%   BMI 17.85 kg/m   Physical Exam  Constitutional: She is oriented to person, place, and time. She appears well-nourished. No distress.  Elderly chronically ill female nad  HENT:  Head: Normocephalic and atraumatic.  Right Ear: External ear normal.  Left Ear: External ear normal.  Nose: Nose normal.  Mouth/Throat: Oropharynx is clear and moist.  Eyes: EOM are normal. Pupils are equal, round, and reactive to light.  Neck:  Kyphosis and scoliosis  Cardiovascular: Normal rate  and regular rhythm.   Pulmonary/Chest: Effort normal and breath sounds normal. No respiratory distress. She has no wheezes. She has no rales.  Abdominal: Soft. Bowel sounds are normal. She exhibits no distension. There is no tenderness. There is no guarding.  Musculoskeletal: Normal range of motion. She exhibits no edema.  Chronic joint deformities c.w. ra  Neurological: She is alert and oriented to person, place, and time. She has normal reflexes.  Skin: Skin is warm and dry. Capillary refill takes less than 2 seconds.  Skin break down midback c.w. Pressure ulcer 3x2 cm  Psychiatric: She has a normal mood and affect.  Nursing note and vitals reviewed.    ED Treatments / Results  Labs (all labs ordered are listed, but only abnormal results are displayed) Labs Reviewed  CBC WITH DIFFERENTIAL/PLATELET - Abnormal; Notable for the following:       Result Value   WBC 14.4 (*)    RBC 3.85 (*)    Hemoglobin 11.0 (*)    HCT 35.7 (*)    Neutro Abs 12.0 (*)    All other components within normal limits  URINALYSIS, ROUTINE W REFLEX MICROSCOPIC (NOT AT [facility]) - Abnormal; Notable for the following:    APPearance CLOUDY (*)    Specific Gravity, Urine <1.005 (*)    Glucose, UA 250 (*)    Hgb urine dipstick SMALL (*)    Leukocytes, UA LARGE (*)    All other components within normal limits  COMPREHENSIVE METABOLIC PANEL - Abnormal; Notable for the following:    Sodium 133 (*)    Chloride 97 (*)      Glucose, Bld 223 (*)    Albumin 2.9 (*)    GFR calc non Af Amer 52 (*)    All other components within normal limits  URINE MICROSCOPIC-ADD ON - Abnormal; Notable for the following:    Squamous Epithelial / LPF 0-5 (*)    Bacteria, UA MANY (*)    All other components within normal limits  I-STAT CG4 LACTIC ACID, ED - Abnormal; Notable for the following:    Lactic Acid, Venous 2.93 (*)    All other components within normal limits  CULTURE, BLOOD (ROUTINE X 2)  CULTURE, BLOOD (ROUTINE X 2)  URINE CULTURE  TROPONIN I    EKG  EKG Interpretation  Date/Time:  Saturday July 19 2016 19:09:40 EDT Ventricular Rate:  85 PR Interval:    QRS Duration: 85 QT Interval:  378 QTC Calculation: 450 R Axis:   11 Text Interpretation:  Sinus rhythm Abnormal R-wave progression, early transition Left ventricular hypertrophy No significant change since last tracing Confirmed by Deadra Diggins MD, Andee Poles [phone]) on 07/19/2016 8:14:02 PM       Radiology Dg Chest Portable 1 View  Result Date: 07/19/2016 CLINICAL DATA:  Acute onset of cough, fever and generalized chest pain. Initial encounter. EXAM: PORTABLE CHEST 1 VIEW COMPARISON:  Chest radiograph performed 04/13/2016 FINDINGS: The lungs are well-aerated. There is mild elevation of the left hemidiaphragm. The left lung apex is obscured by the patient's head. Mild right midlung opacity raises concern for pneumonia. There is no evidence of pleural effusion or pneumothorax. The cardiomediastinal silhouette is within normal limits. No acute osseous abnormalities are seen. Clips are seen overlying the left axilla. IMPRESSION: Mild right midlung airspace opacity raises concern for pneumonia. Mild elevation of the left hemidiaphragm. Electronically Signed   By: Garald Balding M.D.   On: 07/19/2016 19:31    Procedures Procedures (including critical care time)  Medications Ordered in ED Medications - No data to display   Initial Impression / Assessment and  Plan / ED Course  I have reviewed the triage vital signs and the nursing notes.  Pertinent labs & imaging results that were available during my care of the patient were reviewed by me and considered in my medical decision making (see chart for details).  Clinical Course  82 y.o. Female presents with fever uti/ cap treated here with rocephin and zithromax.  Patient hemodynamically stable.Plan admission for ongoing abx.  1- fever- likely 2/2 cap vs ut, possible uti, lactic elevated, iv 30 cc.kg bolus infusin. Sepsis - Repeat Assessment  Performed at:    9:32 PM   Vitals     Blood pressure 90/59, pulse 83, temperature 99.8 F (37.7 C), temperature source Rectal, resp. rate 18, height 5\' 4"  (1.626 m), weight 48.1 kg, SpO2 100 %.  Heart:     Regular rate and rhythm  Lungs:    CTA  Capillary Refill:   <2 sec  Peripheral Pulse:   Radial pulse palpable  Skin:     Normal Color   2- hyperglycemia     Final Clinical Impressions(s) / ED Diagnoses   Final diagnoses:  Fever, unspecified fever cause  Community acquired pneumonia of right middle lobe of lung (HCC)  Hyperglycemia  Sepsis, due to unspecified organism [facility])   Discussed with Dr. Nehemiah Settle and he will admit to step down.  New Prescriptions New Prescriptions   No medications on file     Pattricia Boss, MD 07/19/16 2134

## 2016-07-19 NOTE — ED Notes (Signed)
Report given to floor, all questions answered

## 2016-07-19 NOTE — ED Notes (Signed)
Stage 2 sacrum ulcer present, patient and family aware

## 2016-07-19 NOTE — ED Triage Notes (Signed)
Daughters report pt has had fever, chest pain, and abd pain that started today.  Also report her urine is "milky white."  Pt had tylenol at 5pm.

## 2016-07-19 NOTE — ED Notes (Signed)
CRITICAL VALUE ALERT  Critical value received:  istat trop  Date of notification:  07/19/2016  Time of notification:  2008  Critical value read back:Yes.    Nurse who received alert:  Cordella Nyquist    Responding MD:  Jeanell Sparrow  Time MD responded:  2010

## 2016-07-19 NOTE — ED Notes (Signed)
Report called to [facility] on Dept 300, all questions answered

## 2016-07-20 LAB — MRSA PCR SCREENING: MRSA BY PCR: NEGATIVE

## 2016-07-20 LAB — GLUCOSE, CAPILLARY
GLUCOSE-CAPILLARY: 148 mg/dL — AB (ref 65–99)
GLUCOSE-CAPILLARY: 152 mg/dL — AB (ref 65–99)
Glucose-Capillary: 99 mg/dL (ref 65–99)
Glucose-Capillary: 225 mg/dL — ABNORMAL HIGH (ref 65–99)

## 2016-07-20 MED ORDER — ACETAMINOPHEN 325 MG PO TABS
650.0000 mg | ORAL_TABLET | Freq: Four times a day (QID) | ORAL | Status: DC | PRN
  Administered 2016-07-20: 650 mg via ORAL
  Filled 2016-07-20: qty 2

## 2016-07-20 MED ORDER — NAPHAZOLINE-GLYCERIN 0.012-0.2 % OP SOLN
1.0000 [drp] | Freq: Every day | OPHTHALMIC | Status: DC
  Administered 2016-07-20: 2 [drp] via OPHTHALMIC
  Administered 2016-07-21 – 2016-07-23 (×3): 1 [drp] via OPHTHALMIC
  Filled 2016-07-20: qty 15

## 2016-07-20 MED ORDER — SODIUM CHLORIDE 0.9 % IV SOLN
INTRAVENOUS | Status: DC
  Administered 2016-07-20 – 2016-07-21 (×2): via INTRAVENOUS

## 2016-07-20 MED ORDER — ALBUTEROL SULFATE (2.5 MG/3ML) 0.083% IN NEBU
2.5000 mg | INHALATION_SOLUTION | Freq: Two times a day (BID) | RESPIRATORY_TRACT | Status: DC
  Administered 2016-07-21 – 2016-07-23 (×5): 2.5 mg via RESPIRATORY_TRACT
  Filled 2016-07-20 (×5): qty 3

## 2016-07-20 MED ORDER — ALBUTEROL SULFATE (2.5 MG/3ML) 0.083% IN NEBU
3.0000 mL | INHALATION_SOLUTION | RESPIRATORY_TRACT | Status: DC | PRN
  Administered 2016-07-20: 3 mL via RESPIRATORY_TRACT
  Filled 2016-07-20: qty 3

## 2016-07-20 MED ORDER — IPRATROPIUM-ALBUTEROL 0.5-2.5 (3) MG/3ML IN SOLN: 3.0000 mL | Freq: Four times a day (QID) | RESPIRATORY_TRACT | Status: DC | PRN

## 2016-07-20 MED ORDER — SODIUM CHLORIDE 0.9 % IV SOLN
INTRAVENOUS | Status: AC
  Administered 2016-07-20 (×2): via INTRAVENOUS

## 2016-07-20 NOTE — Progress Notes (Signed)
Pharmacy Antibiotic Note  [name] is a 82 y.o. female admitted on 07/19/2016 with pneumonia.  Pharmacy has been consulted for renal dose antibiotics. She was started on rocephin and azithromycin.  Neither require renal adjustment  Plan: Cont antibiotics as ordered  Height: 5\' 10"  (177.8 cm) Weight: 101 lb 10.1 oz (46.1 kg) IBW/kg (Calculated) : 68.5  Temp (24hrs), Avg:99.9 F (37.7 C), Min:96.7 F (35.9 C), Max:101.5 F (38.6 C)   Recent Labs Lab 07/19/16 1747 07/19/16 2001 07/19/16 2203  WBC 14.4*  --   --   CREATININE 0.95  --   --   LATICACIDVEN  --  2.93* 4.05*    Estimated Creatinine Clearance: 29.8 mL/min (by C-G formula based on SCr of 0.95 mg/dL).    Allergies  Allergen Reactions  . Aspirin Other (See Comments)    Cannot take uncoated due to stomach ulcers     Thank you for allowing pharmacy to be a part of this patient's care.  Excell Seltzer Poteet 07/20/2016 7:44 AM

## 2016-07-20 NOTE — Progress Notes (Signed)
PROGRESS NOTE  [name]  [id number] DOB: 06-03-1926 DOA: 07/19/2016 PCP: Moshe Cipro, MD Outpatient Specialists:  Subjective: Seen with 2 daughters at bedside, denies any complaints. Still have some fever this morning   Brief Narrative:  [name] is a 82 y.o. female with a history of breast cancer, anemia, acid reflux, diabetes, hypertension, rheumatoid arthritis, kyphosis. Patient with history of UTIs. Patient has been hospitalized twice in the past 6 months for UTI with sepsis. Started having abdominal pain 2-3 days ago, then began to have some mild confusion with a fever today. Patient's daughters brought her here for evaluation. No palliating or provoking factors.  Assessment & Plan:   Principal Problem:   Sepsis ([place]) Active Problems:   HTN (hypertension)   Diabetes type 2, controlled ([place])   UTI (urinary tract infection)   CAP (community acquired pneumonia)   Pressure ulcer   Lactic acidosis   Sepsis Fever of 101.5, WBC 14.4 and presence of UTI/pneumonia on presentation Blood cultures and urine cultures performed Resuscitated with IV fluids, broad-spectrum antibiotics.  UTI Urine culture obtained Rocephin, will adjust antibiotics according to culture results.  Community acquired pneumonia Started on Rocephin and azithromycin. Continue supportive management with bronchodilators, mucolytics, antitussives and oxygen as needed.  Pressure ulcer Wound care consult  Lactic acidosis Repeat lactic acid to assure clearing  Diabetes type 2 Continue metformin CBGs before meals and daily at bedtime Sliding-scale insulin   Hypertension Hold antihypertensives for now    DVT prophylaxis: Lovenox Code Status: Full Code Family Communication:  Disposition Plan:  Diet: Diet Heart Room service appropriate? Yes; Fluid consistency: Thin  Consultants:   None  Procedures:   None  Antimicrobials:   Rocephin and azithromycin  Objective: Vitals:   07/20/16 0500 07/20/16 0600 07/20/16 0616 07/20/16 0724  BP: (!) 110/52 (!) 97/50    Pulse: (!) 108 (!) 113  (!) 107  Resp: (!) 28 (!) 27  (!) 22  Temp:   (!) 101.1 F (38.4 C) (!) 101.5 F (38.6 C)  TempSrc:   Oral Oral  SpO2: 96% 93%  92%  Weight:   46.1 kg (101 lb 10.1 oz)   Height:       No intake or output data in the 24 hours ending 07/20/16 [place]   07/19/16 2006 07/19/16 2306 07/20/16 0616  Weight: 48.1 kg (106 lb) 46.2 kg (101 lb 13.6 oz) 46.1 kg (101 lb 10.1 oz)    Examination: General exam: Appears calm and comfortable  Respiratory system: Clear to auscultation. Respiratory effort normal. Cardiovascular system: S1 & S2 heard, RRR. No JVD, murmurs, rubs, gallops or clicks. No pedal edema. Gastrointestinal system: Abdomen is nondistended, soft and nontender. No organomegaly or masses felt. Normal bowel sounds heard. Central nervous system: Alert and oriented. No focal neurological deficits. Extremities: Symmetric 5 x 5 power. Skin: No rashes, lesions or ulcers Psychiatry: Judgement and insight appear normal. Mood & affect appropriate.   Data Reviewed: I have personally reviewed following labs and imaging studies  CBC:  Recent Labs Lab 07/19/16 1747  WBC 14.4*  NEUTROABS 12.0*  HGB 11.0*  HCT 35.7*  MCV 92.7  PLT [id number]   Basic Metabolic Panel:  Recent Labs Lab 07/19/16 1747  NA 133*  K 4.8  CL 97*  CO2 29  GLUCOSE 223*  BUN 16  CREATININE 0.95  CALCIUM 9.3   GFR: Estimated Creatinine Clearance: 29.8 mL/min (by C-G formula based on SCr of 0.95 mg/dL). Liver Function Tests:  Recent Labs Lab 07/19/16  1747  AST 20  ALT 17  ALKPHOS 70  BILITOT 0.3  PROT 6.7  ALBUMIN 2.9*   No results for input(s): LIPASE, AMYLASE in the last 168 hours. No results for input(s): AMMONIA in the last 168 hours. Coagulation Profile: No results for input(s): INR, PROTIME in the last 168 hours. Cardiac Enzymes:  Recent Labs Lab 07/19/16 1747    TROPONINI <0.03   BNP (last 3 results) No results for input(s): PROBNP in the last 8760 hours. HbA1C: No results for input(s): HGBA1C in the last 72 hours. CBG:  Recent Labs Lab 07/19/16 2345 07/20/16 0722  GLUCAP 180* 148*   Lipid Profile: No results for input(s): CHOL, HDL, LDLCALC, TRIG, CHOLHDL, LDLDIRECT in the last 72 hours. Thyroid Function Tests: No results for input(s): TSH, T4TOTAL, FREET4, T3FREE, THYROIDAB in the last 72 hours. Anemia Panel: No results for input(s): VITAMINB12, FOLATE, FERRITIN, TIBC, IRON, RETICCTPCT in the last 72 hours. Urine analysis:    Component Value Date/Time   COLORURINE YELLOW 07/19/2016 1837   APPEARANCEUR CLOUDY (A) 07/19/2016 1837   LABSPEC <1.005 (L) 07/19/2016 1837   PHURINE 5.5 07/19/2016 1837   GLUCOSEU 250 (A) 07/19/2016 1837   HGBUR SMALL (A) 07/19/2016 1837   BILIRUBINUR NEGATIVE 07/19/2016 1837   KETONESUR NEGATIVE 07/19/2016 1837   PROTEINUR NEGATIVE 07/19/2016 1837   UROBILINOGEN 0.2 06/30/2015 1317   NITRITE NEGATIVE 07/19/2016 1837   LEUKOCYTESUR LARGE (A) 07/19/2016 1837   Sepsis Labs: @LABRCNTIP (procalcitonin:4,lacticidven:4)  ) Recent Results (from the past 240 hour(s))  Blood Culture (routine x 2)     Status: None (Preliminary result)   Collection Time: 07/19/16  7:47 PM  Result Value Ref Range Status   Specimen Description BLOOD RIGHT ARM  Final   Special Requests   Final    BOTTLES DRAWN AEROBIC AND ANAEROBIC 6CC DRAWN BY RN   Culture PENDING  Incomplete   Report Status PENDING  Incomplete  Blood Culture (routine x 2)     Status: None (Preliminary result)   Collection Time: 07/19/16  7:50 PM  Result Value Ref Range Status   Specimen Description BLOOD RIGHT ARM  Final   Special Requests BOTTLES DRAWN AEROBIC AND ANAEROBIC 6CC  Final   Culture PENDING  Incomplete   Report Status PENDING  Incomplete     Invalid input(s): PROCALCITONIN, LACTICACIDVEN   Radiology Studies: Dg Chest Portable 1  View  Result Date: 07/19/2016 CLINICAL DATA:  Acute onset of cough, fever and generalized chest pain. Initial encounter. EXAM: PORTABLE CHEST 1 VIEW COMPARISON:  Chest radiograph performed 04/13/2016 FINDINGS: The lungs are well-aerated. There is mild elevation of the left hemidiaphragm. The left lung apex is obscured by the patient's head. Mild right midlung opacity raises concern for pneumonia. There is no evidence of pleural effusion or pneumothorax. The cardiomediastinal silhouette is within normal limits. No acute osseous abnormalities are seen. Clips are seen overlying the left axilla. IMPRESSION: Mild right midlung airspace opacity raises concern for pneumonia. Mild elevation of the left hemidiaphragm. Electronically Signed   By: Garald Balding M.D.   On: 07/19/2016 19:31        Scheduled Meds: . aspirin EC  81 mg Oral Daily  . atorvastatin  20 mg Oral QHS  . azithromycin  500 mg Intravenous Q24H  . cefTRIAXone (ROCEPHIN)  IV  1 g Intravenous Q24H  . enoxaparin (LOVENOX) injection  40 mg Subcutaneous Q24H  . Influenza vac split quadrivalent PF  0.5 mL Intramuscular Tomorrow-1000  . insulin aspart  0-15  Units Subcutaneous TID WC  . insulin aspart  0-5 Units Subcutaneous QHS  . linagliptin  5 mg Oral q morning - 10a  . magnesium oxide  200 mg Oral Daily  . metFORMIN  500 mg Oral BID WC  . naphazoline-glycerin  1-2 drop Both Eyes Daily  . pantoprazole  40 mg Oral Daily  . predniSONE  5 mg Oral Q breakfast  . raloxifene  60 mg Oral q morning - 10a  . tamoxifen  20 mg Oral Daily   Continuous Infusions: . sodium chloride 100 mL/hr at 07/20/16 0104     LOS: 1 day    Time spent: 35 minutes    Stacye Noori A, MD [facility] Pager [phone]  If 7PM-7AM, please contact night-coverage [email or web address] Password [email or web address] 07/20/2016, 10:06 AM

## 2016-07-21 ENCOUNTER — Other Ambulatory Visit: Payer: Self-pay

## 2016-07-21 LAB — BLOOD CULTURE ID PANEL (REFLEXED)
ACINETOBACTER BAUMANNII: NOT DETECTED
CANDIDA ALBICANS: NOT DETECTED
Candida glabrata: NOT DETECTED
Candida krusei: NOT DETECTED
Candida parapsilosis: NOT DETECTED
Candida tropicalis: NOT DETECTED
ENTEROBACTERIACEAE SPECIES: NOT DETECTED
ENTEROCOCCUS SPECIES: NOT DETECTED
ESCHERICHIA COLI: NOT DETECTED
Enterobacter cloacae complex: NOT DETECTED
HAEMOPHILUS INFLUENZAE: NOT DETECTED
KLEBSIELLA OXYTOCA: NOT DETECTED
Klebsiella pneumoniae: NOT DETECTED
LISTERIA MONOCYTOGENES: NOT DETECTED
METHICILLIN RESISTANCE: NOT DETECTED
Neisseria meningitidis: NOT DETECTED
PSEUDOMONAS AERUGINOSA: NOT DETECTED
Proteus species: NOT DETECTED
SERRATIA MARCESCENS: NOT DETECTED
STAPHYLOCOCCUS AUREUS BCID: NOT DETECTED
STREPTOCOCCUS AGALACTIAE: NOT DETECTED
STREPTOCOCCUS PNEUMONIAE: NOT DETECTED
STREPTOCOCCUS PYOGENES: NOT DETECTED
Staphylococcus species: DETECTED — AB
Streptococcus species: NOT DETECTED

## 2016-07-21 LAB — GLUCOSE, CAPILLARY
GLUCOSE-CAPILLARY: 223 mg/dL — AB (ref 65–99)
GLUCOSE-CAPILLARY: 263 mg/dL — AB (ref 65–99)
GLUCOSE-CAPILLARY: 277 mg/dL — AB (ref 65–99)
Glucose-Capillary: 250 mg/dL — ABNORMAL HIGH (ref 65–99)

## 2016-07-21 LAB — BASIC METABOLIC PANEL
ANION GAP: 6 (ref 5–15)
BUN: 13 mg/dL (ref 6–20)
CHLORIDE: 104 mmol/L (ref 101–111)
CO2: 26 mmol/L (ref 22–32)
Calcium: 7.6 mg/dL — ABNORMAL LOW (ref 8.9–10.3)
Creatinine, Ser: 0.9 mg/dL (ref 0.44–1.00)
GFR, EST NON AFRICAN AMERICAN: 55 mL/min — AB (ref >60)
Glucose, Bld: 112 mg/dL — ABNORMAL HIGH (ref 65–99)
POTASSIUM: 3.6 mmol/L (ref 3.5–5.1)
SODIUM: 136 mmol/L (ref 135–145)

## 2016-07-21 LAB — STREP PNEUMONIAE URINARY ANTIGEN: Strep Pneumo Urinary Antigen: NEGATIVE

## 2016-07-21 LAB — CBC
HEMATOCRIT: 26.7 % — AB (ref 36.0–46.0)
Hemoglobin: 8.3 g/dL — ABNORMAL LOW (ref 12.0–15.0)
MCH: 28.2 pg (ref 26.0–34.0)
MCHC: 30.7 g/dL (ref 30.0–36.0)
MCV: 91.8 fL (ref 78.0–100.0)
Platelets: 279 10*3/uL (ref 150–400)
RBC: 2.91 MIL/uL — AB (ref 3.87–5.11)
RDW: 13.4 % (ref 11.5–15.5)
WBC: 11.3 10*3/uL — AB (ref 4.0–10.5)

## 2016-07-21 LAB — HIV ANTIBODY (ROUTINE TESTING W REFLEX): HIV SCREEN 4TH GENERATION: NONREACTIVE

## 2016-07-21 LAB — LACTIC ACID, PLASMA: Lactic Acid, Venous: 0.6 mmol/L (ref 0.5–1.9)

## 2016-07-21 MED ORDER — DILTIAZEM HCL 25 MG/5ML IV SOLN
INTRAVENOUS | Status: AC
  Administered 2016-07-21: 20 mg
  Filled 2016-07-21: qty 5

## 2016-07-21 MED ORDER — PREDNISONE 10 MG PO TABS
10.0000 mg | ORAL_TABLET | Freq: Every day | ORAL | Status: DC
  Administered 2016-07-21 – 2016-07-23 (×3): 10 mg via ORAL
  Filled 2016-07-21 (×3): qty 1

## 2016-07-21 MED ORDER — POTASSIUM CHLORIDE CRYS ER 20 MEQ PO TBCR
40.0000 meq | EXTENDED_RELEASE_TABLET | Freq: Once | ORAL | Status: AC
  Administered 2016-07-21: 40 meq via ORAL
  Filled 2016-07-21: qty 2

## 2016-07-21 MED ORDER — DILTIAZEM HCL 25 MG/5ML IV SOLN: 20.0000 mg | Freq: Once | INTRAVENOUS | Status: AC

## 2016-07-21 MED ORDER — COLLAGENASE 250 UNIT/GM EX OINT
TOPICAL_OINTMENT | Freq: Every day | CUTANEOUS | Status: DC
  Administered 2016-07-21 – 2016-07-23 (×3): via TOPICAL
  Filled 2016-07-21: qty 30

## 2016-07-21 MED ORDER — HYDROCORTISONE NA SUCCINATE PF 100 MG IJ SOLR
50.0000 mg | Freq: Three times a day (TID) | INTRAMUSCULAR | Status: AC
  Administered 2016-07-21: 50 mg via INTRAVENOUS
  Filled 2016-07-21: qty 2

## 2016-07-21 MED ORDER — HYDROCORTISONE NA SUCCINATE PF 100 MG IJ SOLR
50.0000 mg | Freq: Once | INTRAMUSCULAR | Status: AC
  Administered 2016-07-21: 50 mg via INTRAVENOUS
  Filled 2016-07-21: qty 2

## 2016-07-21 MED ORDER — METOPROLOL SUCCINATE ER 25 MG PO TB24
25.0000 mg | ORAL_TABLET | Freq: Every day | ORAL | Status: DC
  Administered 2016-07-21 – 2016-07-23 (×3): 25 mg via ORAL
  Filled 2016-07-21 (×3): qty 1

## 2016-07-21 NOTE — Progress Notes (Signed)
Initial Nutrition Assessment  DOCUMENTATION CODES:  Underweight    INTERVENTION:  Glucerna Shake po TID, each supplement provides 220 kcal and 10 grams of protein -provide between meals   NUTRITION DIAGNOSIS:   Increased nutrient needs related to wound healing as evidenced by estimated needs.   GOAL:   Patient will meet greater than or equal to 90% of their needs   MONITOR:   PO intake, Supplement acceptance, Labs, Weight trends, Skin  REASON FOR ASSESSMENT:   Low [place]    ASSESSMENT: Patient presents with c/o pain in abdomin, fever and altered mental status. She has a hx of UTI's, RA, Breast cancer, DM, HTN, and kyphosis. Patient has 4 daughters which are taking turns caring for their mom. They adamantly affirm that she has not experienced any changes in her appetite or weight status. According to documentation she is eating 50-75% of her meals (they are feeding her each meal). At home she usually eats 3 meals and snacks between. She takes a multivitamin and vitamin D daily. She is able to chew and swallow without difficulty per daughters.  Her skin integrity is compromised. She has a stage 3 on her sacrum with minimal drainage  and an area to right upper back per WOC note. Unable to complete Nutrition-Focused physical exam at this time.      Recent Labs Lab 07/19/16 1747 07/21/16 0420  NA 133* 136  K 4.8 3.6  CL 97* 104  CO2 29 26  BUN 16 13  CREATININE 0.95 0.90  CALCIUM 9.3 7.6*  GLUCOSE 223* 112*   Labs: glucose elevated. Last A1C-7.3% back in July CBG (last 3)   Recent Labs  07/20/16 1636 07/20/16 2122 07/21/16 0838  GLUCAP 225* 152* 223*      Diet Order:  Diet Heart Room service appropriate? Yes; Fluid consistency: Thin  Skin:  Wound (see comment)- WOC note   Last BM:  10/13   Height:   Ht Readings from Last 1 Encounters:  07/19/16 5\' 10"  (1.778 m)    Weight:   Wt Readings from Last 1 Encounters:  07/20/16 101 lb 10.1 oz (46.1 kg)   UBW-48 kg in July   Ideal Body Weight:  68 kg  BMI:  Body mass index is 14.58 kg/m. underweight  Estimated Nutritional Needs:   Kcal:  1610-1840 ( to prevent further weight loss)  Protein:  69-74 gr  Fluid:  1.2-1.3 liters daily  EDUCATION NEEDS:   Education needs addressed with two of her daughters  Colman Cater MS,RD,CSG,LDN Office: [phone] Pager: [phone]

## 2016-07-21 NOTE — Progress Notes (Signed)
PHARMACY - PHYSICIAN COMMUNICATION CRITICAL VALUE ALERT - BLOOD CULTURE IDENTIFICATION (BCID)  Results for orders placed or performed during the hospital encounter of 07/19/16  Blood Culture ID Panel (Reflexed) (Collected: 07/19/2016  7:47 PM)  Result Value Ref Range   Enterococcus species NOT DETECTED NOT DETECTED   Listeria monocytogenes NOT DETECTED NOT DETECTED   Staphylococcus species DETECTED (A) NOT DETECTED   Staphylococcus aureus NOT DETECTED NOT DETECTED   Methicillin resistance NOT DETECTED NOT DETECTED   Streptococcus species NOT DETECTED NOT DETECTED   Streptococcus agalactiae NOT DETECTED NOT DETECTED   Streptococcus pneumoniae NOT DETECTED NOT DETECTED   Streptococcus pyogenes NOT DETECTED NOT DETECTED   Acinetobacter baumannii NOT DETECTED NOT DETECTED   Enterobacteriaceae species NOT DETECTED NOT DETECTED   Enterobacter cloacae complex NOT DETECTED NOT DETECTED   Escherichia coli NOT DETECTED NOT DETECTED   Klebsiella oxytoca NOT DETECTED NOT DETECTED   Klebsiella pneumoniae NOT DETECTED NOT DETECTED   Proteus species NOT DETECTED NOT DETECTED   Serratia marcescens NOT DETECTED NOT DETECTED   Haemophilus influenzae NOT DETECTED NOT DETECTED   Neisseria meningitidis NOT DETECTED NOT DETECTED   Pseudomonas aeruginosa NOT DETECTED NOT DETECTED   Candida albicans NOT DETECTED NOT DETECTED   Candida glabrata NOT DETECTED NOT DETECTED   Candida krusei NOT DETECTED NOT DETECTED   Candida parapsilosis NOT DETECTED NOT DETECTED   Candida tropicalis NOT DETECTED NOT DETECTED    Name of physician (or Provider) Contacted: [place]  Changes to prescribed antibiotics required: None, observe for now.  Continue Azithromycin and Rocephin.  [name] 07/21/2016  9:34 AM

## 2016-07-21 NOTE — Progress Notes (Signed)
Patient's HR went into the 160's and sustained. EKG confirmed SVT. Paged MD. Patient was not symptomatic. Per MD order, 20mg   Cardizem IV and 50mg  Solu Cortef were given. MD also restarted patients metoprolol. Patient HR is back to NSR, and she remains asymptomatic.   Margaret Pyle, RN

## 2016-07-21 NOTE — Plan of Care (Signed)
Problem: Safety: Goal: Ability to remain free from injury will improve Outcome: Progressing Patient is free from injury at this time. Bed in lowest position, call light within reach, and 3 rails are up.   Problem: Pain Managment: Goal: General experience of comfort will improve Outcome: Progressing Patient was free of pain.  Problem: Skin Integrity: Goal: Risk for impaired skin integrity will decrease Outcome: Progressing Patient's skin wounds are being maintained, cleansed and protected. Bed is set on rotation and low air loss.   Problem: Tissue Perfusion: Goal: Risk factors for ineffective tissue perfusion will decrease Outcome: Progressing Patient is receiving lovenox for VTE prophylaxis and ROM is performed on all extremities.

## 2016-07-21 NOTE — Progress Notes (Signed)
NIGHT: CTSP re tachycardia. Asymptomatic, HR 160, SBP 120. Admitted for sepsis with UTI, hx of RA on chronic steroid. Noted home meds included Toprol. Gave 1 dose of Cardiazem IV, HR came back to 90. Will restart Toprol XL home dose, increase Prednisone to 10mg  per day for presumed adrenal insufficiency, and give stress dose steroid 50mg  IV q8 x 3 total.   Thanks,  Orvan Falconer MD FACP. Hospitalist.

## 2016-07-21 NOTE — Progress Notes (Signed)
PROGRESS NOTE  [name]  [id number] DOB: [birth year] DOA: 07/19/2016 PCP: Moshe Cipro, MD Outpatient Specialists:  Subjective: Again seen with 2 daughters at bedside, denies any complaints. Feels much better, seen while she was eating her breakfast this morning.  Brief Narrative:  Elizabth Young is a 82 y.o. female with a history of breast cancer, anemia, acid reflux, diabetes, hypertension, rheumatoid arthritis, kyphosis. Patient with history of UTIs. Patient has been hospitalized twice in the past 6 months for UTI with sepsis. Started having abdominal pain 2-3 days ago, then began to have some mild confusion with a fever today. Patient's daughters brought her here for evaluation. No palliating or provoking factors.  Assessment & Plan:   Principal Problem:   Sepsis ([place]) Active Problems:   HTN (hypertension)   Diabetes type 2, controlled ([place])   UTI (urinary tract infection)   CAP (community acquired pneumonia)   Pressure ulcer   Lactic acidosis   Sepsis Fever of 101.5, WBC 14.4 and presence of UTI/pneumonia on presentation Blood cultures and urine cultures performed Resuscitated with IV fluids, broad-spectrum antibiotics. 1/2 blood cultures showed staph species, likely coagulase-negative staph.  UTI Urine culture obtained Rocephin, will adjust antibiotics according to culture results.  Community acquired pneumonia Started on Rocephin and azithromycin. Continue supportive management with bronchodilators, mucolytics, antitussives and oxygen as needed.  Pressure ulcer Wound care consult  Lactic acidosis Repeat lactic acid to assure clearing  Diabetes type 2 Continue metformin CBGs before meals and daily at bedtime Sliding-scale insulin   Hypertension Hold antihypertensives for now    DVT prophylaxis: Lovenox Code Status: Full Code Family Communication:  Disposition Plan: Transfer to regular floor. Diet: Diet Heart Room service appropriate? Yes;  Fluid consistency: Thin  Consultants:   None  Procedures:   None  Antimicrobials:   Rocephin and azithromycin  Objective: Vitals:   07/21/16 0730 07/21/16 0847 07/21/16 0900 07/21/16 0930  BP: 126/61  (!) 149/75 (!) 118/58  Pulse: 84  95 (!) 106  Resp: 19  (!) 21 20  Temp:      TempSrc:      SpO2: 98% 100% 100% 98%  Weight:      Height:        Intake/Output Summary (Last 24 hours) at 07/21/16 1133 Last data filed at 07/21/16 0900  Gross per 24 hour  Intake          1891.25 ml  Output                0 ml  Net          1891.25 ml   Filed Weights   07/19/16 2006 07/19/16 2306 07/20/16 0616  Weight: 48.1 kg (106 lb) 46.2 kg (101 lb 13.6 oz) 46.1 kg (101 lb 10.1 oz)    Examination: General exam: Appears calm and comfortable  Respiratory system: Clear to auscultation. Respiratory effort normal. Cardiovascular system: S1 & S2 heard, RRR. No JVD, murmurs, rubs, gallops or clicks. No pedal edema. Gastrointestinal system: Abdomen is nondistended, soft and nontender. No organomegaly or masses felt. Normal bowel sounds heard. Central nervous system: Alert and oriented. No focal neurological deficits. Extremities: Symmetric 5 x 5 power. Skin: No rashes, lesions or ulcers Psychiatry: Judgement and insight appear normal. Mood & affect appropriate.   Data Reviewed: I have personally reviewed following labs and imaging studies  CBC:  Recent Labs Lab 07/19/16 1747 07/21/16 0420  WBC 14.4* 11.3*  NEUTROABS 12.0*  --   HGB 11.0* 8.3*  HCT 35.7* 26.7*  MCV 92.7 91.8  PLT 351 [id number]   Basic Metabolic Panel:  Recent Labs Lab 07/19/16 1747 07/21/16 0420  NA 133* 136  K 4.8 3.6  CL 97* 104  CO2 29 26  GLUCOSE 223* 112*  BUN 16 13  CREATININE 0.95 0.90  CALCIUM 9.3 7.6*   GFR: Estimated Creatinine Clearance: 31.4 mL/min (by C-G formula based on SCr of 0.9 mg/dL). Liver Function Tests:  Recent Labs Lab 07/19/16 1747  AST 20  ALT 17  ALKPHOS 70  BILITOT 0.3    PROT 6.7  ALBUMIN 2.9*   No results for input(s): LIPASE, AMYLASE in the last 168 hours. No results for input(s): AMMONIA in the last 168 hours. Coagulation Profile: No results for input(s): INR, PROTIME in the last 168 hours. Cardiac Enzymes:  Recent Labs Lab 07/19/16 1747  TROPONINI <0.03   BNP (last 3 results) No results for input(s): PROBNP in the last 8760 hours. HbA1C: No results for input(s): HGBA1C in the last 72 hours. CBG:  Recent Labs Lab 07/20/16 0722 07/20/16 1124 07/20/16 1636 07/20/16 2122 07/21/16 0838  GLUCAP 148* 99 225* 152* 223*   Lipid Profile: No results for input(s): CHOL, HDL, LDLCALC, TRIG, CHOLHDL, LDLDIRECT in the last 72 hours. Thyroid Function Tests: No results for input(s): TSH, T4TOTAL, FREET4, T3FREE, THYROIDAB in the last 72 hours. Anemia Panel: No results for input(s): VITAMINB12, FOLATE, FERRITIN, TIBC, IRON, RETICCTPCT in the last 72 hours. Urine analysis:    Component Value Date/Time   COLORURINE YELLOW 07/19/2016 1837   APPEARANCEUR CLOUDY (A) 07/19/2016 1837   LABSPEC <1.005 (L) 07/19/2016 1837   PHURINE 5.5 07/19/2016 1837   GLUCOSEU 250 (A) 07/19/2016 1837   HGBUR SMALL (A) 07/19/2016 1837   BILIRUBINUR NEGATIVE 07/19/2016 1837   KETONESUR NEGATIVE 07/19/2016 1837   PROTEINUR NEGATIVE 07/19/2016 1837   UROBILINOGEN 0.2 06/30/2015 1317   NITRITE NEGATIVE 07/19/2016 1837   LEUKOCYTESUR LARGE (A) 07/19/2016 1837   Sepsis Labs: @LABRCNTIP (procalcitonin:4,lacticidven:4)  ) Recent Results (from the past 240 hour(s))  Blood Culture (routine x 2)     Status: None (Preliminary result)   Collection Time: 07/19/16  7:47 PM  Result Value Ref Range Status   Specimen Description BLOOD RIGHT ARM  Final   Special Requests   Final    BOTTLES DRAWN AEROBIC AND ANAEROBIC 6CC DRAWN BY RN   Culture  Setup Time   Final    GRAM POSITIVE COCCI Gram Stain Report Called to,Read Back By and Verified With: HOWARD,C ON 07/20/16 AT 2035 BY  LOY,C PERFORMED AT [facility] ANAEROBIC BOTTLE ONLY Performed at [facility]  Final   Report Status PENDING  Incomplete  Blood Culture ID Panel (Reflexed)     Status: Abnormal   Collection Time: 07/19/16  7:47 PM  Result Value Ref Range Status   Enterococcus species NOT DETECTED NOT DETECTED Final   Listeria monocytogenes NOT DETECTED NOT DETECTED Final   Staphylococcus species DETECTED (A) NOT DETECTED Final    Comment: CRITICAL RESULT CALLED TO, READ BACK BY AND VERIFIED WITH: M HAYES,PHARMD AT 0903 07/21/16 BY L BENFIELD    Staphylococcus aureus NOT DETECTED NOT DETECTED Final   Methicillin resistance NOT DETECTED NOT DETECTED Final   Streptococcus species NOT DETECTED NOT DETECTED Final   Streptococcus agalactiae NOT DETECTED NOT DETECTED Final   Streptococcus pneumoniae NOT DETECTED NOT DETECTED Final   Streptococcus pyogenes NOT DETECTED NOT DETECTED Final   Acinetobacter baumannii NOT DETECTED NOT DETECTED  Final   Enterobacteriaceae species NOT DETECTED NOT DETECTED Final   Enterobacter cloacae complex NOT DETECTED NOT DETECTED Final   Escherichia coli NOT DETECTED NOT DETECTED Final   Klebsiella oxytoca NOT DETECTED NOT DETECTED Final   Klebsiella pneumoniae NOT DETECTED NOT DETECTED Final   Proteus species NOT DETECTED NOT DETECTED Final   Serratia marcescens NOT DETECTED NOT DETECTED Final   Haemophilus influenzae NOT DETECTED NOT DETECTED Final   Neisseria meningitidis NOT DETECTED NOT DETECTED Final   Pseudomonas aeruginosa NOT DETECTED NOT DETECTED Final   Candida albicans NOT DETECTED NOT DETECTED Final   Candida glabrata NOT DETECTED NOT DETECTED Final   Candida krusei NOT DETECTED NOT DETECTED Final   Candida parapsilosis NOT DETECTED NOT DETECTED Final   Candida tropicalis NOT DETECTED NOT DETECTED Final    Comment: Performed at [facility]  Blood Culture (routine x 2)     Status: None (Preliminary result)   Collection  Time: 07/19/16  7:50 PM  Result Value Ref Range Status   Specimen Description BLOOD RIGHT ARM  Final   Special Requests BOTTLES DRAWN AEROBIC AND ANAEROBIC 6CC  Final   Culture NO GROWTH 2 DAYS  Final   Report Status PENDING  Incomplete  MRSA PCR Screening     Status: None   Collection Time: 07/20/16  7:31 PM  Result Value Ref Range Status   MRSA by PCR NEGATIVE NEGATIVE Final    Comment:        The GeneXpert MRSA Assay (FDA approved for NASAL specimens only), is one component of a comprehensive MRSA colonization surveillance program. It is not intended to diagnose MRSA infection nor to guide or monitor treatment for MRSA infections.      Invalid input(s): PROCALCITONIN, LACTICACIDVEN   Radiology Studies: Dg Chest Portable 1 View  Result Date: 07/19/2016 CLINICAL DATA:  Acute onset of cough, fever and generalized chest pain. Initial encounter. EXAM: PORTABLE CHEST 1 VIEW COMPARISON:  Chest radiograph performed 04/13/2016 FINDINGS: The lungs are well-aerated. There is mild elevation of the left hemidiaphragm. The left lung apex is obscured by the patient's head. Mild right midlung opacity raises concern for pneumonia. There is no evidence of pleural effusion or pneumothorax. The cardiomediastinal silhouette is within normal limits. No acute osseous abnormalities are seen. Clips are seen overlying the left axilla. IMPRESSION: Mild right midlung airspace opacity raises concern for pneumonia. Mild elevation of the left hemidiaphragm. Electronically Signed   By: Garald Balding M.D.   On: 07/19/2016 19:31        Scheduled Meds: . albuterol  2.5 mg Nebulization BID  . aspirin EC  81 mg Oral Daily  . atorvastatin  20 mg Oral QHS  . azithromycin  500 mg Intravenous Q24H  . cefTRIAXone (ROCEPHIN)  IV  1 g Intravenous Q24H  . collagenase   Topical Daily  . enoxaparin (LOVENOX) injection  40 mg Subcutaneous Q24H  . hydrocortisone sod succinate (SOLU-CORTEF) inj  50 mg Intravenous Q8H   . insulin aspart  0-15 Units Subcutaneous TID WC  . insulin aspart  0-5 Units Subcutaneous QHS  . linagliptin  5 mg Oral q morning - 10a  . magnesium oxide  200 mg Oral Daily  . metFORMIN  500 mg Oral BID WC  . metoprolol succinate  25 mg Oral Daily  . naphazoline-glycerin  1-2 drop Both Eyes Daily  . pantoprazole  40 mg Oral Daily  . predniSONE  10 mg Oral Q breakfast  . raloxifene  60 mg  Oral q morning - 10a  . tamoxifen  20 mg Oral Daily   Continuous Infusions: . sodium chloride 75 mL/hr at 07/21/16 0900     LOS: 2 days    Time spent: 35 minutes    Mabry Santarelli A, MD [facility] Pager [phone]  If 7PM-7AM, please contact night-coverage [email or web address] Password [email or web address] 07/21/2016, 11:33 AM

## 2016-07-21 NOTE — Consult Note (Signed)
[place] Nurse wound consult note Reason for Consult: Stage 3 pressure injury to sacrum, POA Stage 3 pressure injury to right upper back over bony prominence Wound type:pressure injury Pressure Ulcer POA: Yes Measurement: Right upper back:  2 cm x 2 cm x 0.2 cm with 25% yellow, adherent slough to wound bed.  Sacrum :  1 cm x 0.5 cm x 0.2 cm with 25% yellow adherent slough Wound bed:75% pale pink nongranulating 25% adherent slough Drainage (amount, consistency, odor) Minimal serosanguinous.  No odor.  Periwound:Intact Dressing procedure/placement/frequency:Cleanse wounds to right upper back and sacrum with NS and pat gently dry.  Apply Santyl to wound bed.  Cover with NS moist gauze.  Secure with 4x4 gauze and silicone border foam dressing.  Change daily.   Will not follow at this time.  Please re-consult if needed.  Domenic Moras RN BSN [place] Pager [phone]

## 2016-07-22 LAB — CBC
HEMATOCRIT: 24.2 % — AB (ref 36.0–46.0)
Hemoglobin: 7.7 g/dL — ABNORMAL LOW (ref 12.0–15.0)
MCH: 29.1 pg (ref 26.0–34.0)
MCHC: 31.8 g/dL (ref 30.0–36.0)
MCV: 91.3 fL (ref 78.0–100.0)
Platelets: 287 10*3/uL (ref 150–400)
RBC: 2.65 MIL/uL — AB (ref 3.87–5.11)
RDW: 13.4 % (ref 11.5–15.5)
WBC: 13.5 10*3/uL — AB (ref 4.0–10.5)

## 2016-07-22 LAB — BASIC METABOLIC PANEL
Anion gap: 5 (ref 5–15)
BUN: 13 mg/dL (ref 6–20)
CHLORIDE: 106 mmol/L (ref 101–111)
CO2: 25 mmol/L (ref 22–32)
Calcium: 7.7 mg/dL — ABNORMAL LOW (ref 8.9–10.3)
Creatinine, Ser: 0.76 mg/dL (ref 0.44–1.00)
GFR calc Af Amer: >60 mL/min (ref >60)
GFR calc non Af Amer: >60 mL/min (ref >60)
Glucose, Bld: 200 mg/dL — ABNORMAL HIGH (ref 65–99)
POTASSIUM: 4.1 mmol/L (ref 3.5–5.1)
SODIUM: 136 mmol/L (ref 135–145)

## 2016-07-22 LAB — GLUCOSE, CAPILLARY
GLUCOSE-CAPILLARY: 271 mg/dL — AB (ref 65–99)
GLUCOSE-CAPILLARY: 162 mg/dL — AB (ref 65–99)
Glucose-Capillary: 151 mg/dL — ABNORMAL HIGH (ref 65–99)
Glucose-Capillary: 186 mg/dL — ABNORMAL HIGH (ref 65–99)
Glucose-Capillary: 265 mg/dL — ABNORMAL HIGH (ref 65–99)

## 2016-07-22 NOTE — Care Management Important Message (Signed)
Important Message  Patient Details  Name: [name] MRN: [id number] Date of Birth: May 01, 1926   Medicare Important Message Given:  Yes    Sherald Barge, RN 07/22/2016, 2:21 PM

## 2016-07-22 NOTE — Progress Notes (Signed)
Inpatient Diabetes Program Recommendations  AACE/ADA: New Consensus Statement on Inpatient Glycemic Control (2015)  Target Ranges:  Prepandial:   less than 140 mg/dL      Peak postprandial:   less than 180 mg/dL (1-2 hours)      Critically ill patients:  140 - 180 mg/dL   Results for [name], [name] (MRN [id number]) as of 07/22/2016 07:48  Ref. Range 07/21/2016 08:38 07/21/2016 11:36 07/21/2016 16:23 07/21/2016 21:23 07/22/2016 00:14 07/22/2016 07:11  Glucose-Capillary Latest Ref Range: 65 - 99 mg/dL 223 (H) 250 (H) 263 (H) 277 (H) 265 (H) 186 (H)   Review of Glycemic Control  Diabetes history: DM2 Outpatient Diabetes medications: Tradjenta 5 mg QAM, Metformin 500 mg BID Current orders for Inpatient glycemic control: Novolog 0-15 units TID with meals, Novolog 0-5 units QHS, Tradjenta 5 mg QAM, Metformin 500 mg BID  Inpatient Diabetes Program Recommendations: Insulin - Meal Coverage: Post prandial glucose is consistently elevated. If steroids are continued and patient is eating at least 50% of meals, please consider ordering Novolog 3 units TID with meals for meal coverage.  Thanks, Barnie Alderman, RN, MSN, CDE Diabetes Coordinator Inpatient Diabetes Program [phone] (Team Pager from [place] to [place]) [phone] ([place] office) [phone] [place] office) [phone] [facility] office)

## 2016-07-22 NOTE — Care Management Note (Signed)
Case Management Note  Patient Details  Name: [name] MRN: [id number] Date of Birth: 1926/03/19  Subjective/Objective:                  Pt admitted with sepsis. She is from home, lives with her family and has 24/7 care either by family or PD sitters. Family states they have all DME needed to care for her. They have WC, Lift chair. They ask about new/different ramp for her wheelchair. CM recommended pt family reach out to DME company of their choice to discuss options. Pt has no HH nursing or PT services PTA and family not interested in them. Family plans to take pt home with resumption of previous arrangements. Family will transport pt home.   Action/Plan: Anticipate DC home tomorrow. No CM needs.   Expected Discharge Date:    07/23/2016              Expected Discharge Plan:  Home/Self Care  In-House Referral:  NA  Discharge planning Services  CM Consult  Post Acute Care Choice:  NA Choice offered to:  NA  Status of Service:  Completed, signed off   Sherald Barge, RN 07/22/2016, 2:15 PM

## 2016-07-22 NOTE — Progress Notes (Signed)
PROGRESS NOTE  [name]  [id number] DOB: [birth year] DOA: 07/19/2016 PCP: Moshe Cipro, MD Outpatient Specialists:  Subjective: Seen with 2 daughters at bedside, denies any complaints. Doing OK transfer to regular bed, likely can discharge in AM  Brief Narrative:  [name] is a 82 y.o. female with a history of breast cancer, anemia, acid reflux, diabetes, hypertension, rheumatoid arthritis, kyphosis. Patient with history of UTIs. Patient has been hospitalized twice in the past 6 months for UTI with sepsis. Started having abdominal pain 2-3 days ago, then began to have some mild confusion with a fever today. Patient's daughters brought her here for evaluation. No palliating or provoking factors.  Assessment & Plan:   Principal Problem:   Sepsis ([place]) Active Problems:   HTN (hypertension)   Diabetes type 2, controlled ([place])   UTI (urinary tract infection)   CAP (community acquired pneumonia)   Pressure ulcer   Lactic acidosis   Sepsis Fever of 101.5, WBC 14.4 and presence of UTI/pneumonia on presentation Blood cultures and urine cultures performed Resuscitated with IV fluids, broad-spectrum antibiotics. 1/2 blood cultures showed staph species, likely coagulase-negative staph.   UTI Urine culture obtained, culture showed more than 100,000 GNR, susceptibility and identification to follow. Continue Rocephin for now, previous Escherichia coli culture from July is resistant to Cipro and ampicillin. If no susceptibility can probably be discharged on Ceftin or Vantin for 5 more days.  Community acquired pneumonia Started on Rocephin and azithromycin. Continue supportive management with bronchodilators, mucolytics, antitussives and oxygen as needed.  Pressure ulcer Wound care consult  Lactic acidosis Repeat lactic acid to assure clearing  Diabetes type 2 Continue metformin and Tradjenta, patient received 2 doses of hydrocortisone Blood sugar was consistently  elevated but likely is going to improve now. CBGs before meals and daily at bedtime Sliding-scale insulin  Hypertension Hold antihypertensives for now    DVT prophylaxis: Lovenox. Code Status: Full Code Family Communication:  Disposition Plan: Transfer to regular floor. Diet: Diet Heart Room service appropriate? Yes; Fluid consistency: Thin  Consultants:   None  Procedures:   None  Antimicrobials:   Rocephin and azithromycin  Objective: Vitals:   07/22/16 0500 07/22/16 0714 07/22/16 0800 07/22/16 0845  BP:   (!) 142/71   Pulse:  (!) 146 89   Resp:  (!) 21 (!) 24   Temp:  98.3 F (36.8 C)    TempSrc:  Oral    SpO2:  98% 100% 98%  Weight: 48.6 kg (107 lb 2.3 oz)     Height:        Intake/Output Summary (Last 24 hours) at 07/22/16 1116 Last data filed at 07/22/16 0842  Gross per 24 hour  Intake              240 ml  Output                0 ml  Net              240 ml   Filed Weights   07/19/16 2306 07/20/16 0616 07/22/16 0500  Weight: 46.2 kg (101 lb 13.6 oz) 46.1 kg (101 lb 10.1 oz) 48.6 kg (107 lb 2.3 oz)    Examination: General exam: Appears calm and comfortable  Respiratory system: Clear to auscultation. Respiratory effort normal. Cardiovascular system: S1 & S2 heard, RRR. No JVD, murmurs, rubs, gallops or clicks. No pedal edema. Gastrointestinal system: Abdomen is nondistended, soft and nontender. No organomegaly or masses felt. Normal bowel sounds heard. Central nervous system: Alert  and oriented. No focal neurological deficits. Extremities: Symmetric 5 x 5 power. Skin: No rashes, lesions or ulcers Psychiatry: Judgement and insight appear normal. Mood & affect appropriate.   Data Reviewed: I have personally reviewed following labs and imaging studies  CBC:  Recent Labs Lab 07/19/16 1747 07/21/16 0420 07/22/16 0450  WBC 14.4* 11.3* 13.5*  NEUTROABS 12.0*  --   --   HGB 11.0* 8.3* 7.7*  HCT 35.7* 26.7* 24.2*  MCV 92.7 91.8 91.3  PLT 351 279  [id number]   Basic Metabolic Panel:  Recent Labs Lab 07/19/16 1747 07/21/16 0420 07/22/16 0450  NA 133* 136 136  K 4.8 3.6 4.1  CL 97* 104 106  CO2 29 26 25   GLUCOSE 223* 112* 200*  BUN 16 13 13   CREATININE 0.95 0.90 0.76  CALCIUM 9.3 7.6* 7.7*   GFR: Estimated Creatinine Clearance: 37.3 mL/min (by C-G formula based on SCr of 0.76 mg/dL). Liver Function Tests:  Recent Labs Lab 07/19/16 1747  AST 20  ALT 17  ALKPHOS 70  BILITOT 0.3  PROT 6.7  ALBUMIN 2.9*   No results for input(s): LIPASE, AMYLASE in the last 168 hours. No results for input(s): AMMONIA in the last 168 hours. Coagulation Profile: No results for input(s): INR, PROTIME in the last 168 hours. Cardiac Enzymes:  Recent Labs Lab 07/19/16 1747  TROPONINI <0.03   BNP (last 3 results) No results for input(s): PROBNP in the last 8760 hours. HbA1C: No results for input(s): HGBA1C in the last 72 hours. CBG:  Recent Labs Lab 07/21/16 1136 07/21/16 1623 07/21/16 2123 07/22/16 0014 07/22/16 0711  GLUCAP 250* 263* 277* 265* 186*   Lipid Profile: No results for input(s): CHOL, HDL, LDLCALC, TRIG, CHOLHDL, LDLDIRECT in the last 72 hours. Thyroid Function Tests: No results for input(s): TSH, T4TOTAL, FREET4, T3FREE, THYROIDAB in the last 72 hours. Anemia Panel: No results for input(s): VITAMINB12, FOLATE, FERRITIN, TIBC, IRON, RETICCTPCT in the last 72 hours. Urine analysis:    Component Value Date/Time   COLORURINE YELLOW 07/19/2016 1837   APPEARANCEUR CLOUDY (A) 07/19/2016 1837   LABSPEC <1.005 (L) 07/19/2016 1837   PHURINE 5.5 07/19/2016 1837   GLUCOSEU 250 (A) 07/19/2016 1837   HGBUR SMALL (A) 07/19/2016 1837   BILIRUBINUR NEGATIVE 07/19/2016 1837   KETONESUR NEGATIVE 07/19/2016 1837   PROTEINUR NEGATIVE 07/19/2016 1837   UROBILINOGEN 0.2 06/30/2015 1317   NITRITE NEGATIVE 07/19/2016 1837   LEUKOCYTESUR LARGE (A) 07/19/2016 1837   Sepsis  Labs: @LABRCNTIP (procalcitonin:4,lacticidven:4)  ) Recent Results (from the past 240 hour(s))  Urine culture     Status: Abnormal (Preliminary result)   Collection Time: 07/19/16  6:45 PM  Result Value Ref Range Status   Specimen Description URINE, CATHETERIZED  Final   Special Requests NONE  Final   Culture >=100,000 COLONIES/mL GRAM NEGATIVE RODS (A)  Final   Report Status PENDING  Incomplete  Blood Culture (routine x 2)     Status: None (Preliminary result)   Collection Time: 07/19/16  7:47 PM  Result Value Ref Range Status   Specimen Description BLOOD RIGHT ARM  Final   Special Requests   Final    BOTTLES DRAWN AEROBIC AND ANAEROBIC 6CC DRAWN BY RN   Culture  Setup Time   Final    GRAM POSITIVE COCCI Gram Stain Report Called to,Read Back By and Verified With: HOWARD,C ON 07/20/16 AT 2035 BY LOY,C PERFORMED AT [facility] ANAEROBIC BOTTLE ONLY Performed at [place]  Final   Report Status PENDING  Incomplete  Blood Culture ID Panel (Reflexed)     Status: Abnormal   Collection Time: 07/19/16  7:47 PM  Result Value Ref Range Status   Enterococcus species NOT DETECTED NOT DETECTED Final   Listeria monocytogenes NOT DETECTED NOT DETECTED Final   Staphylococcus species DETECTED (A) NOT DETECTED Final    Comment: CRITICAL RESULT CALLED TO, READ BACK BY AND VERIFIED WITH: M HAYES,PHARMD AT 0903 07/21/16 BY L BENFIELD    Staphylococcus aureus NOT DETECTED NOT DETECTED Final   Methicillin resistance NOT DETECTED NOT DETECTED Final   Streptococcus species NOT DETECTED NOT DETECTED Final   Streptococcus agalactiae NOT DETECTED NOT DETECTED Final   Streptococcus pneumoniae NOT DETECTED NOT DETECTED Final   Streptococcus pyogenes NOT DETECTED NOT DETECTED Final   Acinetobacter baumannii NOT DETECTED NOT DETECTED Final   Enterobacteriaceae species NOT DETECTED NOT DETECTED Final   Enterobacter cloacae complex NOT DETECTED NOT DETECTED Final    Escherichia coli NOT DETECTED NOT DETECTED Final   Klebsiella oxytoca NOT DETECTED NOT DETECTED Final   Klebsiella pneumoniae NOT DETECTED NOT DETECTED Final   Proteus species NOT DETECTED NOT DETECTED Final   Serratia marcescens NOT DETECTED NOT DETECTED Final   Haemophilus influenzae NOT DETECTED NOT DETECTED Final   Neisseria meningitidis NOT DETECTED NOT DETECTED Final   Pseudomonas aeruginosa NOT DETECTED NOT DETECTED Final   Candida albicans NOT DETECTED NOT DETECTED Final   Candida glabrata NOT DETECTED NOT DETECTED Final   Candida krusei NOT DETECTED NOT DETECTED Final   Candida parapsilosis NOT DETECTED NOT DETECTED Final   Candida tropicalis NOT DETECTED NOT DETECTED Final    Comment: Performed at [facility]  Blood Culture (routine x 2)     Status: None (Preliminary result)   Collection Time: 07/19/16  7:50 PM  Result Value Ref Range Status   Specimen Description BLOOD RIGHT ARM  Final   Special Requests BOTTLES DRAWN AEROBIC AND ANAEROBIC 6CC  Final   Culture NO GROWTH 3 DAYS  Final   Report Status PENDING  Incomplete  MRSA PCR Screening     Status: None   Collection Time: 07/20/16  7:31 PM  Result Value Ref Range Status   MRSA by PCR NEGATIVE NEGATIVE Final    Comment:        The GeneXpert MRSA Assay (FDA approved for NASAL specimens only), is one component of a comprehensive MRSA colonization surveillance program. It is not intended to diagnose MRSA infection nor to guide or monitor treatment for MRSA infections.      Invalid input(s): PROCALCITONIN, [place]   Radiology Studies: No results found.      Scheduled Meds: . albuterol  2.5 mg Nebulization BID  . aspirin EC  81 mg Oral Daily  . atorvastatin  20 mg Oral QHS  . azithromycin  500 mg Intravenous Q24H  . cefTRIAXone (ROCEPHIN)  IV  1 g Intravenous Q24H  . collagenase   Topical Daily  . enoxaparin (LOVENOX) injection  40 mg Subcutaneous Q24H  . insulin aspart  0-15 Units  Subcutaneous TID WC  . insulin aspart  0-5 Units Subcutaneous QHS  . linagliptin  5 mg Oral q morning - 10a  . magnesium oxide  200 mg Oral Daily  . metFORMIN  500 mg Oral BID WC  . metoprolol succinate  25 mg Oral Daily  . naphazoline-glycerin  1-2 drop Both Eyes Daily  . pantoprazole  40 mg Oral Daily  . predniSONE  10 mg Oral Q breakfast  . raloxifene  60 mg Oral q morning - 10a  . tamoxifen  20 mg Oral Daily   Continuous Infusions:     LOS: 3 days    Time spent: 35 minutes    Johnnae Impastato A, MD [facility] Pager [phone]  If 7PM-7AM, please contact night-coverage [email or web address] Password [email or web address] 07/22/2016, 11:16 AM

## 2016-07-23 LAB — BASIC METABOLIC PANEL
Anion gap: 5 (ref 5–15)
BUN: 11 mg/dL (ref 6–20)
CALCIUM: 8.2 mg/dL — AB (ref 8.9–10.3)
CO2: 28 mmol/L (ref 22–32)
CREATININE: 0.74 mg/dL (ref 0.44–1.00)
Chloride: 105 mmol/L (ref 101–111)
GFR calc Af Amer: >60 mL/min (ref >60)
Glucose, Bld: 124 mg/dL — ABNORMAL HIGH (ref 65–99)
Potassium: 3.9 mmol/L (ref 3.5–5.1)
SODIUM: 138 mmol/L (ref 135–145)

## 2016-07-23 LAB — CBC
HCT: 28.9 % — ABNORMAL LOW (ref 36.0–46.0)
Hemoglobin: 9 g/dL — ABNORMAL LOW (ref 12.0–15.0)
MCH: 28.8 pg (ref 26.0–34.0)
MCHC: 31.1 g/dL (ref 30.0–36.0)
MCV: 92.3 fL (ref 78.0–100.0)
PLATELETS: 159 10*3/uL (ref 150–400)
RBC: 3.13 MIL/uL — ABNORMAL LOW (ref 3.87–5.11)
RDW: 13.5 % (ref 11.5–15.5)
WBC: 11 10*3/uL — AB (ref 4.0–10.5)

## 2016-07-23 LAB — URINE CULTURE: Culture: >=100000 — AB

## 2016-07-23 LAB — GLUCOSE, CAPILLARY
GLUCOSE-CAPILLARY: 122 mg/dL — AB (ref 65–99)
GLUCOSE-CAPILLARY: 315 mg/dL — AB (ref 65–99)

## 2016-07-23 MED ORDER — CEFPODOXIME PROXETIL 200 MG PO TABS
200.0000 mg | ORAL_TABLET | Freq: Two times a day (BID) | ORAL | Status: DC
  Administered 2016-07-23: 200 mg via ORAL
  Filled 2016-07-23 (×5): qty 1

## 2016-07-23 MED ORDER — CEFPODOXIME PROXETIL 200 MG PO TABS: 200.0000 mg | ORAL_TABLET | Freq: Two times a day (BID) | ORAL | 0 refills | Status: DC

## 2016-07-23 MED ORDER — AZITHROMYCIN 250 MG PO TABS: 500.0000 mg | ORAL_TABLET | Freq: Every day | ORAL | Status: DC

## 2016-07-23 MED ORDER — AZITHROMYCIN 500 MG PO TABS: 500.0000 mg | ORAL_TABLET | Freq: Every day | ORAL | 0 refills | Status: DC

## 2016-07-23 NOTE — Progress Notes (Signed)
Pt IV removed due to leaking. Tolerated well.

## 2016-07-23 NOTE — Progress Notes (Signed)
Pharmacy Antibiotic Note  [name] is a 82 y.o. female admitted on 07/19/2016 with pneumonia / UTI.  1/2 Blood cx's (+). Pharmacy has been consulted for renal dose antibiotics. She was started on rocephin and azithromycin.  Neither require renal adjustment.  MD aware of blood cx.  Afebrile. Ucx with EColi S to Rocephin  Plan: Continue Rocephin 1gm IV q24hrs Zithromax 500mg  PO q24hrs Duration of therapy per MD  PHARMACIST - PHYSICIAN COMMUNICATION CONCERNING: Antibiotic IV to Oral Route Change Policy  RECOMMENDATION: This patient is receiving ZITHROMAX by the intravenous route.  Based on criteria approved by the Pharmacy and Therapeutics Committee, the antibiotic(s) is/are being converted to the equivalent oral dose form(s).  DESCRIPTION: These criteria include:  Patient being treated for a respiratory tract infection, urinary tract infection, cellulitis or clostridium difficile associated diarrhea if on metronidazole  The patient is not neutropenic and does not exhibit a GI malabsorption state  The patient is eating (either orally or via tube) and/or has been taking other orally administered medications for a least 24 hours  The patient is improving clinically and has a Tmax < 100.5  If you have questions about this conversion, please contact the Pharmacy Department  [x]   [phone] )  [facility]  Recent Results (from the past 240 hour(s))  Urine culture     Status: Abnormal   Collection Time: 07/19/16  6:45 PM  Result Value Ref Range Status   Specimen Description URINE, CATHETERIZED  Final   Special Requests NONE  Final   Culture >=100,000 COLONIES/mL ESCHERICHIA COLI (A)  Final   Report Status 07/23/2016 FINAL  Final   Organism ID, Bacteria ESCHERICHIA COLI (A)  Final      Susceptibility   Escherichia coli - MIC*    AMPICILLIN >=32 RESISTANT Resistant     CEFAZOLIN 16 SENSITIVE Sensitive     CEFTRIAXONE <=1 SENSITIVE Sensitive     CIPROFLOXACIN >=4 RESISTANT  Resistant     GENTAMICIN <=1 SENSITIVE Sensitive     IMIPENEM <=0.25 SENSITIVE Sensitive     NITROFURANTOIN <=16 SENSITIVE Sensitive     TRIMETH/SULFA >=320 RESISTANT Resistant     AMPICILLIN/SULBACTAM 16 INTERMEDIATE Intermediate     PIP/TAZO <=4 SENSITIVE Sensitive     Extended ESBL NEGATIVE Sensitive     * >=100,000 COLONIES/mL ESCHERICHIA COLI  Blood Culture (routine x 2)     Status: Abnormal (Preliminary result)   Collection Time: 07/19/16  7:47 PM  Result Value Ref Range Status   Specimen Description BLOOD RIGHT ARM DRAWN BY RN  Final   Special Requests   Final    BOTTLES DRAWN AEROBIC AND ANAEROBIC 6CC DRAWN BY RN   Culture  Setup Time   Final    GRAM POSITIVE COCCI Gram Stain Report Called to,Read Back By and Verified With: HOWARD,C ON 07/20/16 AT 2035 BY LOY,C PERFORMED AT [facility] ANAEROBIC BOTTLE ONLY    Culture (A)  Final    STAPHYLOCOCCUS SPECIES (COAGULASE NEGATIVE) THE SIGNIFICANCE OF ISOLATING THIS ORGANISM FROM A SINGLE SET OF BLOOD CULTURES WHEN MULTIPLE SETS ARE DRAWN IS UNCERTAIN. PLEASE NOTIFY THE MICROBIOLOGY DEPARTMENT WITHIN ONE WEEK IF SPECIATION AND SENSITIVITIES ARE REQUIRED. Performed at [facility]    Report Status PENDING  Incomplete  Blood Culture ID Panel (Reflexed)     Status: Abnormal   Collection Time: 07/19/16  7:47 PM  Result Value Ref Range Status   Enterococcus species NOT DETECTED NOT DETECTED Final   Listeria monocytogenes NOT DETECTED NOT DETECTED  Final   Staphylococcus species DETECTED (A) NOT DETECTED Final    Comment: CRITICAL RESULT CALLED TO, READ BACK BY AND VERIFIED WITH: M HAYES,PHARMD AT 0903 07/21/16 BY L BENFIELD    Staphylococcus aureus NOT DETECTED NOT DETECTED Final   Methicillin resistance NOT DETECTED NOT DETECTED Final   Streptococcus species NOT DETECTED NOT DETECTED Final   Streptococcus agalactiae NOT DETECTED NOT DETECTED Final   Streptococcus pneumoniae NOT DETECTED NOT DETECTED Final   Streptococcus pyogenes  NOT DETECTED NOT DETECTED Final   Acinetobacter baumannii NOT DETECTED NOT DETECTED Final   Enterobacteriaceae species NOT DETECTED NOT DETECTED Final   Enterobacter cloacae complex NOT DETECTED NOT DETECTED Final   Escherichia coli NOT DETECTED NOT DETECTED Final   Klebsiella oxytoca NOT DETECTED NOT DETECTED Final   Klebsiella pneumoniae NOT DETECTED NOT DETECTED Final   Proteus species NOT DETECTED NOT DETECTED Final   Serratia marcescens NOT DETECTED NOT DETECTED Final   Haemophilus influenzae NOT DETECTED NOT DETECTED Final   Neisseria meningitidis NOT DETECTED NOT DETECTED Final   Pseudomonas aeruginosa NOT DETECTED NOT DETECTED Final   Candida albicans NOT DETECTED NOT DETECTED Final   Candida glabrata NOT DETECTED NOT DETECTED Final   Candida krusei NOT DETECTED NOT DETECTED Final   Candida parapsilosis NOT DETECTED NOT DETECTED Final   Candida tropicalis NOT DETECTED NOT DETECTED Final    Comment: Performed at [facility]  Blood Culture (routine x 2)     Status: None (Preliminary result)   Collection Time: 07/19/16  7:50 PM  Result Value Ref Range Status   Specimen Description BLOOD RIGHT ARM  Final   Special Requests BOTTLES DRAWN AEROBIC AND ANAEROBIC 6CC  Final   Culture NO GROWTH 4 DAYS  Final   Report Status PENDING  Incomplete  MRSA PCR Screening     Status: None   Collection Time: 07/20/16  7:31 PM  Result Value Ref Range Status   MRSA by PCR NEGATIVE NEGATIVE Final    Comment:        The GeneXpert MRSA Assay (FDA approved for NASAL specimens only), is one component of a comprehensive MRSA colonization surveillance program. It is not intended to diagnose MRSA infection nor to guide or monitor treatment for MRSA infections.    Height: 5\' 10"  (177.8 cm) Weight: 107 lb 2.3 oz (48.6 kg) IBW/kg (Calculated) : 68.5  Temp (24hrs), Avg:98.4 F (36.9 C), Min:98 F (36.7 C), Max:98.7 F (37.1 C)   Recent Labs Lab 07/19/16 1747 07/19/16 2001  07/19/16 2203 07/21/16 0420 07/22/16 0450 07/23/16 0617  WBC 14.4*  --   --  11.3* 13.5* 11.0*  CREATININE 0.95  --   --  0.90 0.76 0.74  LATICACIDVEN  --  2.93* 4.05* 0.6  --   --     Estimated Creatinine Clearance: 37.3 mL/min (by C-G formula based on SCr of 0.74 mg/dL).    Allergies  Allergen Reactions  . Aspirin Other (See Comments)    Cannot take uncoated due to stomach ulcers   Thank you for allowing pharmacy to be a part of this patient's care.  Hart Robinsons A 07/23/2016 11:10 AM

## 2016-07-23 NOTE — Progress Notes (Signed)
PT Cancellation Note  Patient Details Name: [name] MRN: [id number] DOB: October 10, 1925   Cancelled Treatment:    Reason Eval/Treat Not Completed: PT screened, no needs identified, will sign off. Upon entry, the patient is received semirecumbent in bed, daughter present who is assisting patient with breakfast and helps with history. The pt is awake and agreeable to participate. No acute distress noted at this time. Pt report 0/10 pain at this time. Daughter reports patient has return to her baseline and does not appear nearly as weak as she was PTA. Pt is WC bound at baseline and requires total assist for toileting, dressing, and bathing, but is able to self feed. The patient requires total physical assistance for bed mobility transfers, and WC propulsion. The patient performs very limited WB and standing with supervision. The family is heavily involved in care and provides 24/7 care combined with PD personal assistant services. Family denies any need for DME updates. Family is performing daily ROM with patient and other supportive care. Patient/family deny need for any PT services at DC. No additional PT services needed at this time. Pt is appropriate to return to home wit 24/7 assistance. PT signing off.    8:26 AM, 07/23/16 Etta Grandchild, PT, DPT Physical Therapist at [facility] [phone] (office)

## 2016-07-23 NOTE — Discharge Instructions (Signed)
Follow with Primary MD Moshe Cipro, MD in 7 days   Get CBC, CMP, 2 view Chest X ray checked  by Primary MD or SNF MD in 5-7 days ( we routinely change or add medications that can affect your baseline labs and fluid status, therefore we recommend that you get the mentioned basic workup next visit with your PCP, your PCP may decide not to get them or add new tests based on their clinical decision)   Activity: As tolerated with Full fall precautions use walker/cane & assistance as needed   Disposition Home     Diet:   Heart Healthy Low Carb with feeding assistance and aspiration precautions.  For Heart failure patients - Check your Weight same time everyday, if you gain over 2 pounds, or you develop in leg swelling, experience more shortness of breath or chest pain, call your Primary MD immediately. Follow Cardiac Low Salt Diet and 1.5 lit/day fluid restriction.   On your next visit with your primary care physician please Get Medicines reviewed and adjusted.   Please request your Prim.MD to go over all Hospital Tests and Procedure/Radiological results at the follow up, please get all Hospital records sent to your Prim MD by signing hospital release before you go home.   If you experience worsening of your admission symptoms, develop shortness of breath, life threatening emergency, suicidal or homicidal thoughts you must seek medical attention immediately by calling 911 or calling your MD immediately  if symptoms less severe.  You Must read complete instructions/literature along with all the possible adverse reactions/side effects for all the Medicines you take and that have been prescribed to you. Take any new Medicines after you have completely understood and accpet all the possible adverse reactions/side effects.   Do not drive, operate heavy machinery, perform activities at heights, swimming or participation in water activities or provide baby sitting services if your were admitted for  syncope or siezures until you have seen by Primary MD or a Neurologist and advised to do so again.  Do not drive when taking Pain medications.    Do not take more than prescribed Pain, Sleep and Anxiety Medications  Special Instructions: If you have smoked or chewed Tobacco  in the last 2 yrs please stop smoking, stop any regular Alcohol  and or any Recreational drug use.  Wear Seat belts while driving.   Please note  You were cared for by a hospitalist during your hospital stay. If you have any questions about your discharge medications or the care you received while you were in the hospital after you are discharged, you can call the unit and asked to speak with the hospitalist on call if the hospitalist that took care of you is not available. Once you are discharged, your primary care physician will handle any further medical issues. Please note that NO REFILLS for any discharge medications will be authorized once you are discharged, as it is imperative that you return to your primary care physician (or establish a relationship with a primary care physician if you do not have one) for your aftercare needs so that they can reassess your need for medications and monitor your lab values.

## 2016-07-23 NOTE — Discharge Summary (Addendum)
[name] [id number] DOB: [birth year] DOA: 07/19/2016  PCP: Moshe Cipro, MD  Admit date: 07/19/2016  Discharge date: 07/23/2016  Admitted From: Home   Disposition:  Home   Recommendations for Outpatient Follow-up:   Follow up with PCP in 1-2 weeks  PCP Please obtain BMP/CBC, 2 view CXR in 1week,  (see Discharge instructions)   PCP Please follow up on the following pending results: None   Home Health: None ( family refused)    Equipment/Devices: None Consultations: None Discharge Condition: Fair   CODE STATUS: Full   Diet Recommendation: Heart Healthy Low Carb   Chief Complaint  Patient presents with  . Fever  . Chest Pain     Brief history of present illness from the day of admission and additional interim summary    Sabrina Womackis a 82 y.o.femalewith a history of breast cancer, anemia, acid reflux, diabetes, hypertension, rheumatoid arthritis, kyphosis. Patient with history of UTIs. Patient has been hospitalized twice in the past 6 months for UTI with sepsis. Started having abdominal pain 2-3 days ago, then began to have some mild confusion with a fever today. Patient's daughters brought her here for evaluation. No palliating or provoking factors.   Hospital issues addressed     Sepsis due to UTI & PNA - sepsis resolved, Blood culture 1/2 +ve  Was contamination with coag positive Staph, urine culture grew Escherichia coli sensitive to Rocephin, she is back to baseline and symptom-free, will be placed on 3 more days of Vantin and azithromycin and discharged home. Asian and family wanted to go home and they do not want placement, home health or PT/RN. Denies any episodes of choking or symptoms suggestive of aspiration. PCP to recheck 2 view chest x-ray in 7-10 days along with CBC and  BMP.  Chronic stage I sacral decubitus ulcer. Family does frequent turning and skin care at home, now upto Stage 3, family refused [place].  DM type II. Continue home medications, will request PCP to reconsider metformin in the light of patient's age.  Essential hypertension. Continue home medications unchanged.   Discharge diagnosis     Principal Problem:   Sepsis ([place]) Active Problems:   HTN (hypertension)   Diabetes type 2, controlled ([place])   UTI (urinary tract infection)   CAP (community acquired pneumonia)   Pressure ulcer   Lactic acidosis    Discharge instructions    Discharge Instructions    Discharge instructions    Complete by:  As directed    Follow with Primary MD Moshe Cipro, MD in 7 days   Get CBC, CMP, 2 view Chest X ray checked  by Primary MD or SNF MD in 5-7 days ( we routinely change or add medications that can affect your baseline labs and fluid status, therefore we recommend that you get the mentioned basic workup next visit with your PCP, your PCP may decide not to get them or add new tests based on their clinical decision)   Activity: As tolerated with Full fall precautions use walker/cane & assistance as needed  Disposition Home     Diet:   Heart Healthy Low Carb with feeding assistance and aspiration precautions.  For Heart failure patients - Check your Weight same time everyday, if you gain over 2 pounds, or you develop in leg swelling, experience more shortness of breath or chest pain, call your Primary MD immediately. Follow Cardiac Low Salt Diet and 1.5 lit/day fluid restriction.   On your next visit with your primary care physician please Get Medicines reviewed and adjusted.   Please request your Prim.MD to go over all Hospital Tests and Procedure/Radiological results at the follow up, please get all Hospital records sent to your Prim MD by signing hospital release before you go home.   If you experience worsening of your admission  symptoms, develop shortness of breath, life threatening emergency, suicidal or homicidal thoughts you must seek medical attention immediately by calling 911 or calling your MD immediately  if symptoms less severe.  You Must read complete instructions/literature along with all the possible adverse reactions/side effects for all the Medicines you take and that have been prescribed to you. Take any new Medicines after you have completely understood and accpet all the possible adverse reactions/side effects.   Do not drive, operate heavy machinery, perform activities at heights, swimming or participation in water activities or provide baby sitting services if your were admitted for syncope or siezures until you have seen by Primary MD or a Neurologist and advised to do so again.  Do not drive when taking Pain medications.    Do not take more than prescribed Pain, Sleep and Anxiety Medications  Special Instructions: If you have smoked or chewed Tobacco  in the last 2 yrs please stop smoking, stop any regular Alcohol  and or any Recreational drug use.  Wear Seat belts while driving.   Please note  You were cared for by a hospitalist during your hospital stay. If you have any questions about your discharge medications or the care you received while you were in the hospital after you are discharged, you can call the unit and asked to speak with the hospitalist on call if the hospitalist that took care of you is not available. Once you are discharged, your primary care physician will handle any further medical issues. Please note that NO REFILLS for any discharge medications will be authorized once you are discharged, as it is imperative that you return to your primary care physician (or establish a relationship with a primary care physician if you do not have one) for your aftercare needs so that they can reassess your need for medications and monitor your lab values.   Increase activity slowly     Complete by:  As directed       Discharge Medications     Medication List    TAKE these medications   aspirin EC 81 MG tablet Take 81 mg by mouth daily.   atorvastatin 20 MG tablet Commonly known as:  LIPITOR Take 20 mg by mouth at bedtime.   azithromycin 500 MG tablet Commonly known as:  ZITHROMAX Take 1 tablet (500 mg total) by mouth daily.   benzonatate 100 MG capsule Commonly known as:  TESSALON Take 1 capsule (100 mg total) by mouth 3 (three) times daily as needed for cough.   budesonide-formoterol 160-4.5 MCG/ACT inhaler Commonly known as:  SYMBICORT Inhale 2 puffs into the lungs 2 (two) times daily.   cefpodoxime 200 MG tablet Commonly known as:  VANTIN Take 1 tablet (200 mg total)  by mouth every 12 (twelve) hours.   CENTRUM SILVER ADULT 50+ PO Take 1 tablet by mouth every morning.   ciclopirox 8 % solution Commonly known as:  PENLAC APPLY TO FUNGAL TOENAILS EVERY DAY   CLEAR EYES ALL SEASONS 5-6 MG/ML Soln Generic drug:  Polyvinyl Alcohol-Povidone Apply 1 drop to eye every morning.   CORICIDIN HBP COLD/FLU PO Take 1 capsule by mouth every 6 (six) hours as needed (for cold symptoms).   ipratropium 0.02 % nebulizer solution Commonly known as:  ATROVENT Take 500 mcg by nebulization daily. With albuterol as needed for shortness of breath   lactobacillus acidophilus & bulgar chewable tablet CHEW 1 TABLET BY MOUTH EVERY DAY   magnesium oxide 400 (241.3 Mg) MG tablet Commonly known as:  MAG-OX Take 0.5 tablets (200 mg total) by mouth daily.   metFORMIN 500 MG tablet Commonly known as:  GLUCOPHAGE Take 500 mg by mouth 2 (two) times daily with a meal.   metoprolol succinate 25 MG 24 hr tablet Commonly known as:  TOPROL XL Take 1 tablet (25 mg total) by mouth daily. What changed:  additional instructions   pantoprazole 40 MG tablet Commonly known as:  PROTONIX Take 40 mg by mouth daily.   predniSONE 5 MG tablet Commonly known as:  DELTASONE Take  5 mg by mouth daily with breakfast.   PROVENTIL HFA 108 (90 Base) MCG/ACT inhaler Generic drug:  albuterol Inhale 2 puffs into the lungs every 6 (six) hours as needed for shortness of breath.   albuterol (2.5 MG/3ML) 0.083% nebulizer solution Commonly known as:  PROVENTIL Take 2.5 mg by nebulization daily. With Ipratropium as needed for shortness of breath   raloxifene 60 MG tablet Commonly known as:  EVISTA Take 60 mg by mouth every morning.   tamoxifen 20 MG tablet Commonly known as:  NOLVADEX Take 1 tablet (20 mg total) by mouth daily.   TRADJENTA 5 MG Tabs tablet Generic drug:  linagliptin Take 5 mg by mouth every morning.   traMADol 50 MG tablet Commonly known as:  ULTRAM Take 50 mg by mouth every 6 (six) hours as needed for moderate pain.   Vitamin D3 1000 units Caps Take 2,000 Units by mouth daily.       Follow-up Information    CAPLAN,MICHAEL, MD. Schedule an appointment as soon as possible for a visit in 1 week(s).   Specialty:  Internal Medicine Contact information: [address] [place] [address] [phone]           Major procedures and Radiology Reports - PLEASE review detailed and final reports thoroughly  -         Dg Chest Portable 1 View  Result Date: 07/19/2016 CLINICAL DATA:  Acute onset of cough, fever and generalized chest pain. Initial encounter. EXAM: PORTABLE CHEST 1 VIEW COMPARISON:  Chest radiograph performed 04/13/2016 FINDINGS: The lungs are well-aerated. There is mild elevation of the left hemidiaphragm. The left lung apex is obscured by the patient's head. Mild right midlung opacity raises concern for pneumonia. There is no evidence of pleural effusion or pneumothorax. The cardiomediastinal silhouette is within normal limits. No acute osseous abnormalities are seen. Clips are seen overlying the left axilla. IMPRESSION: Mild right midlung airspace opacity raises concern for pneumonia. Mild elevation of the left  hemidiaphragm. Electronically Signed   By: Garald Balding M.D.   On: 07/19/2016 19:31    Micro Results     Recent Results (from the past 240 hour(s))  Urine  culture     Status: Abnormal   Collection Time: 07/19/16  6:45 PM  Result Value Ref Range Status   Specimen Description URINE, CATHETERIZED  Final   Special Requests NONE  Final   Culture >=100,000 COLONIES/mL ESCHERICHIA COLI (A)  Final   Report Status 07/23/2016 FINAL  Final   Organism ID, Bacteria ESCHERICHIA COLI (A)  Final      Susceptibility   Escherichia coli - MIC*    AMPICILLIN >=32 RESISTANT Resistant     CEFAZOLIN 16 SENSITIVE Sensitive     CEFTRIAXONE <=1 SENSITIVE Sensitive     CIPROFLOXACIN >=4 RESISTANT Resistant     GENTAMICIN <=1 SENSITIVE Sensitive     IMIPENEM <=0.25 SENSITIVE Sensitive     NITROFURANTOIN <=16 SENSITIVE Sensitive     TRIMETH/SULFA >=320 RESISTANT Resistant     AMPICILLIN/SULBACTAM 16 INTERMEDIATE Intermediate     PIP/TAZO <=4 SENSITIVE Sensitive     Extended ESBL NEGATIVE Sensitive     * >=100,000 COLONIES/mL ESCHERICHIA COLI  Blood Culture (routine x 2)     Status: Abnormal (Preliminary result)   Collection Time: 07/19/16  7:47 PM  Result Value Ref Range Status   Specimen Description BLOOD RIGHT ARM DRAWN BY RN  Final   Special Requests   Final    BOTTLES DRAWN AEROBIC AND ANAEROBIC 6CC DRAWN BY RN   Culture  Setup Time   Final    GRAM POSITIVE COCCI Gram Stain Report Called to,Read Back By and Verified With: HOWARD,C ON 07/20/16 AT 2035 BY LOY,C PERFORMED AT [facility] ANAEROBIC BOTTLE ONLY    Culture (A)  Final    STAPHYLOCOCCUS SPECIES (COAGULASE NEGATIVE) THE SIGNIFICANCE OF ISOLATING THIS ORGANISM FROM A SINGLE SET OF BLOOD CULTURES WHEN MULTIPLE SETS ARE DRAWN IS UNCERTAIN. PLEASE NOTIFY THE MICROBIOLOGY DEPARTMENT WITHIN ONE WEEK IF SPECIATION AND SENSITIVITIES ARE REQUIRED. Performed at [facility]    Report Status PENDING  Incomplete  Blood Culture ID Panel (Reflexed)      Status: Abnormal   Collection Time: 07/19/16  7:47 PM  Result Value Ref Range Status   Enterococcus species NOT DETECTED NOT DETECTED Final   Listeria monocytogenes NOT DETECTED NOT DETECTED Final   Staphylococcus species DETECTED (A) NOT DETECTED Final    Comment: CRITICAL RESULT CALLED TO, READ BACK BY AND VERIFIED WITH: M HAYES,PHARMD AT 0903 07/21/16 BY L BENFIELD    Staphylococcus aureus NOT DETECTED NOT DETECTED Final   Methicillin resistance NOT DETECTED NOT DETECTED Final   Streptococcus species NOT DETECTED NOT DETECTED Final   Streptococcus agalactiae NOT DETECTED NOT DETECTED Final   Streptococcus pneumoniae NOT DETECTED NOT DETECTED Final   Streptococcus pyogenes NOT DETECTED NOT DETECTED Final   Acinetobacter baumannii NOT DETECTED NOT DETECTED Final   Enterobacteriaceae species NOT DETECTED NOT DETECTED Final   Enterobacter cloacae complex NOT DETECTED NOT DETECTED Final   Escherichia coli NOT DETECTED NOT DETECTED Final   Klebsiella oxytoca NOT DETECTED NOT DETECTED Final   Klebsiella pneumoniae NOT DETECTED NOT DETECTED Final   Proteus species NOT DETECTED NOT DETECTED Final   Serratia marcescens NOT DETECTED NOT DETECTED Final   Haemophilus influenzae NOT DETECTED NOT DETECTED Final   Neisseria meningitidis NOT DETECTED NOT DETECTED Final   Pseudomonas aeruginosa NOT DETECTED NOT DETECTED Final   Candida albicans NOT DETECTED NOT DETECTED Final   Candida glabrata NOT DETECTED NOT DETECTED Final   Candida krusei NOT DETECTED NOT DETECTED Final   Candida parapsilosis NOT DETECTED NOT DETECTED Final   Candida  tropicalis NOT DETECTED NOT DETECTED Final    Comment: Performed at [facility]  Blood Culture (routine x 2)     Status: None (Preliminary result)   Collection Time: 07/19/16  7:50 PM  Result Value Ref Range Status   Specimen Description BLOOD RIGHT ARM  Final   Special Requests BOTTLES DRAWN AEROBIC AND ANAEROBIC 6CC  Final   Culture NO GROWTH 4 DAYS   Final   Report Status PENDING  Incomplete  MRSA PCR Screening     Status: None   Collection Time: 07/20/16  7:31 PM  Result Value Ref Range Status   MRSA by PCR NEGATIVE NEGATIVE Final    Comment:        The GeneXpert MRSA Assay (FDA approved for NASAL specimens only), is one component of a comprehensive MRSA colonization surveillance program. It is not intended to diagnose MRSA infection nor to guide or monitor treatment for MRSA infections.     Today   Subjective    Paytin Effinger today has no headache,no chest abdominal pain,no new weakness tingling or numbness, feels much better wants to go home today.    Objective   Blood pressure (!) 165/71, pulse 78, temperature 98.5 F (36.9 C), temperature source Oral, resp. rate 20, height 5\' 10"  (1.778 m), weight 48.6 kg (107 lb 2.3 oz), SpO2 97 %.   Intake/Output Summary (Last 24 hours) at 07/23/16 1154 Last data filed at 07/23/16 0900  Gross per 24 hour  Intake              240 ml  Output                0 ml  Net              240 ml    Exam Awake Alert, Oriented x 3, No new F.N deficits, Normal affect [place].AT,PERRAL Supple Neck,No JVD, No cervical lymphadenopathy appriciated.  Symmetrical Chest wall movement, Good air movement bilaterally, CTAB RRR,No Gallops,Rubs or new Murmurs, No Parasternal Heave +ve B.Sounds, Abd Soft, Non tender, No organomegaly appriciated, No rebound -guarding or rigidity. No Cyanosis, Clubbing or edema, No new Rash or bruise   Data Review   CBC w Diff: Lab Results  Component Value Date   WBC 11.0 (H) 07/23/2016   HGB 9.0 (L) 07/23/2016   HCT 28.9 (L) 07/23/2016   PLT 159 07/23/2016   LYMPHOPCT 13 07/19/2016   MONOPCT 4 07/19/2016   EOSPCT 0 07/19/2016   BASOPCT 0 07/19/2016    CMP: Lab Results  Component Value Date   NA 138 07/23/2016   K 3.9 07/23/2016   CL 105 07/23/2016   CO2 28 07/23/2016   BUN 11 07/23/2016   CREATININE 0.74 07/23/2016   PROT 6.7 07/19/2016   ALBUMIN 2.9  (L) 07/19/2016   BILITOT 0.3 07/19/2016   ALKPHOS 70 07/19/2016   AST 20 07/19/2016   ALT 17 07/19/2016  .   Total Time in preparing paper work, data evaluation and todays exam - 35 minutes  Thurnell Lose M.D on 07/23/2016 at 11:54 AM  [facility]   Office  [phone]

## 2016-07-23 NOTE — Care Management Important Message (Signed)
Important Message  Patient Details  Name: Elva Cuadrado MRN: [id number] Date of Birth: November 25, 1925   Medicare Important Message Given:  Yes    Bell Cai, Chauncey [place], RN 07/23/2016, 11:30 AM

## 2016-07-23 NOTE — Progress Notes (Signed)
Reviewed discharge instructions with pt and family.  Answered all questions at this time.  Pt discharged with family to home.

## 2016-07-24 LAB — CULTURE, BLOOD (ROUTINE X 2): CULTURE: NO GROWTH

## 2016-08-14 ENCOUNTER — Other Ambulatory Visit (HOSPITAL_COMMUNITY): Payer: Self-pay | Admitting: Oncology

## 2016-08-14 DIAGNOSIS — C50412 Malignant neoplasm of upper-outer quadrant of left female breast: Secondary | ICD-10-CM

## 2016-08-18 ENCOUNTER — Other Ambulatory Visit (HOSPITAL_COMMUNITY): Payer: Self-pay | Admitting: Oncology

## 2016-08-26 IMAGING — CR DG CHEST 2V
2 series · 2 of 2 positions shown · non-contrast
Comparison: 04/11/2016 and earlier.

CLINICAL DATA: 89-year-old female with Healthcare associated
pneumonia and fever. Initial encounter.

EXAM:
CHEST  2 VIEW

[pa]
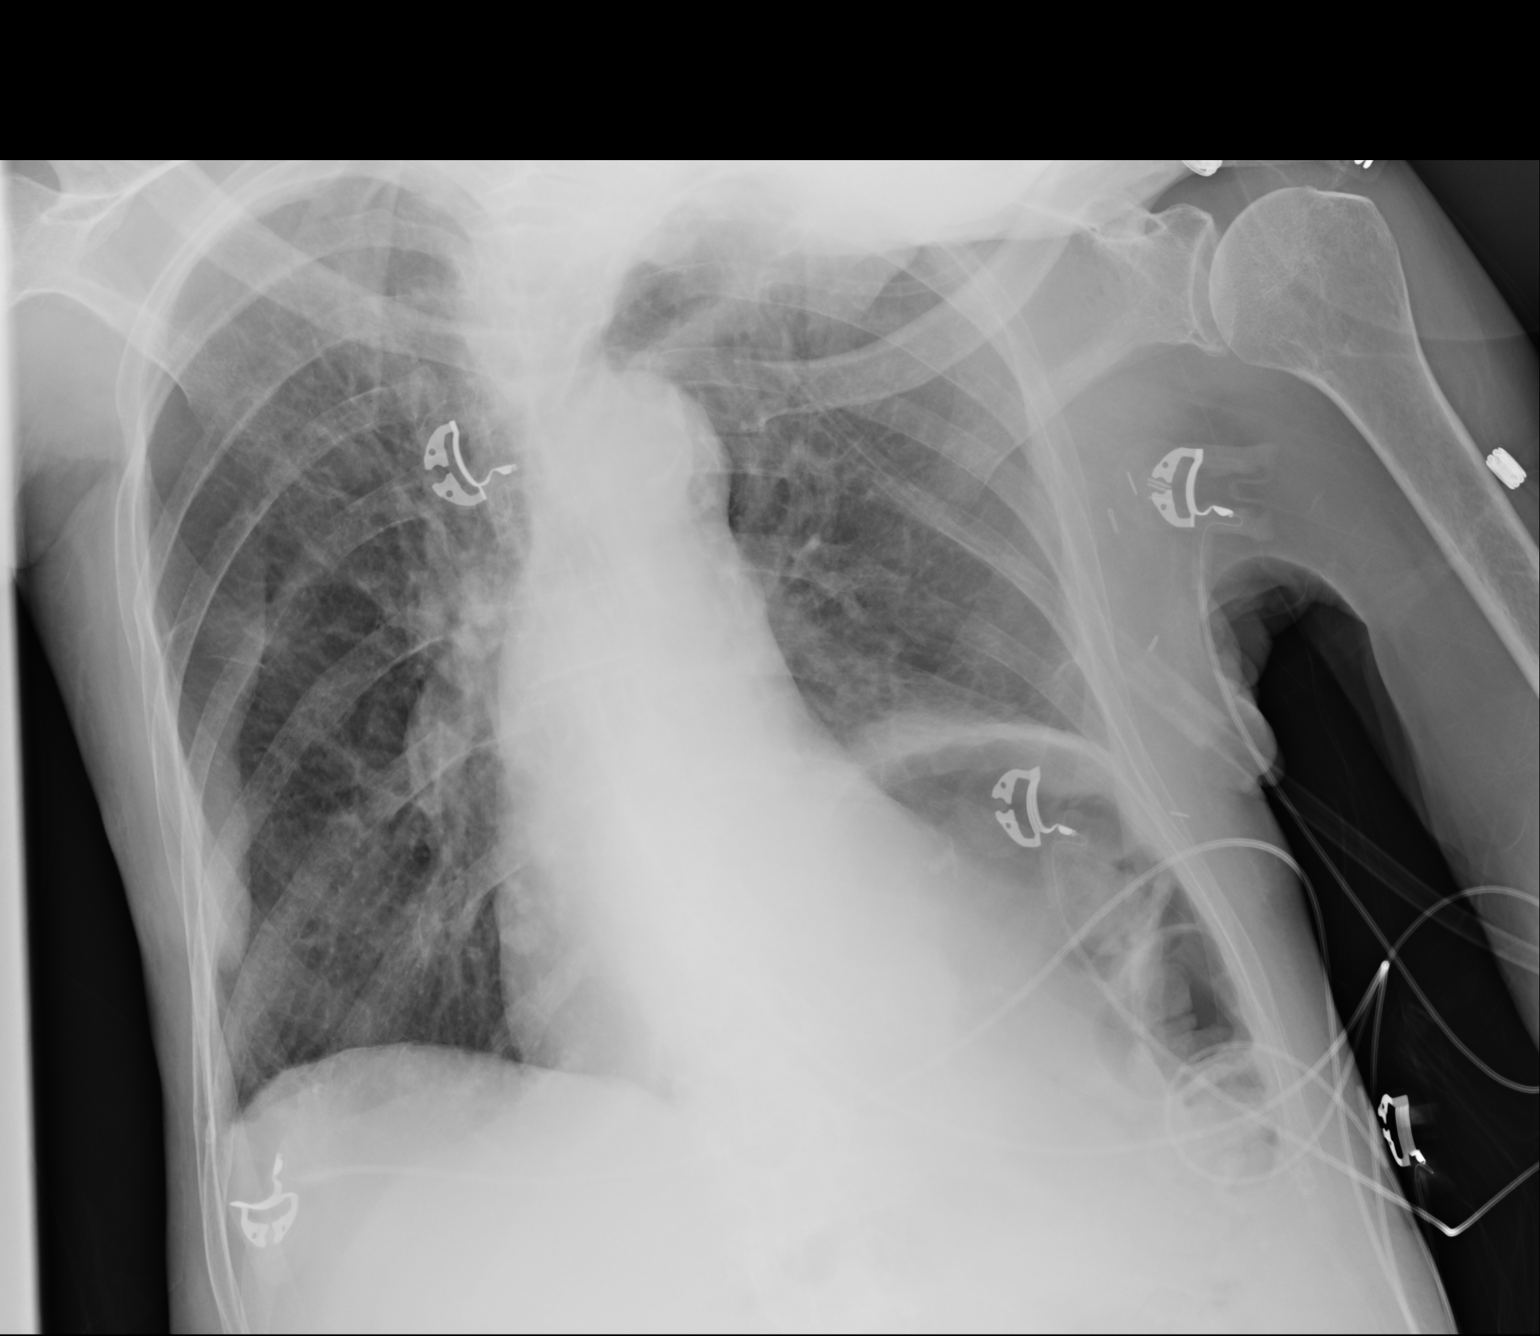

[ap portable]
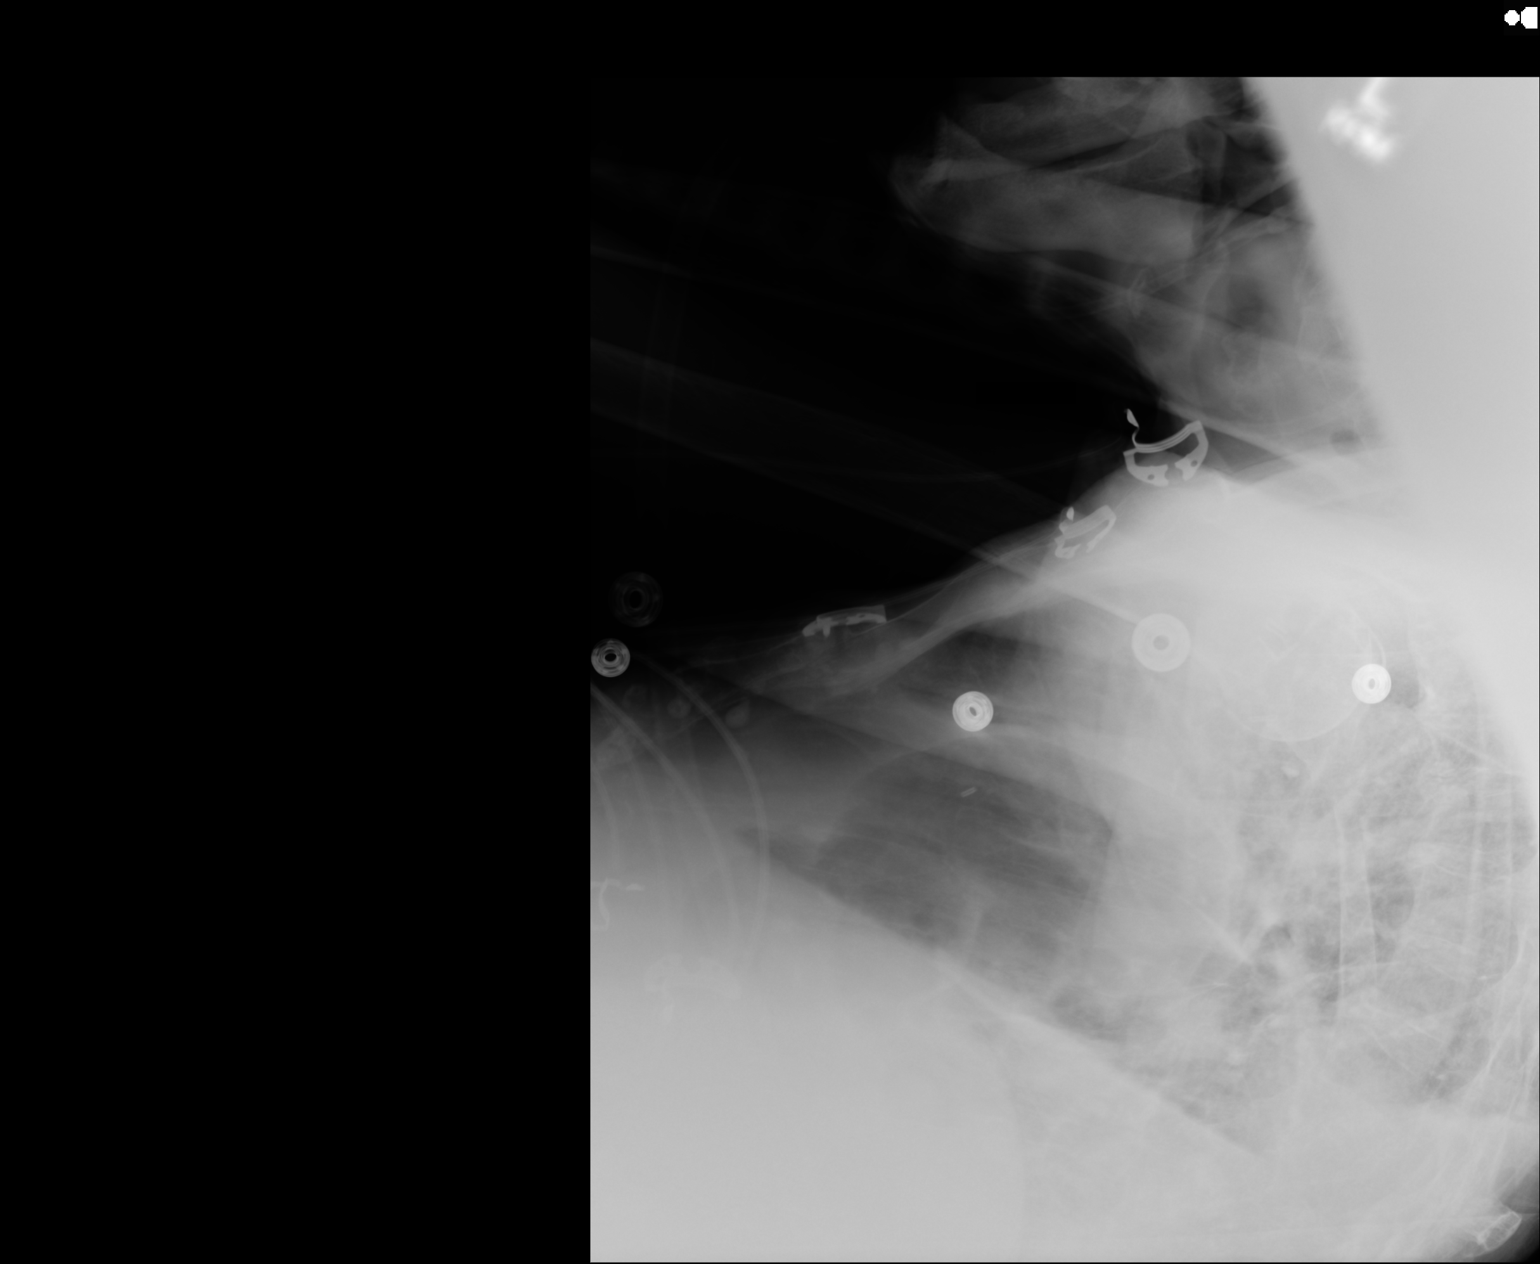

[2 of 2 positions shown; findings below may reference images not displayed]

FINDINGS: Seated AP and lateral views of the chest. Lateral view limited by
patient positioning and decreased mobility. Chronic moderate
elevation of the left hemidiaphragm has not significantly changed
since 5974. Significantly improved left lung ventilation since [REDACTED]
with mild residual left base opacity. Possible small pleural
effusions. Stable cardiac size and mediastinal contours. Stable
right lung.

Calcified aortic atherosclerosis. Chronic postoperative changes to
the left axilla and chest wall.
IMPRESSION: 1. Significantly improved left lung ventilation since [REDACTED]. Mild
opacity at the left base could reflect residual infection or
atelectasis.
2. No new cardiopulmonary abnormality identified.

## 2016-09-23 ENCOUNTER — Encounter (HOSPITAL_COMMUNITY): Payer: Medicare Other

## 2016-09-23 ENCOUNTER — Encounter (HOSPITAL_COMMUNITY): Payer: Medicare Other | Attending: Oncology | Admitting: Oncology

## 2016-09-23 ENCOUNTER — Encounter (HOSPITAL_COMMUNITY): Payer: Self-pay | Admitting: Oncology

## 2016-09-23 VITALS — BP 89/52 | HR 86 | Resp 16 | Wt 106.0 lb

## 2016-09-23 DIAGNOSIS — Z170 Estrogen receptor positive status [ER+]: Secondary | ICD-10-CM | POA: Insufficient documentation

## 2016-09-23 DIAGNOSIS — C50412 Malignant neoplasm of upper-outer quadrant of left female breast: Secondary | ICD-10-CM | POA: Diagnosis present

## 2016-09-23 DIAGNOSIS — E611 Iron deficiency: Secondary | ICD-10-CM | POA: Insufficient documentation

## 2016-09-23 DIAGNOSIS — D649 Anemia, unspecified: Secondary | ICD-10-CM

## 2016-09-23 LAB — CBC WITH DIFFERENTIAL/PLATELET
Basophils Absolute: 0.1 10*3/uL (ref 0.0–0.1)
Basophils Relative: 0 %
EOS ABS: 0.4 10*3/uL (ref 0.0–0.7)
EOS PCT: 3 %
HCT: 34.4 % — ABNORMAL LOW (ref 36.0–46.0)
Hemoglobin: 10.5 g/dL — ABNORMAL LOW (ref 12.0–15.0)
LYMPHS ABS: 2.1 10*3/uL (ref 0.7–4.0)
LYMPHS PCT: 19 %
MCH: 28.7 pg (ref 26.0–34.0)
MCHC: 30.5 g/dL (ref 30.0–36.0)
MCV: 94 fL (ref 78.0–100.0)
MONO ABS: 0.7 10*3/uL (ref 0.1–1.0)
MONOS PCT: 6 %
Neutro Abs: 8 10*3/uL — ABNORMAL HIGH (ref 1.7–7.7)
Neutrophils Relative %: 72 %
PLATELETS: 283 10*3/uL (ref 150–400)
RBC: 3.66 MIL/uL — ABNORMAL LOW (ref 3.87–5.11)
RDW: 13.9 % (ref 11.5–15.5)
WBC: 11.2 10*3/uL — ABNORMAL HIGH (ref 4.0–10.5)

## 2016-09-23 LAB — COMPREHENSIVE METABOLIC PANEL
ALK PHOS: 56 U/L (ref 38–126)
ALT: 13 U/L — AB (ref 14–54)
AST: 17 U/L (ref 15–41)
Albumin: 2.8 g/dL — ABNORMAL LOW (ref 3.5–5.0)
Anion gap: 7 (ref 5–15)
BILIRUBIN TOTAL: 0.1 mg/dL — AB (ref 0.3–1.2)
BUN: 16 mg/dL (ref 6–20)
CALCIUM: 9.3 mg/dL (ref 8.9–10.3)
CO2: 33 mmol/L — AB (ref 22–32)
Chloride: 98 mmol/L — ABNORMAL LOW (ref 101–111)
Creatinine, Ser: 0.73 mg/dL (ref 0.44–1.00)
GFR calc Af Amer: >60 mL/min (ref >60)
GFR calc non Af Amer: >60 mL/min (ref >60)
GLUCOSE: 157 mg/dL — AB (ref 65–99)
POTASSIUM: 4.3 mmol/L (ref 3.5–5.1)
Sodium: 138 mmol/L (ref 135–145)
TOTAL PROTEIN: 6.3 g/dL — AB (ref 6.5–8.1)

## 2016-09-23 MED ORDER — POLYSACCHAR IRON-FA-B12 150-1-25 MG-MG-MCG PO CAPS: 1.0000 | ORAL_CAPSULE | Freq: Every day | ORAL | 5 refills | Status: DC

## 2016-09-23 NOTE — Assessment & Plan Note (Addendum)
Stage IIA  Left breast cancer, ER-, PR 1%+, Her2 -.  S/P left breast mastectomy on 05/09/2015.  Due to inability to lay flat, she is not a candidate for radiation therapy.  She is a poor systemic chemotherapy candidate as well.  Currently on Tamoxifen 20 mg daily.  Oncology history is updated.   Labs today: CBC diff, CMET.  I personally reviewed and went over laboratory results with the patient.  The results are noted within this dictation.  Normocytic, normochromic anemia is stable at 10.5 g/dL.  The patient's family reports that they saw [facility] who recommended discontinuation of PO ferrous sulfate and recommended IV iron.  Given her difficulty with ambulation and transfer, IV iron would not be a good option in the event of an anaphylaxis situation (even though risk is low).  It is reported that ferrous sulfate causes constipation.  I will escribe ferrex forte due to the lower incidence of constipation.  If not tolerated, the IV iron may be indicated.  Ferritin and iron/TIBC cannot be added to today's blood work.  We will try to get copy of labs from [facility] hematology.  Labs in 4 months: CBC diff, CMET, iron/TIBC, ferritin.  Chart is reviewed and recent hospitalization in October 2017 for Sepsis from UTI is noted.    Return in 4 months for follow-up.

## 2016-09-23 NOTE — Progress Notes (Signed)
Sabrina Cipro, MD [address] [place] [id number]  Malignant neoplasm of upper-outer quadrant of left breast in female, estrogen receptor positive ([place]) - Plan: CBC with Differential, Comprehensive metabolic panel  Iron deficiency - Plan: Polysacchar Iron-FA-B12 (FERREX 150 FORTE) 150-1-25 MG-MG-MCG CAPS, Iron and TIBC, Ferritin  CURRENT THERAPY: Tamoxifen 20 mg daily beginning on 05/22/2015.    INTERVAL HISTORY: [name] 82 y.o. female returns for followup of Stage IIA  Left breast cancer, ER-, PR 1%+, Her2 -.  She is S/P mastectomy on 05/09/2015 and now on anti-estrogen therapy consisting of Tamoxifen beginning on 05/22/2015.    Breast cancer of upper-outer quadrant of left female breast ([place])   03/20/2015 Initial Diagnosis    Breast, left, needle core biopsy, upper outer quadrant - INVASIVE DUCTAL CARCINOMA. - LYMPHOVASCULAR INVASION IS IDENTIFIED.      05/09/2015 Definitive Surgery    Diagnosis 1. Breast, simple mastectomy, Left - INVASIVE GRADE III DUCTAL CARCINOMA, SPANNING 4.8 CM IN GREATEST DIMENSION. - MARGINS ARE NEGATIVE. - SEE ONCOLOGY TEMPLATE. 2. Lymph nodes, regional resection, Left axillary contents - FIVE BENIGN LYMP      05/22/2015 -  Anti-estrogen oral therapy    Tamoxifen 20 mg daily      07/19/2016 - 07/23/2016 Hospital Admission    Admit date: 07/19/2016 Admission diagnosis: Sepsis Additional comments: Sepsis due to UTI      She is tolerating Tamoxifen well.  She denies any arthralgias, myalgias, hot flashes, VTE symptoms, and vaginal bleeding.  The patient is compliant with the medication and denies any complaints.  She recently saw [facility], the patient's daughters report.  They report that they were referred by her primary care provider.  They did see a hematologist, they report, and they were advised to discontinue ferrous sulfate due to complications with constipation.  They reports iron studies being drawn and IV iron  was recommended.  We will try to get a copy of the aforementioned labs.  I have reviewed the risks, benefits, alternatives, and side effects of IV iron with the patient and family.  I expressed my concern with the patient's ability to transfer to a recliner which is necessary prior to IV iron due to the risk (although low) of anaphylaxis.  Given her co-morbidities, certainly IV iron is not contraindicated, PO iron is recommended.  I have recommended Rx strength PO iron due to improved tolerability, less side effects, and convenience.  Review of Systems  Constitutional: Negative.  Negative for chills and fever.  HENT: Negative.   Respiratory: Negative.  Negative for cough.   Cardiovascular: Negative.  Negative for chest pain.  Gastrointestinal: Negative.  Negative for constipation, diarrhea, nausea and vomiting.  Genitourinary: Negative.  Negative for dysuria.  Musculoskeletal: Negative.  Negative for falls, joint pain and myalgias.  Skin: Negative.   Neurological: Negative.     Past Medical History:  Diagnosis Date  . Acid reflux   . Anemia   . Arthritis    ra  . Asthma   . Cancer [facility])    breast cancer  . Diabetes mellitus without complication ([place])   . Diverticulitis   . Gastric ulcer   . Hypertension   . Kyphosis   . Raynaud disease     Past Surgical History:  Procedure Laterality Date  . BREAST SURGERY    . HERNIA REPAIR    . MASTECTOMY Right 1978  . SIMPLE MASTECTOMY WITH AXILLARY SENTINEL NODE BIOPSY Left 05/09/2015   Procedure:  LEFT SIMPLE MASTECTOMY;  Surgeon: Erroll Luna, MD;  Location: [facility] OR;  Service: General;  Laterality: Left;    Family History  Problem Relation Age of Onset  . Asthma Sister   . Rheum arthritis Daughter     Social History   Social History  . Marital status: Widowed    Spouse name: N/A  . Number of children: N/A  . Years of education: N/A   Social History Main Topics  . Smoking status: Never Smoker  . Smokeless tobacco: Never  Used  . Alcohol use No  . Drug use: No  . Sexual activity: No   Other Topics Concern  . None   Social History Narrative  . None     PHYSICAL EXAMINATION  ECOG PERFORMANCE STATUS: 3 - Symptomatic, >50% confined to bed  Vitals:   09/23/16 1029  BP: (!) 89/52  Pulse: 86  Resp: 16    GENERAL:alert, no distress, comfortable, cooperative, smiling and accompanied by two daughters, one played games on her iPad, the other taking notes during discussion. SKIN: skin color, texture, turgor are normal, no rashes or significant lesions HEAD: Normocephalic EYES: normal, Conjunctiva are pink and non-injected EARS: External ears normal OROPHARYNX:mucous membranes are moist  NECK: supple, trachea midline LYMPH:  not examined BREAST:not examined LUNGS: clear to auscultation  HEART: regular rate & rhythm ABDOMEN:abdomen soft and normal bowel sounds BACK: Back symmetric, no curvature. Kyphotic EXTREMITIES:less then 2 second capillary refill, effusion, or inflammation, no skin discoloration, no cyanosis.  ulnar deviation, DIP contractures, swan neck deformities, and other classical joint findings associated with RA. NEURO: no focal motor/sensory deficits, in wheelchair    LABORATORY DATA: CBC    Component Value Date/Time   WBC 11.2 (H) 09/23/2016 0941   RBC 3.66 (L) 09/23/2016 0941   HGB 10.5 (L) 09/23/2016 0941   HCT 34.4 (L) 09/23/2016 0941   PLT 283 09/23/2016 0941   MCV 94.0 09/23/2016 0941   MCH 28.7 09/23/2016 0941   MCHC 30.5 09/23/2016 0941   RDW 13.9 09/23/2016 0941   LYMPHSABS 2.1 09/23/2016 0941   MONOABS 0.7 09/23/2016 0941   EOSABS 0.4 09/23/2016 0941   BASOSABS 0.1 09/23/2016 0941      Chemistry      Component Value Date/Time   NA 138 09/23/2016 0941   K 4.3 09/23/2016 0941   CL 98 (L) 09/23/2016 0941   CO2 33 (H) 09/23/2016 0941   BUN 16 09/23/2016 0941   CREATININE 0.73 09/23/2016 0941      Component Value Date/Time   CALCIUM 9.3 09/23/2016 0941    ALKPHOS 56 09/23/2016 0941   AST 17 09/23/2016 0941   ALT 13 (L) 09/23/2016 0941   BILITOT 0.1 (L) 09/23/2016 0941        PENDING LABS:   RADIOGRAPHIC STUDIES:  No results found.   PATHOLOGY:    ASSESSMENT AND PLAN:  Breast cancer of upper-outer quadrant of left female breast ([place]) Stage IIA  Left breast cancer, ER-, PR 1%+, Her2 -.  S/P left breast mastectomy on 05/09/2015.  Due to inability to lay flat, she is not a candidate for radiation therapy.  She is a poor systemic chemotherapy candidate as well.  Currently on Tamoxifen 20 mg daily.  Oncology history is updated.   Labs today: CBC diff, CMET.  I personally reviewed and went over laboratory results with the patient.  The results are noted within this dictation.  Normocytic, normochromic anemia is stable at 10.5 g/dL.  The patient's family reports  that they saw [facility] who recommended discontinuation of PO ferrous sulfate and recommended IV iron.  Given her difficulty with ambulation and transfer, IV iron would not be a good option in the event of an anaphylaxis situation (even though risk is low).  It is reported that ferrous sulfate causes constipation.  I will escribe ferrex forte due to the lower incidence of constipation.  If not tolerated, the IV iron may be indicated.  Ferritin and iron/TIBC cannot be added to today's blood work.  We will try to get copy of labs from [place] hematology.  Labs in 4 months: CBC diff, CMET, iron/TIBC, ferritin.  Chart is reviewed and recent hospitalization in October 2017 for Sepsis from UTI is noted.    Return in 4 months for follow-up.    ORDERS PLACED FOR THIS ENCOUNTER: Orders Placed This Encounter  Procedures  . CBC with Differential  . Comprehensive metabolic panel  . Iron and TIBC  . Ferritin    MEDICATIONS PRESCRIBED THIS ENCOUNTER: Meds ordered this encounter  Medications  . Polysacchar Iron-FA-B12 (FERREX 150 FORTE) 150-1-25 MG-MG-MCG CAPS    Sig:  Take 1 capsule by mouth daily.    Dispense:  30 capsule    Refill:  5    Order Specific Question:   Supervising Provider    Answer:   Patrici Ranks [id number]    THERAPY PLAN:  Continue Tamoxifen daily.    NCCN guidelines recommends the following surveillance for invasive breast cancer (2.2017):  A. History and Physical exam 1-4 times per year as clinically appropriate for 5 years, then annually.  B. Periodic screening for changes in family history and referral to genetics counseling as indicated  C. Educate, monitor, and refer to lymphedema management.  D. Mammography every 12 months  E. Routine imaging of reconstructed breast is not indicated.  F. In the absence of clinical signs and symptoms suggestive of recurrent disease, there is no indication for laboratory or imaging studies for metastases screening.  G. Women on Tamoxifen: annual gynecologic assessment every 12 months if uterus is present.  H. Women on aromatase inhibitor or who experience ovarian failure secondary to treatment should have monitoring of bone health with a bone mineral density determination at baseline and periodically thereafter.  I. Assess and encourage adherence to adjuvant endocrine therapy.  J. Evidence suggests that active lifestyle, healthy diet, limited alcohol intake, and achieving and maintaining an ideal body weight (20-25 BMI) may lead to optimal breast cancer outcomes.   All questions were answered. The patient knows to call the clinic with any problems, questions or concerns. We can certainly see the patient much sooner if necessary.  Patient and plan discussed with Dr. Ancil Linsey and she is in agreement with the aforementioned.   This note is electronically signed by: Robynn Pane, PA-C 09/23/2016 11:13 AM

## 2016-09-23 NOTE — Patient Instructions (Addendum)
[place] at [facility] Discharge Instructions  RECOMMENDATIONS MADE BY THE CONSULTANT AND ANY TEST RESULTS WILL BE SENT TO YOUR REFERRING PHYSICIAN.  You were seen today by Kirby Crigler PA-C. Continue taking the tamoxifen. Rx given today for Ferrex forte. Return in 4 months for labs and follow up.    Thank you for choosing [place] at [facility] to provide your oncology and hematology care.  To afford each patient quality time with our provider, please arrive at least 15 minutes before your scheduled appointment time.   Beginning January 23rd 2017 lab work for the [facility] will be done in the  Main lab at [facility] on 1st floor. If you have a lab appointment with the [facility] please come in thru the  Main Entrance and check in at the main information desk  You need to re-schedule your appointment should you arrive 10 or more minutes late.  We strive to give you quality time with our providers, and arriving late affects you and other patients whose appointments are after yours.  Also, if you no show three or more times for appointments you may be dismissed from the clinic at the providers discretion.     Again, thank you for choosing [facility].  Our hope is that these requests will decrease the amount of time that you wait before being seen by our physicians.       _____________________________________________________________  Should you have questions after your visit to [facility], please contact our office at [phone] between the hours of 8:30 a.m. and 4:30 p.m.  Voicemails left after 4:30 p.m. will not be returned until the following business day.  For prescription refill requests, have your pharmacy contact our office.         Resources For Cancer Patients and their Caregivers ? American Cancer Society: Can assist with transportation, wigs, general needs, runs Look Good Feel Better.         ([phone] 570-8450 ? Cancer Care: Provides financial assistance, online support groups, medication/co-pay assistance.  1-800-813-HOPE (765)421-0731) ? [facility] Assists [place] cancer patients and their families through emotional , educational and financial support.  (616)781-2789 ? [place] DSS Where to apply for food stamps, Medicaid and utility assistance. 9847296191 ? [employer]: Transportation to medical appointments. 5102345754 ? Social Security Administration: May apply for disability if have a Stage IV cancer. 7188149098 279-522-1557 ? [employer], Disability and Transit Services: Assists with nutrition, care and transit needs. [place] Support Programs: @10RELATIVEDAYS @ > Cancer Support Group  2nd Tuesday of the month 1pm-2pm, Journey Room  > Creative Journey  3rd Tuesday of the month 1130am-1pm, Journey Room  > Look Good Feel Better  1st Wednesday of the month 10am-12 noon, Journey Room (Call [place] to register [phone])

## 2016-12-24 ENCOUNTER — Other Ambulatory Visit (HOSPITAL_COMMUNITY): Payer: Self-pay | Admitting: Oncology

## 2016-12-24 DIAGNOSIS — C50412 Malignant neoplasm of upper-outer quadrant of left female breast: Secondary | ICD-10-CM

## 2017-01-06 LAB — CULTURE, BLOOD (ROUTINE X 2)

## 2017-01-15 ENCOUNTER — Encounter (HOSPITAL_COMMUNITY): Payer: Self-pay | Admitting: *Deleted

## 2017-01-15 ENCOUNTER — Emergency Department (HOSPITAL_COMMUNITY): Payer: Medicare Other

## 2017-01-15 ENCOUNTER — Inpatient Hospital Stay (HOSPITAL_COMMUNITY)
Admission: EM | Admit: 2017-01-15 | Discharge: 2017-01-20 | DRG: 194 | Disposition: A | Discharge status: Home / Self Care | Payer: Medicare Other | Attending: Internal Medicine | Admitting: Internal Medicine

## 2017-01-15 DIAGNOSIS — Z7984 Long term (current) use of oral hypoglycemic drugs: Secondary | ICD-10-CM

## 2017-01-15 DIAGNOSIS — K219 Gastro-esophageal reflux disease without esophagitis: Secondary | ICD-10-CM | POA: Diagnosis present

## 2017-01-15 DIAGNOSIS — L8990 Pressure ulcer of unspecified site, unspecified stage: Secondary | ICD-10-CM | POA: Insufficient documentation

## 2017-01-15 DIAGNOSIS — I2510 Atherosclerotic heart disease of native coronary artery without angina pectoris: Secondary | ICD-10-CM | POA: Diagnosis present

## 2017-01-15 DIAGNOSIS — E860 Dehydration: Secondary | ICD-10-CM | POA: Diagnosis not present

## 2017-01-15 DIAGNOSIS — T361X5A Adverse effect of cephalosporins and other beta-lactam antibiotics, initial encounter: Secondary | ICD-10-CM | POA: Diagnosis present

## 2017-01-15 DIAGNOSIS — E119 Type 2 diabetes mellitus without complications: Secondary | ICD-10-CM | POA: Diagnosis present

## 2017-01-15 DIAGNOSIS — J181 Lobar pneumonia, unspecified organism: Secondary | ICD-10-CM

## 2017-01-15 DIAGNOSIS — M810 Age-related osteoporosis without current pathological fracture: Secondary | ICD-10-CM | POA: Diagnosis present

## 2017-01-15 DIAGNOSIS — Z7401 Bed confinement status: Secondary | ICD-10-CM

## 2017-01-15 DIAGNOSIS — Z888 Allergy status to other drugs, medicaments and biological substances status: Secondary | ICD-10-CM

## 2017-01-15 DIAGNOSIS — J189 Pneumonia, unspecified organism: Secondary | ICD-10-CM | POA: Diagnosis not present

## 2017-01-15 DIAGNOSIS — J45909 Unspecified asthma, uncomplicated: Secondary | ICD-10-CM | POA: Diagnosis present

## 2017-01-15 DIAGNOSIS — Z7951 Long term (current) use of inhaled steroids: Secondary | ICD-10-CM

## 2017-01-15 DIAGNOSIS — B379 Candidiasis, unspecified: Secondary | ICD-10-CM | POA: Diagnosis present

## 2017-01-15 DIAGNOSIS — L89892 Pressure ulcer of other site, stage 2: Secondary | ICD-10-CM | POA: Diagnosis present

## 2017-01-15 DIAGNOSIS — Z881 Allergy status to other antibiotic agents status: Secondary | ICD-10-CM

## 2017-01-15 DIAGNOSIS — Z79899 Other long term (current) drug therapy: Secondary | ICD-10-CM

## 2017-01-15 DIAGNOSIS — M40209 Unspecified kyphosis, site unspecified: Secondary | ICD-10-CM | POA: Diagnosis present

## 2017-01-15 DIAGNOSIS — Z886 Allergy status to analgesic agent status: Secondary | ICD-10-CM

## 2017-01-15 DIAGNOSIS — Z7982 Long term (current) use of aspirin: Secondary | ICD-10-CM

## 2017-01-15 DIAGNOSIS — Z853 Personal history of malignant neoplasm of breast: Secondary | ICD-10-CM

## 2017-01-15 DIAGNOSIS — E8770 Fluid overload, unspecified: Secondary | ICD-10-CM | POA: Diagnosis not present

## 2017-01-15 DIAGNOSIS — Z8711 Personal history of peptic ulcer disease: Secondary | ICD-10-CM

## 2017-01-15 DIAGNOSIS — R220 Localized swelling, mass and lump, head: Secondary | ICD-10-CM | POA: Diagnosis present

## 2017-01-15 DIAGNOSIS — I10 Essential (primary) hypertension: Secondary | ICD-10-CM | POA: Diagnosis present

## 2017-01-15 DIAGNOSIS — M069 Rheumatoid arthritis, unspecified: Secondary | ICD-10-CM | POA: Diagnosis present

## 2017-01-15 DIAGNOSIS — Z79810 Long term (current) use of selective estrogen receptor modulators (SERMs): Secondary | ICD-10-CM

## 2017-01-15 DIAGNOSIS — I471 Supraventricular tachycardia: Secondary | ICD-10-CM | POA: Diagnosis not present

## 2017-01-15 DIAGNOSIS — Z7952 Long term (current) use of systemic steroids: Secondary | ICD-10-CM

## 2017-01-15 DIAGNOSIS — Z9981 Dependence on supplemental oxygen: Secondary | ICD-10-CM

## 2017-01-15 HISTORY — DX: Dependence on supplemental oxygen: Z99.81

## 2017-01-15 HISTORY — DX: Thrombocytopenia, unspecified: D69.6

## 2017-01-15 HISTORY — DX: Hypoxemia: R09.02

## 2017-01-15 HISTORY — DX: Rheumatic mitral valve disease, unspecified: I05.9

## 2017-01-15 HISTORY — DX: Atherosclerotic heart disease of native coronary artery without angina pectoris: I25.10

## 2017-01-15 HISTORY — DX: Age-related osteoporosis without current pathological fracture: M81.0

## 2017-01-15 LAB — CBC WITH DIFFERENTIAL/PLATELET
Basophils Absolute: 0 K/uL (ref 0.0–0.1)
Basophils Relative: 0 %
Eosinophils Absolute: 0 K/uL (ref 0.0–0.7)
Eosinophils Relative: 0 %
HCT: 33.1 % — ABNORMAL LOW (ref 36.0–46.0)
Hemoglobin: 10.5 g/dL — ABNORMAL LOW (ref 12.0–15.0)
Lymphocytes Relative: 20 %
Lymphs Abs: 2.4 K/uL (ref 0.7–4.0)
MCH: 28.6 pg (ref 26.0–34.0)
MCHC: 31.7 g/dL (ref 30.0–36.0)
MCV: 90.2 fL (ref 78.0–100.0)
Monocytes Absolute: 0.9 K/uL (ref 0.1–1.0)
Monocytes Relative: 7 %
Neutro Abs: 8.8 K/uL — ABNORMAL HIGH (ref 1.7–7.7)
Neutrophils Relative %: 72 %
Platelets: 172 K/uL (ref 150–400)
RBC: 3.67 MIL/uL — ABNORMAL LOW (ref 3.87–5.11)
RDW: 14.1 % (ref 11.5–15.5)
WBC: 12.3 K/uL — ABNORMAL HIGH (ref 4.0–10.5)

## 2017-01-15 MED ORDER — SODIUM CHLORIDE 0.9 % IV SOLN
Freq: Once | INTRAVENOUS | Status: AC
  Administered 2017-01-15: 1000 mL via INTRAVENOUS

## 2017-01-15 MED ORDER — SODIUM CHLORIDE 0.9 % IV BOLUS (SEPSIS)
500.0000 mL | Freq: Once | INTRAVENOUS | Status: AC
  Administered 2017-01-15: 500 mL via INTRAVENOUS

## 2017-01-15 NOTE — ED Triage Notes (Signed)
Pt c/o generalized weakness, low grade fever, cough that is productive with white sputum production that started yesterday, was seen at her pcp office and sent to er for further evaluation, ? pneumonia vs uti rule out,

## 2017-01-15 NOTE — ED Notes (Signed)
Family states pt has a chronic cough but has been coughing up some sputum also urine has been milky at times.

## 2017-01-15 NOTE — ED Notes (Signed)
Lab states unable to obtain blood for testing.

## 2017-01-15 NOTE — ED Provider Notes (Signed)
[facility] DEPT Provider Note   CSN: [id number] Arrival date & time: 01/15/17  1858  By signing my name below, I, Ethelle Lyon Long, attest that this documentation has been prepared under the direction and in the presence of Ezequiel Essex, MD. Electronically Signed: Ethelle Lyon Long, Scribe. 01/15/2017. 11:38 PM.  History   Chief Complaint Chief Complaint  Patient presents with  . Fever   The history is provided by the patient, medical records and a relative. No language interpreter was used.   HPI Comments:  [name] is a 83 y.o. female with a PMHx of Asthma, DM, Arthritis, HTN, and CA, who presents to the Emergency Department complaining of generalized weakness onset two days ago. Pt's daughter reports she began feeling weak two days ago stating "I feel helpless with all over pain." She was seen at Dr. Kelby Fam office today to r/o the flu. They referred her to the ED for Abx, CXR, blood work, and IV fluids following a negative flu test with "concern for sepsis." No recent falls or injuries. Pt has associated symptoms of productive cough with whitish sputum, rhinorrhea, generalized myalgias, and fever (TMax 100.8). No h/o CAD or other significant coronary events. No sick contact with similar symptoms. Pt cannot ambulate without assistance of her daughters. Pt denies HA, CP, abdominal pain, vomiting, nausea, diarrhea, dysuria, hematuria, HA, neck pain, and any other complaints or pain at this time.  Cardiologist: Dr. Alroy Dust   Past Medical History:  Diagnosis Date  . Acid reflux   . Anemia   . Arthritis    ra  . Asthma   . Cancer [facility])    breast cancer  . Coronary artery disease   . Diabetes mellitus without complication ([place])   . Diverticulitis   . Gastric ulcer   . Hypertension   . Hypoxemia   . Kyphosis   . Kyphosis   . Mitral valve disorder   . Osteoporosis   . Oxygen dependent    2 liters at night  . Raynaud disease   . Thrombocytopenia [facility])     Patient  Active Problem List   Diagnosis Date Noted  . Lactic acidosis 07/19/2016  . Asthma, chronic 04/12/2016  . Sepsis ([place]) 03/08/2016  . CAP (community acquired pneumonia) 10/09/2015  . Pressure ulcer 10/09/2015  . Kyphosis   . UTI (urinary tract infection) 06/30/2015  . Breast cancer of upper-outer quadrant of left female breast ([place]) 05/09/2015  . Rheumatoid arthritis ([place]) 10/29/2012  . HTN (hypertension) 10/29/2012  . Diabetes type 2, controlled ([place]) 10/29/2012  . GERD (gastroesophageal reflux disease) 10/29/2012    Past Surgical History:  Procedure Laterality Date  . BREAST SURGERY    . HERNIA REPAIR    . MASTECTOMY Right 1978  . SIMPLE MASTECTOMY WITH AXILLARY SENTINEL NODE BIOPSY Left 05/09/2015   Procedure: LEFT SIMPLE MASTECTOMY;  Surgeon: Erroll Luna, MD;  Location: [facility];  Service: General;  Laterality: Left;    OB History    Gravida Para Term Preterm AB Living   4 4 4     4    SAB TAB Ectopic Multiple Live Births                   Home Medications    Prior to Admission medications   Medication Sig Start Date End Date Taking? Authorizing Provider  albuterol (PROVENTIL HFA) 108 (90 BASE) MCG/ACT inhaler Inhale 2 puffs into the lungs every 6 (six) hours as needed for shortness of breath.  Historical Provider, MD  albuterol (PROVENTIL) (2.5 MG/3ML) 0.083% nebulizer solution Take 2.5 mg by nebulization daily. With Ipratropium as needed for shortness of breath    Historical Provider, MD  aspirin EC 81 MG tablet Take 81 mg by mouth daily.     Historical Provider, MD  atorvastatin (LIPITOR) 20 MG tablet Take 20 mg by mouth at bedtime.    Historical Provider, MD  budesonide-formoterol (SYMBICORT) 160-4.5 MCG/ACT inhaler Inhale 2 puffs into the lungs 2 (two) times daily.    Historical Provider, MD  cefpodoxime (VANTIN) 200 MG tablet Take 1 tablet (200 mg total) by mouth every 12 (twelve) hours. 07/23/16   Thurnell Lose, MD  Chlorpheniramine-Acetaminophen (CORICIDIN  HBP COLD/FLU PO) Take 1 capsule by mouth every 6 (six) hours as needed (for cold symptoms).     Historical Provider, MD  Cholecalciferol (VITAMIN D3) 1000 UNITS CAPS Take 2,000 Units by mouth daily.     Historical Provider, MD  ciclopirox (PENLAC) 8 % solution APPLY TO FUNGAL TOENAILS EVERY DAY 02/11/16   Historical Provider, MD  ipratropium (ATROVENT) 0.02 % nebulizer solution Take 500 mcg by nebulization daily. With albuterol as needed for shortness of breath    Historical Provider, MD  lactobacillus acidophilus & bulgar (LACTINEX) chewable tablet CHEW 1 TABLET BY MOUTH EVERY DAY 09/17/15   Historical Provider, MD  linagliptin (TRADJENTA) 5 MG TABS tablet Take 5 mg by mouth every morning.    Historical Provider, MD  magnesium oxide (MAG-OX) 400 (241.3 Mg) MG tablet Take 0.5 tablets (200 mg total) by mouth daily. 04/15/16   Reyne Dumas, MD  metFORMIN (GLUCOPHAGE) 500 MG tablet Take 500 mg by mouth 2 (two) times daily with a meal.     Historical Provider, MD  metoprolol succinate (TOPROL XL) 25 MG 24 hr tablet Take 1 tablet (25 mg total) by mouth daily. Patient taking differently: Take 25 mg by mouth daily. *Take one-half tablet if BP levels are <90/50 and symptomatic* 10/12/15   Nita Sells, MD  Multiple Vitamins-Minerals (CENTRUM SILVER ADULT 50+ PO) Take 1 tablet by mouth every morning.     Historical Provider, MD  pantoprazole (PROTONIX) 40 MG tablet Take 40 mg by mouth daily.    Historical Provider, MD  Polysacchar Iron-FA-B12 (FERREX 150 FORTE) 150-1-25 MG-MG-MCG CAPS Take 1 capsule by mouth daily. 09/23/16   Baird Cancer, PA-C  Polyvinyl Alcohol-Povidone (CLEAR EYES ALL SEASONS) 5-6 MG/ML SOLN Apply 1 drop to eye every morning.     Historical Provider, MD  predniSONE (DELTASONE) 5 MG tablet Take 5 mg by mouth daily with breakfast.    Historical Provider, MD  raloxifene (EVISTA) 60 MG tablet Take 60 mg by mouth every morning.    Historical Provider, MD  tamoxifen (NOLVADEX) 20 MG tablet  TAKE 1 TABLET BY MOUTH EVERY DAY 12/24/16   Baird Cancer, PA-C  traMADol (ULTRAM) 50 MG tablet Take 50 mg by mouth every 6 (six) hours as needed for moderate pain.    Historical Provider, MD    Family History Family History  Problem Relation Age of Onset  . Asthma Sister   . Rheum arthritis Daughter     Social History Social History  Substance Use Topics  . Smoking status: Never Smoker  . Smokeless tobacco: Never Used  . Alcohol use No     Allergies   Aspirin and Megace [megestrol]   Review of Systems Review of Systems  All systems reviewed and are negative for acute change except as noted in  the HPI.    Physical Exam Updated Vital Signs BP 104/67   Pulse (!) 107   Temp 98.7 F (37.1 C) (Oral)   Resp 20   Ht 5\' 3"  (1.6 m)   Wt 106 lb (48.1 kg)   SpO2 92%   BMI 18.78 kg/m   Physical Exam  Constitutional: She is oriented to person, place, and time. She appears well-developed and well-nourished. No distress.  HENT:  Head: Normocephalic and atraumatic.  Mouth/Throat: No oropharyngeal exudate.  Dry mucus membranes.    Eyes: Conjunctivae and EOM are normal. Pupils are equal, round, and reactive to light.  Neck: Normal range of motion. Neck supple.  Cardiovascular: Normal rate, normal heart sounds and intact distal pulses.   No murmur heard. Tachycardia 110s  Pulmonary/Chest: Effort normal and breath sounds normal. No respiratory distress. She exhibits no tenderness.  Decreased breath sounds at bases  Abdominal: Soft. There is no tenderness. There is no rebound and no guarding.  Musculoskeletal: She exhibits no edema or tenderness.  Contractures of neck and bilateral hands consistent with RA.    Neurological: She is alert and oriented to person, place, and time. No cranial nerve deficit. She exhibits normal muscle tone. Coordination normal.  CN 2-12 intact.Equal grip strength. Globally weak but moving all extremities.  Skin: Skin is warm.  Psychiatric: She  has a normal mood and affect. Her behavior is normal.  Nursing note and vitals reviewed.    ED Treatments / Results  DIAGNOSTIC STUDIES:  Oxygen Saturation is 92% on RA, low by my interpretation.    COORDINATION OF CARE:  11:15 PM Discussed treatment plan with pt at bedside including UA, CXR, EKG, with blood work and pt agreed to plan.  Labs (all labs ordered are listed, but only abnormal results are displayed) Labs Reviewed  CBC WITH DIFFERENTIAL/PLATELET - Abnormal; Notable for the following:       Result Value   WBC 12.3 (*)    RBC 3.67 (*)    Hemoglobin 10.5 (*)    HCT 33.1 (*)    Neutro Abs 8.8 (*)    All other components within normal limits  COMPREHENSIVE METABOLIC PANEL - Abnormal; Notable for the following:    Chloride 100 (*)    Glucose, Bld 251 (*)    Creatinine, Ser 1.07 (*)    Calcium 8.2 (*)    Total Protein 5.3 (*)    Albumin 2.2 (*)    AST 11 (*)    ALT 11 (*)    GFR calc non Af Amer 45 (*)    GFR calc Af Amer 52 (*)    All other components within normal limits  CULTURE, BLOOD (ROUTINE X 2)  URINE CULTURE  CULTURE, BLOOD (ROUTINE X 2)  CULTURE, BLOOD (ROUTINE X 2)  TROPONIN I  LACTIC ACID, PLASMA  URINALYSIS, ROUTINE W REFLEX MICROSCOPIC  STREP PNEUMONIAE URINARY ANTIGEN  CBC WITH DIFFERENTIAL/PLATELET  BASIC METABOLIC PANEL    EKG  EKG Interpretation  Date/Time:  Thursday January 15 2017 23:30:34 EDT Ventricular Rate:  104 PR Interval:    QRS Duration: 90 QT Interval:  342 QTC Calculation: 450 R Axis:   -4 Text Interpretation:  Sinus tachycardia RSR' in V1 or V2, right VCD or RVH Left ventricular hypertrophy anterior J point elevation similar to Oct 2017 Confirmed by Wyvonnia Dusky  MD, [place] [phone]) on 01/16/2017 12:34:04 AM       Radiology Dg Chest 2 View  Result Date: 01/15/2017 CLINICAL DATA:  Initial valuation  for acute cough, low-grade fever. History of aspiration. EXAM: CHEST  2 VIEW COMPARISON:  Prior radiograph from 07/19/2016.  FINDINGS: Cardiac and mediastinal silhouettes are grossly stable, and within normal limits. Aortic atherosclerosis. Chronic elevation of the left hemidiaphragm. Associated left basilar opacity may reflect atelectasis and/ or infiltrate. Lungs are otherwise clear. No pulmonary edema. No pleural effusion. No pneumothorax. Age indeterminate the left posterior ninth, and tenth rib fracture. No other acute osseous abnormality. Diffuse osteopenia. Exaggeration of the normal thoracic kyphosis. IMPRESSION: 1. Chronic elevation of the left hemidiaphragm with associated left basilar opacity. Atelectasis is favored, although a superimposed infiltrate not entirely excluded. 2. No other active cardiopulmonary disease. 3. Age indeterminate left posterior ninth rib fracture. Correlation with physical exam recommended. 4. Aortic atherosclerosis. Electronically Signed   By: Jeannine Boga M.D.   On: 01/15/2017 20:15    Procedures Procedures (including critical care time)  Medications Ordered in ED Medications  sodium chloride 0.9 % bolus 500 mL (0 mLs Intravenous Stopped 01/16/17 0003)  0.9 %  sodium chloride infusion (1,000 mLs Intravenous New Bag/Given 01/15/17 2323)     Initial Impression / Assessment and Plan / ED Course  I have reviewed the triage vital signs and the nursing notes.  Pertinent labs & imaging results that were available during my care of the patient were reviewed by me and considered in my medical decision making (see chart for details).    Patient sent by PCP with concern for sepsis. She has had generalized weakness, poor appetite, cough, leukocytosis at the office.  Patient appears dry. IV fluids are started. Labs will be obtained including cultures and urinalysis and chest x-ray.  Leukocytosis noted. Creatinine is at baseline. Patient was mildly tachycardic with dry mucous membranes. Unable to give a urine sample on catheterization. Patient was incontinent. No urine was obtained  during catheterization attempts.  Chest x-ray does show possible infiltrate at left base.  Due to patient's age and deconditioning, will plan admission for IV fluid resuscitation and IV antibiotics. We'll treat for possible pneumonia with Rocephin and Zithromax. Still no urinalysis obtained. Admission discussed with Dr. Shanon Brow.  Final Clinical Impressions(s) / ED Diagnoses   Final diagnoses:  Community acquired pneumonia of left lower lobe of lung ([place])  Dehydration    New Prescriptions New Prescriptions   No medications on file   I personally performed the services described in this documentation, which was scribed in my presence. The recorded information has been reviewed and is accurate.     Ezequiel Essex, MD 01/16/17 0430

## 2017-01-16 ENCOUNTER — Encounter (HOSPITAL_COMMUNITY): Payer: Self-pay | Admitting: *Deleted

## 2017-01-16 DIAGNOSIS — L89892 Pressure ulcer of other site, stage 2: Secondary | ICD-10-CM | POA: Diagnosis present

## 2017-01-16 DIAGNOSIS — Z883 Allergy status to other anti-infective agents status: Secondary | ICD-10-CM | POA: Diagnosis not present

## 2017-01-16 DIAGNOSIS — B379 Candidiasis, unspecified: Secondary | ICD-10-CM | POA: Diagnosis present

## 2017-01-16 DIAGNOSIS — I10 Essential (primary) hypertension: Secondary | ICD-10-CM | POA: Diagnosis present

## 2017-01-16 DIAGNOSIS — Z7984 Long term (current) use of oral hypoglycemic drugs: Secondary | ICD-10-CM | POA: Diagnosis not present

## 2017-01-16 DIAGNOSIS — Z9981 Dependence on supplemental oxygen: Secondary | ICD-10-CM | POA: Diagnosis not present

## 2017-01-16 DIAGNOSIS — L8990 Pressure ulcer of unspecified site, unspecified stage: Secondary | ICD-10-CM | POA: Insufficient documentation

## 2017-01-16 DIAGNOSIS — J181 Lobar pneumonia, unspecified organism: Secondary | ICD-10-CM

## 2017-01-16 DIAGNOSIS — Z7982 Long term (current) use of aspirin: Secondary | ICD-10-CM | POA: Diagnosis not present

## 2017-01-16 DIAGNOSIS — J189 Pneumonia, unspecified organism: Secondary | ICD-10-CM | POA: Diagnosis present

## 2017-01-16 DIAGNOSIS — M05722 Rheumatoid arthritis with rheumatoid factor of left elbow without organ or systems involvement: Secondary | ICD-10-CM | POA: Diagnosis not present

## 2017-01-16 DIAGNOSIS — J45909 Unspecified asthma, uncomplicated: Secondary | ICD-10-CM | POA: Diagnosis present

## 2017-01-16 DIAGNOSIS — M069 Rheumatoid arthritis, unspecified: Secondary | ICD-10-CM | POA: Diagnosis present

## 2017-01-16 DIAGNOSIS — E1121 Type 2 diabetes mellitus with diabetic nephropathy: Secondary | ICD-10-CM | POA: Diagnosis not present

## 2017-01-16 DIAGNOSIS — T361X5A Adverse effect of cephalosporins and other beta-lactam antibiotics, initial encounter: Secondary | ICD-10-CM | POA: Diagnosis present

## 2017-01-16 DIAGNOSIS — M810 Age-related osteoporosis without current pathological fracture: Secondary | ICD-10-CM | POA: Diagnosis present

## 2017-01-16 DIAGNOSIS — E8770 Fluid overload, unspecified: Secondary | ICD-10-CM | POA: Diagnosis not present

## 2017-01-16 DIAGNOSIS — Z853 Personal history of malignant neoplasm of breast: Secondary | ICD-10-CM | POA: Diagnosis not present

## 2017-01-16 DIAGNOSIS — E119 Type 2 diabetes mellitus without complications: Secondary | ICD-10-CM | POA: Diagnosis present

## 2017-01-16 DIAGNOSIS — Z886 Allergy status to analgesic agent status: Secondary | ICD-10-CM | POA: Diagnosis not present

## 2017-01-16 DIAGNOSIS — Z79899 Other long term (current) drug therapy: Secondary | ICD-10-CM | POA: Diagnosis not present

## 2017-01-16 DIAGNOSIS — I2510 Atherosclerotic heart disease of native coronary artery without angina pectoris: Secondary | ICD-10-CM | POA: Diagnosis present

## 2017-01-16 DIAGNOSIS — R220 Localized swelling, mass and lump, head: Secondary | ICD-10-CM | POA: Diagnosis present

## 2017-01-16 DIAGNOSIS — Z888 Allergy status to other drugs, medicaments and biological substances status: Secondary | ICD-10-CM | POA: Diagnosis not present

## 2017-01-16 DIAGNOSIS — Z7951 Long term (current) use of inhaled steroids: Secondary | ICD-10-CM | POA: Diagnosis not present

## 2017-01-16 DIAGNOSIS — K219 Gastro-esophageal reflux disease without esophagitis: Secondary | ICD-10-CM | POA: Diagnosis present

## 2017-01-16 DIAGNOSIS — I471 Supraventricular tachycardia: Secondary | ICD-10-CM | POA: Diagnosis not present

## 2017-01-16 DIAGNOSIS — E860 Dehydration: Secondary | ICD-10-CM

## 2017-01-16 DIAGNOSIS — Z7952 Long term (current) use of systemic steroids: Secondary | ICD-10-CM | POA: Diagnosis not present

## 2017-01-16 DIAGNOSIS — M40209 Unspecified kyphosis, site unspecified: Secondary | ICD-10-CM | POA: Diagnosis present

## 2017-01-16 LAB — BASIC METABOLIC PANEL
Anion gap: 7 (ref 5–15)
BUN: 16 mg/dL (ref 6–20)
CALCIUM: 7.7 mg/dL — AB (ref 8.9–10.3)
CHLORIDE: 101 mmol/L (ref 101–111)
CO2: 26 mmol/L (ref 22–32)
Creatinine, Ser: 1.01 mg/dL — ABNORMAL HIGH (ref 0.44–1.00)
GFR calc non Af Amer: 48 mL/min — ABNORMAL LOW (ref >60)
GFR, EST AFRICAN AMERICAN: 56 mL/min — AB (ref >60)
Glucose, Bld: 300 mg/dL — ABNORMAL HIGH (ref 65–99)
Potassium: 4.1 mmol/L (ref 3.5–5.1)
Sodium: 134 mmol/L — ABNORMAL LOW (ref 135–145)

## 2017-01-16 LAB — COMPREHENSIVE METABOLIC PANEL
ALBUMIN: 2.2 g/dL — AB (ref 3.5–5.0)
ALK PHOS: 49 U/L (ref 38–126)
ALT: 11 U/L — ABNORMAL LOW (ref 14–54)
ANION GAP: 5 (ref 5–15)
AST: 11 U/L — AB (ref 15–41)
BUN: 18 mg/dL (ref 6–20)
CO2: 30 mmol/L (ref 22–32)
Calcium: 8.2 mg/dL — ABNORMAL LOW (ref 8.9–10.3)
Chloride: 100 mmol/L — ABNORMAL LOW (ref 101–111)
Creatinine, Ser: 1.07 mg/dL — ABNORMAL HIGH (ref 0.44–1.00)
GFR calc Af Amer: 52 mL/min — ABNORMAL LOW (ref >60)
GFR calc non Af Amer: 45 mL/min — ABNORMAL LOW (ref >60)
GLUCOSE: 251 mg/dL — AB (ref 65–99)
POTASSIUM: 4.1 mmol/L (ref 3.5–5.1)
SODIUM: 135 mmol/L (ref 135–145)
Total Bilirubin: 0.4 mg/dL (ref 0.3–1.2)
Total Protein: 5.3 g/dL — ABNORMAL LOW (ref 6.5–8.1)

## 2017-01-16 LAB — CBC WITH DIFFERENTIAL/PLATELET
BASOS PCT: 0 %
Basophils Absolute: 0 10*3/uL (ref 0.0–0.1)
EOS ABS: 0.1 10*3/uL (ref 0.0–0.7)
EOS PCT: 1 %
HEMATOCRIT: 30.7 % — AB (ref 36.0–46.0)
Hemoglobin: 9.4 g/dL — ABNORMAL LOW (ref 12.0–15.0)
Lymphocytes Relative: 18 %
Lymphs Abs: 1.8 10*3/uL (ref 0.7–4.0)
MCH: 28.2 pg (ref 26.0–34.0)
MCHC: 30.6 g/dL (ref 30.0–36.0)
MCV: 92.2 fL (ref 78.0–100.0)
MONO ABS: 0.7 10*3/uL (ref 0.1–1.0)
MONOS PCT: 7 %
NEUTROS ABS: 7.2 10*3/uL (ref 1.7–7.7)
Neutrophils Relative %: 74 %
Platelets: 207 10*3/uL (ref 150–400)
RBC: 3.33 MIL/uL — ABNORMAL LOW (ref 3.87–5.11)
RDW: 14.3 % (ref 11.5–15.5)
WBC: 9.7 10*3/uL (ref 4.0–10.5)

## 2017-01-16 LAB — TROPONIN I: Troponin I: <0.03 ng/mL (ref <0.03)

## 2017-01-16 LAB — LACTIC ACID, PLASMA: LACTIC ACID, VENOUS: 0.9 mmol/L (ref 0.5–1.9)

## 2017-01-16 LAB — GLUCOSE, CAPILLARY
GLUCOSE-CAPILLARY: 227 mg/dL — AB (ref 65–99)
GLUCOSE-CAPILLARY: 93 mg/dL (ref 65–99)

## 2017-01-16 LAB — STREP PNEUMONIAE URINARY ANTIGEN: Strep Pneumo Urinary Antigen: NEGATIVE

## 2017-01-16 MED ORDER — TRAMADOL HCL 50 MG PO TABS
50.0000 mg | ORAL_TABLET | Freq: Four times a day (QID) | ORAL | Status: DC | PRN
  Administered 2017-01-19: 50 mg via ORAL
  Filled 2017-01-16: qty 1

## 2017-01-16 MED ORDER — LEVOFLOXACIN IN D5W 750 MG/150ML IV SOLN
750.0000 mg | INTRAVENOUS | Status: DC
  Administered 2017-01-17 – 2017-01-19 (×2): 750 mg via INTRAVENOUS
  Filled 2017-01-16 (×2): qty 150

## 2017-01-16 MED ORDER — DEXTROSE 5 % IV SOLN
500.0000 mg | INTRAVENOUS | Status: DC
  Filled 2017-01-16 (×3): qty 500

## 2017-01-16 MED ORDER — DEXTROSE 5 % IV SOLN
500.0000 mg | Freq: Once | INTRAVENOUS | Status: AC
  Administered 2017-01-16: 500 mg via INTRAVENOUS
  Filled 2017-01-16: qty 500

## 2017-01-16 MED ORDER — METOPROLOL SUCCINATE ER 25 MG PO TB24
25.0000 mg | ORAL_TABLET | Freq: Every day | ORAL | Status: DC
  Administered 2017-01-16 – 2017-01-18 (×3): 25 mg via ORAL
  Filled 2017-01-16 (×3): qty 1

## 2017-01-16 MED ORDER — MAGNESIUM OXIDE 400 (241.3 MG) MG PO TABS
200.0000 mg | ORAL_TABLET | Freq: Every day | ORAL | Status: DC
  Administered 2017-01-16 – 2017-01-20 (×5): 200 mg via ORAL
  Filled 2017-01-16 (×5): qty 1

## 2017-01-16 MED ORDER — PANTOPRAZOLE SODIUM 40 MG PO TBEC
40.0000 mg | DELAYED_RELEASE_TABLET | Freq: Every day | ORAL | Status: DC
  Administered 2017-01-16 – 2017-01-20 (×5): 40 mg via ORAL
  Filled 2017-01-16 (×5): qty 1

## 2017-01-16 MED ORDER — INSULIN ASPART 100 UNIT/ML ~~LOC~~ SOLN
0.0000 [IU] | Freq: Three times a day (TID) | SUBCUTANEOUS | Status: DC
  Administered 2017-01-16 – 2017-01-18 (×2): 5 [IU] via SUBCUTANEOUS
  Administered 2017-01-18 (×2): 2 [IU] via SUBCUTANEOUS
  Administered 2017-01-19: 8 [IU] via SUBCUTANEOUS
  Administered 2017-01-19: 5 [IU] via SUBCUTANEOUS
  Administered 2017-01-19: 3 [IU] via SUBCUTANEOUS
  Administered 2017-01-20: 2 [IU] via SUBCUTANEOUS
  Administered 2017-01-20: 3 [IU] via SUBCUTANEOUS

## 2017-01-16 MED ORDER — ASPIRIN EC 81 MG PO TBEC
81.0000 mg | DELAYED_RELEASE_TABLET | Freq: Every day | ORAL | Status: DC
  Administered 2017-01-16 – 2017-01-20 (×5): 81 mg via ORAL
  Filled 2017-01-16 (×5): qty 1

## 2017-01-16 MED ORDER — DEXTROSE 5 % IV SOLN
1.0000 g | INTRAVENOUS | Status: DC
  Filled 2017-01-16 (×3): qty 10

## 2017-01-16 MED ORDER — NAPHAZOLINE-GLYCERIN 0.012-0.2 % OP SOLN
1.0000 [drp] | Freq: Every morning | OPHTHALMIC | Status: DC
Start: 2017-01-16 — End: 2017-01-20
  Administered 2017-01-18 – 2017-01-20 (×3): 1 [drp] via OPHTHALMIC
  Filled 2017-01-16: qty 15

## 2017-01-16 MED ORDER — PREDNISONE 20 MG PO TABS
20.0000 mg | ORAL_TABLET | Freq: Every day | ORAL | Status: DC
Start: 2017-01-17 — End: 2017-01-18
  Administered 2017-01-17 – 2017-01-18 (×2): 20 mg via ORAL
  Filled 2017-01-16 (×2): qty 1

## 2017-01-16 MED ORDER — DEXTROSE 5 % IV SOLN
1.0000 g | Freq: Once | INTRAVENOUS | Status: AC
  Administered 2017-01-16: 1 g via INTRAVENOUS
  Filled 2017-01-16: qty 10

## 2017-01-16 MED ORDER — POLYSACCHARIDE IRON COMPLEX 150 MG PO CAPS
150.0000 mg | ORAL_CAPSULE | Freq: Every day | ORAL | Status: DC
  Administered 2017-01-16 – 2017-01-20 (×5): 150 mg via ORAL
  Filled 2017-01-16 (×5): qty 1

## 2017-01-16 MED ORDER — CEFPODOXIME PROXETIL 200 MG PO TABS: 200.0000 mg | ORAL_TABLET | Freq: Two times a day (BID) | ORAL | Status: DC

## 2017-01-16 MED ORDER — MENTHOL 3 MG MT LOZG
1.0000 | LOZENGE | OROMUCOSAL | Status: DC | PRN
  Administered 2017-01-17: 3 mg via ORAL
  Filled 2017-01-16 (×2): qty 9

## 2017-01-16 MED ORDER — LEVOFLOXACIN IN D5W 750 MG/150ML IV SOLN: 750.0000 mg | INTRAVENOUS | Status: DC

## 2017-01-16 MED ORDER — SODIUM CHLORIDE 0.9 % IV BOLUS (SEPSIS)
500.0000 mL | Freq: Once | INTRAVENOUS | Status: AC
  Administered 2017-01-16: 500 mL via INTRAVENOUS

## 2017-01-16 MED ORDER — IPRATROPIUM BROMIDE 0.02 % IN SOLN
500.0000 ug | Freq: Every day | RESPIRATORY_TRACT | Status: DC
Start: 2017-01-16 — End: 2017-01-19
  Administered 2017-01-16 – 2017-01-19 (×4): 500 ug via RESPIRATORY_TRACT
  Filled 2017-01-16 (×4): qty 2.5

## 2017-01-16 MED ORDER — ALBUTEROL SULFATE (2.5 MG/3ML) 0.083% IN NEBU
2.5000 mg | INHALATION_SOLUTION | Freq: Every day | RESPIRATORY_TRACT | Status: DC
Start: 2017-01-16 — End: 2017-01-17
  Administered 2017-01-16: 2.5 mg via RESPIRATORY_TRACT
  Filled 2017-01-16: qty 3

## 2017-01-16 MED ORDER — SODIUM CHLORIDE 0.9 % IV SOLN: INTRAVENOUS | Status: DC

## 2017-01-16 MED ORDER — METFORMIN HCL 500 MG PO TABS
500.0000 mg | ORAL_TABLET | Freq: Two times a day (BID) | ORAL | Status: DC
  Administered 2017-01-16 (×2): 500 mg via ORAL
  Filled 2017-01-16 (×2): qty 1

## 2017-01-16 MED ORDER — VITAMIN D 1000 UNITS PO TABS
2000.0000 [IU] | ORAL_TABLET | Freq: Every day | ORAL | Status: DC
  Administered 2017-01-16 – 2017-01-20 (×5): 2000 [IU] via ORAL
  Filled 2017-01-16 (×5): qty 2

## 2017-01-16 MED ORDER — SODIUM CHLORIDE 0.9 % IV SOLN
INTRAVENOUS | Status: DC
  Administered 2017-01-16 – 2017-01-17 (×5): via INTRAVENOUS

## 2017-01-16 MED ORDER — ATORVASTATIN CALCIUM 20 MG PO TABS
20.0000 mg | ORAL_TABLET | Freq: Every day | ORAL | Status: DC
  Administered 2017-01-16 – 2017-01-19 (×4): 20 mg via ORAL
  Filled 2017-01-16 (×4): qty 1

## 2017-01-16 MED ORDER — PREDNISONE 10 MG PO TABS
5.0000 mg | ORAL_TABLET | Freq: Every day | ORAL | Status: DC
  Administered 2017-01-16: 5 mg via ORAL
  Filled 2017-01-16: qty 1

## 2017-01-16 MED ORDER — RALOXIFENE HCL 60 MG PO TABS
60.0000 mg | ORAL_TABLET | Freq: Every morning | ORAL | Status: DC
  Administered 2017-01-16 – 2017-01-20 (×5): 60 mg via ORAL
  Filled 2017-01-16 (×5): qty 1

## 2017-01-16 MED ORDER — INSULIN ASPART 100 UNIT/ML ~~LOC~~ SOLN
0.0000 [IU] | Freq: Every day | SUBCUTANEOUS | Status: DC
  Administered 2017-01-17: 4 [IU] via SUBCUTANEOUS
  Administered 2017-01-19: 5 [IU] via SUBCUTANEOUS

## 2017-01-16 MED ORDER — ALBUTEROL SULFATE (2.5 MG/3ML) 0.083% IN NEBU: 3.0000 mL | INHALATION_SOLUTION | Freq: Four times a day (QID) | RESPIRATORY_TRACT | Status: DC | PRN

## 2017-01-16 MED ORDER — TAMOXIFEN CITRATE 10 MG PO TABS
20.0000 mg | ORAL_TABLET | Freq: Every day | ORAL | Status: DC
  Administered 2017-01-16 – 2017-01-20 (×5): 20 mg via ORAL
  Filled 2017-01-16 (×5): qty 2

## 2017-01-16 MED ORDER — LINAGLIPTIN 5 MG PO TABS
5.0000 mg | ORAL_TABLET | Freq: Every morning | ORAL | Status: DC
  Administered 2017-01-16 – 2017-01-20 (×5): 5 mg via ORAL
  Filled 2017-01-16 (×5): qty 1

## 2017-01-16 NOTE — Progress Notes (Signed)
Follow up note from earlier admission today: 83 yo with kyphosis, CAD, lives at home with one of her daughters, bedbound, with hx of RA on chronic steroid, kyphosis, DM2, asthma, admitted for CAP.  She has yellow bloody sputum, and was started on IV Rocephin and Zithromax.  She has mild lip swelling, but no wheezing. She is alert, and is a full code. Will change IV Rocephin (probably the culprit) and Zithromax to Levoquin IV. Continue with current Tx. Adjust insulin as per Diabetes Coordinator's recommendation.  Orvan Falconer MD FACP. Hospitalist.

## 2017-01-16 NOTE — Care Management Note (Signed)
Case Management Note  Patient Details  Name: [name] MRN: [id number] Date of Birth: 1926-09-01  Subjective/Objective:                  Pt admitted with CAP. Pt's daughters and POA's at bedside. She is bedbound and total care. She has 24/7 caregivers pta. She has PCP, transportation and insurance with drug coverage. Family has all necessary DME to care for her.   Action/Plan: Family plans to return home with resumption of previous care arrangements.   Expected Discharge Date:  01/19/17               Expected Discharge Plan:  Home/Self Care  In-House Referral:  NA  Discharge planning Services  CM Consult  Post Acute Care Choice:  NA Choice offered to:  NA  Status of Service:  Completed, signed off  Sherald Barge, RN 01/16/2017, 9:35 AM

## 2017-01-16 NOTE — ED Notes (Signed)
Noted pt had urinated before attempting to insert foley. Pt cleaned & dry chucks placed under pt.

## 2017-01-16 NOTE — Progress Notes (Signed)
Inpatient Diabetes Program Recommendations  AACE/ADA: New Consensus Statement on Inpatient Glycemic Control (2015)  Target Ranges:  Prepandial:   less than 140 mg/dL      Peak postprandial:   less than 180 mg/dL (1-2 hours)      Critically ill patients:  140 - 180 mg/dL   Results for MADELYNE, MILLIKAN (MRN [id number]) as of 01/16/2017 08:44  Ref. Range 01/15/2017 22:49 01/16/2017 04:46  Glucose Latest Ref Range: 65 - 99 mg/dL 251 (H) 300 (H)   Review of Glycemic Control  Diabetes history: DM2 Outpatient Diabetes medications: Tradjenta 5 QAM, Metformin 500 mg BID Current orders for Inpatient glycemic control: Tradjenta 5 QAM, Metformin 500 mg BID  Inpatient Diabetes Program Recommendations: Correction (SSI): While inpatient, please consider ordering CBGs with Novolog correction scale ACHS.  Thanks, Barnie Alderman, RN, MSN, CDE Diabetes Coordinator Inpatient Diabetes Program [phone] (Team Pager from 8am to 5pm)

## 2017-01-16 NOTE — ED Notes (Signed)
Pt had voided in depends. No urine obtained from in & out cath.

## 2017-01-16 NOTE — Care Management Obs Status (Signed)
[facility] NOTIFICATION   Patient Details  Name: [name] MRN: [id number] Date of Birth: Aug 19, 1926   Medicare Observation Status Notification Given:  Yes    Sherald Barge, RN 01/16/2017, 9:35 AM

## 2017-01-16 NOTE — Progress Notes (Signed)
PHARMACY NOTE:  ANTIMICROBIAL RENAL DOSAGE ADJUSTMENT  Current antimicrobial regimen includes a mismatch between antimicrobial dosage and estimated renal function.  As per policy approved by the Pharmacy & Therapeutics and Medical Executive Committees, the antimicrobial dosage will be adjusted accordingly.  Current antimicrobial dosage:  Levaquin 750 mg IV every 24 hours  Indication: Pneumonia  Renal Function:  Estimated Creatinine Clearance: 28 mL/min (A) (by C-G formula based on SCr of 1.01 mg/dL (H)). []      On intermittent HD, scheduled: []      On CRRT    Antimicrobial dosage has been changed to:  Levaquin 750 mg IV every 48 hours  Additional comments:   Thank you for allowing pharmacy to be a part of this patient's care.  Gildardo Griffes [place], [facility] 01/16/2017 12:27 PM

## 2017-01-16 NOTE — H&P (Signed)
History and Physical    [name] VQQ:[id number] DOB: 06/20/1926 DOA: 01/15/2017  PCP: Moshe Cipro, MD  Patient coming from:  home Chief Complaint:   Fever, cough  HPI: [name] is a 83 y.o. female with medical history significant of kyphosis, CAD, PUD, bedbound status comes in from PCP office for fever and cough.  No n/v/d.  No swelling in legs.  Pt lives with her husband and daughter, she is bedbound due to her kyphosis.  No pain anywhere.  Pt found to have possible pna and referred for admission for such.    Review of Systems: As per HPI otherwise 10 point review of systems negative.   Past Medical History:  Diagnosis Date  . Acid reflux   . Anemia   . Arthritis    ra  . Asthma   . Cancer [facility])    breast cancer  . Coronary artery disease   . Diabetes mellitus without complication ([place])   . Diverticulitis   . Gastric ulcer   . Hypertension   . Hypoxemia   . Kyphosis   . Kyphosis   . Mitral valve disorder   . Osteoporosis   . Oxygen dependent    2 liters at night  . Raynaud disease   . Thrombocytopenia ([place])     Past Surgical History:  Procedure Laterality Date  . BREAST SURGERY    . HERNIA REPAIR    . MASTECTOMY Right 1978  . SIMPLE MASTECTOMY WITH AXILLARY SENTINEL NODE BIOPSY Left 05/09/2015   Procedure: LEFT SIMPLE MASTECTOMY;  Surgeon: Erroll Luna, MD;  Location: [facility];  Service: General;  Laterality: Left;     reports that she has never smoked. She has never used smokeless tobacco. She reports that she does not drink alcohol or use drugs.  Allergies  Allergen Reactions  . Aspirin Other (See Comments)    Cannot take uncoated due to stomach ulcers  . Megace [Megestrol]     abd pain     Family History  Problem Relation Age of Onset  . Asthma Sister   . Rheum arthritis Daughter     Prior to Admission medications   Medication Sig Start Date End Date Taking? Authorizing Provider  albuterol (PROVENTIL HFA) 108 (90 BASE) MCG/ACT inhaler  Inhale 2 puffs into the lungs every 6 (six) hours as needed for shortness of breath.     Historical Provider, MD  albuterol (PROVENTIL) (2.5 MG/3ML) 0.083% nebulizer solution Take 2.5 mg by nebulization daily. With Ipratropium as needed for shortness of breath    Historical Provider, MD  aspirin EC 81 MG tablet Take 81 mg by mouth daily.     Historical Provider, MD  atorvastatin (LIPITOR) 20 MG tablet Take 20 mg by mouth at bedtime.    Historical Provider, MD  budesonide-formoterol (SYMBICORT) 160-4.5 MCG/ACT inhaler Inhale 2 puffs into the lungs 2 (two) times daily.    Historical Provider, MD  cefpodoxime (VANTIN) 200 MG tablet Take 1 tablet (200 mg total) by mouth every 12 (twelve) hours. 07/23/16   Thurnell Lose, MD  Chlorpheniramine-Acetaminophen (CORICIDIN HBP COLD/FLU PO) Take 1 capsule by mouth every 6 (six) hours as needed (for cold symptoms).     Historical Provider, MD  Cholecalciferol (VITAMIN D3) 1000 UNITS CAPS Take 2,000 Units by mouth daily.     Historical Provider, MD  ciclopirox (PENLAC) 8 % solution APPLY TO FUNGAL TOENAILS EVERY DAY 02/11/16   Historical Provider, MD  ipratropium (ATROVENT) 0.02 % nebulizer solution Take 500 mcg by  nebulization daily. With albuterol as needed for shortness of breath    Historical Provider, MD  lactobacillus acidophilus & bulgar (LACTINEX) chewable tablet CHEW 1 TABLET BY MOUTH EVERY DAY 09/17/15   Historical Provider, MD  linagliptin (TRADJENTA) 5 MG TABS tablet Take 5 mg by mouth every morning.    Historical Provider, MD  magnesium oxide (MAG-OX) 400 (241.3 Mg) MG tablet Take 0.5 tablets (200 mg total) by mouth daily. 04/15/16   Reyne Dumas, MD  metFORMIN (GLUCOPHAGE) 500 MG tablet Take 500 mg by mouth 2 (two) times daily with a meal.     Historical Provider, MD  metoprolol succinate (TOPROL XL) 25 MG 24 hr tablet Take 1 tablet (25 mg total) by mouth daily. Patient taking differently: Take 25 mg by mouth daily. *Take one-half tablet if BP levels  are <90/50 and symptomatic* 10/12/15   Nita Sells, MD  Multiple Vitamins-Minerals (CENTRUM SILVER ADULT 50+ PO) Take 1 tablet by mouth every morning.     Historical Provider, MD  pantoprazole (PROTONIX) 40 MG tablet Take 40 mg by mouth daily.    Historical Provider, MD  Polysacchar Iron-FA-B12 (FERREX 150 FORTE) 150-1-25 MG-MG-MCG CAPS Take 1 capsule by mouth daily. 09/23/16   Baird Cancer, PA-C  Polyvinyl Alcohol-Povidone (CLEAR EYES ALL SEASONS) 5-6 MG/ML SOLN Apply 1 drop to eye every morning.     Historical Provider, MD  predniSONE (DELTASONE) 5 MG tablet Take 5 mg by mouth daily with breakfast.    Historical Provider, MD  raloxifene (EVISTA) 60 MG tablet Take 60 mg by mouth every morning.    Historical Provider, MD  tamoxifen (NOLVADEX) 20 MG tablet TAKE 1 TABLET BY MOUTH EVERY DAY 12/24/16   Baird Cancer, PA-C  traMADol (ULTRAM) 50 MG tablet Take 50 mg by mouth every 6 (six) hours as needed for moderate pain.    Historical Provider, MD    Physical Exam: Vitals:   01/16/17 0000 01/16/17 0026 01/16/17 0200 01/16/17 0300  BP: 120/70  123/63 (!) 113/59  Pulse: (!) 103  95 94  Resp: (!) 24  (!) 21 18  Temp:  99.2 F (37.3 C)    TempSrc:  Rectal    SpO2: 96%  92% 93%  Weight:      Height:        Constitutional: NAD, calm, comfortable Vitals:   01/16/17 0000 01/16/17 0026 01/16/17 0200 01/16/17 0300  BP: 120/70  123/63 (!) 113/59  Pulse: (!) 103  95 94  Resp: (!) 24  (!) 21 18  Temp:  99.2 F (37.3 C)    TempSrc:  Rectal    SpO2: 96%  92% 93%  Weight:      Height:       Eyes: PERRL, lids and conjunctivae normal ENMT: Mucous membranes are moist. Posterior pharynx clear of any exudate or lesions.Normal dentition.  Neck: normal, supple, no masses, no thyromegaly Respiratory: clear to auscultation bilaterally, no wheezing, no crackles. Normal respiratory effort. No accessory muscle use.  Cardiovascular: Regular rate and rhythm, no murmurs / rubs / gallops. No  extremity edema. 2+ pedal pulses. No carotid bruits.  Abdomen: no tenderness, no masses palpated. No hepatosplenomegaly. Bowel sounds positive.  Musculoskeletal: no clubbing / cyanosis. No joint deformity upper and lower extremities. Good ROM, no contractures. Normal muscle tone.   Severe kyphosis Skin: no rashes, lesions, ulcers. No induration Neurologic: CN 2-12 grossly intact. Sensation intact, DTR normal. Strength 5/5 in all 4.  Psychiatric: Normal judgment and insight. Alert and oriented  x 3. Normal mood.    Labs on Admission: I have personally reviewed following labs and imaging studies  CBC:  Recent Labs Lab 01/15/17 2249  WBC 12.3*  NEUTROABS 8.8*  HGB 10.5*  HCT 33.1*  MCV 90.2  PLT 638   Basic Metabolic Panel:  Recent Labs Lab 01/15/17 2249  NA 135  K 4.1  CL 100*  CO2 30  GLUCOSE 251*  BUN 18  CREATININE 1.07*  CALCIUM 8.2*   GFR: Estimated Creatinine Clearance: 27.6 mL/min (A) (by C-G formula based on SCr of 1.07 mg/dL (H)). Liver Function Tests:  Recent Labs Lab 01/15/17 2249  AST 11*  ALT 11*  ALKPHOS 49  BILITOT 0.4  PROT 5.3*  ALBUMIN 2.2*   Cardiac Enzymes:  Recent Labs Lab 01/15/17 2347  TROPONINI <0.02    Recent Results (from the past 240 hour(s))  Blood culture (routine x 2)     Status: None (Preliminary result)   Collection Time: 01/15/17 11:47 PM  Result Value Ref Range Status   Specimen Description BLOOD BLOOD RIGHT WRIST  Final   Special Requests   Final    BOTTLES DRAWN AEROBIC AND ANAEROBIC Blood Culture results may not be optimal due to an inadequate volume of blood received in culture bottles   Culture PENDING  Incomplete   Report Status PENDING  Incomplete     Radiological Exams on Admission: Dg Chest 2 View  Result Date: 01/15/2017 CLINICAL DATA:  Initial valuation for acute cough, low-grade fever. History of aspiration. EXAM: CHEST  2 VIEW COMPARISON:  Prior radiograph from 07/19/2016. FINDINGS: Cardiac and  mediastinal silhouettes are grossly stable, and within normal limits. Aortic atherosclerosis. Chronic elevation of the left hemidiaphragm. Associated left basilar opacity may reflect atelectasis and/ or infiltrate. Lungs are otherwise clear. No pulmonary edema. No pleural effusion. No pneumothorax. Age indeterminate the left posterior ninth, and tenth rib fracture. No other acute osseous abnormality. Diffuse osteopenia. Exaggeration of the normal thoracic kyphosis. IMPRESSION: 1. Chronic elevation of the left hemidiaphragm with associated left basilar opacity. Atelectasis is favored, although a superimposed infiltrate not entirely excluded. 2. No other active cardiopulmonary disease. 3. Age indeterminate left posterior ninth rib fracture. Correlation with physical exam recommended. 4. Aortic atherosclerosis. Electronically Signed   By: Jeannine Boga M.D.   On: 01/15/2017 20:15    Assessment/Plan 83 yo female with over one day of fever, cough with likely CAP  Principal Problem:   CAP (community acquired pneumonia)- pna pathway.  Place on rocephin and azithro.  Nontoxic.  Oxygen sats normal.  Active Problems:   Rheumatoid arthritis ([place])- noted, cont home prednisone dosing   HTN (hypertension)- noted, stable   Diabetes type 2, controlled ([place])- stable   Kyphosis- noted, contributing to pna , uses IS at home regularly   Asthma, chronic- cont nebs   Mild dehydration - ivf for the next day or so    DVT prophylaxis:  scds Code Status:  full Family Communication:  daughters  Disposition Plan:  Per day team Consults called:  none Admission status:  observation   Pharrah Rottman A MD [facility]  If 7PM-7AM, please contact night-coverage [email or web address] Password [facility]  01/16/2017, 4:17 AM

## 2017-01-17 LAB — URINE CULTURE: Culture: >=100000 — AB

## 2017-01-17 LAB — GLUCOSE, CAPILLARY
GLUCOSE-CAPILLARY: 191 mg/dL — AB (ref 65–99)
GLUCOSE-CAPILLARY: 325 mg/dL — AB (ref 65–99)
Glucose-Capillary: 128 mg/dL — ABNORMAL HIGH (ref 65–99)

## 2017-01-17 LAB — LACTIC ACID, PLASMA: Lactic Acid, Venous: 2.7 mmol/L (ref 0.5–1.9)

## 2017-01-17 MED ORDER — LEVALBUTEROL HCL 0.63 MG/3ML IN NEBU
0.6300 mg | INHALATION_SOLUTION | Freq: Four times a day (QID) | RESPIRATORY_TRACT | Status: DC | PRN
  Administered 2017-01-19: 0.63 mg via RESPIRATORY_TRACT
  Filled 2017-01-17: qty 3

## 2017-01-17 MED ORDER — ACETAMINOPHEN 325 MG PO TABS
650.0000 mg | ORAL_TABLET | Freq: Four times a day (QID) | ORAL | Status: DC | PRN
  Administered 2017-01-17: 650 mg via ORAL
  Filled 2017-01-17: qty 2

## 2017-01-17 MED ORDER — SODIUM CHLORIDE 0.9 % IV BOLUS (SEPSIS)
500.0000 mL | Freq: Once | INTRAVENOUS | Status: AC
  Administered 2017-01-17: 500 mL via INTRAVENOUS

## 2017-01-17 MED ORDER — ADENOSINE 6 MG/2ML IV SOLN
INTRAVENOUS | Status: AC
  Filled 2017-01-17: qty 2

## 2017-01-17 MED ORDER — METOPROLOL TARTRATE 25 MG PO TABS
12.5000 mg | ORAL_TABLET | Freq: Once | ORAL | Status: AC
  Administered 2017-01-17: 12.5 mg via ORAL
  Filled 2017-01-17: qty 1

## 2017-01-17 NOTE — Progress Notes (Signed)
Called to bedside by dr Hal Hope who is taking floor call tonight to give pt adenosine due to SVT with rate in the 170s.  Pt with temp of102, given tylenol.  Tried valsalva but pt really cannot do this.  Tried carotid massage.  By the time got crash cart ready and adenosine ready pt rate came down to 116 range.  bp stable.  Pt stable.  Given fluid bolus earlier.  Will change her albuterol nebs to xoponex, and give extra small dose of her metoprolol tonight.  Telemetry monitor placed for cardiac monitoring for the rest of the night.    No charge.

## 2017-01-17 NOTE — Progress Notes (Signed)
Notified NP pt has fever of 102.2 and pulse was elevated. See new orders

## 2017-01-17 NOTE — Progress Notes (Signed)
PROGRESS NOTE    Laketta Soderberg  KDX:[id number] DOB: 03-01-1926 DOA: 01/15/2017 PCP: Moshe Cipro, MD    Brief Narrative: 83 yo with kyphosis, CAD, lives at home with one of her daughters, bedbound, with hx of RA on chronic steroid, kyphosis, DM2, asthma, admitted for CAP.  She has yellow bloody sputum, and was started on IV Rocephin and Zithromax.  She had mild lip swelling yesterday, so I suspect she has allergy to cephalosporin.  Her Rocephin/ZIthromax was changed to IV Levaquin, renally adjusted.  She is much better today.  She developed PSVT yesterday, and was given extra dose of BB, and Duoneb changed to Zopenex.   Assessment & Plan:   Principal Problem:   CAP (community acquired pneumonia) Active Problems:   Rheumatoid arthritis ([place])   HTN (hypertension)   Diabetes type 2, controlled ([place])   Kyphosis   Asthma, chronic   Dehydration   Pressure injury of skin   CAP:  Will continue with IV Levaquin.  Will d/c home when stable.   PSVT:  Conitnue with BB.  Will continue with monitoring. Decrease IVF to 50cc per hour.   Steroid dependent for RA:  Have increase to 20mg  per day.   DVT prophylaxis: SUBQ heparin.  Code Status: FULL CODE.  Family Communication: 2 different daughters updated today.  Disposition Plan: Home.   Consultants:   Home.   Procedures:   None.  Antimicrobials: Anti-infectives    Start     Dose/Rate Route Frequency Ordered Stop   01/17/17 0400  azithromycin (ZITHROMAX) 500 mg in dextrose 5 % 250 mL IVPB  Status:  Discontinued     500 mg 250 mL/hr over 60 Minutes Intravenous Every 24 hours 01/16/17 0420 01/16/17 1212   01/17/17 0300  cefTRIAXone (ROCEPHIN) 1 g in dextrose 5 % 50 mL IVPB  Status:  Discontinued     1 g 100 mL/hr over 30 Minutes Intravenous Every 24 hours 01/16/17 0420 01/16/17 1212   01/17/17 0200  levofloxacin (LEVAQUIN) IVPB 750 mg     750 mg 100 mL/hr over 90 Minutes Intravenous Every 48 hours 01/16/17 1226     01/16/17 1300   levofloxacin (LEVAQUIN) IVPB 750 mg  Status:  Discontinued     750 mg 100 mL/hr over 90 Minutes Intravenous Every 48 hours 01/16/17 1212 01/16/17 1226   01/16/17 1000  cefpodoxime (VANTIN) tablet 200 mg  Status:  Discontinued     200 mg Oral Every 12 hours 01/16/17 0420 01/16/17 0517   01/16/17 0230  cefTRIAXone (ROCEPHIN) 1 g in dextrose 5 % 50 mL IVPB     1 g 100 mL/hr over 30 Minutes Intravenous  Once 01/16/17 0221 01/16/17 0319   01/16/17 0230  azithromycin (ZITHROMAX) 500 mg in dextrose 5 % 250 mL IVPB     500 mg 250 mL/hr over 60 Minutes Intravenous  Once 01/16/17 0221 01/16/17 0508       Subjective:  Doing better.   Objective: Vitals:   01/17/17 0630 01/17/17 0632 01/17/17 0747 01/17/17 0805  BP: (!) 104/53  92/73   Pulse: 99  (!) 139   Resp: 20  18   Temp: (!) 100.7 F (38.2 C) 98.5 F (36.9 C) 99 F (37.2 C)   TempSrc: Oral Axillary Oral   SpO2: 100%  99% 94%  Weight:      Height:        Intake/Output Summary (Last 24 hours) at 01/17/17 1307 Last data filed at 01/17/17 0500  Gross per 24 hour  Intake          2778.33 ml  Output                2 ml  Net          2776.33 ml   Filed Weights   01/15/17 1924 01/16/17 0421  Weight: 48.1 kg (106 lb) 46.1 kg (101 lb 10.1 oz)    Examination:  General exam: Appears calm and comfortable  Respiratory system: Clear to auscultation. Respiratory effort normal. Cardiovascular system: S1 & S2 heard, RRR. No JVD, murmurs, rubs, gallops or clicks. No pedal edema. Gastrointestinal system: Abdomen is nondistended, soft and nontender. No organomegaly or masses felt. Normal bowel sounds heard. Central nervous system: Alert and oriented. No focal neurological deficits. Extremities: Symmetric 5 x 5 power. Skin: No rashes, lesions or ulcers Psychiatry: Judgement and insight appear normal. Mood & affect appropriate.   Data Reviewed: I have personally reviewed following labs and imaging studies  CBC:  Recent Labs Lab  01/15/17 2249 01/16/17 0446  WBC 12.3* 9.7  NEUTROABS 8.8* 7.2  HGB 10.5* 9.4*  HCT 33.1* 30.7*  MCV 90.2 92.2  PLT 172 462   Basic Metabolic Panel:  Recent Labs Lab 01/15/17 2249 01/16/17 0446  NA 135 134*  K 4.1 4.1  CL 100* 101  CO2 30 26  GLUCOSE 251* 300*  BUN 18 16  CREATININE 1.07* 1.01*  CALCIUM 8.2* 7.7*   Liver Function Tests:  Recent Labs Lab 01/15/17 2249  AST 11*  ALT 11*  ALKPHOS 49  BILITOT 0.4  PROT 5.3*  ALBUMIN 2.2*   Cardiac Enzymes:  Recent Labs Lab 01/15/17 2347  TROPONINI <0.03   CBG:  Recent Labs Lab 01/16/17 1725 01/16/17 2242 01/17/17 0834 01/17/17 1225  GLUCAP 227* 93 128* 191*   Sepsis Labs:  Recent Labs Lab 01/15/17 2347 01/17/17 0128  LATICACIDVEN 0.9 2.7*    Recent Results (from the past 240 hour(s))  Blood culture (routine x 2)     Status: None (Preliminary result)   Collection Time: 01/15/17 11:47 PM  Result Value Ref Range Status   Specimen Description BLOOD BLOOD RIGHT WRIST  Final   Special Requests   Final    BOTTLES DRAWN AEROBIC AND ANAEROBIC Blood Culture results may not be optimal due to an inadequate volume of blood received in culture bottles   Culture PENDING  Incomplete   Report Status PENDING  Incomplete  Culture, blood (routine x 2) Call MD if unable to obtain prior to antibiotics being given     Status: None (Preliminary result)   Collection Time: 01/16/17  4:46 AM  Result Value Ref Range Status   Specimen Description BLOOD BLOOD RIGHT HAND  Final   Special Requests   Final    BOTTLES DRAWN AEROBIC ONLY Blood Culture results may not be optimal due to an inadequate volume of blood received in culture bottles   Culture NO GROWTH < 12 HOURS  Final   Report Status PENDING  Incomplete  Urine culture     Status: Abnormal   Collection Time: 01/16/17  6:00 AM  Result Value Ref Range Status   Specimen Description URINE, RANDOM  Final   Special Requests NONE  Final   Culture >=100,000 COLONIES/mL  YEAST (A)  Final   Report Status 01/17/2017 FINAL  Final     Radiology Studies: Dg Chest 2 View  Result Date: 01/15/2017 CLINICAL DATA:  Initial valuation for acute cough, low-grade fever. History of aspiration. EXAM: CHEST  2 VIEW COMPARISON:  Prior radiograph from 07/19/2016. FINDINGS: Cardiac and mediastinal silhouettes are grossly stable, and within normal limits. Aortic atherosclerosis. Chronic elevation of the left hemidiaphragm. Associated left basilar opacity may reflect atelectasis and/ or infiltrate. Lungs are otherwise clear. No pulmonary edema. No pleural effusion. No pneumothorax. Age indeterminate the left posterior ninth, and tenth rib fracture. No other acute osseous abnormality. Diffuse osteopenia. Exaggeration of the normal thoracic kyphosis. IMPRESSION: 1. Chronic elevation of the left hemidiaphragm with associated left basilar opacity. Atelectasis is favored, although a superimposed infiltrate not entirely excluded. 2. No other active cardiopulmonary disease. 3. Age indeterminate left posterior ninth rib fracture. Correlation with physical exam recommended. 4. Aortic atherosclerosis. Electronically Signed   By: Jeannine Boga M.D.   On: 01/15/2017 20:15    Scheduled Meds: . adenosine      . aspirin EC  81 mg Oral Daily  . atorvastatin  20 mg Oral QHS  . cholecalciferol  2,000 Units Oral Daily  . insulin aspart  0-15 Units Subcutaneous TID WC  . insulin aspart  0-5 Units Subcutaneous QHS  . ipratropium  500 mcg Nebulization Daily  . iron polysaccharides  150 mg Oral Daily  . levofloxacin (LEVAQUIN) IV  750 mg Intravenous Q48H  . linagliptin  5 mg Oral q morning - 10a  . magnesium oxide  200 mg Oral Daily  . metoprolol succinate  25 mg Oral Daily  . naphazoline-glycerin  1 drop Both Eyes q morning - 10a  . pantoprazole  40 mg Oral Daily  . predniSONE  20 mg Oral Q breakfast  . raloxifene  60 mg Oral q morning - 10a  . tamoxifen  20 mg Oral Daily   Continuous  Infusions: . sodium chloride 100 mL/hr at 01/17/17 0230     LOS: 1 day   Emmalou Hunger, MD FACP Hospitalist.   If 7PM-7AM, please contact night-coverage [email or web address] Password [email or web address] 01/17/2017, 1:07 PM

## 2017-01-17 NOTE — Progress Notes (Signed)
CRITICAL VALUE ALERT  Critical value received:  Lactic Acid 2.7  Date of notification:  01/17/2017  Time of notification:  02:25  Critical value read back: yes  Nurse who received alert:  Stephannie Peters  MD notified (1st page):  Dr. Shanon Brow  Time of first page:  02:28  MD notified (2nd page):  Time of second page:  Responding MD:  Dr. Shanon Brow  Time MD responded:  [phone]

## 2017-01-18 LAB — GLUCOSE, CAPILLARY
GLUCOSE-CAPILLARY: 200 mg/dL — AB (ref 65–99)
GLUCOSE-CAPILLARY: 235 mg/dL — AB (ref 65–99)
Glucose-Capillary: 141 mg/dL — ABNORMAL HIGH (ref 65–99)
Glucose-Capillary: 140 mg/dL — ABNORMAL HIGH (ref 65–99)

## 2017-01-18 MED ORDER — FLUCONAZOLE IN SODIUM CHLORIDE 100-0.9 MG/50ML-% IV SOLN
100.0000 mg | Freq: Once | INTRAVENOUS | Status: DC
  Filled 2017-01-18: qty 50

## 2017-01-18 MED ORDER — PREDNISONE 10 MG PO TABS
5.0000 mg | ORAL_TABLET | Freq: Every day | ORAL | Status: DC
  Administered 2017-01-19: 5 mg via ORAL
  Filled 2017-01-18: qty 1

## 2017-01-18 MED ORDER — DILTIAZEM HCL 25 MG/5ML IV SOLN
10.0000 mg | Freq: Once | INTRAVENOUS | Status: AC
  Administered 2017-01-18: 10 mg via INTRAVENOUS
  Filled 2017-01-18: qty 5

## 2017-01-18 MED ORDER — DILTIAZEM HCL ER COATED BEADS 120 MG PO CP24
120.0000 mg | ORAL_CAPSULE | Freq: Every day | ORAL | Status: DC
  Administered 2017-01-18 – 2017-01-20 (×3): 120 mg via ORAL
  Filled 2017-01-18 (×3): qty 1

## 2017-01-18 MED ORDER — DILTIAZEM LOAD VIA INFUSION
10.0000 mg | Freq: Once | INTRAVENOUS | Status: DC
  Filled 2017-01-18: qty 10

## 2017-01-18 MED ORDER — METOPROLOL TARTRATE 25 MG PO TABS
12.5000 mg | ORAL_TABLET | Freq: Once | ORAL | Status: AC
  Administered 2017-01-18: 12.5 mg via ORAL
  Filled 2017-01-18: qty 1

## 2017-01-18 MED ORDER — GUAIFENESIN-DM 100-10 MG/5ML PO SYRP
5.0000 mL | ORAL_SOLUTION | ORAL | Status: DC | PRN
  Administered 2017-01-18 – 2017-01-19 (×3): 5 mL via ORAL
  Filled 2017-01-18 (×3): qty 5

## 2017-01-18 NOTE — Consult Note (Signed)
[facility] Nurse wound consult note Reason for Consult:Stage 2 pressure injury to mid-thoracic area, Moisture Associated Skin Damage (MASD), specifically incontinence associated dermatitis (IAD) to the buttocks and gluteal skin fold. Medication reaction with peeling tissue in the bilateral inguinal areas Wound type:Pressure, moisture (IAD, ITD) Pressure Injury POA: Yes Measurement:Mid thoracic: 2cm x 1.5cm x 0.1cm, pink, moist, scant exudate Bilateral buttocks with scattered partial thickness tissue loss from IAD and friction, a deeper crater measuring 1cm x 0.5cm x 0.2cm with moist pink base and scant exudate is at the apex of the gluteal crease.   Wound bed:See above Drainage (amount, consistency, odor) As described above Periwound:Intact, dry Dressing procedure/placement/frequency:Exquisite skin care is being provided by family, turning and repositioning of this frail elder occurs several times an hour and with every incontinence episode.  I have reiterated the importance of heel floatation for pressure injury prevention and provided products for cleansing that are not easily available at home.  All areas are improved since hospitalization, expect resolution of the IAD and Stage 2 pressure area within the next two weeks.  To expedite the resolution of the intertriginous dermatitis in the bilateral inguinal area, a one time does of diflucan is suggested.  If you agree, please order. [place] nursing team will not follow, but will remain available to this patient, the nursing and medical teams.  Please re-consult if needed. Thanks, Maudie Flakes, MSN, RN, Day, [facility]  Pager# [phone]

## 2017-01-18 NOTE — Progress Notes (Signed)
PROGRESS NOTE    [name]  YSA:[id number] DOB: August 21, 1926 DOA: 01/15/2017 PCP: Moshe Cipro, MD    Brief Narrative: 83 yo with kyphosis, CAD, lives at home with one of her daughters, bedbound, with hx of RA on chronic steroid, kyphosis, DM2, asthma, admitted for CAP. She has yellow bloody sputum, and was started on IV Rocephin and Zithromax. She had mild lip swelling yesterday, so I suspect she has allergy to cephalosporin.  Her Rocephin/ZIthromax was changed to IV Levaquin, renally adjusted.  She is much better today.  She developed PSVT yesterday, and was given extra dose of BB, and Duoneb changed to Zopenex. She still had another brief episode of PSVT.  Appreciate woundcare recommendation.   Assessment & Plan:   Principal Problem:   CAP (community acquired pneumonia) Active Problems:   Rheumatoid arthritis ([place])   HTN (hypertension)   Diabetes type 2, controlled ([place])   Kyphosis   Asthma, chronic   Dehydration   Pressure injury of skin   CAP:  Will continue with IV Levaquin.  Will d/c home when stable.   PSVT:  Will change BB to Diltiazem at 120mg  per day extended release.  Will continue with telemetry.   Steroid dependent for RA:  She was given 20mg  due to her illness.  BP is OK now, so will taper down to her home dose.   DVT prophylaxis: SUBQ heparin.  Code Status: FULL CODE.  Family Communication: 2 different daughters updated today.  Disposition Plan: Home.   Consultants:   None.   Procedures:   None.   Antimicrobials: Anti-infectives    Start     Dose/Rate Route Frequency Ordered Stop   01/18/17 1700  fluconazole (DIFLUCAN) IVPB 100 mg     100 mg 50 mL/hr over 60 Minutes Intravenous  Once 01/18/17 1653     01/17/17 0400  azithromycin (ZITHROMAX) 500 mg in dextrose 5 % 250 mL IVPB  Status:  Discontinued     500 mg 250 mL/hr over 60 Minutes Intravenous Every 24 hours 01/16/17 0420 01/16/17 1212   01/17/17 0300  cefTRIAXone (ROCEPHIN) 1 g in  dextrose 5 % 50 mL IVPB  Status:  Discontinued     1 g 100 mL/hr over 30 Minutes Intravenous Every 24 hours 01/16/17 0420 01/16/17 1212   01/17/17 0200  levofloxacin (LEVAQUIN) IVPB 750 mg     750 mg 100 mL/hr over 90 Minutes Intravenous Every 48 hours 01/16/17 1226     01/16/17 1300  levofloxacin (LEVAQUIN) IVPB 750 mg  Status:  Discontinued     750 mg 100 mL/hr over 90 Minutes Intravenous Every 48 hours 01/16/17 1212 01/16/17 1226   01/16/17 1000  cefpodoxime (VANTIN) tablet 200 mg  Status:  Discontinued     200 mg Oral Every 12 hours 01/16/17 0420 01/16/17 0517   01/16/17 0230  cefTRIAXone (ROCEPHIN) 1 g in dextrose 5 % 50 mL IVPB     1 g 100 mL/hr over 30 Minutes Intravenous  Once 01/16/17 0221 01/16/17 0319   01/16/17 0230  azithromycin (ZITHROMAX) 500 mg in dextrose 5 % 250 mL IVPB     500 mg 250 mL/hr over 60 Minutes Intravenous  Once 01/16/17 0221 01/16/17 0508       Subjective:   Doing well.   Objective: Vitals:   01/18/17 0327 01/18/17 0649 01/18/17 0759 01/18/17 1451  BP: 111/64 123/75  (!) 126/58  Pulse:  90  96  Resp:  18  16  Temp:  99 F (37.2 C)  98.6 F (37 C)  TempSrc:  Oral  Oral  SpO2:  90% 91% 100%  Weight:      Height:        Intake/Output Summary (Last 24 hours) at 01/18/17 1653 Last data filed at 01/18/17 1300  Gross per 24 hour  Intake              240 ml  Output                0 ml  Net              240 ml   Filed Weights   01/15/17 1924 01/16/17 0421  Weight: 48.1 kg (106 lb) 46.1 kg (101 lb 10.1 oz)    Examination:  General exam: Appears calm and comfortable  Respiratory system: Clear to auscultation. Respiratory effort normal. Cardiovascular system: S1 & S2 heard, RRR. No JVD, murmurs, rubs, gallops or clicks. No pedal edema. Gastrointestinal system: Abdomen is nondistended, soft and nontender. No organomegaly or masses felt. Normal bowel sounds heard. Central nervous system: Alert and oriented. No focal neurological  deficits. Extremities: Symmetric 5 x 5 power. Skin: No rashes, lesions or ulcers Psychiatry: Judgement and insight appear normal. Mood & affect appropriate.   Data Reviewed: I have personally reviewed following labs and imaging studies  CBC:  Recent Labs Lab 01/15/17 2249 01/16/17 0446  WBC 12.3* 9.7  NEUTROABS 8.8* 7.2  HGB 10.5* 9.4*  HCT 33.1* 30.7*  MCV 90.2 92.2  PLT 172 540   Basic Metabolic Panel:  Recent Labs Lab 01/15/17 2249 01/16/17 0446  NA 135 134*  K 4.1 4.1  CL 100* 101  CO2 30 26  GLUCOSE 251* 300*  BUN 18 16  CREATININE 1.07* 1.01*  CALCIUM 8.2* 7.7*   GFR: Estimated Creatinine Clearance: 28 mL/min (A) (by C-G formula based on SCr of 1.01 mg/dL (H)). Liver Function Tests:  Recent Labs Lab 01/15/17 2249  AST 11*  ALT 11*  ALKPHOS 49  BILITOT 0.4  PROT 5.3*  ALBUMIN 2.2*   Cardiac Enzymes:  Recent Labs Lab 01/15/17 2347  TROPONINI <0.03   CBG:  Recent Labs Lab 01/17/17 2053 01/18/17 0332 01/18/17 0750 01/18/17 1126 01/18/17 1631  GLUCAP 325* 141* 140* 200* 235*   Sepsis Labs:  Recent Labs Lab 01/15/17 2347 01/17/17 0128  LATICACIDVEN 0.9 2.7*    Recent Results (from the past 240 hour(s))  Blood culture (routine x 2)     Status: None (Preliminary result)   Collection Time: 01/15/17 11:47 PM  Result Value Ref Range Status   Specimen Description BLOOD BLOOD RIGHT WRIST  Final   Special Requests   Final    BOTTLES DRAWN AEROBIC AND ANAEROBIC Blood Culture results may not be optimal due to an inadequate volume of blood received in culture bottles   Culture PENDING  Incomplete   Report Status PENDING  Incomplete  Culture, blood (routine x 2) Call MD if unable to obtain prior to antibiotics being given     Status: None (Preliminary result)   Collection Time: 01/16/17  4:46 AM  Result Value Ref Range Status   Specimen Description BLOOD BLOOD RIGHT HAND  Final   Special Requests   Final    BOTTLES DRAWN AEROBIC ONLY Blood  Culture results may not be optimal due to an inadequate volume of blood received in culture bottles   Culture NO GROWTH 2 DAYS  Final   Report Status PENDING  Incomplete  Urine culture  Status: Abnormal   Collection Time: 01/16/17  6:00 AM  Result Value Ref Range Status   Specimen Description URINE, RANDOM  Final   Special Requests NONE  Final   Culture >=100,000 COLONIES/mL YEAST (A)  Final   Report Status 01/17/2017 FINAL  Final     Radiology Studies: No results found.  Scheduled Meds: . aspirin EC  81 mg Oral Daily  . atorvastatin  20 mg Oral QHS  . cholecalciferol  2,000 Units Oral Daily  . diltiazem  120 mg Oral Daily  . fluconazole (DIFLUCAN) IV  100 mg Intravenous Once  . insulin aspart  0-15 Units Subcutaneous TID WC  . insulin aspart  0-5 Units Subcutaneous QHS  . ipratropium  500 mcg Nebulization Daily  . iron polysaccharides  150 mg Oral Daily  . levofloxacin (LEVAQUIN) IV  750 mg Intravenous Q48H  . linagliptin  5 mg Oral q morning - 10a  . magnesium oxide  200 mg Oral Daily  . naphazoline-glycerin  1 drop Both Eyes q morning - 10a  . pantoprazole  40 mg Oral Daily  . [START ON 01/19/2017] predniSONE  5 mg Oral Q breakfast  . raloxifene  60 mg Oral q morning - 10a  . tamoxifen  20 mg Oral Daily   Continuous Infusions: . sodium chloride 50 mL/hr at 01/17/17 2250     LOS: 2 days   Roselinda Bahena, MD [facility].   If 7PM-7AM, please contact night-coverage [email or web address] Password [facility] 01/18/2017, 4:53 PM

## 2017-01-19 LAB — GLUCOSE, CAPILLARY
GLUCOSE-CAPILLARY: 201 mg/dL — AB (ref 65–99)
GLUCOSE-CAPILLARY: 209 mg/dL — AB (ref 65–99)
Glucose-Capillary: 185 mg/dL — ABNORMAL HIGH (ref 65–99)
Glucose-Capillary: 276 mg/dL — ABNORMAL HIGH (ref 65–99)
Glucose-Capillary: 233 mg/dL — ABNORMAL HIGH (ref 65–99)

## 2017-01-19 MED ORDER — IPRATROPIUM BROMIDE 0.02 % IN SOLN
0.5000 mg | Freq: Three times a day (TID) | RESPIRATORY_TRACT | Status: DC
  Administered 2017-01-19 – 2017-01-20 (×3): 0.5 mg via RESPIRATORY_TRACT
  Filled 2017-01-19 (×4): qty 2.5

## 2017-01-19 MED ORDER — LEVOFLOXACIN 750 MG PO TABS
750.0000 mg | ORAL_TABLET | ORAL | Status: DC
  Filled 2017-01-19: qty 1

## 2017-01-19 MED ORDER — PREDNISONE 10 MG PO TABS
10.0000 mg | ORAL_TABLET | Freq: Every day | ORAL | Status: DC
  Administered 2017-01-20: 10 mg via ORAL
  Filled 2017-01-19: qty 1

## 2017-01-19 MED ORDER — FUROSEMIDE 10 MG/ML IJ SOLN: 40.0000 mg | Freq: Two times a day (BID) | INTRAMUSCULAR | Status: DC

## 2017-01-19 MED ORDER — FUROSEMIDE 10 MG/ML IJ SOLN
40.0000 mg | Freq: Once | INTRAMUSCULAR | Status: AC
  Administered 2017-01-19: 40 mg via INTRAVENOUS
  Filled 2017-01-19: qty 4

## 2017-01-19 MED ORDER — LEVALBUTEROL HCL 0.63 MG/3ML IN NEBU
0.6300 mg | INHALATION_SOLUTION | Freq: Three times a day (TID) | RESPIRATORY_TRACT | Status: DC
  Administered 2017-01-19 – 2017-01-20 (×3): 0.63 mg via RESPIRATORY_TRACT
  Filled 2017-01-19 (×4): qty 3

## 2017-01-19 MED ORDER — FLUCONAZOLE 100 MG PO TABS
50.0000 mg | ORAL_TABLET | Freq: Every day | ORAL | Status: DC
  Administered 2017-01-19 – 2017-01-20 (×2): 50 mg via ORAL
  Filled 2017-01-19 (×2): qty 1

## 2017-01-19 MED ORDER — ALBUTEROL SULFATE (2.5 MG/3ML) 0.083% IN NEBU
2.5000 mg | INHALATION_SOLUTION | RESPIRATORY_TRACT | Status: DC | PRN
  Administered 2017-01-19 (×2): 2.5 mg via RESPIRATORY_TRACT
  Filled 2017-01-19 (×2): qty 3

## 2017-01-19 MED ORDER — METFORMIN HCL 500 MG PO TABS: 1000.0000 mg | ORAL_TABLET | Freq: Two times a day (BID) | ORAL | Status: DC

## 2017-01-19 NOTE — Care Management Important Message (Signed)
Important Message  Patient Details  Name: [name] MRN: [id number] Date of Birth: [birth year]   Medicare Important Message Given:  Yes    Sherald Barge, RN 01/19/2017, 2:11 PM

## 2017-01-19 NOTE — Progress Notes (Addendum)
PROGRESS NOTE    [name]  YNW:[id number] DOB: [birth year] DOA: 01/15/2017 PCP: Moshe Cipro, MD    Brief Narrative: 83 yo with kyphosis, CAD, lives at home with one of her daughters, bedbound, with hx of RA on chronic steroid, kyphosis, DM2, asthma, admitted for CAP. She has yellow bloody sputum, and was started on IV Rocephin and Zithromax. She hadmild lip swelling yesterday, so I suspect she has allergy to cephalosporin. Her Rocephin/ZIthromax was changed to IV Levaquin, renally adjusted. She is much better today. She developed PSVT yesterday, and was given extra dose of BB, and Duoneb changed to Zopenex. She still had another brief episode of PSVT.  Appreciate woundcare recommendation.    Assessment & Plan:   Principal Problem:   CAP (community acquired pneumonia) Active Problems:   Rheumatoid arthritis ([place])   HTN (hypertension)   Diabetes type 2, controlled ([place])   Kyphosis   Asthma, chronic   Dehydration   Pressure injury of skin   CAP: Will continue with IV Levaquin. Will d/c home when stable.   Volume overload:  Suspect she was a little volume overload.  D/C IVF, give Lasix x 1 dose today.   PSVT: Will change BB to Diltiazem at 120mg  per day extended release.  Will continue with telemetry.   Steroid dependent for RA: She was given 20mg  due to her illness.  BP is OK now, will increase her Prednisone to 10mg  per day.  Candida infection:  Will give Diflucan at 50mg  per day for a week.   DM:  BS OK.  Continue with SSI as required.   DVT prophylaxis:SUBQ heparin.  Code Status:FULL CODE.  Family Communication:2 different daughters updated today.  Disposition Plan:Home.    Antimicrobials: Anti-infectives    Start     Dose/Rate Route Frequency Ordered Stop   01/21/17 1000  levofloxacin (LEVAQUIN) tablet 750 mg     750 mg Oral Every 48 hours 01/19/17 1012     01/19/17 1200  fluconazole (DIFLUCAN) tablet 50 mg     50 mg Oral Daily 01/19/17 1130       01/18/17 1700  fluconazole (DIFLUCAN) IVPB 100 mg     100 mg 50 mL/hr over 60 Minutes Intravenous  Once 01/18/17 1653     01/17/17 0400  azithromycin (ZITHROMAX) 500 mg in dextrose 5 % 250 mL IVPB  Status:  Discontinued     500 mg 250 mL/hr over 60 Minutes Intravenous Every 24 hours 01/16/17 0420 01/16/17 1212   01/17/17 0300  cefTRIAXone (ROCEPHIN) 1 g in dextrose 5 % 50 mL IVPB  Status:  Discontinued     1 g 100 mL/hr over 30 Minutes Intravenous Every 24 hours 01/16/17 0420 01/16/17 1212   01/17/17 0200  levofloxacin (LEVAQUIN) IVPB 750 mg  Status:  Discontinued     750 mg 100 mL/hr over 90 Minutes Intravenous Every 48 hours 01/16/17 1226 01/19/17 1011   01/16/17 1300  levofloxacin (LEVAQUIN) IVPB 750 mg  Status:  Discontinued     750 mg 100 mL/hr over 90 Minutes Intravenous Every 48 hours 01/16/17 1212 01/16/17 1226   01/16/17 1000  cefpodoxime (VANTIN) tablet 200 mg  Status:  Discontinued     200 mg Oral Every 12 hours 01/16/17 0420 01/16/17 0517   01/16/17 0230  cefTRIAXone (ROCEPHIN) 1 g in dextrose 5 % 50 mL IVPB     1 g 100 mL/hr over 30 Minutes Intravenous  Once 01/16/17 0221 01/16/17 0319   01/16/17 0230  azithromycin (ZITHROMAX) 500 mg  in dextrose 5 % 250 mL IVPB     500 mg 250 mL/hr over 60 Minutes Intravenous  Once 01/16/17 0221 01/16/17 0508       Subjective:   Had some SOB and not feeling well today.   Objective: Vitals:   01/19/17 0610 01/19/17 0959 01/19/17 1400 01/19/17 1411  BP:    109/64  Pulse:    97  Resp:    20  Temp:    98.5 F (36.9 C)  TempSrc:    Oral  SpO2: 98% 92% 98% 100%  Weight:      Height:        Intake/Output Summary (Last 24 hours) at 01/19/17 1509 Last data filed at 01/19/17 1412  Gross per 24 hour  Intake              600 ml  Output                0 ml  Net              600 ml   Filed Weights   01/15/17 1924 01/16/17 0421  Weight: 48.1 kg (106 lb) 46.1 kg (101 lb 10.1 oz)    Examination:  General exam: Appears calm and  comfortable  Respiratory system: bilateral wheezing and crackles at both bases.  Cardiovascular system: S1 & S2 heard, RRR. No JVD, murmurs, rubs, gallops or clicks. No pedal edema. Gastrointestinal system: Abdomen is nondistended, soft and nontender. No organomegaly or masses felt. Normal bowel sounds heard. Central nervous system: Alert and oriented. No focal neurological deficits. Extremities: Symmetric 5 x 5 power. Skin: No rashes, lesions or ulcers Psychiatry: Judgement and insight appear normal. Mood & affect appropriate.   Data Reviewed: I have personally reviewed following labs and imaging studies  CBC:  Recent Labs Lab 01/15/17 2249 01/16/17 0446  WBC 12.3* 9.7  NEUTROABS 8.8* 7.2  HGB 10.5* 9.4*  HCT 33.1* 30.7*  MCV 90.2 92.2  PLT 172 563   Basic Metabolic Panel:  Recent Labs Lab 01/15/17 2249 01/16/17 0446  NA 135 134*  K 4.1 4.1  CL 100* 101  CO2 30 26  GLUCOSE 251* 300*  BUN 18 16  CREATININE 1.07* 1.01*  CALCIUM 8.2* 7.7*   GFR: Estimated Creatinine Clearance: 28 mL/min (A) (by C-G formula based on SCr of 1.01 mg/dL (H)). Liver Function Tests:  Recent Labs Lab 01/15/17 2249  AST 11*  ALT 11*  ALKPHOS 49  BILITOT 0.4  PROT 5.3*  ALBUMIN 2.2*   Cardiac Enzymes:  Recent Labs Lab 01/15/17 2347  TROPONINI <0.03   CBG:  Recent Labs Lab 01/18/17 1126 01/18/17 1631 01/19/17 0048 01/19/17 0806 01/19/17 1204  GLUCAP 200* 235* 209* 185* 201*   Sepsis Labs:  Recent Labs Lab 01/15/17 2347 01/17/17 0128  LATICACIDVEN 0.9 2.7*    Recent Results (from the past 240 hour(s))  Blood culture (routine x 2)     Status: None (Preliminary result)   Collection Time: 01/15/17 11:47 PM  Result Value Ref Range Status   Specimen Description BLOOD BLOOD RIGHT WRIST  Final   Special Requests   Final    BOTTLES DRAWN AEROBIC AND ANAEROBIC Blood Culture results may not be optimal due to an inadequate volume of blood received in culture bottles    Culture PENDING  Incomplete   Report Status PENDING  Incomplete  Culture, blood (routine x 2) Call MD if unable to obtain prior to antibiotics being given     Status:  None (Preliminary result)   Collection Time: 01/16/17  4:46 AM  Result Value Ref Range Status   Specimen Description BLOOD BLOOD RIGHT HAND  Final   Special Requests   Final    BOTTLES DRAWN AEROBIC ONLY Blood Culture results may not be optimal due to an inadequate volume of blood received in culture bottles   Culture NO GROWTH 3 DAYS  Final   Report Status PENDING  Incomplete  Urine culture     Status: Abnormal   Collection Time: 01/16/17  6:00 AM  Result Value Ref Range Status   Specimen Description URINE, RANDOM  Final   Special Requests NONE  Final   Culture >=100,000 COLONIES/mL YEAST (A)  Final   Report Status 01/17/2017 FINAL  Final     Radiology Studies: No results found.  Scheduled Meds: . aspirin EC  81 mg Oral Daily  . atorvastatin  20 mg Oral QHS  . cholecalciferol  2,000 Units Oral Daily  . diltiazem  120 mg Oral Daily  . fluconazole (DIFLUCAN) IV  100 mg Intravenous Once  . fluconazole  50 mg Oral Daily  . insulin aspart  0-15 Units Subcutaneous TID WC  . insulin aspart  0-5 Units Subcutaneous QHS  . ipratropium  0.5 mg Nebulization TID  . iron polysaccharides  150 mg Oral Daily  . levalbuterol  0.63 mg Nebulization TID  . [START ON 01/21/2017] levofloxacin  750 mg Oral Q48H  . linagliptin  5 mg Oral q morning - 10a  . magnesium oxide  200 mg Oral Daily  . naphazoline-glycerin  1 drop Both Eyes q morning - 10a  . pantoprazole  40 mg Oral Daily  . [START ON 01/20/2017] predniSONE  10 mg Oral Q breakfast  . raloxifene  60 mg Oral q morning - 10a  . tamoxifen  20 mg Oral Daily   Continuous Infusions:   LOS: 3 days   Teana Lindahl, MD FACP Hospitalist.   If 7PM-7AM, please contact night-coverage [email or web address] Password [email or web address] 01/19/2017, 3:09 PM

## 2017-01-20 LAB — GLUCOSE, CAPILLARY
GLUCOSE-CAPILLARY: 137 mg/dL — AB (ref 65–99)
Glucose-Capillary: 186 mg/dL — ABNORMAL HIGH (ref 65–99)

## 2017-01-20 MED ORDER — LEVALBUTEROL HCL 0.63 MG/3ML IN NEBU: 0.6300 mg | INHALATION_SOLUTION | Freq: Three times a day (TID) | RESPIRATORY_TRACT | 12 refills | Status: DC

## 2017-01-20 MED ORDER — PREDNISONE 5 MG PO TABS: 10.0000 mg | ORAL_TABLET | Freq: Every day | ORAL | 0 refills | Status: DC

## 2017-01-20 MED ORDER — FLUCONAZOLE 50 MG PO TABS: 50.0000 mg | ORAL_TABLET | Freq: Every day | ORAL | 0 refills | Status: DC

## 2017-01-20 MED ORDER — DILTIAZEM HCL ER COATED BEADS 120 MG PO CP24: 120.0000 mg | ORAL_CAPSULE | Freq: Every day | ORAL | 2 refills | Status: DC

## 2017-01-20 MED ORDER — LEVOFLOXACIN 750 MG PO TABS: 750.0000 mg | ORAL_TABLET | ORAL | 0 refills | Status: DC

## 2017-01-20 NOTE — Care Management Note (Signed)
Case Management Note  Patient Details  Name: [name] MRN: [id number] Date of Birth: 31-Jan-1926  Expected Discharge Date:  01/20/17               Expected Discharge Plan:  Home/Self Care  In-House Referral:  NA  Discharge planning Services  CM Consult  Post Acute Care Choice:  NA Choice offered to:  NA  Status of Service:  Completed, signed off  If discussed at [place] Length of Stay Meetings, dates discussed: 01/20/2017   Additional Comments: Pt discharging home today. No needs communicated. Pt's family at bedside to transport pt home.  Sherald Barge, RN 01/20/2017, 2:40 PM

## 2017-01-20 NOTE — Discharge Summary (Signed)
Physician Discharge Summary  [name] TIR:[id number] DOB: 1926/08/02 DOA: 01/15/2017  PCP: Moshe Cipro, MD  Admit date: 01/15/2017 Discharge date: 01/20/2017  Admitted From: Home.  Disposition:  Home.   Recommendations for Outpatient Follow-up:  1. Follow up with PCP in 1-2 weeks  Home Health: None.  Equipment/Devices: No new equipement required.  Discharge Condition: No SOB, minimal coughs.  No fever, or chills.  CODE STATUS:FULL CODE.  Diet recommendation: Carb modified diet.   Brief/Interim Summary: Patient was admitted by Dr Shanon Brow on January 16, 2017 for CAP.  As per her H and P:  " [name] is a 83 y.o. female with medical history significant of kyphosis, CAD, PUD, bedbound status comes in from PCP office for fever and cough.  No n/v/d.  No swelling in legs.  Pt lives with her husband and daughter, she is bedbound due to her kyphosis.  No pain anywhere.  Pt found to have possible pna and referred for admission for such.    Review of Systems: As per HPI otherwise 10 point review of systems negative.   HOSPITAL COURSE:  She was given IV Rocephin and IV Zithromax, and nebs.  She was found to have swollen lips, and both were discontinued.  I suspect it was the cephalosporin, so it was marked in her chart as allergy.  She was then placed on Levoquin, adjusted renally so she was only required 750mg  IV Q 48 hours.  Her lips promptly returned to normal.  Due to the fact that she has been on chronic steroid, for her RA, her Prednisone was increased from 5mg  per day to 20mg  per day.  She felt fine, but one day prior to discharge, she was having SOB and increased wheezing, not responding to neb Tx.  Her IVF was stopped, and she was given one dose of IV Lasix at 40mg .  This promptly made her symptoms went away.  She was kept in the hospital, and her prednisone was retitrated to eventual 10mg  per day.  For her candida in the perineum area, wound care recommended local care, and diflucan.  She  was given one dose at 100mg  IV, then will receive Diflucan orally at 50mg  per day for a week.  She feels well today, and will be discharged to home.  She will complete another 6 days of oral Levoquin at 750mg  QOD for three doses.  During her hospitalization, she developed several episodes of PSVT, and her BB was converted to CCB, and she did well with it.  She will continue with oral cardiazem.  Thank you for allowing me to participate in her care.  Good Day.   Discharge Diagnoses:  Principal Problem:   CAP (community acquired pneumonia) Active Problems:   Rheumatoid arthritis ([place])   HTN (hypertension)   Diabetes type 2, controlled ([place])   Kyphosis   Asthma, chronic   Dehydration   Pressure injury of skin    Discharge Instructions   Allergies as of 01/20/2017      Reactions   Aspirin Other (See Comments)   Cannot take uncoated due to stomach ulcers   Megace [megestrol]    abd pain   Rocephin [ceftriaxone Sodium In Dextrose] Swelling      Medication List    STOP taking these medications   albuterol (2.5 MG/3ML) 0.083% nebulizer solution Commonly known as:  PROVENTIL   cefpodoxime 200 MG tablet Commonly known as:  VANTIN   CORICIDIN HBP COLD/FLU PO   ipratropium 0.02 % nebulizer solution Commonly known  as:  ATROVENT   metFORMIN 500 MG tablet Commonly known as:  GLUCOPHAGE   metoprolol succinate 25 MG 24 hr tablet Commonly known as:  TOPROL XL   PROVENTIL HFA 108 (90 Base) MCG/ACT inhaler Generic drug:  albuterol   traMADol 50 MG tablet Commonly known as:  ULTRAM     TAKE these medications   aspirin EC 81 MG tablet Take 81 mg by mouth 2 (two) times daily.   atorvastatin 10 MG tablet Commonly known as:  LIPITOR Take 10 mg by mouth daily. What changed:  Another medication with the same name was removed. Continue taking this medication, and follow the directions you see here.   budesonide-formoterol 160-4.5 MCG/ACT inhaler Commonly known as:   SYMBICORT Inhale 2 puffs into the lungs 2 (two) times daily.   CENTRUM SILVER ADULT 50+ PO Take 1 tablet by mouth every morning.   ciclopirox 8 % solution Commonly known as:  PENLAC APPLY TO FUNGAL TOENAILS EVERY DAY   CLEAR EYES ALL SEASONS 5-6 MG/ML Soln Generic drug:  Polyvinyl Alcohol-Povidone Apply 1 drop to eye every morning.   D-MANNOSE PO Take 2 capsules by mouth daily.   diltiazem 120 MG 24 hr capsule Commonly known as:  CARDIZEM CD Take 1 capsule (120 mg total) by mouth daily.   fluconazole 50 MG tablet Commonly known as:  DIFLUCAN Take 1 tablet (50 mg total) by mouth daily.   lactobacillus acidophilus & bulgar chewable tablet CHEW 1 TABLET BY MOUTH EVERY DAY   levalbuterol 0.63 MG/3ML nebulizer solution Commonly known as:  XOPENEX Take 3 mLs (0.63 mg total) by nebulization 3 (three) times daily.   levofloxacin 750 MG tablet Commonly known as:  LEVAQUIN Take 1 tablet (750 mg total) by mouth every other day. Start taking on:  01/21/2017   magnesium oxide 400 (241.3 Mg) MG tablet Commonly known as:  MAG-OX Take 0.5 tablets (200 mg total) by mouth daily. What changed:  how much to take  additional instructions   pantoprazole 40 MG tablet Commonly known as:  PROTONIX Take 40 mg by mouth daily.   Polysacchar Iron-FA-B12 150-1-25 MG-MG-MCG Caps Commonly known as:  FERREX 150 FORTE Take 1 capsule by mouth daily.   predniSONE 5 MG tablet Commonly known as:  DELTASONE Take 2 tablets (10 mg total) by mouth daily with breakfast. What changed:  how much to take   raloxifene 60 MG tablet Commonly known as:  EVISTA Take 60 mg by mouth every morning.   tamoxifen 20 MG tablet Commonly known as:  NOLVADEX TAKE 1 TABLET BY MOUTH EVERY DAY   TRADJENTA 5 MG Tabs tablet Generic drug:  linagliptin Take 5 mg by mouth every morning.   Vitamin D3 1000 units Caps Take 2,000 Units by mouth daily.       Allergies  Allergen Reactions  . Aspirin Other (See  Comments)    Cannot take uncoated due to stomach ulcers  . Megace [Megestrol]     abd pain   . Rocephin [Ceftriaxone Sodium In Dextrose] Swelling    Procedures/Studies: Dg Chest 2 View  Result Date: 01/15/2017 CLINICAL DATA:  Initial valuation for acute cough, low-grade fever. History of aspiration. EXAM: CHEST  2 VIEW COMPARISON:  Prior radiograph from 07/19/2016. FINDINGS: Cardiac and mediastinal silhouettes are grossly stable, and within normal limits. Aortic atherosclerosis. Chronic elevation of the left hemidiaphragm. Associated left basilar opacity may reflect atelectasis and/ or infiltrate. Lungs are otherwise clear. No pulmonary edema. No pleural effusion. No pneumothorax. Age indeterminate  the left posterior ninth, and tenth rib fracture. No other acute osseous abnormality. Diffuse osteopenia. Exaggeration of the normal thoracic kyphosis. IMPRESSION: 1. Chronic elevation of the left hemidiaphragm with associated left basilar opacity. Atelectasis is favored, although a superimposed infiltrate not entirely excluded. 2. No other active cardiopulmonary disease. 3. Age indeterminate left posterior ninth rib fracture. Correlation with physical exam recommended. 4. Aortic atherosclerosis. Electronically Signed   By: Jeannine Boga M.D.   On: 01/15/2017 20:15     Subjective:  No complaints.   Discharge Exam: Vitals:   01/19/17 2122 01/20/17 0700  BP: 136/86 138/68  Pulse: 97 97  Resp: 18 18  Temp: 98.4 F (36.9 C)    Vitals:   01/19/17 2122 01/19/17 2350 01/20/17 0700 01/20/17 0725  BP: 136/86  138/68   Pulse: 97  97   Resp: 18  18   Temp: 98.4 F (36.9 C)     TempSrc: Oral  Axillary   SpO2: 100% 95% 97% 98%  Weight:      Height:        General: Pt is alert, awake, not in acute distress Cardiovascular: RRR, S1/S2 +, no rubs, no gallops Respiratory: CTA bilaterally, no wheezing, no rhonchi Abdominal: Soft, NT, ND, bowel sounds + Extremities: no edema, no  cyanosis    The results of significant diagnostics from this hospitalization (including imaging, microbiology, ancillary and laboratory) are listed below for reference.     Microbiology: Recent Results (from the past 240 hour(s))  Blood culture (routine x 2)     Status: None (Preliminary result)   Collection Time: 01/15/17 11:47 PM  Result Value Ref Range Status   Specimen Description BLOOD BLOOD RIGHT WRIST  Final   Special Requests   Final    BOTTLES DRAWN AEROBIC AND ANAEROBIC Blood Culture results may not be optimal due to an inadequate volume of blood received in culture bottles   Culture PENDING  Incomplete   Report Status PENDING  Incomplete  Culture, blood (routine x 2) Call MD if unable to obtain prior to antibiotics being given     Status: None (Preliminary result)   Collection Time: 01/16/17  4:46 AM  Result Value Ref Range Status   Specimen Description BLOOD BLOOD RIGHT HAND  Final   Special Requests   Final    BOTTLES DRAWN AEROBIC ONLY Blood Culture results may not be optimal due to an inadequate volume of blood received in culture bottles   Culture NO GROWTH 4 DAYS  Final   Report Status PENDING  Incomplete  Urine culture     Status: Abnormal   Collection Time: 01/16/17  6:00 AM  Result Value Ref Range Status   Specimen Description URINE, RANDOM  Final   Special Requests NONE  Final   Culture >=100,000 COLONIES/mL YEAST (A)  Final   Report Status 01/17/2017 FINAL  Final     Labs: BNP (last 3 results) No results for input(s): BNP in the last 8760 hours. Basic Metabolic Panel:  Recent Labs Lab 01/15/17 2249 01/16/17 0446  NA 135 134*  K 4.1 4.1  CL 100* 101  CO2 30 26  GLUCOSE 251* 300*  BUN 18 16  CREATININE 1.07* 1.01*  CALCIUM 8.2* 7.7*   Liver Function Tests:  Recent Labs Lab 01/15/17 2249  AST 11*  ALT 11*  ALKPHOS 49  BILITOT 0.4  PROT 5.3*  ALBUMIN 2.2*   CBC:  Recent Labs Lab 01/15/17 2249 01/16/17 0446  WBC 12.3* 9.7   NEUTROABS  8.8* 7.2  HGB 10.5* 9.4*  HCT 33.1* 30.7*  MCV 90.2 92.2  PLT 172 207   Cardiac Enzymes:  Recent Labs Lab 01/15/17 2347  TROPONINI <0.03   BNP: Invalid input(s): POCBNP CBG:  Recent Labs Lab 01/19/17 1204 01/19/17 1644 01/19/17 2136 01/20/17 0744 01/20/17 1056  GLUCAP 201* 276* 233* 137* 186*   Urinalysis    Component Value Date/Time   COLORURINE YELLOW 07/19/2016 1837   APPEARANCEUR CLOUDY (A) 07/19/2016 1837   LABSPEC <1.005 (L) 07/19/2016 1837   PHURINE 5.5 07/19/2016 1837   GLUCOSEU 250 (A) 07/19/2016 1837   HGBUR SMALL (A) 07/19/2016 1837   BILIRUBINUR NEGATIVE 07/19/2016 1837   KETONESUR NEGATIVE 07/19/2016 1837   PROTEINUR NEGATIVE 07/19/2016 1837   UROBILINOGEN 0.2 06/30/2015 1317   NITRITE NEGATIVE 07/19/2016 1837   LEUKOCYTESUR LARGE (A) 07/19/2016 1837   Microbiology Recent Results (from the past 240 hour(s))  Blood culture (routine x 2)     Status: None (Preliminary result)   Collection Time: 01/15/17 11:47 PM  Result Value Ref Range Status   Specimen Description BLOOD BLOOD RIGHT WRIST  Final   Special Requests   Final    BOTTLES DRAWN AEROBIC AND ANAEROBIC Blood Culture results may not be optimal due to an inadequate volume of blood received in culture bottles   Culture PENDING  Incomplete   Report Status PENDING  Incomplete  Culture, blood (routine x 2) Call MD if unable to obtain prior to antibiotics being given     Status: None (Preliminary result)   Collection Time: 01/16/17  4:46 AM  Result Value Ref Range Status   Specimen Description BLOOD BLOOD RIGHT HAND  Final   Special Requests   Final    BOTTLES DRAWN AEROBIC ONLY Blood Culture results may not be optimal due to an inadequate volume of blood received in culture bottles   Culture NO GROWTH 4 DAYS  Final   Report Status PENDING  Incomplete  Urine culture     Status: Abnormal   Collection Time: 01/16/17  6:00 AM  Result Value Ref Range Status   Specimen Description  URINE, RANDOM  Final   Special Requests NONE  Final   Culture >=100,000 COLONIES/mL YEAST (A)  Final   Report Status 01/17/2017 FINAL  Final     Time coordinating discharge: Over 30 minutes SIGNED:  Orvan Falconer, MD FACP [facility] 01/20/2017, 11:53 AM   If 7PM-7AM, please contact night-coverage [email or web address] Password [email or web address]

## 2017-01-20 NOTE — Progress Notes (Signed)
Patient discharged, accompanied with daughters in wheelchair, patient in stable condition.

## 2017-01-20 NOTE — Care Management Important Message (Signed)
Important Message  Patient Details  Name: [name] MRN: [id number] Date of Birth: Feb 13, 1926   Medicare Important Message Given:  Yes    Sherald Barge, RN 01/20/2017, 2:40 PM

## 2017-01-21 LAB — CULTURE, BLOOD (ROUTINE X 2): CULTURE: NO GROWTH

## 2017-01-22 ENCOUNTER — Ambulatory Visit (HOSPITAL_COMMUNITY): Payer: Medicare Other

## 2017-01-22 ENCOUNTER — Other Ambulatory Visit (HOSPITAL_COMMUNITY): Payer: Medicare Other

## 2017-01-27 LAB — CULTURE, BLOOD (ROUTINE X 2): CULTURE: NO GROWTH

## 2017-02-11 ENCOUNTER — Encounter (HOSPITAL_BASED_OUTPATIENT_CLINIC_OR_DEPARTMENT_OTHER): Payer: Medicare Other | Admitting: Oncology

## 2017-02-11 ENCOUNTER — Encounter (HOSPITAL_COMMUNITY): Payer: Medicare Other | Attending: Oncology

## 2017-02-11 ENCOUNTER — Encounter (HOSPITAL_COMMUNITY): Payer: Self-pay

## 2017-02-11 VITALS — BP 119/60 | HR 107 | Temp 98.6°F | Resp 18 | Wt 106.0 lb

## 2017-02-11 DIAGNOSIS — D696 Thrombocytopenia, unspecified: Secondary | ICD-10-CM | POA: Diagnosis not present

## 2017-02-11 DIAGNOSIS — Z79811 Long term (current) use of aromatase inhibitors: Secondary | ICD-10-CM | POA: Diagnosis not present

## 2017-02-11 DIAGNOSIS — Z993 Dependence on wheelchair: Secondary | ICD-10-CM | POA: Insufficient documentation

## 2017-02-11 DIAGNOSIS — C50912 Malignant neoplasm of unspecified site of left female breast: Secondary | ICD-10-CM | POA: Insufficient documentation

## 2017-02-11 DIAGNOSIS — Z9012 Acquired absence of left breast and nipple: Secondary | ICD-10-CM | POA: Diagnosis not present

## 2017-02-11 DIAGNOSIS — J45909 Unspecified asthma, uncomplicated: Secondary | ICD-10-CM | POA: Diagnosis not present

## 2017-02-11 DIAGNOSIS — I2510 Atherosclerotic heart disease of native coronary artery without angina pectoris: Secondary | ICD-10-CM | POA: Insufficient documentation

## 2017-02-11 DIAGNOSIS — I10 Essential (primary) hypertension: Secondary | ICD-10-CM | POA: Diagnosis not present

## 2017-02-11 DIAGNOSIS — Z9981 Dependence on supplemental oxygen: Secondary | ICD-10-CM | POA: Insufficient documentation

## 2017-02-11 DIAGNOSIS — Z8719 Personal history of other diseases of the digestive system: Secondary | ICD-10-CM | POA: Diagnosis not present

## 2017-02-11 DIAGNOSIS — Z170 Estrogen receptor positive status [ER+]: Secondary | ICD-10-CM

## 2017-02-11 DIAGNOSIS — M1990 Unspecified osteoarthritis, unspecified site: Secondary | ICD-10-CM | POA: Diagnosis not present

## 2017-02-11 DIAGNOSIS — M40209 Unspecified kyphosis, site unspecified: Secondary | ICD-10-CM | POA: Diagnosis not present

## 2017-02-11 DIAGNOSIS — C50412 Malignant neoplasm of upper-outer quadrant of left female breast: Secondary | ICD-10-CM | POA: Diagnosis present

## 2017-02-11 DIAGNOSIS — M069 Rheumatoid arthritis, unspecified: Secondary | ICD-10-CM | POA: Insufficient documentation

## 2017-02-11 DIAGNOSIS — Z7982 Long term (current) use of aspirin: Secondary | ICD-10-CM | POA: Diagnosis not present

## 2017-02-11 DIAGNOSIS — E611 Iron deficiency: Secondary | ICD-10-CM

## 2017-02-11 DIAGNOSIS — Z79810 Long term (current) use of selective estrogen receptor modulators (SERMs): Secondary | ICD-10-CM | POA: Insufficient documentation

## 2017-02-11 DIAGNOSIS — D649 Anemia, unspecified: Secondary | ICD-10-CM | POA: Insufficient documentation

## 2017-02-11 DIAGNOSIS — E119 Type 2 diabetes mellitus without complications: Secondary | ICD-10-CM | POA: Diagnosis not present

## 2017-02-11 DIAGNOSIS — K219 Gastro-esophageal reflux disease without esophagitis: Secondary | ICD-10-CM | POA: Insufficient documentation

## 2017-02-11 DIAGNOSIS — Z171 Estrogen receptor negative status [ER-]: Secondary | ICD-10-CM | POA: Diagnosis not present

## 2017-02-11 DIAGNOSIS — I7300 Raynaud's syndrome without gangrene: Secondary | ICD-10-CM | POA: Diagnosis not present

## 2017-02-11 LAB — IRON AND TIBC
IRON: 27 ug/dL — AB (ref 28–170)
SATURATION RATIOS: 16 % (ref 10.4–31.8)
TIBC: 171 ug/dL — AB (ref 250–450)
UIBC: 144 ug/dL

## 2017-02-11 LAB — CBC WITH DIFFERENTIAL/PLATELET
BASOS PCT: 0 %
Basophils Absolute: 0 10*3/uL (ref 0.0–0.1)
Eosinophils Absolute: 0.1 10*3/uL (ref 0.0–0.7)
Eosinophils Relative: 1 %
HEMATOCRIT: 33.4 % — AB (ref 36.0–46.0)
HEMOGLOBIN: 10.4 g/dL — AB (ref 12.0–15.0)
LYMPHS ABS: 1.3 10*3/uL (ref 0.7–4.0)
Lymphocytes Relative: 12 %
MCH: 28.7 pg (ref 26.0–34.0)
MCHC: 31.1 g/dL (ref 30.0–36.0)
MCV: 92 fL (ref 78.0–100.0)
MONO ABS: 0.9 10*3/uL (ref 0.1–1.0)
MONOS PCT: 8 %
NEUTROS ABS: 8.7 10*3/uL — AB (ref 1.7–7.7)
Neutrophils Relative %: 79 %
Platelets: 252 10*3/uL (ref 150–400)
RBC: 3.63 MIL/uL — ABNORMAL LOW (ref 3.87–5.11)
RDW: 14.1 % (ref 11.5–15.5)
WBC: 11.1 10*3/uL — ABNORMAL HIGH (ref 4.0–10.5)

## 2017-02-11 LAB — COMPREHENSIVE METABOLIC PANEL
ALK PHOS: 69 U/L (ref 38–126)
ALT: 10 U/L — ABNORMAL LOW (ref 14–54)
ANION GAP: 8 (ref 5–15)
AST: 18 U/L (ref 15–41)
Albumin: 2.5 g/dL — ABNORMAL LOW (ref 3.5–5.0)
BILIRUBIN TOTAL: 0.2 mg/dL — AB (ref 0.3–1.2)
BUN: 15 mg/dL (ref 6–20)
CALCIUM: 9 mg/dL (ref 8.9–10.3)
CO2: 28 mmol/L (ref 22–32)
Chloride: 96 mmol/L — ABNORMAL LOW (ref 101–111)
Creatinine, Ser: 0.9 mg/dL (ref 0.44–1.00)
GFR, EST NON AFRICAN AMERICAN: 55 mL/min — AB (ref >60)
Glucose, Bld: 359 mg/dL — ABNORMAL HIGH (ref 65–99)
POTASSIUM: 4.6 mmol/L (ref 3.5–5.1)
Sodium: 132 mmol/L — ABNORMAL LOW (ref 135–145)
TOTAL PROTEIN: 6 g/dL — AB (ref 6.5–8.1)

## 2017-02-11 LAB — FERRITIN: Ferritin: 85 ng/mL (ref 11–307)

## 2017-02-11 NOTE — Patient Instructions (Addendum)
[facility] at [facility] Discharge Instructions  RECOMMENDATIONS MADE BY THE CONSULTANT AND ANY TEST RESULTS WILL BE SENT TO YOUR REFERRING PHYSICIAN.  You were seen today by Dr. Louise Zhou Follow up in 6 months with lab work   Thank you for choosing [facility] at [facility] to provide your oncology and hematology care.  To afford each patient quality time with our provider, please arrive at least 15 minutes before your scheduled appointment time.    If you have a lab appointment with the Cancer Center please come in thru the  Main Entrance and check in at the main information desk  You need to re-schedule your appointment should you arrive 10 or more minutes late.  We strive to give you quality time with our providers, and arriving late affects you and other patients whose appointments are after yours.  Also, if you no show three or more times for appointments you may be dismissed from the clinic at the providers discretion.     Again, thank you for choosing [facility].  Our hope is that these requests will decrease the amount of time that you wait before being seen by our physicians.       _____________________________________________________________  Should you have questions after your visit to [facility], please contact our office at [phone] between the hours of 8:30 a.m. and 4:30 p.m.  Voicemails left after 4:30 p.m. will not be returned until the following business day.  For prescription refill requests, have your pharmacy contact our office.       Resources For Cancer Patients and their Caregivers ? American Cancer Society: Can assist with transportation, wigs, general needs, runs Look Good Feel Better.        1-888-227-6333 ? Cancer Care: Provides financial assistance, online support groups, medication/co-pay assistance.  1-800-813-HOPE (4673) ? [facility] Assists  [place] cancer patients and their families through emotional , educational and financial support.  336-427-4357 ? [place] DSS Where to apply for food stamps, Medicaid and utility assistance. 336-342-1394 ? [facility]: Transportation to medical appointments. 336-347-2287 ? Social Security Administration: May apply for disability if have a Stage IV cancer. 336-342-7796 1-800-772-1213 ? [place] Aging, Disability and Transit Services: Assists with nutrition, care and transit needs. 336-349-2343  Cancer Center Support Programs: @10RELATIVEDAYS@ > Cancer Support Group  2nd Tuesday of the month 1pm-2pm, Journey Room  > Creative Journey  3rd Tuesday of the month 1130am-1pm, Journey Room  > Look Good Feel Better  1st Wednesday of the month 10am-12 noon, Journey Room (Call American Cancer Society to register 1-800-395-5775)    

## 2017-02-11 NOTE — Progress Notes (Signed)
[facility] [place] [facility] [place] NOTE  Patient Care Team: Moshe Cipro, MD as PCP - General (Internal Medicine)  CHIEF COMPLAINTS/PURPOSE OF CONSULTATION:  Left Breast Cancer Biopsy on 03/20/2015 with ER- PR+(1% with strong staining intensity) HER2 neu- invasive ductal carcinoma pT2, pN0 Mastectomy on 05/09/2015  HISTORY OF PRESENTING ILLNESS:  [name] 83 y.o. female is here for follow-up of L breast cancer s/p mastectomy, she has ER-, PR+ disease. She is currently on Tamoxifen.   She has had a hysterectomy. [name] returns to the [facility] with several family members. She is confined to a wheelchair due to her significant kyphosis.  [name] is accompanied by two of her daughters. Patient presents in wheelchair. I personally reviewed and went over laboratory studies with the patient and her family.  Since our last visit, she was hospitalized twice with community acquired pneumonia.  She has not had any issues with the Tamoxifen. She continues to take this regularly.  She reports some hot flashes, but these do not wake her at night. The patient denies any vaginal bleeding or spotting. She denies chest pain, SOB, or abdominal pain. The patient is not ambulatory at home, and stays in her wheelchair most of the day. Denies lower extremity edema.  The patient reports she has not been following with routine yearly vision screenings recently.  MEDICAL HISTORY:  Past Medical History:  Diagnosis Date  . Acid reflux   . Anemia   . Arthritis    ra  . Asthma   . Cancer [facility])    breast cancer  . Coronary artery disease   . Diabetes mellitus without complication ([place])   . Diverticulitis   . Gastric ulcer   . Hypertension   . Hypoxemia   . Kyphosis   . Kyphosis   . Mitral valve disorder   . Osteoporosis   . Oxygen dependent    2 liters at night  . Raynaud disease   . Thrombocytopenia ([place])     SURGICAL HISTORY: Past Surgical History:    Procedure Laterality Date  . BREAST SURGERY    . HERNIA REPAIR    . MASTECTOMY Right 1978  . SIMPLE MASTECTOMY WITH AXILLARY SENTINEL NODE BIOPSY Left 05/09/2015   Procedure: LEFT SIMPLE MASTECTOMY;  Surgeon: Erroll Luna, MD;  Location: [place] OR;  Service: General;  Laterality: Left;    SOCIAL HISTORY: Social History   Social History  . Marital status: Widowed    Spouse name: N/A  . Number of children: N/A  . Years of education: N/A   Occupational History  . Not on file.   Social History Main Topics  . Smoking status: Never Smoker  . Smokeless tobacco: Never Used  . Alcohol use No  . Drug use: No  . Sexual activity: No   Other Topics Concern  . Not on file   Social History Narrative  . No narrative on file  4 daughters 8 grandchildren 13 great grandchildren Widowed Ex-cleaner Non smoker ETOH, none   FAMILY HISTORY: Family History  Problem Relation Age of Onset  . Asthma Sister   . Rheum arthritis Daughter    indicated that the status of her sister is unknown. She indicated that the status of her daughter is unknown.   Father deceased, 73 Mother deceased, young age 29 sisters deceased 2 half-sisters alive 19 brothers   ALLERGIES:  is allergic to aspirin; megace [megestrol]; and rocephin [ceftriaxone sodium in dextrose].  MEDICATIONS:  Current Outpatient Prescriptions  Medication Sig Dispense Refill  . aspirin EC 81 MG tablet Take 81 mg by mouth 2 (two) times daily.     Marland Kitchen atorvastatin (LIPITOR) 10 MG tablet Take 10 mg by mouth daily.    . budesonide-formoterol (SYMBICORT) 160-4.5 MCG/ACT inhaler Inhale 2 puffs into the lungs 2 (two) times daily.    . Cholecalciferol (VITAMIN D3) 1000 UNITS CAPS Take 2,000 Units by mouth daily.     . ciclopirox (PENLAC) 8 % solution APPLY TO FUNGAL TOENAILS EVERY DAY  1  . D-MANNOSE PO Take 2 capsules by mouth daily.    Marland Kitchen diltiazem (CARDIZEM CD) 120 MG 24 hr capsule Take 1 capsule (120 mg total) by mouth daily. 30  capsule 2  . fluconazole (DIFLUCAN) 50 MG tablet Take 1 tablet (50 mg total) by mouth daily. 7 tablet 0  . lactobacillus acidophilus & bulgar (LACTINEX) chewable tablet CHEW 1 TABLET BY MOUTH EVERY DAY  6  . levalbuterol (XOPENEX) 0.63 MG/3ML nebulizer solution Take 3 mLs (0.63 mg total) by nebulization 3 (three) times daily. 3 mL 12  . levofloxacin (LEVAQUIN) 750 MG tablet Take 1 tablet (750 mg total) by mouth every other day. 3 tablet 0  . linagliptin (TRADJENTA) 5 MG TABS tablet Take 5 mg by mouth every morning.    . magnesium oxide (MAG-OX) 400 (241.3 Mg) MG tablet Take 0.5 tablets (200 mg total) by mouth daily. (Patient taking differently: Take 200-400 mg by mouth daily. Alternating 200 mg one day then 400 mg the next.) 30 tablet 1  . Multiple Vitamins-Minerals (CENTRUM SILVER ADULT 50+ PO) Take 1 tablet by mouth every morning.     . pantoprazole (PROTONIX) 40 MG tablet Take 40 mg by mouth daily.    . Polysacchar Iron-FA-B12 (FERREX 150 FORTE) 150-1-25 MG-MG-MCG CAPS Take 1 capsule by mouth daily. 30 capsule 5  . Polyvinyl Alcohol-Povidone (CLEAR EYES ALL SEASONS) 5-6 MG/ML SOLN Apply 1 drop to eye every morning.     . predniSONE (DELTASONE) 5 MG tablet Take 2 tablets (10 mg total) by mouth daily with breakfast. 10 tablet 0  . raloxifene (EVISTA) 60 MG tablet Take 60 mg by mouth every morning.    . tamoxifen (NOLVADEX) 20 MG tablet TAKE 1 TABLET BY MOUTH EVERY DAY 30 tablet 3   No current facility-administered medications for this visit.     Review of Systems  Constitutional: Negative.   HENT: Negative.   Eyes: Negative.   Respiratory: Negative.   Cardiovascular: Negative.   Gastrointestinal: Negative.   Genitourinary: Negative.   Musculoskeletal: Negative.   Skin: Negative.   Neurological: Negative.   Endo/Heme/Allergies: Negative.   Psychiatric/Behavioral: Negative.   All other systems reviewed and are negative.    PHYSICAL EXAMINATION: ECOG PERFORMANCE STATUS: 2 -  Symptomatic, <50% confined to bed  Vitals with BMI 02/11/2017  Height   Weight 106 lbs  BMI   Systolic 825  Diastolic 60  Pulse 053  Respirations 18    Physical Exam  Constitutional: She is oriented to person, place, and time. She appears well-developed and well-nourished.  Severe kyphosis, sitting in wheelchair.  Cardiovascular: Normal rate, regular rhythm and normal heart sounds.  Exam reveals no gallop and no friction rub.   No murmur heard. Pulmonary/Chest: Effort normal and breath sounds normal. No respiratory distress. She has no wheezes. She has no rales. She exhibits no tenderness. Right breast exhibits no mass, no skin change and no tenderness. Left breast exhibits no mass, no skin change and no tenderness.  Abdominal: Soft. Bowel sounds are normal. She exhibits no distension and no mass. There is no tenderness. There is no rebound and no guarding.  Genitourinary: No breast tenderness.  Musculoskeletal: She exhibits no edema.  Severe kyphosis  Neurological: She is alert and oriented to person, place, and time.  Skin: Skin is warm and dry. No erythema.  Psychiatric: She has a normal mood and affect. Her behavior is normal. Judgment and thought content normal.    Bilateral mast no subcut nodules no ax adenopathy  LABORATORY DATA:  I have reviewed the data as listed  Results for SHAKARA, TWEEDY (MRN [id number]) as of 02/11/2017 14:34  Ref. Range 02/11/2017 13:47  COMPREHENSIVE METABOLIC PANEL Unknown Rpt (A)  Sodium Latest Ref Range: 135 - 145 mmol/L 132 (L)  Potassium Latest Ref Range: 3.5 - 5.1 mmol/L 4.6  Chloride Latest Ref Range: 101 - 111 mmol/L 96 (L)  CO2 Latest Ref Range: 22 - 32 mmol/L 28  Glucose Latest Ref Range: 65 - 99 mg/dL 359 (H)  BUN Latest Ref Range: 6 - 20 mg/dL 15  Creatinine Latest Ref Range: 0.44 - 1.00 mg/dL 0.90  Calcium Latest Ref Range: 8.9 - 10.3 mg/dL 9.0  Anion gap Latest Ref Range: 5 - 15  8  Alkaline Phosphatase Latest Ref Range: 38 - 126  U/L 69  Albumin Latest Ref Range: 3.5 - 5.0 g/dL 2.5 (L)  AST Latest Ref Range: 15 - 41 U/L 18  ALT Latest Ref Range: 14 - 54 U/L 10 (L)  Total Protein Latest Ref Range: 6.5 - 8.1 g/dL 6.0 (L)  Total Bilirubin Latest Ref Range: 0.3 - 1.2 mg/dL 0.2 (L)  EGFR (African American) Latest Ref Range: >60 mL/min >60  EGFR (Non-African Amer.) Latest Ref Range: >60 mL/min 55 (L)  WBC Latest Ref Range: 4.0 - 10.5 K/uL 11.1 (H)  RBC Latest Ref Range: 3.87 - 5.11 MIL/uL 3.63 (L)  Hemoglobin Latest Ref Range: 12.0 - 15.0 g/dL 10.4 (L)  HCT Latest Ref Range: 36.0 - 46.0 % 33.4 (L)  MCV Latest Ref Range: 78.0 - 100.0 fL 92.0  MCH Latest Ref Range: 26.0 - 34.0 pg 28.7  MCHC Latest Ref Range: 30.0 - 36.0 g/dL 31.1  RDW Latest Ref Range: 11.5 - 15.5 % 14.1  Platelets Latest Ref Range: 150 - 400 K/uL 252  Neutrophils Latest Units: % 79  Lymphocytes Latest Units: % 12  Monocytes Relative Latest Units: % 8  Eosinophil Latest Units: % 1  Basophil Latest Units: % 0  NEUT# Latest Ref Range: 1.7 - 7.7 K/uL 8.7 (H)  Lymphocyte # Latest Ref Range: 0.7 - 4.0 K/uL 1.3  Monocyte # Latest Ref Range: 0.1 - 1.0 K/uL 0.9  Eosinophils Absolute Latest Ref Range: 0.0 - 0.7 K/uL 0.1  Basophils Absolute Latest Ref Range: 0.0 - 0.1 K/uL 0.0  NAL DIAGNOSIS Diagnosis 1. Breast, simple mastectomy, Left - INVASIVE GRADE III DUCTAL CARCINOMA, SPANNING 4.8 CM IN GREATEST DIMENSION. - MARGINS ARE NEGATIVE. - SEE ONCOLOGY TEMPLATE. 2. Lymph nodes, regional resection, Left axillary contents - FIVE BENIGN LYMPH NODES WITH NO TUMOR SEEN (0/5).   BREAST, INVASIVE TUMOR, WITH LYMPH NODES PRESENT Specimen, including laterality and lymph node sampling (sentinel, non-sentinel): Left breast with left axillary contents. Procedure: Left mastectomy with left axillary excision. Histologic type: Invasive ductal carcinoma. Grade: 3. Tubule formation: 3. Nuclear pleomorphism: 2. Mitotic: 3. Tumor size (gross measurement): 4.8  cm. Margins: Invasive, distance to closest margin: 0.2 cm (deep margin). Lymphovascular invasion: Definitive lymph/vascular invasion  is not identified on the current specimen; however, lymph/vascular invasion was reported on the previous needle core biopsy ([id number]). Ductal carcinoma in situ: Not identified. Lobular neoplasia: Not identified. Tumor focality: Unifocal. Treatment effect: Not applicable. Extent of tumor: Skin: Tumor directly extends into dermis. Nipple: Not involved. Skeletal muscle: Not received. Lymph nodes: Examined: 0 Sentinel. 5 Non-sentinel. 5 Total. Lymph nodes with metastasis: 0. Isolated tumor cells (< 0.2 mm): 0. 2 of 4 FINAL for [facility], [name] ([id number]) Microscopic Comment(continued) Micrometastasis: (> 0.2 mm and < 2.0 mm): 0. Macrometastasis: (> 2.0 mm): 0. Extracapsular extension: Not applicable. Breast prognostic profile: Performed on previous case [id number] Estrogen receptor: 0%, negative. Progesterone receptor: 1%, positive. Her 2 neu: 1.54 ratio, negative. Ki-67: 30%. Non-neoplastic breast: Unremarkable. TNM: pT2, pN0. Comments: As both Her-2 neu and estrogen receptor were previously reported as negative, a quantitative estrogen receptor and a Her-2 neu will be repeated on representative tumor to be reported in an addendum to follow. (RH:kh 05-11-15) Willeen Niece MD Pathologist, Electronic Signature (Case signed 05/11/2015) Specimen Gross and Clinical Information Breast prognostic profile: Performed on previous case [id number] Estrogen receptor: 0%, negative. Progesterone receptor: 1%, positive. Her 2 neu: 1.54 ratio, negative. Ki-67: 30%. Non-neoplastic breast: Unremarkable. TNM: pT2, pN0. Comments: As both Her-2 neu and estrogen receptor were previously reported as negative, a quantitative estrogen receptor and a Her-2 neu will be repeated on representative tumor to be reported in an addendum to follow.  (RH:kh 05-11-15) Willeen Niece MD Pathologist, Electronic Signature (Case signed 05/11/2015)    ASSESSMENT & PLAN:  ER- PR+ HER 2 neu - carcinoma of the left breast pT2 pN0 Mx Mastectomy Significant Kyphosis Marginal PS secondary to rheumtoid arthritis, kyphosis Tamoxifen with excellent tolerance Hysterectomy Anemia  PLAN: - Doing well clinically with no evidence of disease recurrence. - Continue Tamoxifen.  - RTC in 6 months for follow up. - Patient will continue following with her PCP and routine vision screenings as indicated.  All questions were answered. The patient knows to call the clinic with any problems, questions or concerns.   This document serves as a record of services personally performed by Twana First, MD. It was created on her behalf by Maryla Morrow, a trained medical scribe. The creation of this record is based on the scribe's personal observations and the provider's statements to them. This document has been checked and approved by the attending provider.  I have reviewed the above documentation for accuracy and completeness, and I agree with the above.  This note was electronically signed by:  Twana First, MD 02/11/17

## 2017-03-03 ENCOUNTER — Inpatient Hospital Stay (HOSPITAL_COMMUNITY)
Admission: EM | Admit: 2017-03-03 | Discharge: 2017-03-07 | DRG: 637 | Disposition: A | Discharge status: Home / Self Care | Payer: Medicare Other | Attending: Internal Medicine | Admitting: Internal Medicine

## 2017-03-03 ENCOUNTER — Encounter (HOSPITAL_COMMUNITY): Payer: Self-pay | Admitting: Emergency Medicine

## 2017-03-03 DIAGNOSIS — Z7982 Long term (current) use of aspirin: Secondary | ICD-10-CM

## 2017-03-03 DIAGNOSIS — Z7984 Long term (current) use of oral hypoglycemic drugs: Secondary | ICD-10-CM

## 2017-03-03 DIAGNOSIS — E1121 Type 2 diabetes mellitus with diabetic nephropathy: Secondary | ICD-10-CM | POA: Diagnosis not present

## 2017-03-03 DIAGNOSIS — Z8711 Personal history of peptic ulcer disease: Secondary | ICD-10-CM

## 2017-03-03 DIAGNOSIS — J189 Pneumonia, unspecified organism: Secondary | ICD-10-CM | POA: Diagnosis present

## 2017-03-03 DIAGNOSIS — J45909 Unspecified asthma, uncomplicated: Secondary | ICD-10-CM | POA: Diagnosis present

## 2017-03-03 DIAGNOSIS — E1165 Type 2 diabetes mellitus with hyperglycemia: Principal | ICD-10-CM | POA: Diagnosis present

## 2017-03-03 DIAGNOSIS — R532 Functional quadriplegia: Secondary | ICD-10-CM | POA: Diagnosis present

## 2017-03-03 DIAGNOSIS — E878 Other disorders of electrolyte and fluid balance, not elsewhere classified: Secondary | ICD-10-CM | POA: Diagnosis present

## 2017-03-03 DIAGNOSIS — C50412 Malignant neoplasm of upper-outer quadrant of left female breast: Secondary | ICD-10-CM | POA: Diagnosis not present

## 2017-03-03 DIAGNOSIS — E119 Type 2 diabetes mellitus without complications: Secondary | ICD-10-CM

## 2017-03-03 DIAGNOSIS — Z888 Allergy status to other drugs, medicaments and biological substances status: Secondary | ICD-10-CM

## 2017-03-03 DIAGNOSIS — K219 Gastro-esophageal reflux disease without esophagitis: Secondary | ICD-10-CM | POA: Diagnosis present

## 2017-03-03 DIAGNOSIS — I10 Essential (primary) hypertension: Secondary | ICD-10-CM | POA: Diagnosis present

## 2017-03-03 DIAGNOSIS — D638 Anemia in other chronic diseases classified elsewhere: Secondary | ICD-10-CM | POA: Diagnosis present

## 2017-03-03 DIAGNOSIS — E860 Dehydration: Secondary | ICD-10-CM | POA: Diagnosis present

## 2017-03-03 DIAGNOSIS — I2510 Atherosclerotic heart disease of native coronary artery without angina pectoris: Secondary | ICD-10-CM | POA: Diagnosis present

## 2017-03-03 DIAGNOSIS — Z853 Personal history of malignant neoplasm of breast: Secondary | ICD-10-CM

## 2017-03-03 DIAGNOSIS — D62 Acute posthemorrhagic anemia: Secondary | ICD-10-CM | POA: Diagnosis present

## 2017-03-03 DIAGNOSIS — Z7401 Bed confinement status: Secondary | ICD-10-CM | POA: Diagnosis not present

## 2017-03-03 DIAGNOSIS — Z886 Allergy status to analgesic agent status: Secondary | ICD-10-CM

## 2017-03-03 DIAGNOSIS — R739 Hyperglycemia, unspecified: Secondary | ICD-10-CM | POA: Diagnosis present

## 2017-03-03 DIAGNOSIS — M069 Rheumatoid arthritis, unspecified: Secondary | ICD-10-CM | POA: Diagnosis present

## 2017-03-03 DIAGNOSIS — Z9981 Dependence on supplemental oxygen: Secondary | ICD-10-CM

## 2017-03-03 DIAGNOSIS — N390 Urinary tract infection, site not specified: Secondary | ICD-10-CM | POA: Diagnosis present

## 2017-03-03 DIAGNOSIS — I059 Rheumatic mitral valve disease, unspecified: Secondary | ICD-10-CM | POA: Diagnosis present

## 2017-03-03 DIAGNOSIS — I7300 Raynaud's syndrome without gangrene: Secondary | ICD-10-CM | POA: Diagnosis present

## 2017-03-03 DIAGNOSIS — M810 Age-related osteoporosis without current pathological fracture: Secondary | ICD-10-CM | POA: Diagnosis present

## 2017-03-03 DIAGNOSIS — I471 Supraventricular tachycardia, unspecified: Secondary | ICD-10-CM

## 2017-03-03 DIAGNOSIS — M40209 Unspecified kyphosis, site unspecified: Secondary | ICD-10-CM | POA: Diagnosis present

## 2017-03-03 DIAGNOSIS — Z7952 Long term (current) use of systemic steroids: Secondary | ICD-10-CM | POA: Diagnosis not present

## 2017-03-03 DIAGNOSIS — Z7951 Long term (current) use of inhaled steroids: Secondary | ICD-10-CM

## 2017-03-03 DIAGNOSIS — R627 Adult failure to thrive: Secondary | ICD-10-CM | POA: Diagnosis present

## 2017-03-03 DIAGNOSIS — D72829 Elevated white blood cell count, unspecified: Secondary | ICD-10-CM

## 2017-03-03 DIAGNOSIS — Y95 Nosocomial condition: Secondary | ICD-10-CM | POA: Diagnosis present

## 2017-03-03 DIAGNOSIS — Z881 Allergy status to other antibiotic agents status: Secondary | ICD-10-CM

## 2017-03-03 DIAGNOSIS — Z170 Estrogen receptor positive status [ER+]: Secondary | ICD-10-CM | POA: Diagnosis not present

## 2017-03-03 DIAGNOSIS — Z79899 Other long term (current) drug therapy: Secondary | ICD-10-CM

## 2017-03-03 DIAGNOSIS — M05722 Rheumatoid arthritis with rheumatoid factor of left elbow without organ or systems involvement: Secondary | ICD-10-CM | POA: Diagnosis not present

## 2017-03-03 LAB — BASIC METABOLIC PANEL
Anion gap: 7 (ref 5–15)
BUN: 19 mg/dL (ref 6–20)
CO2: 18 mmol/L — ABNORMAL LOW (ref 22–32)
Calcium: 5.4 mg/dL — CL (ref 8.9–10.3)
Chloride: 119 mmol/L — ABNORMAL HIGH (ref 101–111)
Creatinine, Ser: 0.73 mg/dL (ref 0.44–1.00)
GFR calc Af Amer: >60 mL/min (ref >60)
GFR calc non Af Amer: >60 mL/min (ref >60)
Glucose, Bld: 335 mg/dL — ABNORMAL HIGH (ref 65–99)
Potassium: 2.7 mmol/L — CL (ref 3.5–5.1)
Sodium: 144 mmol/L (ref 135–145)

## 2017-03-03 LAB — CBC WITH DIFFERENTIAL/PLATELET
Basophils Absolute: 0 10*3/uL (ref 0.0–0.1)
Basophils Relative: 0 %
Eosinophils Absolute: 0 10*3/uL (ref 0.0–0.7)
Eosinophils Relative: 0 %
HCT: 26.8 % — ABNORMAL LOW (ref 36.0–46.0)
Hemoglobin: 8 g/dL — ABNORMAL LOW (ref 12.0–15.0)
Lymphocytes Relative: 10 %
Lymphs Abs: 1.4 10*3/uL (ref 0.7–4.0)
MCH: 28.2 pg (ref 26.0–34.0)
MCHC: 29.9 g/dL — ABNORMAL LOW (ref 30.0–36.0)
MCV: 94.4 fL (ref 78.0–100.0)
Monocytes Absolute: 0.5 10*3/uL (ref 0.1–1.0)
Monocytes Relative: 3 %
Neutro Abs: 12 10*3/uL — ABNORMAL HIGH (ref 1.7–7.7)
Neutrophils Relative %: 87 %
Platelets: 213 10*3/uL (ref 150–400)
RBC: 2.84 MIL/uL — ABNORMAL LOW (ref 3.87–5.11)
RDW: 13.6 % (ref 11.5–15.5)
WBC: 14 10*3/uL — ABNORMAL HIGH (ref 4.0–10.5)

## 2017-03-03 LAB — CBG MONITORING, ED
GLUCOSE-CAPILLARY: 358 mg/dL — AB (ref 65–99)
Glucose-Capillary: 480 mg/dL — ABNORMAL HIGH (ref 65–99)
Glucose-Capillary: 404 mg/dL — ABNORMAL HIGH (ref 65–99)

## 2017-03-03 LAB — URINALYSIS, ROUTINE W REFLEX MICROSCOPIC
Bacteria, UA: NONE SEEN
Bilirubin Urine: NEGATIVE
Glucose, UA: >=500 mg/dL — AB
Ketones, ur: 5 mg/dL — AB
Nitrite: NEGATIVE
Protein, ur: 30 mg/dL — AB
Specific Gravity, Urine: 1.023 (ref 1.005–1.030)
pH: 6 (ref 5.0–8.0)

## 2017-03-03 LAB — MAGNESIUM: Magnesium: 1.3 mg/dL — ABNORMAL LOW (ref 1.7–2.4)

## 2017-03-03 LAB — MRSA PCR SCREENING: MRSA BY PCR: NEGATIVE

## 2017-03-03 LAB — GLUCOSE, CAPILLARY: GLUCOSE-CAPILLARY: 242 mg/dL — AB (ref 65–99)

## 2017-03-03 MED ORDER — MAGNESIUM SULFATE 2 GM/50ML IV SOLN
2.0000 g | Freq: Once | INTRAVENOUS | Status: AC
  Administered 2017-03-03: 2 g via INTRAVENOUS
  Filled 2017-03-03: qty 50

## 2017-03-03 MED ORDER — MOMETASONE FURO-FORMOTEROL FUM 200-5 MCG/ACT IN AERO
2.0000 | INHALATION_SPRAY | Freq: Two times a day (BID) | RESPIRATORY_TRACT | Status: DC
  Administered 2017-03-04 – 2017-03-07 (×6): 2 via RESPIRATORY_TRACT
  Filled 2017-03-03: qty 8.8

## 2017-03-03 MED ORDER — POTASSIUM CHLORIDE 10 MEQ/100ML IV SOLN
10.0000 meq | INTRAVENOUS | Status: AC
  Administered 2017-03-03 (×2): 10 meq via INTRAVENOUS
  Filled 2017-03-03: qty 100

## 2017-03-03 MED ORDER — POTASSIUM CHLORIDE 20 MEQ/15ML (10%) PO SOLN
40.0000 meq | Freq: Once | ORAL | Status: AC
  Administered 2017-03-03: 40 meq via ORAL
  Filled 2017-03-03: qty 30

## 2017-03-03 MED ORDER — PREDNISONE 10 MG PO TABS
10.0000 mg | ORAL_TABLET | Freq: Every day | ORAL | Status: DC
  Administered 2017-03-04 – 2017-03-07 (×4): 10 mg via ORAL
  Filled 2017-03-03 (×4): qty 1

## 2017-03-03 MED ORDER — RALOXIFENE HCL 60 MG PO TABS
60.0000 mg | ORAL_TABLET | Freq: Every morning | ORAL | Status: DC
  Administered 2017-03-03 – 2017-03-07 (×5): 60 mg via ORAL
  Filled 2017-03-03 (×6): qty 1

## 2017-03-03 MED ORDER — METOPROLOL SUCCINATE ER 25 MG PO TB24
25.0000 mg | ORAL_TABLET | Freq: Every day | ORAL | Status: DC
  Administered 2017-03-03 – 2017-03-07 (×5): 25 mg via ORAL
  Filled 2017-03-03 (×5): qty 1

## 2017-03-03 MED ORDER — SODIUM CHLORIDE 0.9 % IV BOLUS (SEPSIS)
1000.0000 mL | Freq: Once | INTRAVENOUS | Status: AC
  Administered 2017-03-03: 1000 mL via INTRAVENOUS

## 2017-03-03 MED ORDER — DEXTROSE 5 % IV SOLN
INTRAVENOUS | Status: AC
  Filled 2017-03-03 (×2): qty 1

## 2017-03-03 MED ORDER — DEXTROSE-NACL 5-0.45 % IV SOLN
INTRAVENOUS | Status: DC
  Administered 2017-03-03: 16:00:00 via INTRAVENOUS

## 2017-03-03 MED ORDER — SODIUM CHLORIDE 0.9 % IV SOLN: INTRAVENOUS | Status: DC

## 2017-03-03 MED ORDER — ATORVASTATIN CALCIUM 10 MG PO TABS
10.0000 mg | ORAL_TABLET | Freq: Every day | ORAL | Status: DC
  Administered 2017-03-03 – 2017-03-06 (×5): 10 mg via ORAL
  Filled 2017-03-03 (×5): qty 1

## 2017-03-03 MED ORDER — INSULIN ASPART 100 UNIT/ML ~~LOC~~ SOLN
0.0000 [IU] | Freq: Three times a day (TID) | SUBCUTANEOUS | Status: DC
  Administered 2017-03-04: 3 [IU] via SUBCUTANEOUS
  Administered 2017-03-04: 5 [IU] via SUBCUTANEOUS
  Administered 2017-03-04: 7 [IU] via SUBCUTANEOUS
  Administered 2017-03-05: 5 [IU] via SUBCUTANEOUS
  Administered 2017-03-05: 3 [IU] via SUBCUTANEOUS

## 2017-03-03 MED ORDER — DILTIAZEM HCL ER COATED BEADS 120 MG PO CP24
120.0000 mg | ORAL_CAPSULE | Freq: Every day | ORAL | Status: DC
  Administered 2017-03-03 – 2017-03-07 (×5): 120 mg via ORAL
  Filled 2017-03-03 (×5): qty 1

## 2017-03-03 MED ORDER — ADENOSINE 6 MG/2ML IV SOLN
INTRAVENOUS | Status: AC
  Filled 2017-03-03: qty 6

## 2017-03-03 MED ORDER — ONDANSETRON HCL 4 MG PO TABS: 4.0000 mg | ORAL_TABLET | Freq: Four times a day (QID) | ORAL | Status: DC | PRN

## 2017-03-03 MED ORDER — AZTREONAM 1 G IJ SOLR
1.0000 g | Freq: Three times a day (TID) | INTRAMUSCULAR | Status: DC
  Administered 2017-03-03 – 2017-03-05 (×5): 1 g via INTRAVENOUS
  Filled 2017-03-03 (×9): qty 1

## 2017-03-03 MED ORDER — POTASSIUM CHLORIDE IN NACL 20-0.45 MEQ/L-% IV SOLN
INTRAVENOUS | Status: DC
Start: 2017-03-03 — End: 2017-03-04
  Administered 2017-03-03 – 2017-03-04 (×2): via INTRAVENOUS
  Filled 2017-03-03 (×2): qty 1000

## 2017-03-03 MED ORDER — SODIUM CHLORIDE 0.9 % IV SOLN
INTRAVENOUS | Status: DC
  Administered 2017-03-03: 3.4 [IU]/h via INTRAVENOUS
  Filled 2017-03-03: qty 1

## 2017-03-03 MED ORDER — AZTREONAM 1 G IJ SOLR: 1.0000 g | Freq: Three times a day (TID) | INTRAMUSCULAR | Status: DC

## 2017-03-03 MED ORDER — PANTOPRAZOLE SODIUM 40 MG PO TBEC
40.0000 mg | DELAYED_RELEASE_TABLET | Freq: Every day | ORAL | Status: DC
  Administered 2017-03-05 – 2017-03-07 (×3): 40 mg via ORAL
  Filled 2017-03-03 (×5): qty 1

## 2017-03-03 MED ORDER — ADENOSINE 6 MG/2ML IV SOLN
3.0000 mg | Freq: Once | INTRAVENOUS | Status: AC
  Administered 2017-03-03: 3 mg via INTRAVENOUS

## 2017-03-03 MED ORDER — SODIUM CHLORIDE 0.9 % IV BOLUS (SEPSIS): 1000.0000 mL | Freq: Once | INTRAVENOUS | Status: DC

## 2017-03-03 MED ORDER — ONDANSETRON HCL 4 MG/2ML IJ SOLN: 4.0000 mg | Freq: Four times a day (QID) | INTRAMUSCULAR | Status: DC | PRN

## 2017-03-03 MED ORDER — POLYETHYLENE GLYCOL 3350 17 G PO PACK
17.0000 g | PACK | Freq: Every day | ORAL | Status: DC | PRN
  Administered 2017-03-05: 17 g via ORAL
  Filled 2017-03-03: qty 1

## 2017-03-03 MED ORDER — SODIUM CHLORIDE 0.9 % IV SOLN
1.0000 g | Freq: Once | INTRAVENOUS | Status: AC
  Administered 2017-03-03: 1 g via INTRAVENOUS
  Filled 2017-03-03: qty 10

## 2017-03-03 MED ORDER — VITAMIN D 1000 UNITS PO TABS
2000.0000 [IU] | ORAL_TABLET | Freq: Every day | ORAL | Status: DC
  Administered 2017-03-03 – 2017-03-07 (×5): 2000 [IU] via ORAL
  Filled 2017-03-03 (×6): qty 2

## 2017-03-03 MED ORDER — ADULT MULTIVITAMIN W/MINERALS CH
1.0000 | ORAL_TABLET | Freq: Every morning | ORAL | Status: DC
  Administered 2017-03-03 – 2017-03-07 (×4): 1 via ORAL
  Filled 2017-03-03 (×5): qty 1

## 2017-03-03 MED ORDER — MAGNESIUM OXIDE 400 (241.3 MG) MG PO TABS
200.0000 mg | ORAL_TABLET | Freq: Every day | ORAL | Status: DC
  Administered 2017-03-03 – 2017-03-07 (×5): 200 mg via ORAL
  Filled 2017-03-03 (×5): qty 1

## 2017-03-03 NOTE — ED Triage Notes (Signed)
Daughter called pcp today and stated bs was 477. Was told to bring to ED to get fluids and sugar down. Pt alert/oriented to some. Denies pain. Nad.

## 2017-03-03 NOTE — H&P (Signed)
History and Physical    [name] ZHG:[id number] DOB: 1926-03-01 DOA: 03/03/2017  PCP: Moshe Cipro, MD (Confirm with patient/family/NH records and if not entered, this has to be entered at [facility] point of entry) Patient coming from: Home  Chief Complaint: Weakness, hyperglycemia.   HPI: [name] is a 83 y.o. female with medical history significant of Rheumatoid arthritis, DM2, history of breast cancer,  hypertension, asthma. Presented to the ED today with complaints of elevated blood sugar- 480. Called PCPs office with told him to come to the ED. Patient's 2 daughters are also present they have primary caregiver to provide most of the history- patient is awake appears lethargic but responds to most questions appropriately. Patient's blood sugar is checked once a week, due to adequate control. Last check was a week ago- was in the 100s. Patient's daughters also report weakness, increased sleepiness over the past 3-4 days, with poor by mouth intake. Patient has a chronic cough that is unchanged, but denies chest pain, shortness of breath, no fever. No diarrhea or vomiting abdominal pain. Denies dysuria, but endorses frequency. Patient is not ambulatory, and hasn't been for the past 2 years due to rheumatoid arthritis.   ED Course: Vitals initially stable, the patient goes into SVT, Which responded to one dose of adenosine 6 mg. CBG- 480, blood work reveals reveals- multiple electrolyte abnormalities Na- 144 elevated CBG, potassium low at 2.7, ca low- 5.4, bicarbonate mildly low at 18, magnesium low- 1.3, with normal AG- 7.  Review of Systems: As per HPI otherwise 10 point review of systems negative.   Past Medical History:  Diagnosis Date  . Acid reflux   . Anemia   . Arthritis    ra  . Asthma   . Cancer [facility])    breast cancer  . Coronary artery disease   . Diabetes mellitus without complication ([place])   . Diverticulitis   . Gastric ulcer   . Hypertension   . Hypoxemia   .  Kyphosis   . Kyphosis   . Mitral valve disorder   . Osteoporosis   . Oxygen dependent    2 liters at night  . Raynaud disease   . Thrombocytopenia ([place])     Past Surgical History:  Procedure Laterality Date  . BREAST SURGERY    . HERNIA REPAIR    . MASTECTOMY Right 1978  . SIMPLE MASTECTOMY WITH AXILLARY SENTINEL NODE BIOPSY Left 05/09/2015   Procedure: LEFT SIMPLE MASTECTOMY;  Surgeon: Erroll Luna, MD;  Location: [place];  Service: General;  Laterality: Left;     reports that she has never smoked. She has never used smokeless tobacco. She reports that she does not drink alcohol or use drugs.  Allergies  Allergen Reactions  . Aspirin Other (See Comments)    Cannot take uncoated due to stomach ulcers  . Megace [Megestrol]     abd pain   . Rocephin [Ceftriaxone Sodium In Dextrose] Swelling    Family History  Problem Relation Age of Onset  . Asthma Sister   . Rheum arthritis Daughter     Prior to Admission medications   Medication Sig Start Date End Date Taking? Authorizing Provider  aspirin EC 81 MG tablet Take 81 mg by mouth 2 (two) times daily.    Yes [provider]  atorvastatin (LIPITOR) 10 MG tablet Take 10 mg by mouth daily.   Yes [provider]  budesonide-formoterol (SYMBICORT) 160-4.5 MCG/ACT inhaler Inhale 2 puffs into the lungs 2 (two) times daily.  Yes [provider]  Cholecalciferol (VITAMIN D3) 1000 UNITS CAPS Take 2,000 Units by mouth daily.    Yes [provider]  ciclopirox (PENLAC) 8 % solution APPLY TO FUNGAL TOENAILS EVERY DAY 02/11/16  Yes [provider]  D-MANNOSE PO Take 2 capsules by mouth daily.   Yes [provider]  diltiazem (CARDIZEM CD) 120 MG 24 hr capsule Take 1 capsule (120 mg total) by mouth daily. 01/20/17  Yes Orvan Falconer, MD  lactobacillus acidophilus & bulgar (LACTINEX) chewable tablet CHEW 1 TABLET BY MOUTH EVERY DAY 09/17/15  Yes [provider]  linagliptin (TRADJENTA) 5  MG TABS tablet Take 5 mg by mouth every morning.   Yes [provider]  magnesium oxide (MAG-OX) 400 (241.3 Mg) MG tablet Take 0.5 tablets (200 mg total) by mouth daily. Patient taking differently: Take 200-400 mg by mouth daily. Alternating 200 mg one day then 400 mg the next. 04/15/16  Yes Reyne Dumas, MD  Multiple Vitamins-Minerals (CENTRUM SILVER ADULT 50+ PO) Take 1 tablet by mouth every morning.    Yes [provider]  pantoprazole (PROTONIX) 40 MG tablet Take 40 mg by mouth daily.   Yes [provider]  Polysacchar Iron-FA-B12 (FERREX 150 FORTE) 150-1-25 MG-MG-MCG CAPS Take 1 capsule by mouth daily. 09/23/16  Yes Kefalas, Manon Hilding, PA-C  Polyvinyl Alcohol-Povidone (CLEAR EYES ALL SEASONS) 5-6 MG/ML SOLN Apply 1 drop to eye every morning.    Yes [provider]  predniSONE (DELTASONE) 5 MG tablet Take 2 tablets (10 mg total) by mouth daily with breakfast. 01/20/17  Yes Orvan Falconer, MD  raloxifene (EVISTA) 60 MG tablet Take 60 mg by mouth every morning.   Yes [provider]  tamoxifen (NOLVADEX) 20 MG tablet TAKE 1 TABLET BY MOUTH EVERY DAY 12/24/16  Yes Kefalas, Manon Hilding, PA-C  metoprolol succinate (TOPROL-XL) 25 MG 24 hr tablet Take 1 tablet by mouth daily. 03/01/17   [provider]    Physical Exam: Vitals:   03/03/17 1445 03/03/17 1516 03/03/17 1530 03/03/17 1600  BP:  (!) 150/77 (!) 152/79 (!) 154/86  Pulse: (!) 117     Resp: (!) 23 20 (!) 24 (!) 25  Temp:      TempSrc:      SpO2: 96%     Weight:      Height:        Constitutional: NAD, calm, comfortable Vitals:   03/03/17 1445 03/03/17 1516 03/03/17 1530 03/03/17 1600  BP:  (!) 150/77 (!) 152/79 (!) 154/86  Pulse: (!) 117     Resp: (!) 23 20 (!) 24 (!) 25  Temp:      TempSrc:      SpO2: 96%     Weight:      Height:       Eyes: PERRL, lids and conjunctivae normal, thin. ENMT: Mucous membranes are Dry. Posterior pharynx - examined due to poor patient  cooperation. Neck: normal, supple Respiratory: clear to auscultation bilaterally, no wheezing, no crackles. Normal respiratory effort. No accessory muscle use.  Cardiovascular: Tachycardic, regular rate and rhythm, no murmurs / rubs / gallops. No extremity edema. 2+ pedal pulses. No carotid bruits.  Abdomen: no tenderness, no masses palpated. No hepatosplenomegaly. Bowel sounds positive.  Musculoskeletal: no clubbing / cyanosis. Contractures of fingers bilateral hands , hyperflexible wrists. Normal muscle tone.  Skin: no rashes, No induration Neurologic: Poor patient cooperation with neuro exam, Sensation intact, DTR normal. Strength - 3/5 right upper extremity- daughters note this is  chronic and normal for patient 2/2 RA. lower extremity strength not examined due to poor patient cooperation Psychiatric: Alert and oriented to person.    Labs on Admission: I have personally reviewed following labs and imaging studies  CBC:  Recent Labs Lab 03/03/17 1308  WBC 14.0*  NEUTROABS 12.0*  HGB 8.0*  HCT 26.8*  MCV 94.4  PLT 976   Basic Metabolic Panel:  Recent Labs Lab 03/03/17 1308  NA 144  K 2.7*  CL 119*  CO2 18*  GLUCOSE 335*  BUN 19  CREATININE 0.73  CALCIUM 5.4*  MG 1.3*   CBG:  Recent Labs Lab 03/03/17 1156 03/03/17 1526  GLUCAP 480* 404*   Urine analysis:    Component Value Date/Time   COLORURINE YELLOW 03/03/2017 1158   APPEARANCEUR CLOUDY (A) 03/03/2017 1158   LABSPEC 1.023 03/03/2017 1158   PHURINE 6.0 03/03/2017 1158   GLUCOSEU >=500 (A) 03/03/2017 1158   HGBUR MODERATE (A) 03/03/2017 1158   BILIRUBINUR NEGATIVE 03/03/2017 1158   KETONESUR 5 (A) 03/03/2017 1158   PROTEINUR 30 (A) 03/03/2017 1158   UROBILINOGEN 0.2 06/30/2015 1317   NITRITE NEGATIVE 03/03/2017 1158   LEUKOCYTESUR LARGE (A) 03/03/2017 1158    Radiological Exams on Admission: No results found.  EKG: Independently reviewed.   Assessment/Plan Principal Problem:    Hyperglycemia Active Problems:   Rheumatoid arthritis (HCC)   HTN (hypertension)   Diabetes type 2, controlled ([place])   Breast cancer of upper-outer quadrant of left female breast (HCC)   Asthma, chronic   Electrolyte abnormality  Hyperglycemia in DM2- CBG- 480, no anion gap. Mild low CO2- 18- possibly early DKA. Patient has been compliant with metformin and linagliptin. Hyperglycemia possibly due to UTI.  - Started on insulin drip in ED, D/c CBG- now 150 - SSI- sensitive - Resume diet  Multiple Electrolyte abnormalities- corrected sodium - 150s, K- 2.7, Mag- 1.3, Ca- 5.4. Repleted in ED. Likely due to hyperglycemia, poor by mouth intake.  - IV fluids half-normal saline +20 of K at 125cc/hr  SVT- rates- 170s. Findings sinus tach Likely provoked by electrolyte abnormality.  Last admission 01/20/17- for pneumonia, complicated by several episodes of PSVT. Patient's beta blocker was changed to CCB, it appears patient's metoprolol was also continued. - Continue Cardizem 120 mg daily and Toprol-XL 25 mg daily - If sinus tach doesn't improve with hydration and consider increasing Cardizem or metoprolol.  UTI- large leukocytes, numerous white blood cells, no bacteria seen. WBC- 14, afebrile. - IV aztreonam ( ceftriaxone allergy noted) - Up urine cultures  Rheumatoid arthritis- with mild flexion contractures in upper extremity fingers. On chronic prednisone only 10mg  daily - Doubt need for stress dose steroids at this time, especially considering hyperglycemia. - Continue prednisone 10 mg daily  Hypertension- stable. Continue home Cardizem and metoprolol.  Acute on chronic Anemia- hemoglobin dropped from 10-8. Chronic anemia likely due to anemia of chronic disease- RA. - DVT ppx with SCDs - Avoid antiplatelets and heparin products for now - Trend  Breast cancer history- bilateral breast- left breast 2 years ago mastectomy only, right breast >30 yrs ago mastectomy only. She appears to be on  both raloxifene and tamoxifen - Will continue raloxifene for now, this will need clarification   DVT prophylaxis: SCDs Code Status: Full  Family Communication: Both Daughters is at bedside Disposition Plan: Home, patient is bedbound, daughters assuming full care of Patient. Consults called: None  Admission status: Inpt , tele  (inpatient / obs / tele / medical  floor / SDU)   Bethena Roys MD [facility] Pager [phone]  If 7PM-7AM, please contact night-coverage [email or web address] Password [facility]  03/03/2017, 4:17 PM

## 2017-03-03 NOTE — ED Notes (Signed)
EDp made aware of pt's HR in the 170's, ekg done

## 2017-03-03 NOTE — ED Provider Notes (Signed)
[facility] DEPT Provider Note   CSN: [id number] Arrival date & time: 03/03/17  1142  By signing my name below, I, Mayer Masker, attest that this documentation has been prepared under the direction and in the presence of Virgel Manifold, MD. Electronically Signed: Mayer Masker, Scribe. 03/03/17. 12:23 PM  History   Chief Complaint Chief Complaint  Patient presents with  . Hyperglycemia   The history is provided by a relative and the patient. No language interpreter was used.   HPI Comments: Unknown Flannigan is a 83 y.o. female with a PMHx of DM and asthma who presents to the Emergency Department complaining of rapid-onset hyperglycemia since this morning. Her family notes a sugar result of 480. She has associated thirst and drowsiness. Her family was instructed by her PCP to check her blood sugar 1x/week if the results are normal. Pt takes metformin for her DM and prednisone for asthma with no changes in other medications. She denies vomiting.   Past Medical History:  Diagnosis Date  . Acid reflux   . Anemia   . Arthritis    ra  . Asthma   . Cancer [facility])    breast cancer  . Coronary artery disease   . Diabetes mellitus without complication ([place])   . Diverticulitis   . Gastric ulcer   . Hypertension   . Hypoxemia   . Kyphosis   . Kyphosis   . Mitral valve disorder   . Osteoporosis   . Oxygen dependent    2 liters at night  . Raynaud disease   . Thrombocytopenia [facility])     Patient Active Problem List   Diagnosis Date Noted  . Pressure injury of skin 01/16/2017  . Dehydration   . Lactic acidosis 07/19/2016  . Asthma, chronic 04/12/2016  . Sepsis ([place]) 03/08/2016  . CAP (community acquired pneumonia) 10/09/2015  . Pressure ulcer 10/09/2015  . Kyphosis   . UTI (urinary tract infection) 06/30/2015  . Breast cancer of upper-outer quadrant of left female breast ([place]) 05/09/2015  . Rheumatoid arthritis ([place]) 10/29/2012  . HTN (hypertension) 10/29/2012  . Diabetes type  2, controlled ([place]) 10/29/2012  . GERD (gastroesophageal reflux disease) 10/29/2012    Past Surgical History:  Procedure Laterality Date  . BREAST SURGERY    . HERNIA REPAIR    . MASTECTOMY Right 1978  . SIMPLE MASTECTOMY WITH AXILLARY SENTINEL NODE BIOPSY Left 05/09/2015   Procedure: LEFT SIMPLE MASTECTOMY;  Surgeon: Erroll Luna, MD;  Location: [facility];  Service: General;  Laterality: Left;    OB History    Gravida Para Term Preterm AB Living   4 4 4     4    SAB TAB Ectopic Multiple Live Births                   Home Medications    Prior to Admission medications   Medication Sig Start Date End Date Taking? Authorizing Provider  aspirin EC 81 MG tablet Take 81 mg by mouth 2 (two) times daily.     [provider]  atorvastatin (LIPITOR) 10 MG tablet Take 10 mg by mouth daily.    [provider]  budesonide-formoterol (SYMBICORT) 160-4.5 MCG/ACT inhaler Inhale 2 puffs into the lungs 2 (two) times daily.    [provider]  Cholecalciferol (VITAMIN D3) 1000 UNITS CAPS Take 2,000 Units by mouth daily.     [provider]  ciclopirox (PENLAC) 8 % solution APPLY TO FUNGAL TOENAILS EVERY DAY 02/11/16   [provider]  D-MANNOSE PO Take 2 capsules by mouth daily.    [provider]  diltiazem (CARDIZEM CD) 120 MG 24 hr capsule Take 1 capsule (120 mg total) by mouth daily. 01/20/17   Orvan Falconer, MD  lactobacillus acidophilus & bulgar (LACTINEX) chewable tablet CHEW 1 TABLET BY MOUTH EVERY DAY 09/17/15   [provider]  linagliptin (TRADJENTA) 5 MG TABS tablet Take 5 mg by mouth every morning.    [provider]  magnesium oxide (MAG-OX) 400 (241.3 Mg) MG tablet Take 0.5 tablets (200 mg total) by mouth daily. Patient taking differently: Take 200-400 mg by mouth daily. Alternating 200 mg one day then 400 mg the next. 04/15/16   Reyne Dumas, MD  Multiple Vitamins-Minerals (CENTRUM SILVER ADULT 50+ PO) Take 1 tablet by  mouth every morning.     [provider]  pantoprazole (PROTONIX) 40 MG tablet Take 40 mg by mouth daily.    [provider]  Polysacchar Iron-FA-B12 (FERREX 150 FORTE) 150-1-25 MG-MG-MCG CAPS Take 1 capsule by mouth daily. 09/23/16   Baird Cancer, PA-C  Polyvinyl Alcohol-Povidone (CLEAR EYES ALL SEASONS) 5-6 MG/ML SOLN Apply 1 drop to eye every morning.     [provider]  predniSONE (DELTASONE) 5 MG tablet Take 2 tablets (10 mg total) by mouth daily with breakfast. 01/20/17   Orvan Falconer, MD  raloxifene (EVISTA) 60 MG tablet Take 60 mg by mouth every morning.    [provider]  tamoxifen (NOLVADEX) 20 MG tablet TAKE 1 TABLET BY MOUTH EVERY DAY 12/24/16   Baird Cancer, PA-C    Family History Family History  Problem Relation Age of Onset  . Asthma Sister   . Rheum arthritis Daughter     Social History Social History  Substance Use Topics  . Smoking status: Never Smoker  . Smokeless tobacco: Never Used  . Alcohol use No     Allergies   Aspirin; Megace [megestrol]; and Rocephin [ceftriaxone sodium in dextrose]   Review of Systems Review of Systems  Constitutional:       Positive for: thirst  Gastrointestinal: Negative for vomiting.  Endocrine:       Positive for: hyperglycemia  Neurological:       Positive for: drowsiness  All other systems reviewed and are negative.    Physical Exam Updated Vital Signs BP 116/64   Pulse (!) 120   Temp 98.1 F (36.7 C) (Oral)   Resp (!) 24   Ht 5\' 2"  (1.575 m)   Wt 106 lb (48.1 kg)   SpO2 100%   BMI 19.39 kg/m   Physical Exam  Constitutional: She is oriented to person, place, and time. She appears well-developed and well-nourished. No distress.  Frail and cachectic  Drowsy but opens eyes to voice  HENT:  Head: Normocephalic.  Eyes: Conjunctivae are normal. Pupils are equal, round, and reactive to light. No scleral icterus.  Neck: Normal range of motion. Neck supple. No thyromegaly  present.  Cardiovascular: Regular rhythm.  Exam reveals no gallop and no friction rub.   No murmur heard. tachycardic  Pulmonary/Chest: Effort normal and breath sounds normal. No respiratory distress. She has no wheezes. She has no rales.  Abdominal: Soft. Bowel sounds are normal. She exhibits no distension. There is no tenderness. There is no rebound.  Musculoskeletal: Normal range of motion.  Neurological: She is alert and oriented to person, place, and time.  Skin: Skin is warm and dry. No rash noted.  Psychiatric: She has  a normal mood and affect. Her behavior is normal.     ED Treatments / Results  DIAGNOSTIC STUDIES: Oxygen Saturation is 100% on RA, normal by my interpretation.    COORDINATION OF CARE: 12:13 PM Discussed treatment plan with pt at bedside and pt agreed to plan.  Labs (all labs ordered are listed, but only abnormal results are displayed) Labs Reviewed  URINE CULTURE - Abnormal; Notable for the following:       Result Value   Culture 60,000 COLONIES/mL YEAST (*)    All other components within normal limits  BASIC METABOLIC PANEL - Abnormal; Notable for the following:    Potassium 2.7 (*)    Chloride 119 (*)    CO2 18 (*)    Glucose, Bld 335 (*)    Calcium 5.4 (*)    All other components within normal limits  URINALYSIS, ROUTINE W REFLEX MICROSCOPIC - Abnormal; Notable for the following:    APPearance CLOUDY (*)    Glucose, UA >=500 (*)    Hgb urine dipstick MODERATE (*)    Ketones, ur 5 (*)    Protein, ur 30 (*)    Leukocytes, UA LARGE (*)    Squamous Epithelial / LPF 0-5 (*)    All other components within normal limits  CBC WITH DIFFERENTIAL/PLATELET - Abnormal; Notable for the following:    WBC 14.0 (*)    RBC 2.84 (*)    Hemoglobin 8.0 (*)    HCT 26.8 (*)    MCHC 29.9 (*)    Neutro Abs 12.0 (*)    All other components within normal limits  MAGNESIUM - Abnormal; Notable for the following:    Magnesium 1.3 (*)    All other components within  normal limits  HEMOGLOBIN A1C - Abnormal; Notable for the following:    Hgb A1c MFr Bld 9.9 (*)    All other components within normal limits  BASIC METABOLIC PANEL - Abnormal; Notable for the following:    Potassium 5.4 (*)    Glucose, Bld 262 (*)    BUN 22 (*)    Creatinine, Ser 1.06 (*)    Calcium 8.3 (*)    GFR calc non Af Amer 45 (*)    GFR calc Af Amer 53 (*)    All other components within normal limits  CBC - Abnormal; Notable for the following:    WBC 19.9 (*)    RBC 3.40 (*)    Hemoglobin 9.7 (*)    HCT 31.9 (*)    All other components within normal limits  GLUCOSE, CAPILLARY - Abnormal; Notable for the following:    Glucose-Capillary 242 (*)    All other components within normal limits  GLUCOSE, CAPILLARY - Abnormal; Notable for the following:    Glucose-Capillary 260 (*)    All other components within normal limits  GLUCOSE, CAPILLARY - Abnormal; Notable for the following:    Glucose-Capillary 169 (*)    All other components within normal limits  GLUCOSE, CAPILLARY - Abnormal; Notable for the following:    Glucose-Capillary 108 (*)    All other components within normal limits  GLUCOSE, CAPILLARY - Abnormal; Notable for the following:    Glucose-Capillary 201 (*)    All other components within normal limits  GLUCOSE, CAPILLARY - Abnormal; Notable for the following:    Glucose-Capillary 205 (*)    All other components within normal limits  GLUCOSE, CAPILLARY - Abnormal; Notable for the following:    Glucose-Capillary 325 (*)  All other components within normal limits  BASIC METABOLIC PANEL - Abnormal; Notable for the following:    CO2 21 (*)    Glucose, Bld 214 (*)    Calcium 7.4 (*)    All other components within normal limits  CBC - Abnormal; Notable for the following:    WBC 13.1 (*)    RBC 2.81 (*)    Hemoglobin 8.1 (*)    HCT 26.4 (*)    All other components within normal limits  GLUCOSE, CAPILLARY - Abnormal; Notable for the following:     Glucose-Capillary 322 (*)    All other components within normal limits  GLUCOSE, CAPILLARY - Abnormal; Notable for the following:    Glucose-Capillary 210 (*)    All other components within normal limits  GLUCOSE, CAPILLARY - Abnormal; Notable for the following:    Glucose-Capillary 283 (*)    All other components within normal limits  GLUCOSE, CAPILLARY - Abnormal; Notable for the following:    Glucose-Capillary 405 (*)    All other components within normal limits  CBC - Abnormal; Notable for the following:    WBC 11.5 (*)    RBC 3.21 (*)    Hemoglobin 9.1 (*)    HCT 29.5 (*)    All other components within normal limits  BASIC METABOLIC PANEL - Abnormal; Notable for the following:    Glucose, Bld 115 (*)    Calcium 7.8 (*)    All other components within normal limits  GLUCOSE, CAPILLARY - Abnormal; Notable for the following:    Glucose-Capillary 444 (*)    All other components within normal limits  GLUCOSE, CAPILLARY - Abnormal; Notable for the following:    Glucose-Capillary 342 (*)    All other components within normal limits  GLUCOSE, CAPILLARY - Abnormal; Notable for the following:    Glucose-Capillary 266 (*)    All other components within normal limits  GLUCOSE, CAPILLARY - Abnormal; Notable for the following:    Glucose-Capillary 140 (*)    All other components within normal limits  GLUCOSE, CAPILLARY - Abnormal; Notable for the following:    Glucose-Capillary 100 (*)    All other components within normal limits  GLUCOSE, CAPILLARY - Abnormal; Notable for the following:    Glucose-Capillary 246 (*)    All other components within normal limits  GLUCOSE, CAPILLARY - Abnormal; Notable for the following:    Glucose-Capillary 164 (*)    All other components within normal limits  GLUCOSE, CAPILLARY - Abnormal; Notable for the following:    Glucose-Capillary 207 (*)    All other components within normal limits  GLUCOSE, CAPILLARY - Abnormal; Notable for the following:      Glucose-Capillary 158 (*)    All other components within normal limits  CBG MONITORING, ED - Abnormal; Notable for the following:    Glucose-Capillary 480 (*)    All other components within normal limits  CBG MONITORING, ED - Abnormal; Notable for the following:    Glucose-Capillary 404 (*)    All other components within normal limits  CBG MONITORING, ED - Abnormal; Notable for the following:    Glucose-Capillary 358 (*)    All other components within normal limits  MRSA PCR SCREENING  MAGNESIUM  GLUCOSE, CAPILLARY  GLUCOSE, CAPILLARY    EKG  EKG Interpretation None       Radiology No results found.  Procedures Procedures (including critical care time)  CRITICAL CARE Performed by: Virgel Manifold Total critical care time: 40 minutes Critical care  time was exclusive of separately billable procedures and treating other patients. Critical care was necessary to treat or prevent imminent or life-threatening deterioration. Critical care was time spent personally by me on the following activities: development of treatment plan with patient and/or surrogate as well as nursing, discussions with consultants, evaluation of patient's response to treatment, examination of patient, obtaining history from patient or surrogate, ordering and performing treatments and interventions, ordering and review of laboratory studies, ordering and review of radiographic studies, pulse oximetry and re-evaluation of patient's condition.   Medications Ordered in ED Medications  insulin regular (NOVOLIN R,HUMULIN R) 100 Units in sodium chloride 0.9 % 100 mL (1 Units/mL) infusion (not administered)  dextrose 5 %-0.45 % sodium chloride infusion (not administered)  potassium chloride 10 mEq in 100 mL IVPB (not administered)  potassium chloride 20 MEQ/15ML (10%) solution 40 mEq (not administered)  0.9 %  sodium chloride infusion (not administered)  calcium gluconate 1 g in sodium chloride 0.9 % 100 mL  IVPB (not administered)  magnesium sulfate IVPB 2 g 50 mL (not administered)  adenosine (ADENOCARD) 6 MG/2ML injection 3 mg (not administered)  adenosine (ADENOCARD) 6 MG/2ML injection (not administered)  sodium chloride 0.9 % bolus 1,000 mL (1,000 mLs Intravenous New Bag/Given 03/03/17 1235)     Initial Impression / Assessment and Plan / ED Course  I have reviewed the triage vital signs and the nursing notes.  Pertinent labs & imaging results that were available during my care of the patient were reviewed by me and considered in my medical decision making (see chart for details).     89yF with hyperglycemia.   May be medication related. She is on prednisone chronically for RA. 5mg  daily. This was briefly increased during hospitalization last month and she was taking 10mg /day at the time of discharge. Family reports this was for only about a week so has been on her typical dosing of 5mg /daily for the past month or so.   It also appears that her metformin was discontinued at that time and [place] was started in its place.   She is afebrile. No complaints aside from feeling tired and thirsty. Denies any pain. No respiratory complaints.   2:21 PM Notified by nursing that HR 170. Will get 12-lead EKG. HR was elevated on my initial evaluation probably around 110-120. Pt's exam is otherwise unchanged. She doesn't appear distressed. BP is fine. Per recent discharge summary, she had several runs of PSVT during that hospitalization.   2:35 PM Converted to sinus tachycardia with 3mg  adenosine.   Final Clinical Impressions(s) / ED Diagnoses   Final diagnoses:  Hyperglycemia  SVT (supraventricular tachycardia) (HCC)    New Prescriptions New Prescriptions   No medications on file    I personally preformed the services scribed in my presence. The recorded information has been reviewed is accurate. Virgel Manifold, MD.     Virgel Manifold, MD 03/08/17 2007

## 2017-03-03 NOTE — ED Notes (Signed)
This nurse made two attempts for IV placement. Attempts made in right hand and right forearm. Hand IV infiltrated.

## 2017-03-03 NOTE — ED Notes (Signed)
CBG in triage 480

## 2017-03-03 NOTE — ED Notes (Signed)
CRITICAL VALUE ALERT  Critical Value:  K 2.7, calcium 5.4  Date & Time Notied:  03/03/2017, 1345  Provider Notified: Dr. Wilson Singer  Orders Received/Actions taken: [phone]

## 2017-03-04 ENCOUNTER — Inpatient Hospital Stay (HOSPITAL_COMMUNITY): Payer: Medicare Other

## 2017-03-04 DIAGNOSIS — R739 Hyperglycemia, unspecified: Secondary | ICD-10-CM

## 2017-03-04 DIAGNOSIS — E878 Other disorders of electrolyte and fluid balance, not elsewhere classified: Secondary | ICD-10-CM

## 2017-03-04 DIAGNOSIS — E1121 Type 2 diabetes mellitus with diabetic nephropathy: Secondary | ICD-10-CM

## 2017-03-04 DIAGNOSIS — I10 Essential (primary) hypertension: Secondary | ICD-10-CM

## 2017-03-04 DIAGNOSIS — J45909 Unspecified asthma, uncomplicated: Secondary | ICD-10-CM

## 2017-03-04 DIAGNOSIS — M05722 Rheumatoid arthritis with rheumatoid factor of left elbow without organ or systems involvement: Secondary | ICD-10-CM

## 2017-03-04 LAB — GLUCOSE, CAPILLARY
GLUCOSE-CAPILLARY: 260 mg/dL — AB (ref 65–99)
GLUCOSE-CAPILLARY: 205 mg/dL — AB (ref 65–99)
GLUCOSE-CAPILLARY: 322 mg/dL — AB (ref 65–99)
Glucose-Capillary: 169 mg/dL — ABNORMAL HIGH (ref 65–99)
Glucose-Capillary: 108 mg/dL — ABNORMAL HIGH (ref 65–99)
Glucose-Capillary: 201 mg/dL — ABNORMAL HIGH (ref 65–99)
Glucose-Capillary: 325 mg/dL — ABNORMAL HIGH (ref 65–99)

## 2017-03-04 LAB — BASIC METABOLIC PANEL
ANION GAP: 8 (ref 5–15)
BUN: 22 mg/dL — AB (ref 6–20)
CALCIUM: 8.3 mg/dL — AB (ref 8.9–10.3)
CO2: 23 mmol/L (ref 22–32)
CREATININE: 1.06 mg/dL — AB (ref 0.44–1.00)
Chloride: 107 mmol/L (ref 101–111)
GFR calc Af Amer: 53 mL/min — ABNORMAL LOW (ref >60)
GFR calc non Af Amer: 45 mL/min — ABNORMAL LOW (ref >60)
GLUCOSE: 262 mg/dL — AB (ref 65–99)
Potassium: 5.4 mmol/L — ABNORMAL HIGH (ref 3.5–5.1)
Sodium: 138 mmol/L (ref 135–145)

## 2017-03-04 LAB — HEMOGLOBIN A1C
Hgb A1c MFr Bld: 9.9 % — ABNORMAL HIGH (ref 4.8–5.6)
Mean Plasma Glucose: 237 mg/dL

## 2017-03-04 LAB — CBC
HCT: 31.9 % — ABNORMAL LOW (ref 36.0–46.0)
HEMOGLOBIN: 9.7 g/dL — AB (ref 12.0–15.0)
MCH: 28.5 pg (ref 26.0–34.0)
MCHC: 30.4 g/dL (ref 30.0–36.0)
MCV: 93.8 fL (ref 78.0–100.0)
Platelets: 264 10*3/uL (ref 150–400)
RBC: 3.4 MIL/uL — ABNORMAL LOW (ref 3.87–5.11)
RDW: 13.6 % (ref 11.5–15.5)
WBC: 19.9 10*3/uL — ABNORMAL HIGH (ref 4.0–10.5)

## 2017-03-04 LAB — MAGNESIUM: MAGNESIUM: 2.2 mg/dL (ref 1.7–2.4)

## 2017-03-04 MED ORDER — VANCOMYCIN HCL 500 MG IV SOLR
500.0000 mg | INTRAVENOUS | Status: DC
  Administered 2017-03-04: 500 mg via INTRAVENOUS
  Filled 2017-03-04 (×2): qty 500

## 2017-03-04 MED ORDER — IPRATROPIUM-ALBUTEROL 0.5-2.5 (3) MG/3ML IN SOLN
3.0000 mL | Freq: Two times a day (BID) | RESPIRATORY_TRACT | Status: DC
  Administered 2017-03-04 (×2): 3 mL via RESPIRATORY_TRACT
  Filled 2017-03-04 (×2): qty 3

## 2017-03-04 MED ORDER — ENSURE ENLIVE PO LIQD: 237.0000 mL | Freq: Two times a day (BID) | ORAL | Status: DC

## 2017-03-04 MED ORDER — POTASSIUM CHLORIDE IN NACL 20-0.45 MEQ/L-% IV SOLN
INTRAVENOUS | Status: AC
Start: 2017-03-04 — End: 2017-03-04
  Filled 2017-03-04: qty 1000

## 2017-03-04 MED ORDER — SODIUM CHLORIDE 0.9 % IV SOLN
INTRAVENOUS | Status: DC
  Administered 2017-03-04 – 2017-03-05 (×3): via INTRAVENOUS

## 2017-03-04 MED ORDER — VANCOMYCIN HCL 500 MG IV SOLR
INTRAVENOUS | Status: AC
  Filled 2017-03-04: qty 500

## 2017-03-04 MED ORDER — ENOXAPARIN SODIUM 40 MG/0.4ML ~~LOC~~ SOLN
40.0000 mg | SUBCUTANEOUS | Status: DC
  Administered 2017-03-04: 40 mg via SUBCUTANEOUS
  Filled 2017-03-04: qty 0.4

## 2017-03-04 MED ORDER — LEVOFLOXACIN IN D5W 500 MG/100ML IV SOLN
500.0000 mg | INTRAVENOUS | Status: DC
  Administered 2017-03-04 – 2017-03-06 (×2): 500 mg via INTRAVENOUS
  Filled 2017-03-04 (×2): qty 100

## 2017-03-04 MED ORDER — IPRATROPIUM-ALBUTEROL 0.5-2.5 (3) MG/3ML IN SOLN
3.0000 mL | Freq: Two times a day (BID) | RESPIRATORY_TRACT | Status: DC
Start: 2017-03-05 — End: 2017-03-07
  Administered 2017-03-05 – 2017-03-07 (×5): 3 mL via RESPIRATORY_TRACT
  Filled 2017-03-04 (×5): qty 3

## 2017-03-04 MED ORDER — ENOXAPARIN SODIUM 30 MG/0.3ML ~~LOC~~ SOLN
30.0000 mg | SUBCUTANEOUS | Status: DC
Start: 2017-03-05 — End: 2017-03-07
  Administered 2017-03-05 – 2017-03-07 (×3): 30 mg via SUBCUTANEOUS
  Filled 2017-03-04 (×3): qty 0.3

## 2017-03-04 NOTE — Progress Notes (Signed)
Pharmacy Antibiotic Note  [facility]end [name] is a 83 y.o. female admitted on 03/03/2017 now diagnosed with pneumonia.  Pharmacy has been consulted for Vancomycin and Levaquin dosing.  CXR = Right upper lobe and bibasilar pulmonary infiltrates. Findings consistent with multifocal pneumonia.  Plan: Vancomycin 500mg  IV every 24 hours.  Goal trough 15-20 mcg/mL.  Monitor labs, micro and vitals. Levaquin 500mg  IV every 48 hours. Also on Aztreonam 1gm IV every 8 hours.  Height: 5\' 2"  (157.5 cm) Weight: 95 lb 10.9 oz (43.4 kg) IBW/kg (Calculated) : 50.1  Temp (24hrs), Avg:97.9 F (36.6 C), Min:97.3 F (36.3 C), Max:98.9 F (37.2 C)   Recent Labs Lab 03/03/17 1308 03/04/17 0701  WBC 14.0* 19.9*  CREATININE 0.73 1.06*    Estimated Creatinine Clearance: 25.1 mL/min (A) (by C-G formula based on SCr of 1.06 mg/dL (H)).    Allergies  Allergen Reactions  . Aspirin Other (See Comments)    Cannot take uncoated due to stomach ulcers  . Megace [Megestrol]     abd pain   . Rocephin [Ceftriaxone Sodium In Dextrose] Swelling    Antimicrobials this admission:  Aztreonam 5/29 >> Vanc 5/30 >>  Levaquin 5/30 >>   Dose adjustments this admission:  n/a   Microbiology results:   5/29 UCx: pending   5/29 MRSA PCR: (-)   Thank you for allowing pharmacy to be a part of this patient's care.  [name] 03/04/2017 7:20 PM

## 2017-03-04 NOTE — Plan of Care (Signed)
Problem: Skin Integrity: Goal: Risk for impaired skin integrity will decrease Outcome: Not Progressing Pt has a stage 2 pressure ulcer on sacrum at admission  Problem: Tissue Perfusion: Goal: Risk factors for ineffective tissue perfusion will decrease Outcome: Progressing Atrial fib has resolved. . Pt receiving lovenox sq qd and has scd;s in place.  Problem: Bowel/Gastric: Goal: Will not experience complications related to bowel motility Outcome: Progressing Large formed,solid  impaction digitally removed during the night. Pt tolerated well.  Problem: Metabolic: Goal: Ability to maintain appropriate glucose levels will improve Outcome: Progressing Pt is no longer on insulin drip  Problem: Nutritional: Goal: Maintenance of adequate nutrition will improve Outcome: Not Progressing Poor po intake. Pt is pocketing food in her mouth instead of swallowing it

## 2017-03-04 NOTE — Care Management Note (Signed)
Case Management Note  Patient Details  Name: [name] MRN: [id number] Date of Birth: [birth year]  Subjective/Objective:  Chart reviewed. Patient here recently in April. From home with family, has 24/7 caregivers, bedbound.  Adm with hyperglycemia, poor PO intake, ? UTI.   No HH PTA. Reportedly has all the DME needed.              Action/Plan: Disposition unsure at this time. CM following for needs.    Expected Discharge Date:  03/05/17               Expected Discharge Plan:     In-House Referral:     Discharge planning Services  CM Consult  Post Acute Care Choice:    Choice offered to:     DME Arranged:    DME Agency:     HH Arranged:    HH Agency:     Status of Service:  In process, will continue to follow  If discussed at Long Length of Stay Meetings, dates discussed:    Additional Comments:  Radley Teston, Chauncey Reading, RN 03/04/2017, 2:48 PM

## 2017-03-04 NOTE — Progress Notes (Signed)
PROGRESS NOTE    [name]  POE:[id number] DOB: 1926/08/16 DOA: 03/03/2017 PCP: Moshe Cipro, MD    Brief Narrative: 83 -year-old female with a history of advanced rheumatoid arthritis who is bedbound, is present the hospital by her family due to generalized weakness and elevated blood sugars. By mouth intake is poor for 2-3 days prior to admission. She was noted to be dehydrated with a possible urinary tract infection. Start on antibiotics. Blood sugars have improved with insulin therapy. Her by mouth intake remains poor. She was noted to be in SVT and emergency room which responded to adenosine. She's continued on her SA nodal blocking agents.   Assessment & Plan:   Principal Problem:   Hyperglycemia Active Problems:   Rheumatoid arthritis (HCC)   HTN (hypertension)   Diabetes type 2, controlled (HCC)   Breast cancer of upper-outer quadrant of left female breast (HCC)   Asthma, chronic   Electrolyte abnormality   1. Hyperglycemia in type 2 diabetes. She initially presented with hyperglycemia blood sugar 480. She was initially started on IV insulin with improvement of blood sugars and is now on sliding scale. She was previously on metformin, but on last discharge, this was changed to [place]. Family reports that her blood sugars have been running high at home since her last discharge. Continue to monitor. 2. Possible UTI. Urinalysis indicates possible infection. Currently on aztreonam. Follow-up urine culture. 3. SVT. Patient did have an episode of SVT with heart rate in the 170s. She was received dose of adenosine with improvement of her blood pressure. She is continued on Cardizem and Toprol. 4. Failure to thrive. Family reports that patient has been spitting out her food and refusing to eat. This may be related to underlying infection versus progression of some mild underlying dementia. Continue to follow. 5. Rheumatoid arthritis. Continue home dose of chronic  steroids. 6. Functional quadriplegia. Patient has been bedbound for quite some time now due to her severe/advanced rheumatoid arthritis. 7. Electrolyte abnormalities. Initially, potassium was noted to be low at 2.7 as was calcium. These have been replaced. 8. Leukocytosis. Possibly related to urinary tract infection. Check chest x-ray. 9. Acute on chronic anemia. Suspect this is related to chronic disease. No evidence of bleeding. Continue to monitor.   DVT prophylaxis: lovenox Code Status: full code Family Communication: discussed with 2 daughters at the bedside Disposition Plan: probable discharge home once improved   Consultants:     Procedures:     Antimicrobials:   Aztreonam 5/29>>    Subjective: Denies any pain or shortness of breath.  Objective: Vitals:   03/04/17 0400 03/04/17 0500 03/04/17 0600 03/04/17 0734  BP: (!) 96/51  (!) 92/49   Pulse: (!) 115  (!) 108 (!) 112  Resp: (!) 22  (!) 22 (!) 22  Temp: 98.9 F (37.2 C)   97.6 F (36.4 C)  TempSrc: Axillary   Axillary  SpO2: 99%  100% 100%  Weight:  43.4 kg (95 lb 10.9 oz)    Height:        Intake/Output Summary (Last 24 hours) at 03/04/17 0939 Last data filed at 03/04/17 0600  Gross per 24 hour  Intake           2960.5 ml  Output              300 ml  Net           2660.5 ml   Filed Weights   03/03/17 1152 03/03/17 1759 03/04/17 0500  Weight: 48.1 kg (106 lb) 44.5 kg (98 lb 1.7 oz) 43.4 kg (95 lb 10.9 oz)    Examination:  General exam: Appears calm and comfortable  Respiratory system: Clear to auscultation. Respiratory effort normal. Cardiovascular system: S1 & S2 heard, RRR. No JVD, murmurs, rubs, gallops or clicks. No pedal edema. Gastrointestinal system: Abdomen is nondistended, soft and nontender. No organomegaly or masses felt. Normal bowel sounds heard. Central nervous system: No focal neurological deficits although exam is limited due to generalized weakness. Extremities: Symmetric, no  cyanosis or clubbing. Skin: No rashes, lesions or ulcers Psychiatry: somnolent but pleasant    Data Reviewed: I have personally reviewed following labs and imaging studies  CBC:  Recent Labs Lab 03/03/17 1308 03/04/17 0701  WBC 14.0* 19.9*  NEUTROABS 12.0*  --   HGB 8.0* 9.7*  HCT 26.8* 31.9*  MCV 94.4 93.8  PLT 213 326   Basic Metabolic Panel:  Recent Labs Lab 03/03/17 1308 03/04/17 0701  NA 144 138  K 2.7* 5.4*  CL 119* 107  CO2 18* 23  GLUCOSE 335* 262*  BUN 19 22*  CREATININE 0.73 1.06*  CALCIUM 5.4* 8.3*  MG 1.3* 2.2   GFR: Estimated Creatinine Clearance: 25.1 mL/min (A) (by C-G formula based on SCr of 1.06 mg/dL (H)). Liver Function Tests: No results for input(s): AST, ALT, ALKPHOS, BILITOT, PROT, ALBUMIN in the last 168 hours. No results for input(s): LIPASE, AMYLASE in the last 168 hours. No results for input(s): AMMONIA in the last 168 hours. Coagulation Profile: No results for input(s): INR, PROTIME in the last 168 hours. Cardiac Enzymes: No results for input(s): CKTOTAL, CKMB, CKMBINDEX, TROPONINI in the last 168 hours. BNP (last 3 results) No results for input(s): PROBNP in the last 8760 hours. HbA1C:  Recent Labs  03/03/17 1308  HGBA1C 9.9*   CBG:  Recent Labs Lab 03/03/17 1752 03/03/17 1902 03/03/17 2001 03/03/17 2155 03/04/17 0733  GLUCAP 242* 169* 108* 201* 260*   Lipid Profile: No results for input(s): CHOL, HDL, LDLCALC, TRIG, CHOLHDL, LDLDIRECT in the last 72 hours. Thyroid Function Tests: No results for input(s): TSH, T4TOTAL, FREET4, T3FREE, THYROIDAB in the last 72 hours. Anemia Panel: No results for input(s): VITAMINB12, FOLATE, FERRITIN, TIBC, IRON, RETICCTPCT in the last 72 hours. Sepsis Labs: No results for input(s): PROCALCITON, LATICACIDVEN in the last 168 hours.  Recent Results (from the past 240 hour(s))  MRSA PCR Screening     Status: None   Collection Time: 03/03/17  5:54 PM  Result Value Ref Range Status    MRSA by PCR NEGATIVE NEGATIVE Final    Comment:        The GeneXpert MRSA Assay (FDA approved for NASAL specimens only), is one component of a comprehensive MRSA colonization surveillance program. It is not intended to diagnose MRSA infection nor to guide or monitor treatment for MRSA infections.          Radiology Studies: No results found.      Scheduled Meds: . atorvastatin  10 mg Oral q1800  . cholecalciferol  2,000 Units Oral Daily  . diltiazem  120 mg Oral Daily  . insulin aspart  0-9 Units Subcutaneous TID WC  . ipratropium-albuterol  3 mL Nebulization BID  . magnesium oxide  200 mg Oral Daily  . metoprolol succinate  25 mg Oral Daily  . mometasone-formoterol  2 puff Inhalation BID  . multivitamin with minerals  1 tablet Oral q morning - 10a  . pantoprazole  40 mg Oral Daily  .  predniSONE  10 mg Oral Q breakfast  . raloxifene  60 mg Oral q morning - 10a   Continuous Infusions: . sodium chloride    . aztreonam Stopped (03/04/17 0403)     LOS: 1 day    Time spent: 9mins    Julie-Anne Torain, MD [facility] Pager [phone]  If 7PM-7AM, please contact night-coverage [email or web address] Password [facility] 03/04/2017, 9:39 AM

## 2017-03-05 ENCOUNTER — Other Ambulatory Visit (HOSPITAL_COMMUNITY): Payer: Self-pay

## 2017-03-05 DIAGNOSIS — C50412 Malignant neoplasm of upper-outer quadrant of left female breast: Secondary | ICD-10-CM

## 2017-03-05 DIAGNOSIS — Z170 Estrogen receptor positive status [ER+]: Secondary | ICD-10-CM

## 2017-03-05 DIAGNOSIS — J189 Pneumonia, unspecified organism: Secondary | ICD-10-CM

## 2017-03-05 LAB — BASIC METABOLIC PANEL
Anion gap: 6 (ref 5–15)
BUN: 19 mg/dL (ref 6–20)
CALCIUM: 7.4 mg/dL — AB (ref 8.9–10.3)
CO2: 21 mmol/L — ABNORMAL LOW (ref 22–32)
Chloride: 111 mmol/L (ref 101–111)
Creatinine, Ser: 0.82 mg/dL (ref 0.44–1.00)
GFR calc Af Amer: >60 mL/min (ref >60)
Glucose, Bld: 214 mg/dL — ABNORMAL HIGH (ref 65–99)
Potassium: 3.9 mmol/L (ref 3.5–5.1)
SODIUM: 138 mmol/L (ref 135–145)

## 2017-03-05 LAB — GLUCOSE, CAPILLARY
GLUCOSE-CAPILLARY: 283 mg/dL — AB (ref 65–99)
GLUCOSE-CAPILLARY: 405 mg/dL — AB (ref 65–99)
GLUCOSE-CAPILLARY: 444 mg/dL — AB (ref 65–99)
Glucose-Capillary: 210 mg/dL — ABNORMAL HIGH (ref 65–99)
Glucose-Capillary: 342 mg/dL — ABNORMAL HIGH (ref 65–99)
Glucose-Capillary: 266 mg/dL — ABNORMAL HIGH (ref 65–99)

## 2017-03-05 LAB — URINE CULTURE: Culture: 60000 — AB

## 2017-03-05 LAB — CBC
HCT: 26.4 % — ABNORMAL LOW (ref 36.0–46.0)
Hemoglobin: 8.1 g/dL — ABNORMAL LOW (ref 12.0–15.0)
MCH: 28.8 pg (ref 26.0–34.0)
MCHC: 30.7 g/dL (ref 30.0–36.0)
MCV: 94 fL (ref 78.0–100.0)
PLATELETS: 222 10*3/uL (ref 150–400)
RBC: 2.81 MIL/uL — ABNORMAL LOW (ref 3.87–5.11)
RDW: 13.3 % (ref 11.5–15.5)
WBC: 13.1 10*3/uL — AB (ref 4.0–10.5)

## 2017-03-05 MED ORDER — FLUCONAZOLE 100 MG PO TABS
150.0000 mg | ORAL_TABLET | Freq: Once | ORAL | Status: AC
  Administered 2017-03-05: 150 mg via ORAL
  Filled 2017-03-05: qty 2

## 2017-03-05 MED ORDER — ZINC OXIDE 40 % EX OINT
TOPICAL_OINTMENT | Freq: Two times a day (BID) | CUTANEOUS | Status: DC
  Filled 2017-03-05: qty 114

## 2017-03-05 MED ORDER — TAMOXIFEN CITRATE 20 MG PO TABS: 20.0000 mg | ORAL_TABLET | Freq: Every day | ORAL | 3 refills | Status: DC

## 2017-03-05 MED ORDER — INSULIN ASPART 100 UNIT/ML ~~LOC~~ SOLN: 0.0000 [IU] | Freq: Every day | SUBCUTANEOUS | Status: DC

## 2017-03-05 MED ORDER — IPRATROPIUM-ALBUTEROL 0.5-2.5 (3) MG/3ML IN SOLN
3.0000 mL | Freq: Four times a day (QID) | RESPIRATORY_TRACT | Status: DC | PRN
Start: 2017-03-05 — End: 2017-03-07

## 2017-03-05 MED ORDER — INSULIN GLARGINE 100 UNIT/ML ~~LOC~~ SOLN
5.0000 [IU] | Freq: Every day | SUBCUTANEOUS | Status: DC
  Administered 2017-03-05 – 2017-03-06 (×2): 5 [IU] via SUBCUTANEOUS
  Filled 2017-03-05 (×6): qty 0.05

## 2017-03-05 MED ORDER — INSULIN ASPART 100 UNIT/ML ~~LOC~~ SOLN
25.0000 [IU] | Freq: Once | SUBCUTANEOUS | Status: AC
  Administered 2017-03-05: 25 [IU] via SUBCUTANEOUS

## 2017-03-05 MED ORDER — GUAIFENESIN ER 600 MG PO TB12
1200.0000 mg | ORAL_TABLET | Freq: Two times a day (BID) | ORAL | Status: DC
  Administered 2017-03-05 – 2017-03-07 (×5): 1200 mg via ORAL
  Filled 2017-03-05 (×5): qty 2

## 2017-03-05 MED ORDER — ZINC OXIDE 20 % EX OINT
TOPICAL_OINTMENT | Freq: Two times a day (BID) | CUTANEOUS | Status: DC
  Administered 2017-03-05: 23:00:00 via TOPICAL
  Administered 2017-03-05: 1 via TOPICAL
  Administered 2017-03-06 (×2): via TOPICAL
  Administered 2017-03-07: 1 via TOPICAL
  Filled 2017-03-05: qty 28.35

## 2017-03-05 MED ORDER — GLUCERNA SHAKE PO LIQD
237.0000 mL | Freq: Two times a day (BID) | ORAL | Status: DC
  Administered 2017-03-05 – 2017-03-07 (×3): 237 mL via ORAL

## 2017-03-05 MED ORDER — INSULIN ASPART 100 UNIT/ML ~~LOC~~ SOLN
0.0000 [IU] | Freq: Three times a day (TID) | SUBCUTANEOUS | Status: DC
  Administered 2017-03-05: 20 [IU] via SUBCUTANEOUS
  Administered 2017-03-06: 4 [IU] via SUBCUTANEOUS
  Administered 2017-03-06 – 2017-03-07 (×2): 7 [IU] via SUBCUTANEOUS
  Administered 2017-03-07: 4 [IU] via SUBCUTANEOUS

## 2017-03-05 MED ORDER — INSULIN GLARGINE 100 UNIT/ML ~~LOC~~ SOLN
10.0000 [IU] | Freq: Once | SUBCUTANEOUS | Status: AC
  Administered 2017-03-05: 10 [IU] via SUBCUTANEOUS
  Filled 2017-03-05: qty 0.1

## 2017-03-05 NOTE — Progress Notes (Signed)
Patient's recheck CBG 444, Dr Roderic Palau notified,orders received, and given. Will  Continue to monitor patient. Family at bedside.

## 2017-03-05 NOTE — Progress Notes (Signed)
Patient's CBG 405, Dr Roderic Palau notified. Will continue to monitor patient.

## 2017-03-05 NOTE — Progress Notes (Signed)
Inpatient Diabetes Program Recommendations  AACE/ADA: New Consensus Statement on Inpatient Glycemic Control (2015)  Target Ranges:  Prepandial:   less than 140 mg/dL      Peak postprandial:   less than 180 mg/dL (1-2 hours)      Critically ill patients:  140 - 180 mg/dL   Results for NORVELL, URESTE (MRN [id number]) as of 03/05/2017 08:04  Ref. Range 03/04/2017 11:25 03/04/2017 16:21 03/04/2017 21:19 03/05/2017 07:24  Glucose-Capillary Latest Ref Range: 65 - 99 mg/dL 205 (H) 325 (H) 322 (H) 210 (H)   Review of Glycemic Control  Diabetes history: DM2 Outpatient Diabetes medications: Tradjenta 5 mg daily Current orders for Inpatient glycemic control: Novolog 0-9 units TID with meals  Inpatient Diabetes Program Recommendations: Insulin - Basal: Please consider order Lantus 5 units daily starting now. Insulin - Meal Coverage: While inpatient and ordered steroids, please consider ordering Novolog 3 units TID with meals for meal coverage if patient eats at least 50% of meals.  Thanks, Barnie Alderman, RN, MSN, CDE Diabetes Coordinator Inpatient Diabetes Program [phone] (Team Pager from 8am to 5pm)

## 2017-03-05 NOTE — Consult Note (Signed)
[facility] Nurse wound consult note Reason for Consult:Evolution of Moisture Associated Skin Damage noted to sacrum and buttocks on 01/2017.  Pressure and moisture have kept this area nonintact and there is now the presence of slough (20%) to wound bed over coccyx.   Wound type:Unstageable pressure injury due to the presence of moisture and pressure.  Pressure Injury POA: Yes Measurement: 2 cm x 2 cm, covers sacrum and left buttock.  Adherent thin slough to 20% wound prohibits visualization of wound bed.   Thin body habitus places patient at increased risk to all bony prominences.  Will offload heels and provide mattress replacement with low air loss feature.  Wound MHW:KGSUPJ (20%) and pink and moist (80%) Drainage (amount, consistency, odor) Scant serosanguinous  No odor.  Periwound:Blanchable erythema Dressing procedure/placement/frequency:Cleanse sacral/buttocks wound with soap and water.  Apply Boudreaux butt paste to nonintact area twice daily. No disposable briefs or underpads while in bed.  Turn and reposition every two hours.  Low air loss mattress replacement.  Float heels.   Will not follow at this time.  Please re-consult if needed.  Domenic Moras RN BSN [place] Pager [phone]

## 2017-03-05 NOTE — Telephone Encounter (Signed)
Received refill request for Tamoxifen from patients pharmacy. Reviewed with provider, chart checked and refilled.

## 2017-03-05 NOTE — Progress Notes (Signed)
Transferred to 308 in stable condition, reported to V. Dildy, LPN.

## 2017-03-05 NOTE — Progress Notes (Signed)
PROGRESS NOTE    [name]  YQM:[id number] DOB: Oct 16, 1925 DOA: 03/03/2017 PCP: Moshe Cipro, MD    Brief Narrative: 83 -year-old female with a history of advanced rheumatoid arthritis who is bedbound, is present the hospital by her family due to generalized weakness and elevated blood sugars. By mouth intake is poor for 2-3 days prior to admission. She was noted to be dehydrated with a possible urinary tract infection. Start on antibiotics. Blood sugars have improved with insulin therapy. Her by mouth intake remains poor. She was noted to be in SVT and emergency room which responded to adenosine. She's continued on her SA nodal blocking agents.   Assessment & Plan:   Principal Problem:   Hyperglycemia Active Problems:   Rheumatoid arthritis (HCC)   HTN (hypertension)   Diabetes type 2, controlled (HCC)   Breast cancer of upper-outer quadrant of left female breast (HCC)   Asthma, chronic   Electrolyte abnormality   1. Hyperglycemia in type 2 diabetes. She initially presented with hyperglycemia blood sugar 480. She was initially started on IV insulin with improvement of blood sugars and is now on sliding scale. She was previously on metformin, but on last discharge, this was changed to [place]. Family reports that her blood sugars have been running high at home since her last discharge. Will start on lantus today. Continue to monitor. 2. HCAP. Chest xray shows right upper lobe and lower lobe pneumonia. Started on levaquin. Since she appears to have recurrent pneumonia, will request speech/swallow evaluation. MRSA PCR is negative, so will not continue vancomycin. Continue pulmonary hygiene. 3. Possible UTI. Urine culture with yeast. Discontinue aztreonam. Will give one dose of diflucan 4. SVT. Patient did have an episode of SVT with heart rate in the 170s. She was received dose of adenosine with improvement of her blood pressure. She is continued on Cardizem and Toprol. 5. Failure to  thrive. Family reports that patient has been spitting out her food and refusing to eat. This may be related to underlying infection versus progression of some mild underlying dementia. Since she has been better hydrated, family reports po intake is improving. 6. Rheumatoid arthritis. Continue home dose of chronic steroids. 7. Functional quadriplegia. Patient has been bedbound for quite some time now due to her severe/advanced rheumatoid arthritis. 8. Electrolyte abnormalities. Initially, potassium was noted to be low at 2.7 as was calcium. These have been replaced. 9. Leukocytosis. Related to PNA and dehydration. Improving. 10. Acute on chronic anemia. Suspect this is related to chronic disease. No evidence of bleeding. Continue to monitor.   DVT prophylaxis: lovenox Code Status: full code Family Communication: discussed with 2 daughters at the bedside Disposition Plan: probable discharge home once improved   Consultants:     Procedures:     Antimicrobials:   Aztreonam 5/29>> 5/31  levaquin 5/30>>   Subjective: More awake today. Denies cough  Objective: Vitals:   03/05/17 0600 03/05/17 0726 03/05/17 0845 03/05/17 0851  BP: (!) 94/47     Pulse: 82     Resp: 15     Temp:  97.4 F (36.3 C)    TempSrc:  Axillary    SpO2: 97%  96% 96%  Weight:      Height:        Intake/Output Summary (Last 24 hours) at 03/05/17 0936 Last data filed at 03/05/17 0600  Gross per 24 hour  Intake             2790 ml  Output  500 ml  Net             2290 ml   Filed Weights   03/03/17 1759 03/04/17 0500 03/05/17 0500  Weight: 44.5 kg (98 lb 1.7 oz) 43.4 kg (95 lb 10.9 oz) 46.5 kg (102 lb 8.2 oz)    Examination:  General exam: Alert, awake, no distress Respiratory system: crackles at bases. Respiratory effort normal. Cardiovascular system:RRR. No murmurs, rubs, gallops. Gastrointestinal system: Abdomen is nondistended, soft and nontender. No organomegaly or masses felt.  Normal bowel sounds heard. Central nervous system: No focal neurological deficits. Extremities: No C/C/E, +pedal pulses Skin: No rashes, lesions or ulcers Psychiatry: pleasant, cooperative  Data Reviewed: I have personally reviewed following labs and imaging studies  CBC:  Recent Labs Lab 03/03/17 1308 03/04/17 0701 03/05/17 0503  WBC 14.0* 19.9* 13.1*  NEUTROABS 12.0*  --   --   HGB 8.0* 9.7* 8.1*  HCT 26.8* 31.9* 26.4*  MCV 94.4 93.8 94.0  PLT 213 264 841   Basic Metabolic Panel:  Recent Labs Lab 03/03/17 1308 03/04/17 0701 03/05/17 0503  NA 144 138 138  K 2.7* 5.4* 3.9  CL 119* 107 111  CO2 18* 23 21*  GLUCOSE 335* 262* 214*  BUN 19 22* 19  CREATININE 0.73 1.06* 0.82  CALCIUM 5.4* 8.3* 7.4*  MG 1.3* 2.2  --    GFR: Estimated Creatinine Clearance: 34.8 mL/min (by C-G formula based on SCr of 0.82 mg/dL). Liver Function Tests: No results for input(s): AST, ALT, ALKPHOS, BILITOT, PROT, ALBUMIN in the last 168 hours. No results for input(s): LIPASE, AMYLASE in the last 168 hours. No results for input(s): AMMONIA in the last 168 hours. Coagulation Profile: No results for input(s): INR, PROTIME in the last 168 hours. Cardiac Enzymes: No results for input(s): CKTOTAL, CKMB, CKMBINDEX, TROPONINI in the last 168 hours. BNP (last 3 results) No results for input(s): PROBNP in the last 8760 hours. HbA1C:  Recent Labs  03/03/17 1308  HGBA1C 9.9*   CBG:  Recent Labs Lab 03/04/17 0733 03/04/17 1125 03/04/17 1621 03/04/17 2119 03/05/17 0724  GLUCAP 260* 205* 325* 322* 210*   Lipid Profile: No results for input(s): CHOL, HDL, LDLCALC, TRIG, CHOLHDL, LDLDIRECT in the last 72 hours. Thyroid Function Tests: No results for input(s): TSH, T4TOTAL, FREET4, T3FREE, THYROIDAB in the last 72 hours. Anemia Panel: No results for input(s): VITAMINB12, FOLATE, FERRITIN, TIBC, IRON, RETICCTPCT in the last 72 hours. Sepsis Labs: No results for input(s): PROCALCITON,  LATICACIDVEN in the last 168 hours.  Recent Results (from the past 240 hour(s))  Urine culture     Status: Abnormal   Collection Time: 03/03/17 11:58 AM  Result Value Ref Range Status   Specimen Description URINE, CATHETERIZED  Final   Special Requests NONE  Final   Culture 60,000 COLONIES/mL YEAST (A)  Final   Report Status 03/05/2017 FINAL  Final  MRSA PCR Screening     Status: None   Collection Time: 03/03/17  5:54 PM  Result Value Ref Range Status   MRSA by PCR NEGATIVE NEGATIVE Final    Comment:        The GeneXpert MRSA Assay (FDA approved for NASAL specimens only), is one component of a comprehensive MRSA colonization surveillance program. It is not intended to diagnose MRSA infection nor to guide or monitor treatment for MRSA infections.          Radiology Studies: Dg Chest Port 1 View  Result Date: 03/04/2017 CLINICAL DATA:  Leukocytosis. EXAM:  PORTABLE CHEST 1 VIEW COMPARISON:  07/19/2016 . FINDINGS: Heart size normal. Right upper lobe and bibasilar pulmonary infiltrates. Interposition of the colon under the left hemidiaphragm again noted. Surgical clips left axilla. IMPRESSION: Right upper lobe and bibasilar pulmonary infiltrates. Findings consistent with multifocal pneumonia. Electronically Signed   By: [place]   On: 03/04/2017 13:17        Scheduled Meds: . atorvastatin  10 mg Oral q1800  . cholecalciferol  2,000 Units Oral Daily  . diltiazem  120 mg Oral Daily  . enoxaparin (LOVENOX) injection  30 mg Subcutaneous Q24H  . feeding supplement (ENSURE ENLIVE)  237 mL Oral BID BM  . guaiFENesin  1,200 mg Oral BID  . insulin aspart  0-9 Units Subcutaneous TID WC  . insulin glargine  5 Units Subcutaneous Daily  . ipratropium-albuterol  3 mL Nebulization BID  . magnesium oxide  200 mg Oral Daily  . metoprolol succinate  25 mg Oral Daily  . mometasone-formoterol  2 puff Inhalation BID  . multivitamin with minerals  1 tablet Oral q morning - 10a  .  pantoprazole  40 mg Oral Daily  . predniSONE  10 mg Oral Q breakfast  . raloxifene  60 mg Oral q morning - 10a  . zinc oxide   Topical BID   Continuous Infusions: . levofloxacin (LEVAQUIN) IV Stopped (03/04/17 2128)     LOS: 2 days    Time spent: 2mins    Bonney Berres, MD [facility] Pager [phone]  If 7PM-7AM, please contact night-coverage [email or web address] Password [facility] 03/05/2017, 9:36 AM

## 2017-03-06 DIAGNOSIS — J189 Pneumonia, unspecified organism: Secondary | ICD-10-CM | POA: Diagnosis present

## 2017-03-06 LAB — BASIC METABOLIC PANEL
ANION GAP: 5 (ref 5–15)
BUN: 15 mg/dL (ref 6–20)
CALCIUM: 7.8 mg/dL — AB (ref 8.9–10.3)
CO2: 23 mmol/L (ref 22–32)
Chloride: 107 mmol/L (ref 101–111)
Creatinine, Ser: 0.77 mg/dL (ref 0.44–1.00)
GFR calc Af Amer: >60 mL/min (ref >60)
GLUCOSE: 115 mg/dL — AB (ref 65–99)
Potassium: 3.7 mmol/L (ref 3.5–5.1)
Sodium: 135 mmol/L (ref 135–145)

## 2017-03-06 LAB — GLUCOSE, CAPILLARY
GLUCOSE-CAPILLARY: 93 mg/dL (ref 65–99)
GLUCOSE-CAPILLARY: 164 mg/dL — AB (ref 65–99)
Glucose-Capillary: 100 mg/dL — ABNORMAL HIGH (ref 65–99)
Glucose-Capillary: 140 mg/dL — ABNORMAL HIGH (ref 65–99)
Glucose-Capillary: 246 mg/dL — ABNORMAL HIGH (ref 65–99)

## 2017-03-06 LAB — CBC
HCT: 29.5 % — ABNORMAL LOW (ref 36.0–46.0)
Hemoglobin: 9.1 g/dL — ABNORMAL LOW (ref 12.0–15.0)
MCH: 28.3 pg (ref 26.0–34.0)
MCHC: 30.8 g/dL (ref 30.0–36.0)
MCV: 91.9 fL (ref 78.0–100.0)
PLATELETS: 228 10*3/uL (ref 150–400)
RBC: 3.21 MIL/uL — ABNORMAL LOW (ref 3.87–5.11)
RDW: 13.1 % (ref 11.5–15.5)
WBC: 11.5 10*3/uL — AB (ref 4.0–10.5)

## 2017-03-06 MED ORDER — INSULIN ASPART 100 UNIT/ML ~~LOC~~ SOLN
4.0000 [IU] | Freq: Three times a day (TID) | SUBCUTANEOUS | Status: DC
  Administered 2017-03-06 – 2017-03-07 (×2): 4 [IU] via SUBCUTANEOUS

## 2017-03-06 MED ORDER — INSULIN GLARGINE 100 UNIT/ML ~~LOC~~ SOLN
10.0000 [IU] | Freq: Every day | SUBCUTANEOUS | Status: DC
  Filled 2017-03-06 (×3): qty 0.1

## 2017-03-06 MED ORDER — INSULIN GLARGINE 100 UNIT/ML ~~LOC~~ SOLN
5.0000 [IU] | Freq: Once | SUBCUTANEOUS | Status: AC
  Administered 2017-03-06: 5 [IU] via SUBCUTANEOUS
  Filled 2017-03-06: qty 0.05

## 2017-03-06 NOTE — Evaluation (Signed)
Clinical/Bedside Swallow Evaluation Patient Details  Name: [name] MRN: [id number] Date of Birth: 22-Sep-1926  Today's Date: 03/06/2017 Time: SLP Start Time (ACUTE ONLY): 1629 SLP Stop Time (ACUTE ONLY): 1657 SLP Time Calculation (min) (ACUTE ONLY): 28 min  Past Medical History:  Past Medical History:  Diagnosis Date  . Acid reflux   . Anemia   . Arthritis    ra  . Asthma   . Cancer [facility])    breast cancer  . Coronary artery disease   . Diabetes mellitus without complication ([place])   . Diverticulitis   . Gastric ulcer   . Hypertension   . Hypoxemia   . Kyphosis   . Kyphosis   . Mitral valve disorder   . Osteoporosis   . Oxygen dependent    2 liters at night  . Raynaud disease   . Thrombocytopenia ([place])    Past Surgical History:  Past Surgical History:  Procedure Laterality Date  . BREAST SURGERY    . HERNIA REPAIR    . MASTECTOMY Right 1978  . SIMPLE MASTECTOMY WITH AXILLARY SENTINEL NODE BIOPSY Left 05/09/2015   Procedure: LEFT SIMPLE MASTECTOMY;  Surgeon: Erroll Luna, MD;  Location: [facility];  Service: General;  Laterality: Left;   HPI:  [name] is a 83 y.o. female with medical history significant of Rheumatoid arthritis, DM2, history of breast cancer,  hypertension, asthma. Presented to the ED today with complaints of elevated blood sugar- 480. Called PCPs office with told him to come to the ED. Patient's 2 daughters are also present they have primary caregiver to provide most of the history- patient is awake appears lethargic but responds to most questions appropriately. Patient's blood sugar is checked once a week, due to adequate control. Last check was a week ago- was in the 100s.   Assessment / Plan / Recommendation Clinical Impression  Pt was seen at bedside with daughter and grand-daughter present for evaluation. Daughters provide total care for the pt at home; pt presents with cervical kyphosis but is able to tolerate repositioning for safe positioning  for PO. Pt is edentulous and has dentures but does not wear them for PO. Note lingual surface had a white/yellow coating resembling thrush that was unable to be completely cleared with a toothette and oral care; lingual coating was reported to nursing. SLP provided education of the importance of oral care despite edentulous status and provided with approprite tools to provide. Pt consumed puree textures, regular textures and thin liquids (via straw which is easier d/t pt's positioning) revealing no overt s/sx of aspiration. Pt's family is present for all meals and will cut meats appropriately (to mech soft textures) therefore recommend continue Regular textures and thin liquids. No further ST needs are noted at this time.  SLP Visit Diagnosis: Dysphagia, oropharyngeal phase (R13.12)    Aspiration Risk  Mild aspiration risk    Diet Recommendation Regular;Thin liquid   Liquid Administration via: Straw Medication Administration: Whole meds with liquid Supervision: Full supervision/cueing for compensatory strategies Compensations: Minimize environmental distractions;Slow rate;Small sips/bites;Multiple dry swallows after each bite/sip;Follow solids with liquid Postural Changes: Seated upright at 90 degrees;Remain upright for at least 30 minutes after po intake    Other  Recommendations Oral Care Recommendations: Oral care BID     Swallow Study   General Date of Onset: 03/03/17 HPI: [name] is a 83 y.o. female with medical history significant of Rheumatoid arthritis, DM2, history of breast cancer,  hypertension, asthma. Presented to the ED today with  complaints of elevated blood sugar- 480. Called PCPs office with told him to come to the ED. Patient's 2 daughters are also present they have primary caregiver to provide most of the history- patient is awake appears lethargic but responds to most questions appropriately. Patient's blood sugar is checked once a week, due to adequate control. Last  check was a week ago- was in the 100s. Type of Study: Bedside Swallow Evaluation Previous Swallow Assessment: 2 BSE 2017 Diet Prior to this Study: Regular;Thin liquids Temperature Spikes Noted: No Respiratory Status: Room air History of Recent Intubation: No Behavior/Cognition: Alert;Cooperative;Pleasant mood Oral Cavity Assessment: Dry (lingual surface with white coating (see note)) Oral Care Completed by SLP: Yes Oral Cavity - Dentition: Edentulous Vision: Impaired for self-feeding Self-Feeding Abilities: Total assist Patient Positioning: Upright in bed Baseline Vocal Quality: Normal;Low vocal intensity Volitional Cough: Weak Volitional Swallow: Unable to elicit    Oral/Motor/Sensory Function Overall Oral Motor/Sensory Function: Within functional limits   Ice Chips     Thin Liquid Thin Liquid: Within functional limits    Nectar Thick     Honey Thick     Puree Puree: Within functional limits   Solid  [name], CCC-SLP Speech Language Pathologist    Solid: Within functional limits        [name] 03/06/2017,5:13 PM

## 2017-03-06 NOTE — Progress Notes (Signed)
PROGRESS NOTE    [name]  BTD:[id number] DOB: [birth year] DOA: 03/03/2017 PCP: Moshe Cipro, MD    Brief Narrative: 83 -year-old female with a history of advanced rheumatoid arthritis who is bedbound, is present the hospital by her family due to generalized weakness and elevated blood sugars. By mouth intake is poor for 2-3 days prior to admission. She was noted to be dehydrated with a possible urinary tract infection. Start on antibiotics. Blood sugars have improved with insulin therapy. Her by mouth intake remains poor. She was noted to be in SVT and emergency room which responded to adenosine. She's continued on her SA nodal blocking agents.   Assessment & Plan:   Principal Problem:   Hyperglycemia Active Problems:   Rheumatoid arthritis (HCC)   HTN (hypertension)   Diabetes type 2, controlled (HCC)   Breast cancer of upper-outer quadrant of left female breast (HCC)   Asthma, chronic   Electrolyte abnormality   1. Hyperglycemia in type 2 diabetes. She initially presented with hyperglycemia blood sugar 480. She was initially started on IV insulin with improvement of blood sugars and is now on sliding scale. She was previously on metformin, but on last discharge, this was changed to [place]. Family reports that her blood sugars have been running high at home since her last discharge. A1c noted to be 9.9. she has been started on lantus, but blood sugars have been very labile. Will add meal coverage novolog. Continue to monitor. 2. HCAP. Chest xray shows right upper lobe and lower lobe pneumonia. Currently on levaquin. Since she appears to have recurrent pneumonia, speech/swallow evaluation pending. Continue pulmonary hygiene. 3. SVT. Patient did have an episode of SVT with heart rate in the 170s. She was received dose of adenosine in ED with improvement of her blood pressure. She is continued on Cardizem and Toprol. 4. Failure to thrive. Family reports that patient has been spitting  out her food and refusing to eat. This was likely related to underlying infection/dehydration. Since she has been better hydrated, family reports po intake is improving. 5. Rheumatoid arthritis. Continue home dose of chronic steroids. 6. Functional quadriplegia. Patient has been bedbound for quite some time now due to her severe/advanced rheumatoid arthritis. 7. Electrolyte abnormalities. Initially, potassium was noted to be low at 2.7 as was calcium. These have been replaced. 8. Leukocytosis. Related to PNA and dehydration. Improving. 9. Acute on chronic anemia. Suspect this is related to chronic disease. No evidence of bleeding. Continue to monitor.   DVT prophylaxis: lovenox Code Status: full code Family Communication: discussed with grand daughter at the bedside Disposition Plan: probable discharge home once improved in AM   Consultants:     Procedures:     Antimicrobials:   Aztreonam 5/29>> 5/31  levaquin 5/30>>   Subjective: No pain. She has a non productive cough. No shortness of breath  Objective: Vitals:   03/06/17 0812 03/06/17 0816 03/06/17 1056 03/06/17 1437  BP:   (!) 105/41 (!) 95/48  Pulse:   89 89  Resp:    16  Temp:    99 F (37.2 C)  TempSrc:    Oral  SpO2: 93% 93%  100%  Weight:      Height:        Intake/Output Summary (Last 24 hours) at 03/06/17 1631 Last data filed at 03/06/17 1300  Gross per 24 hour  Intake              480 ml  Output  600 ml  Net             -120 ml   Filed Weights   03/03/17 1759 03/04/17 0500 03/05/17 0500  Weight: 44.5 kg (98 lb 1.7 oz) 43.4 kg (95 lb 10.9 oz) 46.5 kg (102 lb 8.2 oz)    Examination:  General exam: Alert, awake, oriented x 3 Respiratory system: Clear to auscultation. Respiratory effort normal. Cardiovascular system:RRR. No murmurs, rubs, gallops. Gastrointestinal system: Abdomen is nondistended, soft and nontender. No organomegaly or masses felt. Normal bowel sounds heard. Central  nervous system: Alert and oriented. No focal neurological deficits. Extremities: No C/C/E, +pedal pulses Skin: No rashes, lesions or ulcers Psychiatry: Judgement and insight appear normal. Mood & affect appropriate.    Data Reviewed: I have personally reviewed following labs and imaging studies  CBC:  Recent Labs Lab 03/03/17 1308 03/04/17 0701 03/05/17 0503 03/06/17 0605  WBC 14.0* 19.9* 13.1* 11.5*  NEUTROABS 12.0*  --   --   --   HGB 8.0* 9.7* 8.1* 9.1*  HCT 26.8* 31.9* 26.4* 29.5*  MCV 94.4 93.8 94.0 91.9  PLT 213 264 222 101   Basic Metabolic Panel:  Recent Labs Lab 03/03/17 1308 03/04/17 0701 03/05/17 0503 03/06/17 0605  NA 144 138 138 135  K 2.7* 5.4* 3.9 3.7  CL 119* 107 111 107  CO2 18* 23 21* 23  GLUCOSE 335* 262* 214* 115*  BUN 19 22* 19 15  CREATININE 0.73 1.06* 0.82 0.77  CALCIUM 5.4* 8.3* 7.4* 7.8*  MG 1.3* 2.2  --   --    GFR: Estimated Creatinine Clearance: 35.7 mL/min (by C-G formula based on SCr of 0.77 mg/dL). Liver Function Tests: No results for input(s): AST, ALT, ALKPHOS, BILITOT, PROT, ALBUMIN in the last 168 hours. No results for input(s): LIPASE, AMYLASE in the last 168 hours. No results for input(s): AMMONIA in the last 168 hours. Coagulation Profile: No results for input(s): INR, PROTIME in the last 168 hours. Cardiac Enzymes: No results for input(s): CKTOTAL, CKMB, CKMBINDEX, TROPONINI in the last 168 hours. BNP (last 3 results) No results for input(s): PROBNP in the last 8760 hours. HbA1C: No results for input(s): HGBA1C in the last 72 hours. CBG:  Recent Labs Lab 03/05/17 2130 03/06/17 0013 03/06/17 0350 03/06/17 0810 03/06/17 1114  GLUCAP 266* 140* 100* 93 246*   Lipid Profile: No results for input(s): CHOL, HDL, LDLCALC, TRIG, CHOLHDL, LDLDIRECT in the last 72 hours. Thyroid Function Tests: No results for input(s): TSH, T4TOTAL, FREET4, T3FREE, THYROIDAB in the last 72 hours. Anemia Panel: No results for input(s):  VITAMINB12, FOLATE, FERRITIN, TIBC, IRON, RETICCTPCT in the last 72 hours. Sepsis Labs: No results for input(s): PROCALCITON, LATICACIDVEN in the last 168 hours.  Recent Results (from the past 240 hour(s))  Urine culture     Status: Abnormal   Collection Time: 03/03/17 11:58 AM  Result Value Ref Range Status   Specimen Description URINE, CATHETERIZED  Final   Special Requests NONE  Final   Culture 60,000 COLONIES/mL YEAST (A)  Final   Report Status 03/05/2017 FINAL  Final  MRSA PCR Screening     Status: None   Collection Time: 03/03/17  5:54 PM  Result Value Ref Range Status   MRSA by PCR NEGATIVE NEGATIVE Final    Comment:        The GeneXpert MRSA Assay (FDA approved for NASAL specimens only), is one component of a comprehensive MRSA colonization surveillance program. It is not intended to diagnose MRSA  infection nor to guide or monitor treatment for MRSA infections.          Radiology Studies: No results found.      Scheduled Meds: . atorvastatin  10 mg Oral q1800  . cholecalciferol  2,000 Units Oral Daily  . diltiazem  120 mg Oral Daily  . enoxaparin (LOVENOX) injection  30 mg Subcutaneous Q24H  . feeding supplement (GLUCERNA SHAKE)  237 mL Oral BID BM  . guaiFENesin  1,200 mg Oral BID  . insulin aspart  0-20 Units Subcutaneous TID WC  . insulin aspart  0-5 Units Subcutaneous QHS  . insulin aspart  4 Units Subcutaneous TID WC  . [START ON 03/07/2017] insulin glargine  10 Units Subcutaneous Daily  . insulin glargine  5 Units Subcutaneous Once  . ipratropium-albuterol  3 mL Nebulization BID  . magnesium oxide  200 mg Oral Daily  . metoprolol succinate  25 mg Oral Daily  . mometasone-formoterol  2 puff Inhalation BID  . multivitamin with minerals  1 tablet Oral q morning - 10a  . pantoprazole  40 mg Oral Daily  . predniSONE  10 mg Oral Q breakfast  . raloxifene  60 mg Oral q morning - 10a  . zinc oxide   Topical BID   Continuous Infusions: . levofloxacin  (LEVAQUIN) IV Stopped (03/04/17 2128)     LOS: 3 days    Time spent: 58mins    MEMON,JEHANZEB, MD [facility] Pager [phone]  If 7PM-7AM, please contact night-coverage [email or web address] Password [email or web address] 03/06/2017, 4:31 PM

## 2017-03-07 LAB — GLUCOSE, CAPILLARY
GLUCOSE-CAPILLARY: 158 mg/dL — AB (ref 65–99)
Glucose-Capillary: 74 mg/dL (ref 65–99)
Glucose-Capillary: 207 mg/dL — ABNORMAL HIGH (ref 65–99)

## 2017-03-07 MED ORDER — IPRATROPIUM-ALBUTEROL 0.5-2.5 (3) MG/3ML IN SOLN: 3.0000 mL | Freq: Two times a day (BID) | RESPIRATORY_TRACT | Status: AC

## 2017-03-07 MED ORDER — ZINC OXIDE 20 % EX OINT: TOPICAL_OINTMENT | Freq: Two times a day (BID) | CUTANEOUS | 0 refills | Status: DC

## 2017-03-07 MED ORDER — INSULIN ASPART 100 UNIT/ML FLEXPEN: 4.0000 [IU] | PEN_INJECTOR | Freq: Three times a day (TID) | SUBCUTANEOUS | 11 refills | Status: AC

## 2017-03-07 MED ORDER — GUAIFENESIN ER 600 MG PO TB12: 600.0000 mg | ORAL_TABLET | Freq: Two times a day (BID) | ORAL | 0 refills | Status: AC

## 2017-03-07 MED ORDER — INSULIN GLARGINE 100 UNIT/ML SOLOSTAR PEN: 5.0000 [IU] | PEN_INJECTOR | Freq: Every day | SUBCUTANEOUS | 11 refills | Status: AC

## 2017-03-07 MED ORDER — INSULIN GLARGINE 100 UNIT/ML ~~LOC~~ SOLN
5.0000 [IU] | Freq: Every day | SUBCUTANEOUS | Status: DC
  Administered 2017-03-07: 5 [IU] via SUBCUTANEOUS
  Filled 2017-03-07 (×3): qty 0.05

## 2017-03-07 MED ORDER — LEVOFLOXACIN 500 MG PO TABS: 500.0000 mg | ORAL_TABLET | Freq: Every day | ORAL | 0 refills | Status: DC

## 2017-03-07 MED ORDER — "PEN NEEDLES 5/16"" 30G X 8 MM MISC": 0 refills | Status: DC

## 2017-03-07 NOTE — Progress Notes (Signed)
Pharmacy Antibiotic Note  [name] is a 83 y.o. female admitted on 03/03/2017 now diagnosed with pneumonia.  Pharmacy has been consulted for Levaquin dosing.  CXR = Right upper lobe and bibasilar pulmonary infiltrates. Findings consistent with multifocal pneumonia. Afebrile, WBC improving. Swallow evaluation shows mild aspiration risk  Plan: Continue Levaquin 500mg  IV every 48 hours. Transition to po as indicated   Height: 5\' 2"  (157.5 cm) Weight: 102 lb 8.2 oz (46.5 kg) IBW/kg (Calculated) : 50.1  Temp (24hrs), Avg:98.7 F (37.1 C), Min:98.3 F (36.8 C), Max:99 F (37.2 C)   Recent Labs Lab 03/03/17 1308 03/04/17 0701 03/05/17 0503 03/06/17 0605  WBC 14.0* 19.9* 13.1* 11.5*  CREATININE 0.73 1.06* 0.82 0.77    Estimated Creatinine Clearance: 35.7 mL/min (by C-G formula based on SCr of 0.77 mg/dL).    Allergies  Allergen Reactions  . Aspirin Other (See Comments)    Cannot take uncoated due to stomach ulcers  . Megace [Megestrol]     abd pain   . Rocephin [Ceftriaxone Sodium In Dextrose] Swelling    Antimicrobials this admission:  Aztreonam 5/29 >>5/31 Vanc 5/30 >> 5/31 Levaquin 5/30 >>  Fluconazole 150mg  x 1 dose 5/31  Dose adjustments this admission:  n/a   Microbiology results:  5/29 UCx: yeast  5/29 MRSA PCR: (-)   Thank you for allowing pharmacy to be a part of this patient's care.  Isac Sarna, BS Pharm D, BCPS Clinical Pharmacist Pager [phone] 03/07/2017 10:00 AM

## 2017-03-07 NOTE — Progress Notes (Signed)
Discharge instructions reviewed with patient and her daughter at bedside. Given copy of AVS, verbalized understanding of instructions and follow-up with PCP. States they will call for appointment this week. Prescriptions called in to [facility] in [place], [place] per MD. Daughter aware. Verbalized understanding of checking blood sugars and states she has working CBG meter at home. Pt in stable condition awaiting discharge home after she finishes dinner. Donavan Foil, RN

## 2017-03-07 NOTE — Progress Notes (Signed)
Patient left floor in stable condition via w/c accompanied by her daughter and nurse tech. Discharged home. Donavan Foil, RN

## 2017-03-07 NOTE — Progress Notes (Signed)
MD requested nursing staff provide insulin administration education for patient's daughters. Discussed insulin administration with daughters at bedside, daughter stated "I"m a nurse" and reports no questions/concerns regarding giving patient insulin at home. Donavan Foil, RN

## 2017-03-07 NOTE — Discharge Summary (Addendum)
Physician Discharge Summary  [name] AVW:[id number] DOB: [birth year] DOA: 03/03/2017  PCP: Moshe Cipro, MD  Admit date: 03/03/2017 Discharge date: 03/07/2017  Admitted From: home Disposition:  home  Recommendations for Outpatient Follow-up:  1. Follow up with PCP in 1-2 weeks 2. Please obtain BMP/CBC in one week 3. Repeat chest x-ray in 4 weeks to ensure resolution of pneumonia.  Home Health: Patient is already set up with home health. Equipment/Devices:  Discharge Condition: Stable CODE STATUS: Full code Diet recommendation: Heart Healthy / Carb Modified   Brief/Interim Summary: 83 -year-old female with a history of advanced rheumatoid arthritis who is bedbound, is present the hospital by her family due to generalized weakness and elevated blood sugars. By mouth intake is poor for 2-3 days prior to admission. She was noted to be dehydrated with a possible urinary tract infection.   Discharge Diagnoses:  Principal Problem:   Hyperglycemia Active Problems:   Rheumatoid arthritis ([place])   HTN (hypertension)   Diabetes type 2, controlled ([place])   Breast cancer of upper-outer quadrant of left female breast ([place])   Asthma, chronic   Electrolyte abnormality   HCAP (healthcare-associated pneumonia)  1. Diabetes with hyperglycemia. She initially presented with hyperglycemia blood sugar 480. She was initially started on IV insulin with improvement of blood sugars and is now on sliding scale. She was previously on metformin, but on last discharge, this was changed to [place]. Family reports that her blood sugars have been running high at home since her last discharge. A1c noted to be 9.9. she was started on lantus/novolog, with fair control of her blood sugars. Further adjustments in his outpatient. Oral medications have been discontinued.. 2. HCAP. Chest xray showed right upper lobe and lower lobe pneumonia. She is being treated with Levaquin. Since she appears to have recurrent  pneumonia, she was seen by speech/swallow service. No change in her diet were recommended. Continue pulmonary hygiene. 3. SVT. Patient did have an episode of SVT in ED with heart rate in the 170s. She received 1 dose of adenosine with improvement of her heart rate. She is continued on Cardizem and Toprol and heart rate is stable. 4. Failure to thrive. Family reports that patient had been spitting out her food and refusing to eat prior to admission. This was likely related to underlying infection/dehydration. Since she has been better hydrated, family reports po intake is improving. 5. Rheumatoid arthritis. Continue home dose of chronic steroids. 6. Functional quadriplegia. Patient has been bedbound for quite some time now due to her severe/advanced rheumatoid arthritis. 7. Electrolyte abnormalities. Initially, potassium was noted to be low at 2.7 as was calcium. These have been replaced. 8. Leukocytosis. Related to PNA and dehydration. Improving. 9. Acute on chronic anemia. Suspect this is related to chronic disease. No evidence of bleeding. Continue to monitor.   Discharge Instructions  Discharge Instructions    Activity as tolerated - No restrictions    Complete by:  As directed    Diet - low sodium heart healthy    Complete by:  As directed      Allergies as of 03/07/2017      Reactions   Aspirin Other (See Comments)   Cannot take uncoated due to stomach ulcers   Megace [megestrol]    abd pain   Rocephin [ceftriaxone Sodium In Dextrose] Swelling      Medication List    STOP taking these medications   TRADJENTA 5 MG Tabs tablet Generic drug:  linagliptin     TAKE  these medications   aspirin EC 81 MG tablet Take 81 mg by mouth 2 (two) times daily.   atorvastatin 10 MG tablet Commonly known as:  LIPITOR Take 10 mg by mouth daily.   budesonide-formoterol 160-4.5 MCG/ACT inhaler Commonly known as:  SYMBICORT Inhale 2 puffs into the lungs 2 (two) times daily.   CENTRUM  SILVER ADULT 50+ PO Take 1 tablet by mouth every morning.   ciclopirox 8 % solution Commonly known as:  PENLAC APPLY TO FUNGAL TOENAILS EVERY DAY   CLEAR EYES ALL SEASONS 5-6 MG/ML Soln Generic drug:  Polyvinyl Alcohol-Povidone Apply 1 drop to eye every morning.   D-MANNOSE PO Take 2 capsules by mouth daily.   diltiazem 120 MG 24 hr capsule Commonly known as:  CARDIZEM CD Take 1 capsule (120 mg total) by mouth daily.   guaiFENesin 600 MG 12 hr tablet Commonly known as:  MUCINEX Take 1 tablet (600 mg total) by mouth 2 (two) times daily.   insulin aspart 100 UNIT/ML FlexPen Commonly known as:  NOVOLOG Inject 4 Units into the skin 3 (three) times daily with meals.   Insulin Glargine 100 UNIT/ML Solostar Pen Commonly known as:  LANTUS Inject 5 Units into the skin daily.   ipratropium-albuterol 0.5-2.5 (3) MG/3ML Soln Commonly known as:  DUONEB Take 3 mLs by nebulization 2 (two) times daily.   lactobacillus acidophilus & bulgar chewable tablet CHEW 1 TABLET BY MOUTH EVERY DAY   levofloxacin 500 MG tablet Commonly known as:  LEVAQUIN Take 1 tablet (500 mg total) by mouth daily.   magnesium oxide 400 (241.3 Mg) MG tablet Commonly known as:  MAG-OX Take 0.5 tablets (200 mg total) by mouth daily. What changed:  how much to take  additional instructions   metoprolol succinate 25 MG 24 hr tablet Commonly known as:  TOPROL-XL Take 1 tablet by mouth daily.   pantoprazole 40 MG tablet Commonly known as:  PROTONIX Take 40 mg by mouth daily.   Pen Needles 5/16" 30G X 8 MM Misc Use as directed, before meals and as needed   Polysacchar Iron-FA-B12 150-1-25 MG-MG-MCG Caps Commonly known as:  FERREX 150 FORTE Take 1 capsule by mouth daily.   predniSONE 5 MG tablet Commonly known as:  DELTASONE Take 2 tablets (10 mg total) by mouth daily with breakfast.   raloxifene 60 MG tablet Commonly known as:  EVISTA Take 60 mg by mouth every morning.   tamoxifen 20 MG  tablet Commonly known as:  NOLVADEX Take 1 tablet (20 mg total) by mouth daily. What changed:  See the new instructions.   Vitamin D3 1000 units Caps Take 2,000 Units by mouth daily.   zinc oxide 20 % ointment Apply topically 2 (two) times daily.       Allergies  Allergen Reactions  . Aspirin Other (See Comments)    Cannot take uncoated due to stomach ulcers  . Megace [Megestrol]     abd pain   . Rocephin [Ceftriaxone Sodium In Dextrose] Swelling    Consultations:     Procedures/Studies: Dg Chest Port 1 View  Result Date: 03/04/2017 CLINICAL DATA:  Leukocytosis. EXAM: PORTABLE CHEST 1 VIEW COMPARISON:  07/19/2016 . FINDINGS: Heart size normal. Right upper lobe and bibasilar pulmonary infiltrates. Interposition of the colon under the left hemidiaphragm again noted. Surgical clips left axilla. IMPRESSION: Right upper lobe and bibasilar pulmonary infiltrates. Findings consistent with multifocal pneumonia. Electronically Signed   By: Marcello Moores  Register   On: 03/04/2017 13:17  Subjective: Patient is feeling better today. No new complaints.  Discharge Exam: Vitals:   03/07/17 0648 03/07/17 1452  BP: (!) 114/56 (!) 102/50  Pulse: 86 95  Resp: 18 14  Temp: 98.3 F (36.8 C) 98.7 F (37.1 C)   Vitals:   03/07/17 0648 03/07/17 0735 03/07/17 0745 03/07/17 1452  BP: (!) 114/56   (!) 102/50  Pulse: 86   95  Resp: 18   14  Temp: 98.3 F (36.8 C)   98.7 F (37.1 C)  TempSrc: Oral   Oral  SpO2: 99% 97% 97% 100%  Weight:      Height:        General: Pt is alert, awake, not in acute distress Cardiovascular: RRR, S1/S2 +, no rubs, no gallops Respiratory: CTA bilaterally, no wheezing, no rhonchi Abdominal: Soft, NT, ND, bowel sounds + Extremities: no edema, no cyanosis    The results of significant diagnostics from this hospitalization (including imaging, microbiology, ancillary and laboratory) are listed below for reference.     Microbiology: Recent Results  (from the past 240 hour(s))  Urine culture     Status: Abnormal   Collection Time: 03/03/17 11:58 AM  Result Value Ref Range Status   Specimen Description URINE, CATHETERIZED  Final   Special Requests NONE  Final   Culture 60,000 COLONIES/mL YEAST (A)  Final   Report Status 03/05/2017 FINAL  Final  MRSA PCR Screening     Status: None   Collection Time: 03/03/17  5:54 PM  Result Value Ref Range Status   MRSA by PCR NEGATIVE NEGATIVE Final    Comment:        The GeneXpert MRSA Assay (FDA approved for NASAL specimens only), is one component of a comprehensive MRSA colonization surveillance program. It is not intended to diagnose MRSA infection nor to guide or monitor treatment for MRSA infections.      Labs: BNP (last 3 results) No results for input(s): BNP in the last 8760 hours. Basic Metabolic Panel:  Recent Labs Lab 03/03/17 1308 03/04/17 0701 03/05/17 0503 03/06/17 0605  NA 144 138 138 135  K 2.7* 5.4* 3.9 3.7  CL 119* 107 111 107  CO2 18* 23 21* 23  GLUCOSE 335* 262* 214* 115*  BUN 19 22* 19 15  CREATININE 0.73 1.06* 0.82 0.77  CALCIUM 5.4* 8.3* 7.4* 7.8*  MG 1.3* 2.2  --   --    Liver Function Tests: No results for input(s): AST, ALT, ALKPHOS, BILITOT, PROT, ALBUMIN in the last 168 hours. No results for input(s): LIPASE, AMYLASE in the last 168 hours. No results for input(s): AMMONIA in the last 168 hours. CBC:  Recent Labs Lab 03/03/17 1308 03/04/17 0701 03/05/17 0503 03/06/17 0605  WBC 14.0* 19.9* 13.1* 11.5*  NEUTROABS 12.0*  --   --   --   HGB 8.0* 9.7* 8.1* 9.1*  HCT 26.8* 31.9* 26.4* 29.5*  MCV 94.4 93.8 94.0 91.9  PLT 213 264 222 228   Cardiac Enzymes: No results for input(s): CKTOTAL, CKMB, CKMBINDEX, TROPONINI in the last 168 hours. BNP: Invalid input(s): POCBNP CBG:  Recent Labs Lab 03/06/17 1114 03/06/17 2112 03/07/17 0737 03/07/17 1136 03/07/17 1616  GLUCAP 246* 164* 74 207* 158*   D-Dimer No results for input(s): DDIMER  in the last 72 hours. Hgb A1c No results for input(s): HGBA1C in the last 72 hours. Lipid Profile No results for input(s): CHOL, HDL, LDLCALC, TRIG, CHOLHDL, LDLDIRECT in the last 72 hours. Thyroid function studies No results  for input(s): TSH, T4TOTAL, T3FREE, THYROIDAB in the last 72 hours.  Invalid input(s): FREET3 Anemia work up No results for input(s): VITAMINB12, FOLATE, FERRITIN, TIBC, IRON, RETICCTPCT in the last 72 hours. Urinalysis    Component Value Date/Time   COLORURINE YELLOW 03/03/2017 1158   APPEARANCEUR CLOUDY (A) 03/03/2017 1158   LABSPEC 1.023 03/03/2017 1158   PHURINE 6.0 03/03/2017 1158   GLUCOSEU >=500 (A) 03/03/2017 1158   HGBUR MODERATE (A) 03/03/2017 1158   BILIRUBINUR NEGATIVE 03/03/2017 1158   KETONESUR 5 (A) 03/03/2017 1158   PROTEINUR 30 (A) 03/03/2017 1158   UROBILINOGEN 0.2 06/30/2015 1317   NITRITE NEGATIVE 03/03/2017 1158   LEUKOCYTESUR LARGE (A) 03/03/2017 1158   Sepsis Labs Invalid input(s): PROCALCITONIN,  WBC,  LACTICIDVEN Microbiology Recent Results (from the past 240 hour(s))  Urine culture     Status: Abnormal   Collection Time: 03/03/17 11:58 AM  Result Value Ref Range Status   Specimen Description URINE, CATHETERIZED  Final   Special Requests NONE  Final   Culture 60,000 COLONIES/mL YEAST (A)  Final   Report Status 03/05/2017 FINAL  Final  MRSA PCR Screening     Status: None   Collection Time: 03/03/17  5:54 PM  Result Value Ref Range Status   MRSA by PCR NEGATIVE NEGATIVE Final    Comment:        The GeneXpert MRSA Assay (FDA approved for NASAL specimens only), is one component of a comprehensive MRSA colonization surveillance program. It is not intended to diagnose MRSA infection nor to guide or monitor treatment for MRSA infections.      Time coordinating discharge: Over 30 minutes  SIGNED:   Kathie Dike, MD  [facility] 03/07/2017, 4:47 PM Pager   If 7PM-7AM, please contact  night-coverage [email or web address] Password [email or web address]

## 2017-03-09 LAB — GLUCOSE, CAPILLARY: GLUCOSE-CAPILLARY: 246 mg/dL — AB (ref 65–99)

## 2017-03-18 ENCOUNTER — Other Ambulatory Visit (HOSPITAL_COMMUNITY): Payer: Self-pay

## 2017-03-18 DIAGNOSIS — C50412 Malignant neoplasm of upper-outer quadrant of left female breast: Secondary | ICD-10-CM

## 2017-03-18 MED ORDER — TAMOXIFEN CITRATE 20 MG PO TABS: 20.0000 mg | ORAL_TABLET | Freq: Every day | ORAL | 1 refills | Status: DC

## 2017-03-18 NOTE — Telephone Encounter (Signed)
Received refill request from patients pharmacy for 90-day supply of Tamoxifen. Reviewed with provider, chart checked and refilled.

## 2017-04-07 ENCOUNTER — Other Ambulatory Visit (HOSPITAL_COMMUNITY): Payer: Self-pay | Admitting: Oncology

## 2017-04-07 DIAGNOSIS — E611 Iron deficiency: Secondary | ICD-10-CM

## 2017-08-14 ENCOUNTER — Encounter (HOSPITAL_COMMUNITY): Payer: Self-pay | Admitting: Oncology

## 2017-08-14 ENCOUNTER — Encounter (HOSPITAL_COMMUNITY): Payer: Medicare Other | Attending: Oncology | Admitting: Oncology

## 2017-08-14 ENCOUNTER — Encounter (HOSPITAL_COMMUNITY): Payer: Medicare Other

## 2017-08-14 DIAGNOSIS — Z171 Estrogen receptor negative status [ER-]: Secondary | ICD-10-CM

## 2017-08-14 DIAGNOSIS — Z170 Estrogen receptor positive status [ER+]: Secondary | ICD-10-CM | POA: Diagnosis not present

## 2017-08-14 DIAGNOSIS — C50412 Malignant neoplasm of upper-outer quadrant of left female breast: Secondary | ICD-10-CM

## 2017-08-14 DIAGNOSIS — R739 Hyperglycemia, unspecified: Secondary | ICD-10-CM | POA: Diagnosis not present

## 2017-08-14 DIAGNOSIS — Z79811 Long term (current) use of aromatase inhibitors: Secondary | ICD-10-CM | POA: Diagnosis not present

## 2017-08-14 LAB — COMPREHENSIVE METABOLIC PANEL
ALBUMIN: 2.5 g/dL — AB (ref 3.5–5.0)
ALT: 13 U/L — AB (ref 14–54)
AST: 21 U/L (ref 15–41)
Alkaline Phosphatase: 64 U/L (ref 38–126)
Anion gap: 6 (ref 5–15)
BUN: 14 mg/dL (ref 6–20)
CO2: 28 mmol/L (ref 22–32)
CREATININE: 1.03 mg/dL — AB (ref 0.44–1.00)
Calcium: 8.5 mg/dL — ABNORMAL LOW (ref 8.9–10.3)
Chloride: 98 mmol/L — ABNORMAL LOW (ref 101–111)
GFR calc non Af Amer: 47 mL/min — ABNORMAL LOW (ref >60)
GFR, EST AFRICAN AMERICAN: 54 mL/min — AB (ref >60)
Glucose, Bld: 408 mg/dL — ABNORMAL HIGH (ref 65–99)
Potassium: 4.8 mmol/L (ref 3.5–5.1)
Sodium: 132 mmol/L — ABNORMAL LOW (ref 135–145)
Total Bilirubin: 0.3 mg/dL (ref 0.3–1.2)
Total Protein: 5.9 g/dL — ABNORMAL LOW (ref 6.5–8.1)

## 2017-08-14 LAB — CBC WITH DIFFERENTIAL/PLATELET
BASOS ABS: 0 10*3/uL (ref 0.0–0.1)
BASOS PCT: 0 %
EOS ABS: 0.1 10*3/uL (ref 0.0–0.7)
EOS PCT: 1 %
HCT: 33.9 % — ABNORMAL LOW (ref 36.0–46.0)
Hemoglobin: 10.6 g/dL — ABNORMAL LOW (ref 12.0–15.0)
LYMPHS ABS: 1.5 10*3/uL (ref 0.7–4.0)
Lymphocytes Relative: 9 %
MCH: 28.8 pg (ref 26.0–34.0)
MCHC: 31.3 g/dL (ref 30.0–36.0)
MCV: 92.1 fL (ref 78.0–100.0)
Monocytes Absolute: 0.6 10*3/uL (ref 0.1–1.0)
Monocytes Relative: 4 %
Neutro Abs: 14.6 10*3/uL — ABNORMAL HIGH (ref 1.7–7.7)
Neutrophils Relative %: 86 %
PLATELETS: 249 10*3/uL (ref 150–400)
RBC: 3.68 MIL/uL — AB (ref 3.87–5.11)
RDW: 13.5 % (ref 11.5–15.5)
WBC: 16.9 10*3/uL — AB (ref 4.0–10.5)

## 2017-08-14 MED ORDER — TAMOXIFEN CITRATE 20 MG PO TABS: 20.0000 mg | ORAL_TABLET | Freq: Every day | ORAL | 1 refills | Status: DC

## 2017-08-14 NOTE — Patient Instructions (Signed)
[facility] Discharge Instructions  RECOMMENDATIONS MADE BY THE CONSULTANT AND ANY TEST RESULTS WILL BE SENT TO YOUR REFERRING PHYSICIAN.  Seen by Dr. Jeb Levering Follow up in about 6 months and labs  Thank you for choosing [facility] to provide your oncology and hematology care.  To afford each patient quality time with our provider, please arrive at least 15 minutes before your scheduled appointment time.    If you have a lab appointment with the [facility] please come in thru the  Main Entrance and check in at the main information desk  You need to re-schedule your appointment should you arrive 10 or more minutes late.  We strive to give you quality time with our providers, and arriving late affects you and other patients whose appointments are after yours.  Also, if you no show three or more times for appointments you may be dismissed from the clinic at the providers discretion.     Again, thank you for choosing [facility].  Our hope is that these requests will decrease the amount of time that you wait before being seen by our physicians.       _____________________________________________________________  Should you have questions after your visit to [facility], please contact our office at [phone] between the hours of 8:30 a.m. and 4:30 p.m.  Voicemails left after 4:30 p.m. will not be returned until the following business day.  For prescription refill requests, have your pharmacy contact our office.       Resources For Cancer Patients and their Caregivers ? American Cancer Society: Can assist with transportation, wigs, general needs, runs Look Good Feel Better.        717-807-3367 ? Cancer Care: Provides financial assistance, online support groups, medication/co-pay assistance.  1-800-813-HOPE (518)323-7674) ? [facility] Assists [place] cancer  patients and their families through emotional , educational and financial support.  289-887-2117 ? [place] DSS Where to apply for food stamps, Medicaid and utility assistance. (727)232-9034 ? [facility]: Transportation to medical appointments. (781) 237-0396 ? Social Security Administration: May apply for disability if have a Stage IV cancer. 867 814 7159 919-798-1393 ? [employer], Disability and Transit Services: Assists with nutrition, care and transit needs. [facility] Support Programs: @10RELATIVEDAYS @ > Cancer Support Group  2nd Tuesday of the month 1pm-2pm, Journey Room  > Creative Journey  3rd Tuesday of the month 1130am-1pm, Journey Room  > Look Good Feel Better  1st Wednesday of the month 10am-12 noon, Journey Room (Call [employer] to register [phone])

## 2017-08-14 NOTE — Progress Notes (Signed)
Patient left before anyone could let family know her blood sugar is over 400. It is 408 to be exact. Called and left message on daughter, Jaynie Bream, phone. Explaining that patient's sugar is elevated and the family needs to be sure patient gets her diabetic medication. If her sugar stays elevated they need to call her PCP or go to the ER.

## 2017-08-14 NOTE — Progress Notes (Signed)
[facility] NOTE  Patient Care Team: Moshe Cipro, MD as PCP - General (Internal Medicine)  CHIEF COMPLAINTS/PURPOSE OF CONSULTATION:  Left Breast Cancer Biopsy on 03/20/2015 with ER- PR+(1% with strong staining intensity) HER2 neu- invasive ductal carcinoma pT2, pN0 Mastectomy on 05/09/2015 Patient had a bilateral mastectomy  HISTORY OF PRESENTING ILLNESS:  [name] 83 y.o. female is here for follow-up of L breast cancer s/p mastectomy, she has ER-, PR+ disease. She is currently on Tamoxifen.   She has had a hysterectomy. [name] returns to the [employer] with several family members. She is confined to a wheelchair due to her significant kyphosis.  [name] is accompanied by two of her daughters. Patient presents in wheelchair. I personally reviewed and went over laboratory studies with the patient and her family.  Since our last visit, she was hospitalized twice with community acquired pneumonia.  She has not had any issues with the Tamoxifen. She continues to take this regularly.  She reports some hot flashes, but these do not wake her at night. The patient denies any vaginal bleeding or spotting. She denies chest pain, SOB, or abdominal pain. The patient is not ambulatory at home, and stays in her wheelchair most of the day. Denies lower extremity edema.  The patient reports she has not been following with routine yearly vision screenings recently.  MEDICAL HISTORY:  Past Medical History:  Diagnosis Date  . Acid reflux   . Anemia   . Arthritis    ra  . Asthma   . Cancer [facility])    breast cancer  . Coronary artery disease   . Diabetes mellitus without complication ([place])   . Diverticulitis   . Gastric ulcer   . Hypertension   . Hypoxemia   . Kyphosis   . Kyphosis   . Mitral valve disorder   . Osteoporosis   . Oxygen dependent    2 liters at night  . Raynaud disease   . Thrombocytopenia ([place])     SURGICAL  HISTORY: Past Surgical History:  Procedure Laterality Date  . BREAST SURGERY    . HERNIA REPAIR    . MASTECTOMY Right 1978    SOCIAL HISTORY: Social History   Socioeconomic History  . Marital status: Widowed    Spouse name: Not on file  . Number of children: Not on file  . Years of education: Not on file  . Highest education level: Not on file  Social Needs  . Financial resource strain: Not on file  . Food insecurity - worry: Not on file  . Food insecurity - inability: Not on file  . Transportation needs - medical: Not on file  . Transportation needs - non-medical: Not on file  Occupational History  . Not on file  Tobacco Use  . Smoking status: Never Smoker  . Smokeless tobacco: Never Used  Substance and Sexual Activity  . Alcohol use: No  . Drug use: No  . Sexual activity: No  Other Topics Concern  . Not on file  Social History Narrative  . Not on file  4 daughters 8 grandchildren 50 great grandchildren Widowed Ex-cleaner Non smoker ETOH, none   FAMILY HISTORY: Family History  Problem Relation Age of Onset  . Asthma Sister   . Rheum arthritis Daughter    indicated that the status of her sister is unknown. She indicated that the status of her daughter is unknown.  Father deceased, 41 Mother deceased, young age  All sisters deceased 2 half-sisters alive 100 brothers   ALLERGIES:  is allergic to aspirin; megace [megestrol]; and rocephin [ceftriaxone sodium in dextrose].  MEDICATIONS:  Current Outpatient Medications  Medication Sig Dispense Refill  . aspirin EC 81 MG tablet Take 81 mg by mouth 2 (two) times daily.     Marland Kitchen atorvastatin (LIPITOR) 10 MG tablet Take 10 mg by mouth daily.    . budesonide-formoterol (SYMBICORT) 160-4.5 MCG/ACT inhaler Inhale 2 puffs into the lungs 2 (two) times daily.    . Cholecalciferol (VITAMIN D3) 1000 UNITS CAPS Take 2,000 Units by mouth daily.     . ciclopirox (PENLAC) 8 % solution APPLY TO FUNGAL TOENAILS EVERY DAY  1  .  D-MANNOSE PO Take 2 capsules by mouth daily.    Marland Kitchen diltiazem (CARDIZEM CD) 120 MG 24 hr capsule Take 1 capsule (120 mg total) by mouth daily. 30 capsule 2  . guaiFENesin (MUCINEX) 600 MG 12 hr tablet Take 1 tablet (600 mg total) by mouth 2 (two) times daily. 30 tablet 0  . insulin aspart (NOVOLOG) 100 UNIT/ML FlexPen Inject 4 Units into the skin 3 (three) times daily with meals. 15 mL 11  . Insulin Glargine (LANTUS) 100 UNIT/ML Solostar Pen Inject 5 Units into the skin daily. 15 mL 11  . Insulin Pen Needle (PEN NEEDLES 5/16") 30G X 8 MM MISC Use as directed, before meals and as needed 100 each 0  . ipratropium-albuterol (DUONEB) 0.5-2.5 (3) MG/3ML SOLN Take 3 mLs by nebulization 2 (two) times daily. 360 mL   . lactobacillus acidophilus & bulgar (LACTINEX) chewable tablet CHEW 1 TABLET BY MOUTH EVERY DAY  6  . levofloxacin (LEVAQUIN) 500 MG tablet Take 1 tablet (500 mg total) by mouth daily. 3 tablet 0  . magnesium oxide (MAG-OX) 400 (241.3 Mg) MG tablet Take 0.5 tablets (200 mg total) by mouth daily. (Patient taking differently: Take 200-400 mg by mouth daily. Alternating 200 mg one day then 400 mg the next.) 30 tablet 1  . metoprolol succinate (TOPROL-XL) 25 MG 24 hr tablet Take 1 tablet by mouth daily.  6  . Multiple Vitamins-Minerals (CENTRUM SILVER ADULT 50+ PO) Take 1 tablet by mouth every morning.     . pantoprazole (PROTONIX) 40 MG tablet Take 40 mg by mouth daily.    Marland Kitchen POLY-IRON 150 FORTE 150-25-1 MG-MCG-MG CAPS TAKE ONE CAPSULE BY MOUTH DAILY 30 each 5  . Polyvinyl Alcohol-Povidone (CLEAR EYES ALL SEASONS) 5-6 MG/ML SOLN Apply 1 drop to eye every morning.     . predniSONE (DELTASONE) 5 MG tablet Take 2 tablets (10 mg total) by mouth daily with breakfast. 10 tablet 0  . tamoxifen (NOLVADEX) 20 MG tablet Take 1 tablet (20 mg total) daily by mouth. 90 tablet 1  . zinc oxide 20 % ointment Apply topically 2 (two) times daily. 56.7 g 0   No current facility-administered medications for this  visit.     Review of Systems  Constitutional: Negative.   HENT: Negative.   Eyes: Negative.   Respiratory: Negative.   Cardiovascular: Negative.   Gastrointestinal: Negative.   Genitourinary: Negative.   Musculoskeletal: Negative.   Skin: Negative.   Neurological: Negative.   Endo/Heme/Allergies: Negative.   Psychiatric/Behavioral: Negative.   All other systems reviewed and are negative.    PHYSICAL EXAMINATION: ECOG PERFORMANCE STATUS: 2 - Symptomatic, <50% confined to bed  Vitals with BMI 02/11/2017  Height   Weight 106 lbs  BMI   Systolic 637  Diastolic 60  Pulse  107  Respirations 18    Physical Exam  Constitutional: She is oriented to person, place, and time. She appears well-developed and well-nourished.  Severe kyphosis, sitting in wheelchair.  Cardiovascular: Normal rate, regular rhythm and normal heart sounds. Exam reveals no gallop and no friction rub.  No murmur heard. Pulmonary/Chest: Effort normal and breath sounds normal. No respiratory distress. She has no wheezes. She has no rales. She exhibits no tenderness. Right breast exhibits no mass, no skin change and no tenderness. Left breast exhibits no mass, no skin change and no tenderness.  Abdominal: Soft. Bowel sounds are normal. She exhibits no distension and no mass. There is no tenderness. There is no rebound and no guarding.  Genitourinary: No breast tenderness.  Musculoskeletal: She exhibits no edema.  Severe kyphosis  Neurological: She is alert and oriented to person, place, and time.  Skin: Skin is warm and dry. No erythema.  Psychiatric: She has a normal mood and affect. Her behavior is normal. Judgment and thought content normal.    Bilateral mast no subcut nodules no ax adenopathy  LABORATORY DATA:  I have reviewed the data as listed                                   NAL DIAGNOSIS Diagnosis 1. Breast, simple mastectomy, Left - INVASIVE GRADE III DUCTAL CARCINOMA, SPANNING 4.8 CM IN  GREATEST DIMENSION. - MARGINS ARE NEGATIVE. - SEE ONCOLOGY TEMPLATE. 2. Lymph nodes, regional resection, Left axillary contents - FIVE BENIGN LYMPH NODES WITH NO TUMOR SEEN (0/5).   BREAST, INVASIVE TUMOR, WITH LYMPH NODES PRESENT Specimen, including laterality and lymph node sampling (sentinel, non-sentinel): Left breast with left axillary contents. Procedure: Left mastectomy with left axillary excision. Histologic type: Invasive ductal carcinoma. Grade: 3. Tubule formation: 3. Nuclear pleomorphism: 2. Mitotic: 3. Tumor size (gross measurement): 4.8 cm. Margins: Invasive, distance to closest margin: 0.2 cm (deep margin). Lymphovascular invasion: Definitive lymph/vascular invasion is not identified on the current specimen; however, lymph/vascular invasion was reported on the previous needle core biopsy ([id number]). Ductal carcinoma in situ: Not identified. Lobular neoplasia: Not identified. Tumor focality: Unifocal. Treatment effect: Not applicable. Extent of tumor: Skin: Tumor directly extends into dermis. Nipple: Not involved. Skeletal muscle: Not received. Lymph nodes: Examined: 0 Sentinel. 5 Non-sentinel. 5 Total. Lymph nodes with metastasis: 0. Isolated tumor cells (< 0.2 mm): 0. 2 of 4 FINAL for [facility], Aishah ([id number]) Microscopic Comment(continued) Micrometastasis: (> 0.2 mm and < 2.0 mm): 0. Macrometastasis: (> 2.0 mm): 0. Extracapsular extension: Not applicable. Breast prognostic profile: Performed on previous case [id number] Estrogen receptor: 0%, negative. Progesterone receptor: 1%, positive. Her 2 neu: 1.54 ratio, negative. Ki-67: 30%. Non-neoplastic breast: Unremarkable. TNM: pT2, pN0. Comments: As both Her-2 neu and estrogen receptor were previously reported as negative, a quantitative estrogen receptor and a Her-2 neu will be repeated on representative tumor to be reported in an addendum to follow. (RH:kh 05-11-15) Willeen Niece  MD Pathologist, Electronic Signature (Case signed 05/11/2015) Specimen Gross and Clinical Information Breast prognostic profile: Performed on previous case [id number] Estrogen receptor: 0%, negative. Progesterone receptor: 1%, positive. Her 2 neu: 1.54 ratio, negative. Ki-67: 30%. Non-neoplastic breast: Unremarkable. TNM: pT2, pN0. Comments: As both Her-2 neu and estrogen receptor were previously reported as negative, a quantitative estrogen receptor and a Her-2 neu will be repeated on representative tumor to be reported in an addendum to follow. 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 - Continue Tamoxifen.  - RTC in 6 months for follow up. - Patient will continue following with her PCP and routine vision screenings as indicated.

## 2017-10-07 ENCOUNTER — Inpatient Hospital Stay (HOSPITAL_COMMUNITY): Payer: Medicare Other | Attending: Oncology | Admitting: Oncology

## 2017-10-07 ENCOUNTER — Encounter (HOSPITAL_COMMUNITY): Payer: Self-pay | Admitting: Oncology

## 2017-10-07 ENCOUNTER — Ambulatory Visit (HOSPITAL_COMMUNITY)
Admission: RE | Admit: 2017-10-07 | Discharge: 2017-10-07 | Disposition: A | Discharge status: Home / Self Care | Payer: Medicare Other | Source: Ambulatory Visit | Attending: Oncology | Admitting: Oncology

## 2017-10-07 ENCOUNTER — Other Ambulatory Visit: Payer: Self-pay

## 2017-10-07 ENCOUNTER — Telehealth (HOSPITAL_COMMUNITY): Payer: Self-pay | Admitting: Emergency Medicine

## 2017-10-07 VITALS — BP 96/64 | HR 88 | Temp 99.3°F | Resp 16 | Ht 62.0 in | Wt 130.0 lb

## 2017-10-07 DIAGNOSIS — Z79811 Long term (current) use of aromatase inhibitors: Secondary | ICD-10-CM

## 2017-10-07 DIAGNOSIS — Z170 Estrogen receptor positive status [ER+]: Secondary | ICD-10-CM | POA: Diagnosis not present

## 2017-10-07 DIAGNOSIS — R2231 Localized swelling, mass and lump, right upper limb: Secondary | ICD-10-CM | POA: Insufficient documentation

## 2017-10-07 DIAGNOSIS — C50412 Malignant neoplasm of upper-outer quadrant of left female breast: Secondary | ICD-10-CM | POA: Diagnosis present

## 2017-10-07 NOTE — Progress Notes (Signed)
[facility] NOTE  Patient Care Team: Moshe Cipro, MD as PCP - General (Internal Medicine)  CHIEF COMPLAINTS/PURPOSE OF CONSULTATION:  Left Breast Cancer Biopsy on 03/20/2015 with ER- PR+(1% with strong staining intensity) HER2 neu- invasive ductal carcinoma pT2, pN0 Mastectomy on 05/09/2015 Patient had a bilateral mastectomy  HISTORY OF PRESENTING ILLNESS:  [name] 84 y.o. female is here for follow-up of L breast cancer s/p mastectomy, she has ER-, PR+ disease. She is currently on Tamoxifen.   She has had a hysterectomy. [name] is accompanied by her daughter. Patient presents in wheelchair.   Patient was last seen by Dr. Paulino Door on 08/14/2017.  Prior to that she had been hospitalized twice for pneumonia.  Does not appear patient has been hospitalized within several months.  She has not had any issues with the Tamoxifen. She continues to take this regularly.  She reports no hot flashes.  She denies any recent chest pain or abdominal pain.  She has occasional shortness of breath with exertion but mainly stays in her wheelchair.  She denies any lower extremity edema.  Approximately 2 weeks ago patient's daughter noticed a small "mass" underneath her right arm while bathing.  This is new.  She has watched it and has not noted any growth.  Patient does not complain of pain or that the mass is uncomfortable.  She denies any additional symptoms.  Specifically, she denies shortness of breath, chest pain, constipation, diarrhea or urinary symptoms.   MEDICAL HISTORY:  Past Medical History:  Diagnosis Date  . Acid reflux   . Anemia   . Arthritis    ra  . Asthma   . Cancer [facility])    breast cancer  . Coronary artery disease   . Diabetes mellitus without complication ([place])   . Diverticulitis   . Gastric ulcer   . Hypertension   . Hypoxemia   . Kyphosis   . Kyphosis   . Mitral valve disorder   . Osteoporosis   . Oxygen dependent    2  liters at night  . Raynaud disease   . Thrombocytopenia ([place])     SURGICAL HISTORY: Past Surgical History:  Procedure Laterality Date  . BREAST SURGERY    . HERNIA REPAIR    . MASTECTOMY Right 1978  . SIMPLE MASTECTOMY WITH AXILLARY SENTINEL NODE BIOPSY Left 05/09/2015   Procedure: LEFT SIMPLE MASTECTOMY;  Surgeon: Erroll Luna, MD;  Location: [facility];  Service: General;  Laterality: Left;    SOCIAL HISTORY: Social History   Socioeconomic History  . Marital status: Widowed    Spouse name: Not on file  . Number of children: Not on file  . Years of education: Not on file  . Highest education level: Not on file  Social Needs  . Financial resource strain: Not on file  . Food insecurity - worry: Not on file  . Food insecurity - inability: Not on file  . Transportation needs - medical: Not on file  . Transportation needs - non-medical: Not on file  Occupational History  . Not on file  Tobacco Use  . Smoking status: Never Smoker  . Smokeless tobacco: Never Used  Substance and Sexual Activity  . Alcohol use: No  . Drug use: No  . Sexual activity: No  Other Topics Concern  . Not on file  Social History Narrative  . Not on file  4 daughters 8 grandchildren 54 great grandchildren Widowed Ex-cleaner Non smoker ETOH, none  FAMILY HISTORY: Family History  Problem Relation Age of Onset  . Asthma Sister   . Rheum arthritis Daughter    indicated that the status of her sister is unknown. She indicated that the status of her daughter is unknown.  Father deceased, 36 Mother deceased, young age 58 sisters deceased 2 half-sisters alive 42 brothers   ALLERGIES:  is allergic to aspirin; megace [megestrol]; and rocephin [ceftriaxone sodium in dextrose].  MEDICATIONS:  Current Outpatient Medications  Medication Sig Dispense Refill  . aspirin EC 81 MG tablet Take 81 mg by mouth 2 (two) times daily.     Marland Kitchen atorvastatin (LIPITOR) 10 MG tablet Take 10 mg by mouth daily.    .  budesonide-formoterol (SYMBICORT) 160-4.5 MCG/ACT inhaler Inhale 2 puffs into the lungs 2 (two) times daily.    . Cholecalciferol (VITAMIN D3) 1000 UNITS CAPS Take 2,000 Units by mouth daily.     . ciclopirox (PENLAC) 8 % solution APPLY TO FUNGAL TOENAILS EVERY DAY  1  . D-MANNOSE PO Take 2 capsules by mouth daily.    Marland Kitchen diltiazem (CARDIZEM CD) 120 MG 24 hr capsule Take 1 capsule (120 mg total) by mouth daily. 30 capsule 2  . insulin aspart (NOVOLOG) 100 UNIT/ML FlexPen Inject 4 Units into the skin 3 (three) times daily with meals. 15 mL 11  . Insulin Glargine (LANTUS) 100 UNIT/ML Solostar Pen Inject 5 Units into the skin daily. 15 mL 11  . Insulin Pen Needle (PEN NEEDLES 5/16") 30G X 8 MM MISC Use as directed, before meals and as needed 100 each 0  . ipratropium-albuterol (DUONEB) 0.5-2.5 (3) MG/3ML SOLN Take 3 mLs by nebulization 2 (two) times daily. 360 mL   . lactobacillus acidophilus & bulgar (LACTINEX) chewable tablet CHEW 1 TABLET BY MOUTH EVERY DAY  6  . magnesium oxide (MAG-OX) 400 (241.3 Mg) MG tablet Take 0.5 tablets (200 mg total) by mouth daily. (Patient taking differently: Take 200-400 mg by mouth daily. Alternating 200 mg one day then 400 mg the next.) 30 tablet 1  . metoprolol succinate (TOPROL-XL) 25 MG 24 hr tablet Take 1 tablet by mouth daily.  6  . Multiple Vitamins-Minerals (CENTRUM SILVER ADULT 50+ PO) Take 1 tablet by mouth every morning.     . pantoprazole (PROTONIX) 40 MG tablet Take 40 mg by mouth daily.    Marland Kitchen POLY-IRON 150 FORTE 150-25-1 MG-MCG-MG CAPS TAKE ONE CAPSULE BY MOUTH DAILY 30 each 5  . Polyvinyl Alcohol-Povidone (CLEAR EYES ALL SEASONS) 5-6 MG/ML SOLN Apply 1 drop to eye every morning.     . predniSONE (DELTASONE) 5 MG tablet Take 2 tablets (10 mg total) by mouth daily with breakfast. 10 tablet 0  . tamoxifen (NOLVADEX) 20 MG tablet Take 1 tablet (20 mg total) daily by mouth. 90 tablet 1  . zinc oxide 20 % ointment Apply topically 2 (two) times daily. 56.7 g 0    . guaiFENesin (MUCINEX) 600 MG 12 hr tablet Take 1 tablet (600 mg total) by mouth 2 (two) times daily. (Patient not taking: Reported on 10/07/2017) 30 tablet 0   No current facility-administered medications for this visit.     Review of Systems  Constitutional: Negative.   HENT: Negative.   Eyes: Negative.   Respiratory: Negative.   Cardiovascular: Negative.   Gastrointestinal: Negative.   Genitourinary: Negative.   Musculoskeletal: Negative.        Small mass under right arm per daughter  Skin: Negative.   Neurological: Negative.   Endo/Heme/Allergies: Negative.  Psychiatric/Behavioral: Negative.   All other systems reviewed and are negative.    PHYSICAL EXAMINATION: ECOG PERFORMANCE STATUS: 2 - Symptomatic, <50% confined to bed  Vitals with BMI 02/11/2017  Height   Weight 106 lbs  BMI   Systolic 595  Diastolic 60  Pulse 638  Respirations 18    Physical Exam  Constitutional: She is oriented to person, place, and time. She appears well-developed and well-nourished.  Severe kyphosis, sitting in wheelchair.  Cardiovascular: Normal rate, regular rhythm and normal heart sounds. Exam reveals no gallop and no friction rub.  No murmur heard. Pulmonary/Chest: Effort normal and breath sounds normal. No respiratory distress. She has no wheezes. She has no rales. She exhibits no tenderness. Right breast exhibits no mass, no skin change and no tenderness. Left breast exhibits no mass, no skin change and no tenderness.  Abdominal: Soft. Bowel sounds are normal. She exhibits no distension and no mass. There is no tenderness. There is no rebound and no guarding.  Genitourinary: No breast tenderness.  Musculoskeletal: She exhibits no edema.  Severe kyphosis  Lymphadenopathy:    She has no axillary adenopathy.       Right axillary: No lateral adenopathy present.  No adenopathy present.  Small 2 cm nonmovable mass noted 2-3 cm from right axilla.  This is not painful to patient.   Neurological: She is alert and oriented to person, place, and time.  Skin: Skin is warm and dry. No erythema.  Psychiatric: She has a normal mood and affect. Her behavior is normal. Judgment and thought content normal.    Bilateral mast no subcut nodules no ax adenopathy  LABORATORY DATA:  I have reviewed the data as listed                                   NAL DIAGNOSIS Diagnosis 1. Breast, simple mastectomy, Left - INVASIVE GRADE III DUCTAL CARCINOMA, SPANNING 4.8 CM IN GREATEST DIMENSION. - MARGINS ARE NEGATIVE. - SEE ONCOLOGY TEMPLATE. 2. Lymph nodes, regional resection, Left axillary contents - FIVE BENIGN LYMPH NODES WITH NO TUMOR SEEN (0/5).   BREAST, INVASIVE TUMOR, WITH LYMPH NODES PRESENT Specimen, including laterality and lymph node sampling (sentinel, non-sentinel): Left breast with left axillary contents. Procedure: Left mastectomy with left axillary excision. Histologic type: Invasive ductal carcinoma. Grade: 3. Tubule formation: 3. Nuclear pleomorphism: 2. Mitotic: 3. Tumor size (gross measurement): 4.8 cm. Margins: Invasive, distance to closest margin: 0.2 cm (deep margin). Lymphovascular invasion: Definitive lymph/vascular invasion is not identified on the current specimen; however, lymph/vascular invasion was reported on the previous needle core biopsy ([id number]). Ductal carcinoma in situ: Not identified. Lobular neoplasia: Not identified. Tumor focality: Unifocal. Treatment effect: Not applicable. Extent of tumor: Skin: Tumor directly extends into dermis. Nipple: Not involved. Skeletal muscle: Not received. Lymph nodes: Examined: 0 Sentinel. 5 Non-sentinel. 5 Total. Lymph nodes with metastasis: 0. Isolated tumor cells (< 0.2 mm): 0. 2 of 4 FINAL for [facility], [name] ([id number]) Microscopic Comment(continued) Micrometastasis: (> 0.2 mm and < 2.0 mm): 0. Macrometastasis: (> 2.0 mm): 0. Extracapsular extension: Not  applicable. Breast prognostic profile: Performed on previous case [id number] Estrogen receptor: 0%, negative. Progesterone receptor: 1%, positive. Her 2 neu: 1.54 ratio, negative. Ki-67: 30%. Non-neoplastic breast: Unremarkable. TNM: pT2, pN0. Comments: As both Her-2 neu and estrogen receptor were previously reported as negative, a quantitative estrogen receptor and a Her-2 neu will be repeated on representative tumor to be reported  in an addendum to follow. (RH:kh 05-11-15) Willeen Niece MD Pathologist, Electronic Signature (Case signed 05/11/2015) Specimen Gross and Clinical Information Breast prognostic profile: Performed on previous case [id number] Estrogen receptor: 0%, negative. Progesterone receptor: 1%, positive. Her 2 neu: 1.54 ratio, negative. Ki-67: 30%. Non-neoplastic breast: Unremarkable. TNM: pT2, pN0. Comments: As both Her-2 neu and estrogen receptor were previously reported as negative, a quantitative estrogen receptor and a Her-2 neu will be repeated on representative tumor to be reported in an addendum to follow. 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     PLAN: - STAT right ultrasound limited of right breast including axilla. Results: No solid or cystic mass, distortion, abnormal shadowing or enlarged lymph nodes.  No suspicious findings within the right axilla. Patient called with results.  - Continue Tamoxifen.  - RTC as scheduled for follow-up per previous visit. Approximately April 2019.

## 2017-10-07 NOTE — Telephone Encounter (Signed)
pts daughter Blanch Media called and stated that last Sunday during pts bath she found a knot under pts right arm.  Area is red around the knot.  Spoke with Faythe Casa NP and she wants to see the pt when we have an available appt.  Spoke with daughter to see if she could bring the pt at 2 pm today and she is able to bring her today at 2 pm.  Faythe Casa NP made aware she will see pt today.

## 2017-10-12 ENCOUNTER — Telehealth (HOSPITAL_COMMUNITY): Payer: Self-pay | Admitting: *Deleted

## 2017-10-12 NOTE — Telephone Encounter (Signed)
Spoke with Blanch, pt's daughter, and results of breast ultrasound were given. She verbalizes understanding. She is aware of her mother's follow up appointment in early May.

## 2017-10-12 NOTE — Telephone Encounter (Signed)
-----   Message from Jacquelin Hawking, NP sent at 10/12/2017 12:49 PM EST ----- I am not sure if this patient was called to be informed of results of her right breast ultrasound.  With somebody mind calling and letting them know there is no evidence of a mass or adenopathy.  She will need to continue her tamoxifen and follow back up with new MD in April.  They are working on appointment for her.  Thanks  Sonia Baller

## 2018-01-05 ENCOUNTER — Encounter (HOSPITAL_COMMUNITY): Payer: Self-pay | Admitting: Emergency Medicine

## 2018-01-05 ENCOUNTER — Emergency Department (HOSPITAL_COMMUNITY): Payer: Medicare Other

## 2018-01-05 ENCOUNTER — Other Ambulatory Visit: Payer: Self-pay

## 2018-01-05 ENCOUNTER — Inpatient Hospital Stay (HOSPITAL_COMMUNITY)
Admission: EM | Admit: 2018-01-05 | Discharge: 2018-01-11 | DRG: 871 | Disposition: A | Discharge status: Home / Self Care | Payer: Medicare Other | Attending: Family Medicine | Admitting: Family Medicine

## 2018-01-05 DIAGNOSIS — L89152 Pressure ulcer of sacral region, stage 2: Secondary | ICD-10-CM | POA: Diagnosis present

## 2018-01-05 DIAGNOSIS — Z825 Family history of asthma and other chronic lower respiratory diseases: Secondary | ICD-10-CM

## 2018-01-05 DIAGNOSIS — M05722 Rheumatoid arthritis with rheumatoid factor of left elbow without organ or systems involvement: Secondary | ICD-10-CM | POA: Diagnosis not present

## 2018-01-05 DIAGNOSIS — D638 Anemia in other chronic diseases classified elsewhere: Secondary | ICD-10-CM | POA: Diagnosis present

## 2018-01-05 DIAGNOSIS — I2510 Atherosclerotic heart disease of native coronary artery without angina pectoris: Secondary | ICD-10-CM | POA: Diagnosis present

## 2018-01-05 DIAGNOSIS — N12 Tubulo-interstitial nephritis, not specified as acute or chronic: Secondary | ICD-10-CM

## 2018-01-05 DIAGNOSIS — E872 Acidosis, unspecified: Secondary | ICD-10-CM | POA: Diagnosis present

## 2018-01-05 DIAGNOSIS — N390 Urinary tract infection, site not specified: Secondary | ICD-10-CM

## 2018-01-05 DIAGNOSIS — E870 Hyperosmolality and hypernatremia: Secondary | ICD-10-CM | POA: Diagnosis present

## 2018-01-05 DIAGNOSIS — Z881 Allergy status to other antibiotic agents status: Secondary | ICD-10-CM

## 2018-01-05 DIAGNOSIS — Z9012 Acquired absence of left breast and nipple: Secondary | ICD-10-CM

## 2018-01-05 DIAGNOSIS — C50412 Malignant neoplasm of upper-outer quadrant of left female breast: Secondary | ICD-10-CM | POA: Diagnosis present

## 2018-01-05 DIAGNOSIS — I959 Hypotension, unspecified: Secondary | ICD-10-CM | POA: Diagnosis present

## 2018-01-05 DIAGNOSIS — R809 Proteinuria, unspecified: Secondary | ICD-10-CM | POA: Diagnosis present

## 2018-01-05 DIAGNOSIS — T380X5A Adverse effect of glucocorticoids and synthetic analogues, initial encounter: Secondary | ICD-10-CM | POA: Diagnosis not present

## 2018-01-05 DIAGNOSIS — B3749 Other urogenital candidiasis: Secondary | ICD-10-CM | POA: Diagnosis present

## 2018-01-05 DIAGNOSIS — A419 Sepsis, unspecified organism: Principal | ICD-10-CM | POA: Diagnosis present

## 2018-01-05 DIAGNOSIS — E875 Hyperkalemia: Secondary | ICD-10-CM | POA: Diagnosis present

## 2018-01-05 DIAGNOSIS — Z888 Allergy status to other drugs, medicaments and biological substances status: Secondary | ICD-10-CM

## 2018-01-05 DIAGNOSIS — M810 Age-related osteoporosis without current pathological fracture: Secondary | ICD-10-CM | POA: Diagnosis present

## 2018-01-05 DIAGNOSIS — E119 Type 2 diabetes mellitus without complications: Secondary | ICD-10-CM | POA: Diagnosis present

## 2018-01-05 DIAGNOSIS — L89103 Pressure ulcer of unspecified part of back, stage 3: Secondary | ICD-10-CM | POA: Diagnosis present

## 2018-01-05 DIAGNOSIS — I471 Supraventricular tachycardia, unspecified: Secondary | ICD-10-CM | POA: Diagnosis present

## 2018-01-05 DIAGNOSIS — E785 Hyperlipidemia, unspecified: Secondary | ICD-10-CM | POA: Diagnosis present

## 2018-01-05 DIAGNOSIS — Z79810 Long term (current) use of selective estrogen receptor modulators (SERMs): Secondary | ICD-10-CM

## 2018-01-05 DIAGNOSIS — I10 Essential (primary) hypertension: Secondary | ICD-10-CM | POA: Diagnosis present

## 2018-01-05 DIAGNOSIS — J45909 Unspecified asthma, uncomplicated: Secondary | ICD-10-CM | POA: Diagnosis present

## 2018-01-05 DIAGNOSIS — Z9981 Dependence on supplemental oxygen: Secondary | ICD-10-CM

## 2018-01-05 DIAGNOSIS — R6520 Severe sepsis without septic shock: Secondary | ICD-10-CM | POA: Diagnosis not present

## 2018-01-05 DIAGNOSIS — Z8711 Personal history of peptic ulcer disease: Secondary | ICD-10-CM

## 2018-01-05 DIAGNOSIS — L8993 Pressure ulcer of unspecified site, stage 3: Secondary | ICD-10-CM | POA: Diagnosis present

## 2018-01-05 DIAGNOSIS — Z886 Allergy status to analgesic agent status: Secondary | ICD-10-CM

## 2018-01-05 DIAGNOSIS — Z7952 Long term (current) use of systemic steroids: Secondary | ICD-10-CM

## 2018-01-05 DIAGNOSIS — K219 Gastro-esophageal reflux disease without esophagitis: Secondary | ICD-10-CM | POA: Diagnosis present

## 2018-01-05 DIAGNOSIS — Z8261 Family history of arthritis: Secondary | ICD-10-CM

## 2018-01-05 DIAGNOSIS — R4182 Altered mental status, unspecified: Secondary | ICD-10-CM

## 2018-01-05 DIAGNOSIS — Z7951 Long term (current) use of inhaled steroids: Secondary | ICD-10-CM

## 2018-01-05 DIAGNOSIS — Z794 Long term (current) use of insulin: Secondary | ICD-10-CM

## 2018-01-05 DIAGNOSIS — E860 Dehydration: Secondary | ICD-10-CM | POA: Diagnosis present

## 2018-01-05 DIAGNOSIS — M40209 Unspecified kyphosis, site unspecified: Secondary | ICD-10-CM | POA: Diagnosis present

## 2018-01-05 DIAGNOSIS — E1121 Type 2 diabetes mellitus with diabetic nephropathy: Secondary | ICD-10-CM | POA: Diagnosis not present

## 2018-01-05 DIAGNOSIS — Z79899 Other long term (current) drug therapy: Secondary | ICD-10-CM

## 2018-01-05 DIAGNOSIS — D649 Anemia, unspecified: Secondary | ICD-10-CM | POA: Diagnosis present

## 2018-01-05 DIAGNOSIS — M069 Rheumatoid arthritis, unspecified: Secondary | ICD-10-CM | POA: Diagnosis present

## 2018-01-05 DIAGNOSIS — Z87440 Personal history of urinary (tract) infections: Secondary | ICD-10-CM

## 2018-01-05 DIAGNOSIS — Z7982 Long term (current) use of aspirin: Secondary | ICD-10-CM

## 2018-01-05 HISTORY — DX: Hyperlipidemia, unspecified: E78.5

## 2018-01-05 LAB — BLOOD GAS, VENOUS
Acid-base deficit: 2.3 mmol/L — ABNORMAL HIGH (ref 0.0–2.0)
Bicarbonate: 20.4 mmol/L (ref 20.0–28.0)
FIO2: 21
O2 Saturation: 49.9 %
PCO2 VEN: 63.2 mmHg — AB (ref 44.0–60.0)
PO2 VEN: 34.7 mmHg (ref 32.0–45.0)
pH, Ven: 7.213 — ABNORMAL LOW (ref 7.250–7.430)

## 2018-01-05 LAB — PROTIME-INR
INR: 0.97
Prothrombin Time: 12.8 seconds (ref 11.4–15.2)

## 2018-01-05 LAB — BLOOD GAS, ARTERIAL
Acid-Base Excess: 2.1 mmol/L — ABNORMAL HIGH (ref 0.0–2.0)
Bicarbonate: 26.3 mmol/L (ref 20.0–28.0)
DRAWN BY: 234301
FIO2: 21
O2 Saturation: 93 %
PATIENT TEMPERATURE: 37
pCO2 arterial: 38.3 mmHg (ref 32.0–48.0)
pH, Arterial: 7.444 (ref 7.350–7.450)
pO2, Arterial: 68.4 mmHg — ABNORMAL LOW (ref 83.0–108.0)

## 2018-01-05 LAB — CBC WITH DIFFERENTIAL/PLATELET
BASOS ABS: 0 10*3/uL (ref 0.0–0.1)
BASOS PCT: 0 %
Eosinophils Absolute: 0 10*3/uL (ref 0.0–0.7)
Eosinophils Relative: 0 %
HEMATOCRIT: 39.8 % (ref 36.0–46.0)
Hemoglobin: 12 g/dL (ref 12.0–15.0)
Lymphocytes Relative: 8 %
Lymphs Abs: 1.7 10*3/uL (ref 0.7–4.0)
MCH: 29 pg (ref 26.0–34.0)
MCHC: 30.2 g/dL (ref 30.0–36.0)
MCV: 96.1 fL (ref 78.0–100.0)
MONO ABS: 1 10*3/uL (ref 0.1–1.0)
Monocytes Relative: 5 %
NEUTROS ABS: 19.1 10*3/uL — AB (ref 1.7–7.7)
NEUTROS PCT: 87 %
Platelets: 281 10*3/uL (ref 150–400)
RBC: 4.14 MIL/uL (ref 3.87–5.11)
RDW: 13.8 % (ref 11.5–15.5)
WBC: 21.8 10*3/uL — ABNORMAL HIGH (ref 4.0–10.5)

## 2018-01-05 LAB — URINALYSIS, ROUTINE W REFLEX MICROSCOPIC
Bilirubin Urine: NEGATIVE
KETONES UR: NEGATIVE mg/dL
Nitrite: NEGATIVE
PH: 7 (ref 5.0–8.0)
Protein, ur: 100 mg/dL — AB
SPECIFIC GRAVITY, URINE: 1.011 (ref 1.005–1.030)

## 2018-01-05 LAB — MRSA PCR SCREENING: MRSA BY PCR: NEGATIVE

## 2018-01-05 LAB — LACTIC ACID, PLASMA
LACTIC ACID, VENOUS: 8.8 mmol/L — AB (ref 0.5–1.9)
Lactic Acid, Venous: 1.8 mmol/L (ref 0.5–1.9)

## 2018-01-05 LAB — COMPREHENSIVE METABOLIC PANEL
ALK PHOS: 63 U/L (ref 38–126)
ALT: 11 U/L — ABNORMAL LOW (ref 14–54)
ANION GAP: 16 — AB (ref 5–15)
AST: 23 U/L (ref 15–41)
Albumin: 3 g/dL — ABNORMAL LOW (ref 3.5–5.0)
BUN: 21 mg/dL — ABNORMAL HIGH (ref 6–20)
CALCIUM: 9.5 mg/dL (ref 8.9–10.3)
CO2: 26 mmol/L (ref 22–32)
Chloride: 106 mmol/L (ref 101–111)
Creatinine, Ser: 1.23 mg/dL — ABNORMAL HIGH (ref 0.44–1.00)
GFR calc non Af Amer: 38 mL/min — ABNORMAL LOW (ref >60)
GFR, EST AFRICAN AMERICAN: 44 mL/min — AB (ref >60)
GLUCOSE: 74 mg/dL (ref 65–99)
Potassium: 5.2 mmol/L — ABNORMAL HIGH (ref 3.5–5.1)
Sodium: 148 mmol/L — ABNORMAL HIGH (ref 135–145)
Total Bilirubin: 0.6 mg/dL (ref 0.3–1.2)
Total Protein: 7 g/dL (ref 6.5–8.1)

## 2018-01-05 LAB — CBG MONITORING, ED: Glucose-Capillary: 150 mg/dL — ABNORMAL HIGH (ref 65–99)

## 2018-01-05 LAB — GLUCOSE, CAPILLARY: GLUCOSE-CAPILLARY: 138 mg/dL — AB (ref 65–99)

## 2018-01-05 MED ORDER — SODIUM CHLORIDE 0.45 % IV SOLN: INTRAVENOUS | Status: DC

## 2018-01-05 MED ORDER — INSULIN GLARGINE 100 UNIT/ML ~~LOC~~ SOLN
5.0000 [IU] | Freq: Every day | SUBCUTANEOUS | Status: DC
  Administered 2018-01-05 – 2018-01-08 (×4): 5 [IU] via SUBCUTANEOUS
  Filled 2018-01-05 (×6): qty 0.05

## 2018-01-05 MED ORDER — VITAMIN D 1000 UNITS PO TABS
2000.0000 [IU] | ORAL_TABLET | Freq: Every day | ORAL | Status: DC
  Administered 2018-01-06 – 2018-01-11 (×6): 2000 [IU] via ORAL
  Filled 2018-01-05 (×6): qty 2

## 2018-01-05 MED ORDER — POLYVINYL ALCOHOL 1.4 % OP SOLN
1.0000 [drp] | Freq: Every morning | OPHTHALMIC | Status: DC
  Administered 2018-01-06 – 2018-01-10 (×5): 1 [drp] via OPHTHALMIC
  Filled 2018-01-05 (×2): qty 15

## 2018-01-05 MED ORDER — CICLOPIROX 8 % EX SOLN
Freq: Every day | CUTANEOUS | Status: DC
Start: 2018-01-06 — End: 2018-01-11
  Administered 2018-01-07: 10:00:00 via TOPICAL
  Filled 2018-01-05 (×11): qty 6.6

## 2018-01-05 MED ORDER — MAGNESIUM OXIDE 400 (241.3 MG) MG PO TABS: 200.0000 mg | ORAL_TABLET | Freq: Every day | ORAL | Status: DC

## 2018-01-05 MED ORDER — MAGNESIUM OXIDE 400 (241.3 MG) MG PO TABS: 400.0000 mg | ORAL_TABLET | ORAL | Status: DC

## 2018-01-05 MED ORDER — PANTOPRAZOLE SODIUM 40 MG PO TBEC
40.0000 mg | DELAYED_RELEASE_TABLET | Freq: Every day | ORAL | Status: DC
  Administered 2018-01-06 – 2018-01-11 (×6): 40 mg via ORAL
  Filled 2018-01-05 (×6): qty 1

## 2018-01-05 MED ORDER — MOMETASONE FURO-FORMOTEROL FUM 200-5 MCG/ACT IN AERO
2.0000 | INHALATION_SPRAY | Freq: Two times a day (BID) | RESPIRATORY_TRACT | Status: DC
  Administered 2018-01-06 – 2018-01-11 (×11): 2 via RESPIRATORY_TRACT
  Filled 2018-01-05: qty 8.8

## 2018-01-05 MED ORDER — ASPIRIN EC 81 MG PO TBEC
81.0000 mg | DELAYED_RELEASE_TABLET | Freq: Two times a day (BID) | ORAL | Status: DC
  Administered 2018-01-05 – 2018-01-11 (×12): 81 mg via ORAL
  Filled 2018-01-05 (×12): qty 1

## 2018-01-05 MED ORDER — ADULT MULTIVITAMIN W/MINERALS CH
1.0000 | ORAL_TABLET | Freq: Every morning | ORAL | Status: DC
  Administered 2018-01-06 – 2018-01-11 (×6): 1 via ORAL
  Filled 2018-01-05 (×6): qty 1

## 2018-01-05 MED ORDER — SODIUM CHLORIDE 0.9 % IV BOLUS (SEPSIS)
500.0000 mL | Freq: Once | INTRAVENOUS | Status: AC
  Administered 2018-01-05: 500 mL via INTRAVENOUS

## 2018-01-05 MED ORDER — LEVOFLOXACIN IN D5W 750 MG/150ML IV SOLN
750.0000 mg | Freq: Once | INTRAVENOUS | Status: AC
  Administered 2018-01-05: 750 mg via INTRAVENOUS
  Filled 2018-01-05: qty 150

## 2018-01-05 MED ORDER — PREDNISONE 10 MG PO TABS: 5.0000 mg | ORAL_TABLET | Freq: Every day | ORAL | Status: DC

## 2018-01-05 MED ORDER — MAGNESIUM OXIDE 400 (241.3 MG) MG PO TABS
200.0000 mg | ORAL_TABLET | ORAL | Status: DC
  Administered 2018-01-06: 200 mg via ORAL
  Filled 2018-01-05: qty 1

## 2018-01-05 MED ORDER — AZTREONAM 2 G IJ SOLR
2.0000 g | Freq: Once | INTRAMUSCULAR | Status: AC
  Administered 2018-01-05: 2 g via INTRAVENOUS
  Filled 2018-01-05: qty 2

## 2018-01-05 MED ORDER — RALOXIFENE HCL 60 MG PO TABS
60.0000 mg | ORAL_TABLET | Freq: Every day | ORAL | Status: DC
  Administered 2018-01-06 – 2018-01-11 (×6): 60 mg via ORAL
  Filled 2018-01-05 (×7): qty 1

## 2018-01-05 MED ORDER — TAMOXIFEN CITRATE 10 MG PO TABS
20.0000 mg | ORAL_TABLET | Freq: Every day | ORAL | Status: DC
  Administered 2018-01-06 – 2018-01-11 (×6): 20 mg via ORAL
  Filled 2018-01-05 (×6): qty 2

## 2018-01-05 MED ORDER — SODIUM CHLORIDE 0.9 % IV BOLUS (SEPSIS)
1000.0000 mL | Freq: Once | INTRAVENOUS | Status: AC
  Administered 2018-01-05: 1000 mL via INTRAVENOUS

## 2018-01-05 MED ORDER — SODIUM CHLORIDE 0.45 % IV SOLN
INTRAVENOUS | Status: DC
  Administered 2018-01-05: 1000 mL via INTRAVENOUS

## 2018-01-05 MED ORDER — METOPROLOL SUCCINATE ER 25 MG PO TB24: 25.0000 mg | ORAL_TABLET | Freq: Every day | ORAL | Status: DC

## 2018-01-05 MED ORDER — GUAIFENESIN ER 600 MG PO TB12: 600.0000 mg | ORAL_TABLET | Freq: Two times a day (BID) | ORAL | Status: DC | PRN

## 2018-01-05 MED ORDER — POLYSACCHARIDE IRON COMPLEX 150 MG PO CAPS
150.0000 mg | ORAL_CAPSULE | Freq: Every day | ORAL | Status: DC
Start: 2018-01-06 — End: 2018-01-11
  Administered 2018-01-06 – 2018-01-11 (×6): 150 mg via ORAL
  Filled 2018-01-05 (×6): qty 1

## 2018-01-05 MED ORDER — ATORVASTATIN CALCIUM 10 MG PO TABS
10.0000 mg | ORAL_TABLET | Freq: Every evening | ORAL | Status: DC
  Administered 2018-01-05 – 2018-01-10 (×6): 10 mg via ORAL
  Filled 2018-01-05 (×6): qty 1

## 2018-01-05 NOTE — ED Provider Notes (Addendum)
[facility] EMERGENCY DEPARTMENT Provider Note   CSN: [id number] Arrival date & time: 01/05/18  1533     History   Chief Complaint Chief Complaint  Patient presents with  . Weakness    HPI Alanya Vukelich is a 84 y.o. female.  HPI Level 5 caveat for altered mental status. 84 year old female comes in with chief complaint of altered mental status. Patient has history of CAD, diabetes, hypertension and she lives at home with her daughter.  According to family patient was doing well last night and even this morning, but around lunchtime she became confused and lethargic.  They also noted that patient was febrile and decided to bring her into the ER.   Patient is noted to be somnolent in the ER and weak.  She is not responding to any questions, but does open her eyes to command and follows simple commands.  Family denies any recent vomiting, diarrhea, cough, abdominal pain complaints, rash or falls.  Patient has had UTIs in the past with confusion.  There is no history of stroke.  Last normal was 9 AM.  Past Medical History:  Diagnosis Date  . Acid reflux   . Anemia   . Arthritis    ra  . Asthma   . Cancer [facility])    breast cancer  . Coronary artery disease   . Diabetes mellitus without complication ([place])   . Diverticulitis   . Gastric ulcer   . Hypertension   . Hypoxemia   . Kyphosis   . Kyphosis   . Mitral valve disorder   . Osteoporosis   . Oxygen dependent    2 liters at night  . Raynaud disease   . Thrombocytopenia [facility])     Patient Active Problem List   Diagnosis Date Noted  . HCAP (healthcare-associated pneumonia) 03/06/2017  . Hyperglycemia 03/03/2017  . Electrolyte abnormality 03/03/2017  . Pressure injury of skin 01/16/2017  . Dehydration   . Lactic acidosis 07/19/2016  . Asthma, chronic 04/12/2016  . Sepsis ([place]) 03/08/2016  . CAP (community acquired pneumonia) 10/09/2015  . Pressure ulcer 10/09/2015  . Kyphosis   . UTI (urinary tract infection)  06/30/2015  . Breast cancer of upper-outer quadrant of left female breast ([place]) 05/09/2015  . Rheumatoid arthritis ([place]) 10/29/2012  . HTN (hypertension) 10/29/2012  . Diabetes type 2, controlled ([place]) 10/29/2012  . GERD (gastroesophageal reflux disease) 10/29/2012    Past Surgical History:  Procedure Laterality Date  . BREAST SURGERY    . HERNIA REPAIR    . MASTECTOMY Right 1978  . SIMPLE MASTECTOMY WITH AXILLARY SENTINEL NODE BIOPSY Left 05/09/2015   Procedure: LEFT SIMPLE MASTECTOMY;  Surgeon: Erroll Luna, MD;  Location: [facility];  Service: General;  Laterality: Left;     OB History    Gravida  4   Para  4   Term  4   Preterm      AB      Living  4     SAB      TAB      Ectopic      Multiple      Live Births               Home Medications    Prior to Admission medications   Medication Sig Start Date End Date Taking? Authorizing Provider  aspirin EC 81 MG tablet Take 81 mg by mouth 2 (two) times daily.    Yes [provider]  atorvastatin (LIPITOR) 10 MG tablet  Take 10 mg by mouth every evening.    Yes [provider]  budesonide-formoterol (SYMBICORT) 160-4.5 MCG/ACT inhaler Inhale 2 puffs into the lungs 2 (two) times daily.   Yes [provider]  Cholecalciferol (VITAMIN D3) 1000 UNITS CAPS Take 2,000 Units by mouth daily.    Yes [provider]  ciclopirox (PENLAC) 8 % solution APPLY TO FUNGAL TOENAILS EVERY DAY 02/11/16  Yes [provider]  D-MANNOSE PO Take 1,000 mg by mouth daily.    Yes [provider]  diltiazem (CARDIZEM CD) 120 MG 24 hr capsule Take 1 capsule (120 mg total) by mouth daily. 01/20/17  Yes Orvan Falconer, MD  guaiFENesin (MUCINEX) 600 MG 12 hr tablet Take 1 tablet (600 mg total) by mouth 2 (two) times daily. Patient taking differently: Take 600 mg by mouth 2 (two) times daily as needed for cough or to loosen phlegm.  03/07/17  Yes Kathie Dike, MD  insulin aspart (NOVOLOG) 100 UNIT/ML  FlexPen Inject 4 Units into the skin 3 (three) times daily with meals. Patient taking differently: Inject 1-10 Units into the skin 3 (three) times daily with meals. Per sliding scale. For levels over 350- 10 units is administered 03/07/17  Yes Memon, Jolaine Artist, MD  Insulin Glargine (LANTUS) 100 UNIT/ML Solostar Pen Inject 5 Units into the skin daily. Patient taking differently: Inject 5 Units into the skin at bedtime.  03/07/17  Yes Kathie Dike, MD  ipratropium-albuterol (DUONEB) 0.5-2.5 (3) MG/3ML SOLN Take 3 mLs by nebulization 2 (two) times daily. 03/07/17  Yes Kathie Dike, MD  lactobacillus acidophilus & bulgar (LACTINEX) chewable tablet CHEW 1 TABLET BY MOUTH EVERY DAY 09/17/15  Yes [provider]  magnesium oxide (MAG-OX) 400 (241.3 Mg) MG tablet Take 0.5 tablets (200 mg total) by mouth daily. Patient taking differently: Take 200-400 mg by mouth daily. Alternating 200 mg one day then 400 mg the next. 04/15/16  Yes Reyne Dumas, MD  metFORMIN (GLUCOPHAGE) 500 MG tablet Take 1,000 mg by mouth 2 (two) times daily with a meal.   Yes [provider]  metoprolol succinate (TOPROL-XL) 25 MG 24 hr tablet Take 1 tablet by mouth daily. May take one-half tablet if blood pressure levels are less than 90/50 and symptomatic 03/01/17  Yes [provider]  Multiple Vitamins-Minerals (CENTRUM SILVER ADULT 50+ PO) Take 1 tablet by mouth every morning.    Yes [provider]  pantoprazole (PROTONIX) 40 MG tablet Take 40 mg by mouth daily.   Yes [provider]  POLY-IRON 150 FORTE 150-25-1 MG-MCG-MG CAPS TAKE ONE CAPSULE BY MOUTH DAILY 04/07/17  Yes Kefalas, Manon Hilding, PA-C  Polyvinyl Alcohol-Povidone (CLEAR EYES ALL SEASONS) 5-6 MG/ML SOLN Apply 1 drop to eye every morning.    Yes [provider]  predniSONE (DELTASONE) 5 MG tablet Take 2 tablets (10 mg total) by mouth daily with breakfast. Patient taking differently: Take 5 mg by mouth daily with breakfast.   01/20/17  Yes Orvan Falconer, MD  raloxifene (EVISTA) 60 MG tablet Take 60 mg by mouth daily.   Yes [provider]  tamoxifen (NOLVADEX) 20 MG tablet Take 1 tablet (20 mg total) daily by mouth. 08/14/17  Yes Choksi, Delorise Shiner, MD  Insulin Pen Needle (PEN NEEDLES 5/16") 30G X 8 MM MISC Use as directed, before meals and as needed 03/07/17   Kathie Dike, MD    Family History Family History  Problem Relation Age of Onset  . Asthma Sister   . Rheum arthritis Daughter  Social History Social History   Tobacco Use  . Smoking status: Never Smoker  . Smokeless tobacco: Never Used  Substance Use Topics  . Alcohol use: No  . Drug use: No     Allergies   Aspirin; Megace [megestrol]; and Rocephin [ceftriaxone sodium in dextrose]   Review of Systems Review of Systems  Unable to perform ROS: Mental status change     Physical Exam Updated Vital Signs BP (!) 101/59   Pulse (!) 103   Temp (!) 102.2 F (39 C) (Oral)   Resp 18   Ht 5\' 4"  (1.626 m)   Wt 45.4 kg (100 lb)   SpO2 99%   BMI 17.16 kg/m   Physical Exam  Constitutional: She appears well-developed.  HENT:  Head: Normocephalic and atraumatic.  Eyes: EOM are normal.  Neck: Normal range of motion. Neck supple.  Cardiovascular: Normal rate.  Pulmonary/Chest: Effort normal.  Abdominal: Bowel sounds are normal.  Neurological:  Somnolent, disoriented, Moving all 4 extremity and appears to have gross sensation to all 4 extremities.  Skin: Skin is warm and dry.  Nursing note and vitals reviewed.    ED Treatments / Results  Labs (all labs ordered are listed, but only abnormal results are displayed) Labs Reviewed  COMPREHENSIVE METABOLIC PANEL - Abnormal; Notable for the following components:      Result Value   Sodium 148 (*)    Potassium 5.2 (*)    BUN 21 (*)    Creatinine, Ser 1.23 (*)    Albumin 3.0 (*)    ALT 11 (*)    GFR calc non Af Amer 38 (*)    GFR calc Af Amer 44 (*)    Anion gap 16 (*)    All  other components within normal limits  CBC WITH DIFFERENTIAL/PLATELET - Abnormal; Notable for the following components:   WBC 21.8 (*)    Neutro Abs 19.1 (*)    All other components within normal limits  URINALYSIS, ROUTINE W REFLEX MICROSCOPIC - Abnormal; Notable for the following components:   APPearance CLOUDY (*)    Glucose, UA >=500 (*)    Hgb urine dipstick MODERATE (*)    Protein, ur 100 (*)    Leukocytes, UA LARGE (*)    Bacteria, UA RARE (*)    Squamous Epithelial / LPF 0-5 (*)    Non Squamous Epithelial 0-5 (*)    All other components within normal limits  BLOOD GAS, VENOUS - Abnormal; Notable for the following components:   pH, Ven 7.213 (*)    pCO2, Ven 63.2 (*)    Acid-base deficit 2.3 (*)    All other components within normal limits  LACTIC ACID, PLASMA - Abnormal; Notable for the following components:   Lactic Acid, Venous 8.8 (*)    All other components within normal limits  CBG MONITORING, ED - Abnormal; Notable for the following components:   Glucose-Capillary 150 (*)    All other components within normal limits  CULTURE, BLOOD (ROUTINE X 2)  URINE CULTURE  PROTIME-INR  LACTIC ACID, PLASMA  LACTIC ACID, PLASMA  LACTIC ACID, PLASMA  BLOOD GAS, ARTERIAL    EKG EKG Interpretation  Date/Time:  Tuesday January 05 2018 15:45:53 EDT Ventricular Rate:  118 PR Interval:    QRS Duration: 85 QT Interval:  319 QTC Calculation: 447 R Axis:   -4 Text Interpretation:  Sinus tachycardia Abnormal R-wave progression, early transition LVH with secondary repolarization abnormality No acute changes Nonspecific ST and T wave abnormality  Confirmed by Varney Biles [phone]) on 01/05/2018 4:13:56 PM   Radiology Ct Head Wo Contrast  Result Date: 01/05/2018 CLINICAL DATA:  Altered mental status. EXAM: CT HEAD WITHOUT CONTRAST TECHNIQUE: Contiguous axial images were obtained from the base of the skull through the vertex without intravenous contrast. COMPARISON:  None. FINDINGS:  Brain: No evidence of acute infarction, hemorrhage, hydrocephalus, extra-axial collection or mass lesion/mass effect. Mild age related cerebral atrophy. Periventricular and subcortical white matter hypodensities are nonspecific but favored to reflect chronic microvascular ischemic changes. Vascular: Atherosclerotic vascular calcification of the carotid siphons. No hyperdense vessel. Skull: Negative for fracture or focal lesion. Sinuses/Orbits: No acute finding. Other: Partially visualized atrophy of the proximal cervical spinal cord. IMPRESSION: 1. Normal for age noncontrast head CT. 2. Partially visualized atrophy of the proximal cervical spinal cord. Electronically Signed   By: Titus Dubin M.D.   On: 01/05/2018 17:24   Dg Chest Port 1 View  Result Date: 01/05/2018 CLINICAL DATA:  Fever with altered mental status EXAM: PORTABLE CHEST 1 VIEW COMPARISON:  Mar 04, 2017 FINDINGS: There is stable mild elevation of the left hemidiaphragm. There is no edema or consolidation. The heart size and pulmonary vascularity are normal. No adenopathy. There is aortic atherosclerosis. There are surgical clips in the left lateral breast/axillary region. IMPRESSION: No edema or consolidation. Heart size normal. There is aortic atherosclerosis. Aortic Atherosclerosis (ICD10-I70.0). Electronically Signed   By: Lowella Grip III M.D.   On: 01/05/2018 16:27    Procedures Procedures (including critical care time)  CRITICAL CARE Performed by: Naelle Diegel   Total critical care time: 49 minutes  Critical care time was exclusive of separately billable procedures and treating other patients.  Critical care was necessary to treat or prevent imminent or life-threatening deterioration.  Critical care was time spent personally by me on the following activities: development of treatment plan with patient and/or surrogate as well as nursing, discussions with consultants, evaluation of patient's response to treatment,  examination of patient, obtaining history from patient or surrogate, ordering and performing treatments and interventions, ordering and review of laboratory studies, ordering and review of radiographic studies, pulse oximetry and re-evaluation of patient's condition.   Medications Ordered in ED Medications  aztreonam (AZACTAM) 2 g in sodium chloride 0.9 % 100 mL IVPB (2 g Intravenous New Bag/Given 01/05/18 1846)  0.45 % sodium chloride infusion (has no administration in time range)  sodium chloride 0.9 % bolus 1,000 mL (0 mLs Intravenous Stopped 01/05/18 1754)    And  sodium chloride 0.9 % bolus 500 mL (500 mLs Intravenous New Bag/Given 01/05/18 1559)  levofloxacin (LEVAQUIN) IVPB 750 mg (0 mg Intravenous Stopped 01/05/18 1815)     Initial Impression / Assessment and Plan / ED Course  I have reviewed the triage vital signs and the nursing notes.  Pertinent labs & imaging results that were available during my care of the patient were reviewed by me and considered in my medical decision making (see chart for details).  Clinical Course as of Jan 05 1845  Tue Jan 05, 2018  1648 Nurse informed me that they were unable to obtain 2 blood cultures. In the interest of patient care, I have asked them to start the antibiotics without waiting for the second blood culture at this point.   [AN]  1740 HR is elevated, but pt is more alert. Labs has a downtime, so we may not get lactate results in timely patient.    [AN]  1740 No acute findings.  CT  Head Wo Contrast [AN]  5361 Concerning for underlying infection, especially given since she has had UTI with altered mental status. I spoke with family and patient has responded well to [place] in the past.  We will start IV Levaquin given the severe allergic reaction to ceftriaxone.  Urinalysis, Routine w reflex microscopic(!) [AN]  1844 Initial lactate was reported to [place] as greater than 8.. I suspect that this could be lab error, or the results are falsely  elevated due to delay in lab processing the lactate because the lab was down. We will get an ABG with lactic acid. Patient is already received 30 cc/kg fluid. Is getting her broad-spectrum antibiotics.    [AN]  [id number] Hospitalist aware of the lactic acid.  Sepsis reassessment completed.   [AN]    Clinical Course User Index [AN] Varney Biles, MD    A 84 year old female comes in with chief complaint of altered mental status.  DDx includes: ICH / Stroke ACS Sepsis syndrome Infection - UTI/Pneumonia Encephalopathy  Electrolyte abnormality Drug overdose DKA Metabolic disorders including thyroid disorders, adrenal insufficiency  Patient is noted to be febrile and tachycardic.  Initial concern is for sepsis as the underlying process, with UTI/pyelonephritis as the likely source. CT head ordered to ensure there is no brain bleed given sudden change in mentation. Also trop to evaluate for NSTEMI.  Final Clinical Impressions(s) / ED Diagnoses   Final diagnoses:  Altered mental status, unspecified altered mental status type  Pyelonephritis  Severe sepsis ([place])  Lactic acidosis    ED Discharge Orders    None       Varney Biles, MD 01/05/18 [place], [place], MD 01/05/18 [phone]

## 2018-01-05 NOTE — H&P (Signed)
History and Physical    [name] SAY:[id number] DOB: [birth year] DOA: 01/05/2018  PCP: Sabrina Cipro, MD   Patient coming from: Home.  I have personally briefly reviewed patient's old medical records in [place]  Chief Complaint: Fever, weakness and AMS.  HPI: [name] is a 84 y.o. female with medical history significant of  anemia, osteoarthritis, asthma, kyphosis, home oxygen, history of breast cancer, history of CAD, type 2 diabetes, GERD, PUD, diverticulosis, history of diverticulitis, osteoporosis, history of thrombocytopenia, history of unspecified mitral valve disorder who is brought to the emergency department due to fever, weakness and decreased mentation since early afternoon.  She was last known to be well this morning. The patient is unable to provide further history at this time, but per her daughters, [name] and [name], symptoms are similar to when the patient gets an episode of UTI.   There had not been any complaints of pain.  No emesis or diarrhea. Prior to this, she has been eating and sleeping regularly until this morning.   ED Course: Initial vital signs temperature 29 C (100 and 0.2 F), pulse 116, respirations 20, blood pressure 124/87 mmHg and O2 sat 96% on room air.  The patient received 1500 mL's of normal saline bolus, aztreonam 2 g and Levaquin 750 mg IVPB in the ED.  Her urinalysis shows glucosuria more than 500 mg/dL, moderate hemoglobinuria, 100 mg of proteinuria and large leukocyte esterase.  Microscopic exam shows 6-30 RBC, TNTC WBC and rare bacteria.  There were positive budding G yeast and white blood cell clumps.   Urine and blood cultures x2 were sent to the lab.  Her white count was 21.8 with 87% neutrophils, 8% lymphocytes and 5% monocytes.  Hemoglobin 12.0 g/dL and platelets 281.  Her H&H was 10.6 on 08/14/2017 and before that average around 9-9.5 g/dL for the previous year.  PT 12.8 seconds and INR 0.97.  Sodium 148, potassium 5.2, chloride  106, CO2 26 and lactic acid 8.8 mmol/L.  Anion gap was 16.  Glucose was 74, BUN 21, creatinine 1.23 and calcium 9.5 mg/dL.  Her LFTs show an albumin of 3.0 g/dL, but all other values were unremarkable.  A venous blood gas shows a pH of 7.21, PCO2 of 63.2 and PO2 of 34.7 mmHg.  Bicarbonate was 20.4 and acid base 2.3 millimolar/L.  An arterial blood gas was subsequently drawn which showed a pH of 7.44, PCO2 of 38.3 and PO2 of 68.4 mmHg, bicarbonate 26.3 and acid base excess 2.1 millimolar/L.  Her O2 sat was 93%.  The venous gas was drawn before normal saline bolus, ABG was drawn after the NS bolus had been given.  Imaging: A single view chest radiograph showed a mild elevation of the left hemidiaphragm, which has been there before, normal heart size and pulmonary vascularity.  There is no edema or consolidation.  There is aortic atherosclerosis.  Please see images and full radiology report for further detail.  Review of Systems: Unable to fully obtain from the patient..    Past Medical History:  Diagnosis Date  . Acid reflux   . Anemia   . Arthritis    ra  . Asthma   . Cancer [facility])    breast cancer  . Coronary artery disease   . Diabetes mellitus without complication ([place])   . Diverticulitis   . Gastric ulcer   . Hypertension   . Hypoxemia   . Kyphosis   . Kyphosis   . Mitral valve disorder   .  Osteoporosis   . Oxygen dependent    2 liters at night  . Raynaud disease   . Thrombocytopenia ([place])     Past Surgical History:  Procedure Laterality Date  . BREAST SURGERY    . HERNIA REPAIR    . MASTECTOMY Right 1978  . SIMPLE MASTECTOMY WITH AXILLARY SENTINEL NODE BIOPSY Left 05/09/2015   Procedure: LEFT SIMPLE MASTECTOMY;  Surgeon: Sabrina Luna, MD;  Location: [facility];  Service: General;  Laterality: Left;     reports that she has never smoked. She has never used smokeless tobacco. She reports that she does not drink alcohol or use drugs.  Allergies  Allergen Reactions  . Aspirin  Other (See Comments)    Cannot take uncoated due to stomach ulcers  . Megace [Megestrol]     abd pain   . Rocephin [Ceftriaxone Sodium In Dextrose] Swelling    Family History  Problem Relation Age of Onset  . Asthma Sister   . Rheum arthritis Daughter     Prior to Admission medications   Medication Sig Start Date End Date Taking? Authorizing Provider  aspirin EC 81 MG tablet Take 81 mg by mouth 2 (two) times daily.    Yes [provider]  atorvastatin (LIPITOR) 10 MG tablet Take 10 mg by mouth every evening.    Yes [provider]  budesonide-formoterol (SYMBICORT) 160-4.5 MCG/ACT inhaler Inhale 2 puffs into the lungs 2 (two) times daily.   Yes [provider]  Cholecalciferol (VITAMIN D3) 1000 UNITS CAPS Take 2,000 Units by mouth daily.    Yes [provider]  ciclopirox (PENLAC) 8 % solution APPLY TO FUNGAL TOENAILS EVERY DAY 02/11/16  Yes [provider]  D-MANNOSE PO Take 1,000 mg by mouth daily.    Yes [provider]  diltiazem (CARDIZEM CD) 120 MG 24 hr capsule Take 1 capsule (120 mg total) by mouth daily. 01/20/17  Yes Orvan Falconer, MD  guaiFENesin (MUCINEX) 600 MG 12 hr tablet Take 1 tablet (600 mg total) by mouth 2 (two) times daily. Patient taking differently: Take 600 mg by mouth 2 (two) times daily as needed for cough or to loosen phlegm.  03/07/17  Yes Kathie Dike, MD  insulin aspart (NOVOLOG) 100 UNIT/ML FlexPen Inject 4 Units into the skin 3 (three) times daily with meals. Patient taking differently: Inject 1-10 Units into the skin 3 (three) times daily with meals. Per sliding scale. For levels over 350- 10 units is administered 03/07/17  Yes Memon, Jolaine Artist, MD  Insulin Glargine (LANTUS) 100 UNIT/ML Solostar Pen Inject 5 Units into the skin daily. Patient taking differently: Inject 5 Units into the skin at bedtime.  03/07/17  Yes Kathie Dike, MD  ipratropium-albuterol (DUONEB) 0.5-2.5 (3) MG/3ML SOLN Take 3 mLs by  nebulization 2 (two) times daily. 03/07/17  Yes Kathie Dike, MD  lactobacillus acidophilus & bulgar (LACTINEX) chewable tablet CHEW 1 TABLET BY MOUTH EVERY DAY 09/17/15  Yes [provider]  magnesium oxide (MAG-OX) 400 (241.3 Mg) MG tablet Take 0.5 tablets (200 mg total) by mouth daily. Patient taking differently: Take 200-400 mg by mouth daily. Alternating 200 mg one day then 400 mg the next. 04/15/16  Yes Reyne Dumas, MD  metFORMIN (GLUCOPHAGE) 500 MG tablet Take 1,000 mg by mouth 2 (two) times daily with a meal.   Yes [provider]  metoprolol succinate (TOPROL-XL) 25 MG 24 hr tablet Take 1 tablet by mouth daily. May take one-half tablet if blood pressure levels are less than  90/50 and symptomatic 03/01/17  Yes [provider]  Multiple Vitamins-Minerals (CENTRUM SILVER ADULT 50+ PO) Take 1 tablet by mouth every morning.    Yes [provider]  pantoprazole (PROTONIX) 40 MG tablet Take 40 mg by mouth daily.   Yes [provider]  POLY-IRON 150 FORTE 150-25-1 MG-MCG-MG CAPS TAKE ONE CAPSULE BY MOUTH DAILY 04/07/17  Yes Kefalas, Manon Hilding, PA-C  Polyvinyl Alcohol-Povidone (CLEAR EYES ALL SEASONS) 5-6 MG/ML SOLN Apply 1 drop to eye every morning.    Yes [provider]  predniSONE (DELTASONE) 5 MG tablet Take 2 tablets (10 mg total) by mouth daily with breakfast. Patient taking differently: Take 5 mg by mouth daily with breakfast.  01/20/17  Yes Orvan Falconer, MD  raloxifene (EVISTA) 60 MG tablet Take 60 mg by mouth daily.   Yes [provider]  tamoxifen (NOLVADEX) 20 MG tablet Take 1 tablet (20 mg total) daily by mouth. 08/14/17  Yes Choksi, Delorise Shiner, MD  Insulin Pen Needle (PEN NEEDLES 5/16") 30G X 8 MM MISC Use as directed, before meals and as needed 03/07/17   Kathie Dike, MD    Physical Exam: Vitals:   01/05/18 1700 01/05/18 1745 01/05/18 1819 01/05/18 1845  BP: 104/83 128/61 128/60 (!) 101/59  Pulse:  (!) 108 (!) 104 (!) 103    Resp: 20 19 14 18   Temp:      TempSrc:      SpO2:  100% 100% 99%  Weight:      Height:        Constitutional: Looks chronically ill, but no acute distress at this time. Eyes: PERRL, lids and conjunctivae normal ENMT: Mucous membranes are mildly dry. Posterior pharynx clear of any exudate or lesions. Absent dentition.  Neck: normal, supple, no masses, no thyromegaly Respiratory: Decreased breath sounds on bases bilaterally, but no wheezing, no crackles. Normal respiratory effort. No accessory muscle use.  Cardiovascular: Tachycardic at 104 bpm, no murmurs / rubs / gallops. No extremity edema. 2+ pedal pulses. No carotid bruits.  Abdomen: Soft, no tenderness, no masses palpated. No hepatosplenomegaly. Bowel sounds positive.  Musculoskeletal: Severe kyphoscoliosis.  Severe MCP joints deformities on both hands.  No clubbing / cyanosis.  Good ROM, no contractures. Normal muscle tone.  Skin: Stage III lower thoracic and stage II sacral pressure ulcer.  Positive onychomycosis of toenails. Neurologic: CN 2-12 grossly intact. Sensation intact, DTR normal.  Moves all extremities, but has generalized weakness. Psychiatric:  Alert and oriented x 2, partially disoriented to situation, time and date. Normal mood.         Labs on Admission: I have personally reviewed following labs and imaging studies  CBC: Recent Labs  Lab 01/05/18 1643  WBC 21.8*  NEUTROABS 19.1*  HGB 12.0  HCT 39.8  MCV 96.1  PLT 154   Basic Metabolic Panel: Recent Labs  Lab 01/05/18 1643  NA 148*  K 5.2*  CL 106  CO2 26  GLUCOSE 74  BUN 21*  CREATININE 1.23*  CALCIUM 9.5   GFR: Estimated Creatinine Clearance: 22.2 mL/min (A) (by C-G formula based on SCr of 1.23 mg/dL (H)). Liver Function Tests: Recent Labs  Lab 01/05/18 1643  AST 23  ALT 11*  ALKPHOS 63  BILITOT 0.6  PROT 7.0  ALBUMIN 3.0*   No results for input(s): LIPASE, AMYLASE in the last 168 hours. No results for input(s): AMMONIA in the  last 168 hours. Coagulation Profile: Recent Labs  Lab 01/05/18 1637  INR 0.97   Cardiac  Enzymes: No results for input(s): CKTOTAL, CKMB, CKMBINDEX, TROPONINI in the last 168 hours. BNP (last 3 results) No results for input(s): PROBNP in the last 8760 hours. HbA1C: No results for input(s): HGBA1C in the last 72 hours. CBG: Recent Labs  Lab 01/05/18 1540  GLUCAP 150*   Lipid Profile: No results for input(s): CHOL, HDL, LDLCALC, TRIG, CHOLHDL, LDLDIRECT in the last 72 hours. Thyroid Function Tests: No results for input(s): TSH, T4TOTAL, FREET4, T3FREE, THYROIDAB in the last 72 hours. Anemia Panel: No results for input(s): VITAMINB12, FOLATE, FERRITIN, TIBC, IRON, RETICCTPCT in the last 72 hours. Urine analysis:    Component Value Date/Time   COLORURINE YELLOW 01/05/2018 1620   APPEARANCEUR CLOUDY (A) 01/05/2018 1620   LABSPEC 1.011 01/05/2018 1620   PHURINE 7.0 01/05/2018 1620   GLUCOSEU >=500 (A) 01/05/2018 1620   HGBUR MODERATE (A) 01/05/2018 1620   BILIRUBINUR NEGATIVE 01/05/2018 1620   KETONESUR NEGATIVE 01/05/2018 1620   PROTEINUR 100 (A) 01/05/2018 1620   UROBILINOGEN 0.2 06/30/2015 1317   NITRITE NEGATIVE 01/05/2018 1620   LEUKOCYTESUR LARGE (A) 01/05/2018 1620    Radiological Exams on Admission: Ct Head Wo Contrast  Result Date: 01/05/2018 CLINICAL DATA:  Altered mental status. EXAM: CT HEAD WITHOUT CONTRAST TECHNIQUE: Contiguous axial images were obtained from the base of the skull through the vertex without intravenous contrast. COMPARISON:  None. FINDINGS: Brain: No evidence of acute infarction, hemorrhage, hydrocephalus, extra-axial collection or mass lesion/mass effect. Mild age related cerebral atrophy. Periventricular and subcortical white matter hypodensities are nonspecific but favored to reflect chronic microvascular ischemic changes. Vascular: Atherosclerotic vascular calcification of the carotid siphons. No hyperdense vessel. Skull: Negative for fracture  or focal lesion. Sinuses/Orbits: No acute finding. Other: Partially visualized atrophy of the proximal cervical spinal cord. IMPRESSION: 1. Normal for age noncontrast head CT. 2. Partially visualized atrophy of the proximal cervical spinal cord. Electronically Signed   By: Titus Dubin M.D.   On: 01/05/2018 17:24   Dg Chest Port 1 View  Result Date: 01/05/2018 CLINICAL DATA:  Fever with altered mental status EXAM: PORTABLE CHEST 1 VIEW COMPARISON:  Mar 04, 2017 FINDINGS: There is stable mild elevation of the left hemidiaphragm. There is no edema or consolidation. The heart size and pulmonary vascularity are normal. No adenopathy. There is aortic atherosclerosis. There are surgical clips in the left lateral breast/axillary region. IMPRESSION: No edema or consolidation. Heart size normal. There is aortic atherosclerosis. Aortic Atherosclerosis (ICD10-I70.0). Electronically Signed   By: Lowella Grip III M.D.   On: 01/05/2018 16:27    EKG: Independently reviewed. Vent. rate 118 BPM PR interval * ms QRS duration 85 ms QT/QTc 319/447 ms P-R-T axes 53 -4 85 Sinus tachycardia Abnormal R-wave progression, early transition LVH with secondary repolarization abnormality No acute changes Nonspecific ST and T wave abnormality  Assessment/Plan Principal Problem:   Sepsis secondary to UTI ([place]) Admit to stepdown/inpatient. Continue supplemental oxygen. Continue gentle IV fluids. Monitor intake and output. Continue aztreonam per pharmacy. Continue levofloxacin per pharmacy. Follow-up blood culture and sensitivity. Follow-up urine culture and sensitivity.  Active Problems:   Lactic acidosis Continue sepsis treatment. Continue IV fluids. Follow-up lactic acid in 3 hours from first collection.    Hypernatremia Will use hypotonic solutions, after bolus had been given.  Rheumatoid arthritis ([place]).    HTN (hypertension) Hold Cardizem and metoprolol for now. Monitor blood pressure, pulse and  electrolytes. Resume once blood pressure more stable or if HR trends up.    Asthma, chronic Continue supplemental oxygen.  Continue Symbicort or formulary equivalent. Nebulized bronchodilators as needed.    Diabetes type 2, controlled ([place]) Last hemoglobin A1c 9.9% in May 2018 Carbohydrate modified diet. CBG monitoring with regular insulin sliding scale while in the hospital. Check hemoglobin A1c in a.m.    GERD (gastroesophageal reflux disease) Protonix 40 mg p.o. Daily.    Decubitus ulcer of thoracic spine area, stage III ([place])   Decubitus ulcer of sacral region, stage 2 ([place]) Continue local care. Consult wound and ostomy team in a.m.    Anemia Low iron and low normal ferritin level on 509 2018. Normal value due to hemoconcentration. Follow-up hematocrit and hemoglobin post hydration. Continue iron supplementation and multivitamin.    Hyperlipidemia Continue atorvastatin 10 mg p.o. daily. Monitor LFTs as needed.      DVT prophylaxis: Lovenox SQ. Code Status: Full code. Family Communication: 2 her daughters were present in the ED room. Two her daughters were in the ED room and the other 2 daughters were outside in the ED waiting room. Disposition Plan: Admit to stepdown for IV antibiotic therapy for 72-96 hours. Consults called:  Admission status: Inpatient/stepdown.   Reubin Milan MD [facility] Pager [phone].  If 7PM-7AM, please contact night-coverage [email or web address] Password [email or web address]  01/05/2018, 7:05 PM

## 2018-01-05 NOTE — ED Notes (Signed)
Date and time results received: 01/05/18   Test: Lactic Acid Critical Value: 8.8  Name of Provider Notified: Nanavati  Orders Received? Or Actions Taken?: arterial ABG with lactic acid

## 2018-01-05 NOTE — ED Notes (Signed)
CRITICAL VALUE ALERT  Critical Value:  PH 7.21, PCO2 63.2, PO2 34.7 Sats 49.9 and Bicarb 49.9                         Venous blood  Date & Time Notied:  01/05/18 @ [place]  Provider Notified: Dr Kathrynn Humble  Orders Received/Actions taken: see new orders

## 2018-01-05 NOTE — ED Triage Notes (Signed)
Pt from home, per family, more weak, not as responsive, fever 102, BS 79, daughters at the bed to give story to EDP

## 2018-01-05 NOTE — ED Notes (Signed)
Family changed pt undergarments. Assistance offered but family said non required from staff.

## 2018-01-06 ENCOUNTER — Other Ambulatory Visit: Payer: Self-pay

## 2018-01-06 DIAGNOSIS — I471 Supraventricular tachycardia, unspecified: Secondary | ICD-10-CM | POA: Diagnosis present

## 2018-01-06 DIAGNOSIS — R6520 Severe sepsis without septic shock: Secondary | ICD-10-CM

## 2018-01-06 DIAGNOSIS — E1121 Type 2 diabetes mellitus with diabetic nephropathy: Secondary | ICD-10-CM

## 2018-01-06 DIAGNOSIS — E870 Hyperosmolality and hypernatremia: Secondary | ICD-10-CM

## 2018-01-06 DIAGNOSIS — N390 Urinary tract infection, site not specified: Secondary | ICD-10-CM

## 2018-01-06 DIAGNOSIS — E872 Acidosis: Secondary | ICD-10-CM

## 2018-01-06 DIAGNOSIS — M05722 Rheumatoid arthritis with rheumatoid factor of left elbow without organ or systems involvement: Secondary | ICD-10-CM

## 2018-01-06 DIAGNOSIS — A419 Sepsis, unspecified organism: Principal | ICD-10-CM

## 2018-01-06 DIAGNOSIS — L8993 Pressure ulcer of unspecified site, stage 3: Secondary | ICD-10-CM

## 2018-01-06 DIAGNOSIS — K219 Gastro-esophageal reflux disease without esophagitis: Secondary | ICD-10-CM

## 2018-01-06 DIAGNOSIS — J45909 Unspecified asthma, uncomplicated: Secondary | ICD-10-CM

## 2018-01-06 LAB — LACTIC ACID, PLASMA
LACTIC ACID, VENOUS: 2.5 mmol/L — AB (ref 0.5–1.9)
LACTIC ACID, VENOUS: 3.3 mmol/L — AB (ref 0.5–1.9)

## 2018-01-06 LAB — CBC WITH DIFFERENTIAL/PLATELET
BASOS ABS: 0 10*3/uL (ref 0.0–0.1)
Basophils Relative: 0 %
Eosinophils Absolute: 0 10*3/uL (ref 0.0–0.7)
Eosinophils Relative: 0 %
HEMATOCRIT: 35.1 % — AB (ref 36.0–46.0)
HEMOGLOBIN: 10.5 g/dL — AB (ref 12.0–15.0)
LYMPHS PCT: 12 %
Lymphs Abs: 2.9 10*3/uL (ref 0.7–4.0)
MCH: 28.5 pg (ref 26.0–34.0)
MCHC: 29.9 g/dL — ABNORMAL LOW (ref 30.0–36.0)
MCV: 95.1 fL (ref 78.0–100.0)
MONOS PCT: 4 %
Monocytes Absolute: 1 10*3/uL (ref 0.1–1.0)
NEUTROS PCT: 84 %
Neutro Abs: 20 10*3/uL — ABNORMAL HIGH (ref 1.7–7.7)
Platelets: 213 10*3/uL (ref 150–400)
RBC: 3.69 MIL/uL — AB (ref 3.87–5.11)
RDW: 13.9 % (ref 11.5–15.5)
WBC: 23.9 10*3/uL — ABNORMAL HIGH (ref 4.0–10.5)

## 2018-01-06 LAB — BASIC METABOLIC PANEL
ANION GAP: 9 (ref 5–15)
BUN: 24 mg/dL — ABNORMAL HIGH (ref 6–20)
CHLORIDE: 109 mmol/L (ref 101–111)
CO2: 24 mmol/L (ref 22–32)
Calcium: 7.9 mg/dL — ABNORMAL LOW (ref 8.9–10.3)
Creatinine, Ser: 1.39 mg/dL — ABNORMAL HIGH (ref 0.44–1.00)
GFR calc non Af Amer: 33 mL/min — ABNORMAL LOW (ref >60)
GFR, EST AFRICAN AMERICAN: 38 mL/min — AB (ref >60)
Glucose, Bld: 146 mg/dL — ABNORMAL HIGH (ref 65–99)
Potassium: 4 mmol/L (ref 3.5–5.1)
SODIUM: 142 mmol/L (ref 135–145)

## 2018-01-06 LAB — GLUCOSE, CAPILLARY
GLUCOSE-CAPILLARY: 123 mg/dL — AB (ref 65–99)
GLUCOSE-CAPILLARY: 264 mg/dL — AB (ref 65–99)
Glucose-Capillary: 130 mg/dL — ABNORMAL HIGH (ref 65–99)
Glucose-Capillary: 277 mg/dL — ABNORMAL HIGH (ref 65–99)

## 2018-01-06 LAB — MAGNESIUM: Magnesium: 1.3 mg/dL — ABNORMAL LOW (ref 1.7–2.4)

## 2018-01-06 LAB — HEMOGLOBIN A1C
Hgb A1c MFr Bld: 7.6 % — ABNORMAL HIGH (ref 4.8–5.6)
Mean Plasma Glucose: 171.42 mg/dL

## 2018-01-06 LAB — CORTISOL: CORTISOL PLASMA: 23.2 ug/dL

## 2018-01-06 MED ORDER — SODIUM CHLORIDE 0.9 % IV SOLN
INTRAVENOUS | Status: DC
  Administered 2018-01-06 – 2018-01-08 (×4): via INTRAVENOUS

## 2018-01-06 MED ORDER — SODIUM CHLORIDE 0.9 % IV BOLUS
500.0000 mL | Freq: Once | INTRAVENOUS | Status: AC
  Administered 2018-01-06: 500 mL via INTRAVENOUS

## 2018-01-06 MED ORDER — ADENOSINE 6 MG/2ML IV SOLN: 6.0000 mg | Freq: Once | INTRAVENOUS | Status: AC

## 2018-01-06 MED ORDER — ACETAMINOPHEN 325 MG PO TABS
650.0000 mg | ORAL_TABLET | Freq: Four times a day (QID) | ORAL | Status: DC | PRN
  Administered 2018-01-06 – 2018-01-09 (×2): 650 mg via ORAL
  Filled 2018-01-06 (×2): qty 2

## 2018-01-06 MED ORDER — METOPROLOL TARTRATE 5 MG/5ML IV SOLN
INTRAVENOUS | Status: AC
  Filled 2018-01-06: qty 5

## 2018-01-06 MED ORDER — ADENOSINE 6 MG/2ML IV SOLN
INTRAVENOUS | Status: AC
  Administered 2018-01-06: 6 mg
  Filled 2018-01-06: qty 6

## 2018-01-06 MED ORDER — LEVOFLOXACIN IN D5W 500 MG/100ML IV SOLN
500.0000 mg | INTRAVENOUS | Status: DC
  Administered 2018-01-07: 500 mg via INTRAVENOUS
  Filled 2018-01-06: qty 100

## 2018-01-06 MED ORDER — METOPROLOL TARTRATE 5 MG/5ML IV SOLN
5.0000 mg | Freq: Once | INTRAVENOUS | Status: AC
  Administered 2018-01-06: 5 mg via INTRAVENOUS

## 2018-01-06 MED ORDER — INSULIN ASPART 100 UNIT/ML ~~LOC~~ SOLN
0.0000 [IU] | Freq: Every day | SUBCUTANEOUS | Status: DC
  Administered 2018-01-06 – 2018-01-07 (×2): 3 [IU] via SUBCUTANEOUS
  Administered 2018-01-08: 2 [IU] via SUBCUTANEOUS

## 2018-01-06 MED ORDER — ENOXAPARIN SODIUM 30 MG/0.3ML ~~LOC~~ SOLN
30.0000 mg | SUBCUTANEOUS | Status: DC
  Administered 2018-01-06 – 2018-01-07 (×2): 30 mg via SUBCUTANEOUS
  Filled 2018-01-06 (×2): qty 0.3

## 2018-01-06 MED ORDER — SODIUM CHLORIDE 0.9 % IV SOLN
1.0000 g | Freq: Two times a day (BID) | INTRAVENOUS | Status: DC
  Filled 2018-01-06: qty 1

## 2018-01-06 MED ORDER — AMIODARONE LOAD VIA INFUSION
150.0000 mg | Freq: Once | INTRAVENOUS | Status: AC
Start: 2018-01-06 — End: 2018-01-06
  Administered 2018-01-06: 150 mg via INTRAVENOUS
  Filled 2018-01-06: qty 83.34

## 2018-01-06 MED ORDER — ENOXAPARIN SODIUM 40 MG/0.4ML ~~LOC~~ SOLN: 40.0000 mg | SUBCUTANEOUS | Status: DC

## 2018-01-06 MED ORDER — AMIODARONE HCL IN DEXTROSE 360-4.14 MG/200ML-% IV SOLN
60.0000 mg/h | INTRAVENOUS | Status: AC
  Administered 2018-01-06: 60 mg/h via INTRAVENOUS
  Filled 2018-01-06 (×3): qty 200

## 2018-01-06 MED ORDER — SODIUM CHLORIDE 0.9 % IV SOLN
1.0000 g | Freq: Once | INTRAVENOUS | Status: AC
  Administered 2018-01-06: 1 g via INTRAVENOUS
  Filled 2018-01-06 (×2): qty 1

## 2018-01-06 MED ORDER — HYDROCORTISONE NA SUCCINATE PF 100 MG IJ SOLR
100.0000 mg | Freq: Three times a day (TID) | INTRAMUSCULAR | Status: DC
  Administered 2018-01-06 – 2018-01-07 (×4): 100 mg via INTRAVENOUS
  Filled 2018-01-06 (×4): qty 2

## 2018-01-06 MED ORDER — AMIODARONE HCL IN DEXTROSE 360-4.14 MG/200ML-% IV SOLN
30.0000 mg/h | INTRAVENOUS | Status: DC
  Administered 2018-01-07: 30 mg/h via INTRAVENOUS

## 2018-01-06 MED ORDER — SODIUM CHLORIDE 0.9 % IV BOLUS
1000.0000 mL | Freq: Once | INTRAVENOUS | Status: AC
  Administered 2018-01-06: 1000 mL via INTRAVENOUS

## 2018-01-06 MED ORDER — INSULIN ASPART 100 UNIT/ML ~~LOC~~ SOLN
0.0000 [IU] | Freq: Three times a day (TID) | SUBCUTANEOUS | Status: DC
  Administered 2018-01-07: 11 [IU] via SUBCUTANEOUS
  Administered 2018-01-07: 5 [IU] via SUBCUTANEOUS
  Administered 2018-01-07: 11 [IU] via SUBCUTANEOUS
  Administered 2018-01-08: 5 [IU] via SUBCUTANEOUS
  Administered 2018-01-08: 2 [IU] via SUBCUTANEOUS
  Administered 2018-01-08: 8 [IU] via SUBCUTANEOUS

## 2018-01-06 NOTE — Progress Notes (Signed)
ANTIBIOTIC CONSULT NOTE-Preliminary  Pharmacy Consult for Aztreonam Indication: UTI/Urosepsis  Allergies  Allergen Reactions  . Aspirin Other (See Comments)    Cannot take uncoated due to stomach ulcers  . Megace [Megestrol]     abd pain   . Rocephin [Ceftriaxone Sodium In Dextrose] Swelling    Patient Measurements: Height: 5\' 4"  (162.6 cm) Weight: 100 lb (45.4 kg) IBW/kg (Calculated) : 54.7  Vital Signs: Temp: 102.2 F (39 C) (04/02 1541) Temp Source: Oral (04/02 1541) BP: 86/51 (04/03 0000) Pulse Rate: 115 (04/03 0000)  Labs: Recent Labs    01/05/18 1643  WBC 21.8*  HGB 12.0  PLT 281  CREATININE 1.23*    Estimated Creatinine Clearance: 22.2 mL/min (A) (by C-G formula based on SCr of 1.23 mg/dL (H)).  No results for input(s): VANCOTROUGH, VANCOPEAK, VANCORANDOM, GENTTROUGH, GENTPEAK, GENTRANDOM, TOBRATROUGH, TOBRAPEAK, TOBRARND, AMIKACINPEAK, AMIKACINTROU, AMIKACIN in the last 72 hours.   Microbiology: Recent Results (from the past 720 hour(s))  MRSA PCR Screening     Status: None   Collection Time: 01/05/18  8:44 PM  Result Value Ref Range Status   MRSA by PCR NEGATIVE NEGATIVE Final    Comment:        The GeneXpert MRSA Assay (FDA approved for NASAL specimens only), is one component of a comprehensive MRSA colonization surveillance program. It is not intended to diagnose MRSA infection nor to guide or monitor treatment for MRSA infections. Performed at [facility], [address], [place], [place] [address]     Medical History: Past Medical History:  Diagnosis Date  . Acid reflux   . Anemia   . Arthritis    ra  . Asthma   . Cancer [facility])    breast cancer  . Coronary artery disease   . Diabetes mellitus without complication ([place])   . Diverticulitis   . Gastric ulcer   . Hyperlipidemia   . Hypertension   . Hypoxemia   . Kyphosis   . Kyphosis   . Mitral valve disorder   . Osteoporosis   . Oxygen dependent    2 liters at night  .  Raynaud disease   . Thrombocytopenia ([facility])     Medications:  Aztreonam 2 Gm IV x 1 ED dose  Assessment: 84 yo female brought to the ED for fever, weakness, and altered mental status. Labs show urinary tract  Infection and lactic acidosis. Sepsis protocol initiated. Pharmacy has been asked to provide aztreonam dosing.  Goal of Therapy:  Eradicate infection  Plan:  Preliminary review of pertinent patient information completed.  Protocol will be initiated with dose of Aztreonam 1 Gm IV 8 hours after the initial ED dose.[place] clinical pharmacist will complete review during morning rounds to assess patient and finalize treatment regimen if needed.  Norberto Sorenson, [facility] 01/06/2018,1:37 AM

## 2018-01-06 NOTE — Progress Notes (Signed)
Patient discussed with Dr Roderic Palau, known history of SVT admitted with sepsis. In setting of sepsis and soft bp's requiring limitation on av nodal agents she has had runs of SVT. One episode terminated with adenosine, but reoccurred. In setting of SVT and low bp's I have recommended amiodarone 150mg  bolus followed by 60mg /hr x 6 hrs, the 30mg /hr for 18 hrs. May rebolus amio additional 150mg  if needed. After 24 hrs if controlled would convert to oral amio 200mg  bid with plans for 3 weeks of treatment, then likely can d/c and switch back to her prior oral home regimen at outpatient f/u either with her pcp, her cardiologist if she has one, or can establish with [place] as new patient . If additional assistance needed this admission may contact us.   Carlyle Dolly MD

## 2018-01-06 NOTE — Progress Notes (Signed)
ANTIBIOTIC CONSULT NOTE-Preliminary  Pharmacy Consult for Aztreonam and Levaquin Indication: UTI/Urosepsis  Allergies  Allergen Reactions  . Aspirin Other (See Comments)    Cannot take uncoated due to stomach ulcers  . Megace [Megestrol]     abd pain   . Rocephin [Ceftriaxone Sodium In Dextrose] Swelling   Patient Measurements: Height: 5\' 4"  (162.6 cm) Weight: 102 lb 11.8 oz (46.6 kg) IBW/kg (Calculated) : 54.7  Vital Signs: Temp: 98.5 F (36.9 C) (04/03 0400) Temp Source: Oral (04/03 0400) BP: 101/69 (04/03 0700) Pulse Rate: 106 (04/03 0700)  Labs: Recent Labs    01/05/18 1643 01/06/18 0413  WBC 21.8* 23.9*  HGB 12.0 10.5*  PLT 281 213  CREATININE 1.23* 1.39*   Estimated Creatinine Clearance: 20.2 mL/min (A) (by C-G formula based on SCr of 1.39 mg/dL (H)).  No results for input(s): VANCOTROUGH, VANCOPEAK, VANCORANDOM, GENTTROUGH, GENTPEAK, GENTRANDOM, TOBRATROUGH, TOBRAPEAK, TOBRARND, AMIKACINPEAK, AMIKACINTROU, AMIKACIN in the last 72 hours.   Microbiology: Recent Results (from the past 720 hour(s))  Blood Culture (routine x 2)     Status: None (Preliminary result)   Collection Time: 01/05/18  4:44 PM  Result Value Ref Range Status   Specimen Description BLOOD RIGHT ARM  Final   Special Requests   Final    BOTTLES DRAWN AEROBIC ONLY Blood Culture results may not be optimal due to an inadequate volume of blood received in culture bottles   Culture   Final    NO GROWTH < 24 HOURS Performed at [facility], [address], [place], [place] [address]    Report Status PENDING  Incomplete  MRSA PCR Screening     Status: None   Collection Time: 01/05/18  8:44 PM  Result Value Ref Range Status   MRSA by PCR NEGATIVE NEGATIVE Final    Comment:        The GeneXpert MRSA Assay (FDA approved for NASAL specimens only), is one component of a comprehensive MRSA colonization surveillance program. It is not intended to diagnose MRSA infection nor to guide or monitor  treatment for MRSA infections. Performed at [facility], [address] [place], [place] [address]    Medical History: Past Medical History:  Diagnosis Date  . Acid reflux   . Anemia   . Arthritis    ra  . Asthma   . Cancer [facility])    breast cancer  . Coronary artery disease   . Diabetes mellitus without complication ([place])   . Diverticulitis   . Gastric ulcer   . Hyperlipidemia   . Hypertension   . Hypoxemia   . Kyphosis   . Kyphosis   . Mitral valve disorder   . Osteoporosis   . Oxygen dependent    2 liters at night  . Raynaud disease   . Thrombocytopenia ([facility])    Medications:  Aztreonam 2 Gm IV x 1 ED dose Levaquin 750mg  x 1 in ED  Assessment: 84 yo female brought to the ED for fever, weakness, and altered mental status. Labs show urinary tract  Infection and lactic acidosis. Sepsis protocol initiated. Pharmacy has been asked to provide aztreonam and Levaquin dosing.  Goal of Therapy:  Eradicate infection  Plan:  Levaquin 750mg  x 1 (given) then 500mg  IV q48hrs (renally adjusted) Aztreonam 2gm x 1 then 1gm IV q12hrs (renally adjusted) Preliminary review of pertinent patient information completed.  Protocol initiated. [place] clinical pharmacist will complete review during morning rounds to assess patient and finalize treatment regimen if needed.  Hart Robinsons  A, RPH 01/06/2018,7:49 AM

## 2018-01-06 NOTE — Progress Notes (Signed)
PROGRESS NOTE    [name]  ZOX:[id number] DOB: [birth year] DOA: 01/05/2018 PCP: Moshe Cipro, MD    Brief Narrative:  84 year old female with multiple medical problems, who lives at home with her family, brought to the hospital with fever and increasing lethargy.  Found to be dehydrated and have evidence of sepsis.  Source of infection felt to be possible urinary tract infection.  On IV antibiotics and IV fluids.   Assessment & Plan:   Principal Problem:   Sepsis secondary to UTI [facility]) Active Problems:   Rheumatoid arthritis ([place])   HTN (hypertension)   Diabetes type 2, controlled ([place])   GERD (gastroesophageal reflux disease)   Asthma, chronic   Lactic acidosis   Hypernatremia   Decubitus ulcer of thoracic spine area, stage III (HCC)   Decubitus ulcer of sacral region, stage 2   Anemia   1. Sepsis, likely secondary to urinary tract infection.  Started on intravenous antibiotics with levofloxacin.  Blood cultures and urine culture in process.  Patient remains hypotensive.  Continues to have low-grade fever this morning.  She will be continued on IV fluids.  Since she is on chronic prednisone, will check random cortisol and start on stress dose IV steroids.  Patient remains critically ill with persistent hypotension related to sepsis.  Will need to monitor very closely.  If blood pressure does not improve, may need PICC line placement for vasopressors. 2. Lactic acidosis.  Related to sepsis.  Trending down with IV fluids. 3. Hyponatremia.  Related to volume depletion.  Improving with IV fluids. 4. Hypertension.  Antihypertensives currently on hold due to hypotension.  She is chronically on Cardizem and metoprolol. 5. SVT.  Patient has a history of SVT and is on beta-blockers and calcium channel blockers.  These are currently on hold due to hypotension.  She is having episodes of tachycardia, but these are usually self-limited.  We will continue to monitor. 6. Diabetes.  Last A1c  of 9.9 in May/2018.  Repeat A1c has been ordered.  Continue on sliding scale insulin. 7. GERD.  Continue on Protonix 8. Stage II and III decubitus ulcer on sacral region and thoracic spine respectively.  Do not appear to be actively infected.  Wound care consult requested. 9. Hyperlipidemia.  Continue statin 10. Rheumatoid arthritis.  No evidence of joint flares at this time.  Continue on steroids. 11. Anemia, likely related to chronic disease.  No reported history of bleeding.  Baseline hemoglobin appears to run between 9-10.  Continue to monitor. 12. Asthma.  No shortness of breath.  Continue bronchodilators as needed.   DVT prophylaxis: lovenox Code Status: full code Family Communication: discussed with daughters at the bedside Disposition Plan: discharge home once infection has improved and hemodynamics have stabilized.   Consultants:     Procedures:     Antimicrobials:   Levofloxacin 4/2>   Subjective: Wakes up to voice. Denies any pain. Denies shortness of breath  Objective: Vitals:   01/06/18 0815 01/06/18 0900 01/06/18 0915 01/06/18 0930  BP: (!) 71/37 (!) 92/43 (!) 84/48 (!) 88/52  Pulse: (!) 105 (!) 103 (!) 103 (!) 106  Resp: (!) 22 (!) 23 (!) 22 (!) 23  Temp:      TempSrc:      SpO2: 97% 97% 97% 98%  Weight:      Height:        Intake/Output Summary (Last 24 hours) at 01/06/2018 0956 Last data filed at 01/06/2018 0500 Gross per 24 hour  Intake 3406.32 ml  Output 200 ml  Net 3206.32 ml   Filed Weights   01/05/18 1541 01/06/18 0500  Weight: 45.4 kg (100 lb) 46.6 kg (102 lb 11.8 oz)    Examination:  General exam: Appears calm and comfortable  Respiratory system: Clear to auscultation. Respiratory effort normal. Cardiovascular system: S1 & S2 heard, RRR. No JVD, murmurs, rubs, gallops or clicks.  Gastrointestinal system: Abdomen is nondistended, soft and nontender. No organomegaly or masses felt. Normal bowel sounds heard. Central nervous system: No  focal neurological deficits. Extremities: no edema bilaterally Skin: stage 2 and 3 decubitus ulcers on back, do not appear to be actively infected Psychiatry: confused, pleasant, wakes up to voice     Data Reviewed: I have personally reviewed following labs and imaging studies  CBC: Recent Labs  Lab 01/05/18 1643 01/06/18 0413  WBC 21.8* 23.9*  NEUTROABS 19.1* 20.0*  HGB 12.0 10.5*  HCT 39.8 35.1*  MCV 96.1 95.1  PLT 281 989   Basic Metabolic Panel: Recent Labs  Lab 01/05/18 1643 01/06/18 0413  NA 148* 142  K 5.2* 4.0  CL 106 109  CO2 26 24  GLUCOSE 74 146*  BUN 21* 24*  CREATININE 1.23* 1.39*  CALCIUM 9.5 7.9*   GFR: Estimated Creatinine Clearance: 20.2 mL/min (A) (by C-G formula based on SCr of 1.39 mg/dL (H)). Liver Function Tests: Recent Labs  Lab 01/05/18 1643  AST 23  ALT 11*  ALKPHOS 63  BILITOT 0.6  PROT 7.0  ALBUMIN 3.0*   No results for input(s): LIPASE, AMYLASE in the last 168 hours. No results for input(s): AMMONIA in the last 168 hours. Coagulation Profile: Recent Labs  Lab 01/05/18 1637  INR 0.97   Cardiac Enzymes: No results for input(s): CKTOTAL, CKMB, CKMBINDEX, TROPONINI in the last 168 hours. BNP (last 3 results) No results for input(s): PROBNP in the last 8760 hours. HbA1C: No results for input(s): HGBA1C in the last 72 hours. CBG: Recent Labs  Lab 01/05/18 1540 01/05/18 2222 01/06/18 0753  GLUCAP 150* 138* 123*   Lipid Profile: No results for input(s): CHOL, HDL, LDLCALC, TRIG, CHOLHDL, LDLDIRECT in the last 72 hours. Thyroid Function Tests: No results for input(s): TSH, T4TOTAL, FREET4, T3FREE, THYROIDAB in the last 72 hours. Anemia Panel: No results for input(s): VITAMINB12, FOLATE, FERRITIN, TIBC, IRON, RETICCTPCT in the last 72 hours. Sepsis Labs: Recent Labs  Lab 01/05/18 1711 01/05/18 2012 01/05/18 2349 01/06/18 0413  LATICACIDVEN 8.8* 1.8 3.3* 2.5*    Recent Results (from the past 240 hour(s))  Blood  Culture (routine x 2)     Status: None (Preliminary result)   Collection Time: 01/05/18  4:44 PM  Result Value Ref Range Status   Specimen Description BLOOD RIGHT ARM  Final   Special Requests   Final    BOTTLES DRAWN AEROBIC ONLY Blood Culture results may not be optimal due to an inadequate volume of blood received in culture bottles   Culture   Final    NO GROWTH < 24 HOURS Performed at [facility], [address], [place], [place] [address]    Report Status PENDING  Incomplete  MRSA PCR Screening     Status: None   Collection Time: 01/05/18  8:44 PM  Result Value Ref Range Status   MRSA by PCR NEGATIVE NEGATIVE Final    Comment:        The GeneXpert MRSA Assay (FDA approved for NASAL specimens only), is one component of a comprehensive MRSA colonization surveillance program. It is not  intended to diagnose MRSA infection nor to guide or monitor treatment for MRSA infections. Performed at [facility], [address], [place], [place] [address]          Radiology Studies: Ct Head Wo Contrast  Result Date: 01/05/2018 CLINICAL DATA:  Altered mental status. EXAM: CT HEAD WITHOUT CONTRAST TECHNIQUE: Contiguous axial images were obtained from the base of the skull through the vertex without intravenous contrast. COMPARISON:  None. FINDINGS: Brain: No evidence of acute infarction, hemorrhage, hydrocephalus, extra-axial collection or mass lesion/mass effect. Mild age related cerebral atrophy. Periventricular and subcortical white matter hypodensities are nonspecific but favored to reflect chronic microvascular ischemic changes. Vascular: Atherosclerotic vascular calcification of the carotid siphons. No hyperdense vessel. Skull: Negative for fracture or focal lesion. Sinuses/Orbits: No acute finding. Other: Partially visualized atrophy of the proximal cervical spinal cord. IMPRESSION: 1. Normal for age noncontrast head CT. 2. Partially visualized atrophy of the proximal cervical spinal  cord. Electronically Signed   By: Titus Dubin M.D.   On: 01/05/2018 17:24   Dg Chest Port 1 View  Result Date: 01/05/2018 CLINICAL DATA:  Fever with altered mental status EXAM: PORTABLE CHEST 1 VIEW COMPARISON:  Mar 04, 2017 FINDINGS: There is stable mild elevation of the left hemidiaphragm. There is no edema or consolidation. The heart size and pulmonary vascularity are normal. No adenopathy. There is aortic atherosclerosis. There are surgical clips in the left lateral breast/axillary region. IMPRESSION: No edema or consolidation. Heart size normal. There is aortic atherosclerosis. Aortic Atherosclerosis (ICD10-I70.0). Electronically Signed   By: Lowella Grip III M.D.   On: 01/05/2018 16:27        Scheduled Meds: . aspirin EC  81 mg Oral BID  . atorvastatin  10 mg Oral QPM  . cholecalciferol  2,000 Units Oral Daily  . ciclopirox   Topical Daily  . hydrocortisone sod succinate (SOLU-CORTEF) inj  100 mg Intravenous Q8H  . insulin glargine  5 Units Subcutaneous QHS  . iron polysaccharides  150 mg Oral Daily  . magnesium oxide  200 mg Oral Q48H   And  . [START ON 01/07/2018] magnesium oxide  400 mg Oral Q48H  . mometasone-formoterol  2 puff Inhalation BID  . multivitamin with minerals  1 tablet Oral q morning - 10a  . pantoprazole  40 mg Oral Daily  . polyvinyl alcohol  1 drop Both Eyes q morning - 10a  . raloxifene  60 mg Oral Daily  . tamoxifen  20 mg Oral Daily   Continuous Infusions: . sodium chloride    . [START ON 01/07/2018] levofloxacin (LEVAQUIN) IV       LOS: 1 day    Time spent: critical care time: 2mins spent reviewing records/labs, face to face time with patient and family and coordinating care with nursing staff    Kathie Dike, MD [facility] Pager [phone]  If 7PM-7AM, please contact night-coverage [email or web address] Password [facility] 01/06/2018, 9:56 AM

## 2018-01-06 NOTE — Progress Notes (Signed)
Critical Lactic Acid of 3.3 called by lab at 0052. Provider notified at [phone] via text page. Previous values were 8.8 then 1.8

## 2018-01-06 NOTE — Progress Notes (Signed)
Called to evaluate patient for tachycardia.  On my arrival, patient is lethargic, but does wake up to voice and answer questions.  Monitor indicates heart rate in the 140s and SVT.  Blood pressure in the low 90s to high 80s.  She received a dose of adenosine 6 mg IV x1 which broke SVT and return patient to sinus rhythm in the 70s-80s.  Unfortunately, this is very short-lived and she quickly returned back to SVT in the 140s.  Blood pressure improved when SVT broke with a systolic pressure 159 range.  She received 5 mg of Lopressor, but heart rate remains elevated in the 140 range.  Blood pressure in the high 80s at this point.  Case discussed with Dr. Harl Bowie, cardiology and recommendations are for intravenous amiodarone load and infusion.  Patient has a history of SVT in the past which was treated with adenosine and she was maintained on diltiazem/metoprolol.  Unfortunately these medications are not an option at this time due to low blood pressure.  SVT is likely being driven by sepsis.  Continue to treat sepsis with fluids and IV antibiotics.  She remains critically ill with hypotension and SVT.  Time was spent with face-to-face contact by the patient's bedside and I discussed treatment plan with patient's daughters.  Critical CARE time: 33 minutes  [employer]

## 2018-01-06 NOTE — Progress Notes (Signed)
RN to clarify "exchange PICC" order

## 2018-01-06 NOTE — Progress Notes (Signed)
Lactic acid called by lab at 0543 value is 2.5 less than previous and improving. MD notified via text page.

## 2018-01-06 NOTE — Progress Notes (Signed)
BP remains soft even after bolus order completed. Pt running 70s/40s. Daughter says the patient is usually 80s/50s. MD notified via text page.

## 2018-01-06 NOTE — Progress Notes (Signed)
New orders received for 1L bolus NS and bladder scan. Bladder scan revealed 373 cc urine in bladder.

## 2018-01-07 LAB — BASIC METABOLIC PANEL
ANION GAP: 9 (ref 5–15)
BUN: 22 mg/dL — ABNORMAL HIGH (ref 6–20)
CALCIUM: 6.9 mg/dL — AB (ref 8.9–10.3)
CHLORIDE: 112 mmol/L — AB (ref 101–111)
CO2: 20 mmol/L — AB (ref 22–32)
Creatinine, Ser: 0.96 mg/dL (ref 0.44–1.00)
GFR calc non Af Amer: 51 mL/min — ABNORMAL LOW (ref >60)
GFR, EST AFRICAN AMERICAN: 59 mL/min — AB (ref >60)
Glucose, Bld: 256 mg/dL — ABNORMAL HIGH (ref 65–99)
Potassium: 3.4 mmol/L — ABNORMAL LOW (ref 3.5–5.1)
Sodium: 141 mmol/L (ref 135–145)

## 2018-01-07 LAB — CBC WITH DIFFERENTIAL/PLATELET
BASOS ABS: 0 10*3/uL (ref 0.0–0.1)
BASOS PCT: 0 %
Eosinophils Absolute: 0 10*3/uL (ref 0.0–0.7)
Eosinophils Relative: 0 %
HCT: 27.8 % — ABNORMAL LOW (ref 36.0–46.0)
HEMOGLOBIN: 8.5 g/dL — AB (ref 12.0–15.0)
Lymphocytes Relative: 5 %
Lymphs Abs: 1.1 10*3/uL (ref 0.7–4.0)
MCH: 28.3 pg (ref 26.0–34.0)
MCHC: 30.6 g/dL (ref 30.0–36.0)
MCV: 92.7 fL (ref 78.0–100.0)
Monocytes Absolute: 0.5 10*3/uL (ref 0.1–1.0)
Monocytes Relative: 2 %
Neutro Abs: 20.5 10*3/uL — ABNORMAL HIGH (ref 1.7–7.7)
Neutrophils Relative %: 93 %
Platelets: 191 10*3/uL (ref 150–400)
RBC: 3 MIL/uL — AB (ref 3.87–5.11)
RDW: 13.9 % (ref 11.5–15.5)
WBC: 22.1 10*3/uL — ABNORMAL HIGH (ref 4.0–10.5)

## 2018-01-07 LAB — URINE CULTURE: Culture: 40000 — AB

## 2018-01-07 LAB — GLUCOSE, CAPILLARY
GLUCOSE-CAPILLARY: 303 mg/dL — AB (ref 65–99)
GLUCOSE-CAPILLARY: 262 mg/dL — AB (ref 65–99)
GLUCOSE-CAPILLARY: 296 mg/dL — AB (ref 65–99)
Glucose-Capillary: 232 mg/dL — ABNORMAL HIGH (ref 65–99)

## 2018-01-07 MED ORDER — AMIODARONE HCL 200 MG PO TABS: 200.0000 mg | ORAL_TABLET | Freq: Two times a day (BID) | ORAL | Status: DC

## 2018-01-07 MED ORDER — AMIODARONE HCL 200 MG PO TABS
200.0000 mg | ORAL_TABLET | Freq: Two times a day (BID) | ORAL | Status: DC
  Administered 2018-01-07 – 2018-01-11 (×8): 200 mg via ORAL
  Filled 2018-01-07 (×8): qty 1

## 2018-01-07 MED ORDER — MAGNESIUM SULFATE 4 GM/100ML IV SOLN
4.0000 g | Freq: Once | INTRAVENOUS | Status: AC
  Administered 2018-01-07: 4 g via INTRAVENOUS
  Filled 2018-01-07: qty 100

## 2018-01-07 MED ORDER — MAGNESIUM OXIDE 400 (241.3 MG) MG PO TABS
400.0000 mg | ORAL_TABLET | Freq: Every day | ORAL | Status: DC
  Administered 2018-01-07 – 2018-01-11 (×5): 400 mg via ORAL
  Filled 2018-01-07 (×5): qty 1

## 2018-01-07 MED ORDER — PREDNISONE 20 MG PO TABS
40.0000 mg | ORAL_TABLET | Freq: Every day | ORAL | Status: DC
  Administered 2018-01-08: 40 mg via ORAL
  Filled 2018-01-07: qty 2

## 2018-01-07 MED ORDER — POTASSIUM CHLORIDE 20 MEQ PO PACK
40.0000 meq | PACK | Freq: Once | ORAL | Status: AC
  Administered 2018-01-07: 40 meq via ORAL
  Filled 2018-01-07: qty 2

## 2018-01-07 NOTE — Progress Notes (Signed)
PROGRESS NOTE    [name]  JGG:[id number] DOB: 10/04/1926 DOA: 01/05/2018 PCP: Moshe Cipro, MD    Brief Narrative:  84 year old female with multiple medical problems, who lives at home with her family, brought to the hospital with fever and increasing lethargy.  Found to be dehydrated and have evidence of sepsis.  Source of infection felt to be possible urinary tract infection.  On IV antibiotics and IV fluids.   Assessment & Plan:   Principal Problem:   Sepsis secondary to UTI [facility]) Active Problems:   Rheumatoid arthritis ([name])   HTN (hypertension)   Diabetes type 2, controlled ([place])   GERD (gastroesophageal reflux disease)   Asthma, chronic   Lactic acidosis   Hypernatremia   Decubitus ulcer of thoracic spine area, stage III (HCC)   Decubitus ulcer of sacral region, stage 2   Anemia   SVT (supraventricular tachycardia) ([place])   1. Sepsis, likely secondary to urinary tract infection.  Started on intravenous antibiotics with levofloxacin.  Blood cultures and urine culture in process.  Overall hypotension is improving.  Fever curve improving.  She will be continued on IV fluids.  Since hemodynamics are improving, steroids will be tapered. 2. Lactic acidosis.  Related to sepsis.  Trending down with IV fluids. 3. Hyponatremia.  Related to volume depletion.  Improved with IV fluids. 4. Hypertension.  Antihypertensives currently on hold due to hypotension.  She is chronically on Cardizem and metoprolol. 5. SVT. Patient developed persistent SVT on 4/3 that terminated with adenosine, but then recurred. Due to low blood pressures, she was started on amiodarone infusion and has since converted to sinus rhythm. Heart rates have been stable overnight. She will be transitioned to po amiodarone 200mg  bid for 3 weeks, after which she can be transitioned back to cardizem/metoprolol . 6. Diabetes.  A1c 7.6. Blood sugars are elevated due to IV steroids.  Anticipate blood sugar should improve  as steroids are tapered.  Continue sliding scale insulin.. 7. GERD.  Continue on Protonix 8. Stage II and III decubitus ulcer on sacral region and thoracic spine respectively.  Do not appear to be actively infected.  Wound care consult requested. 9. Hyperlipidemia.  Continue statin 10. Rheumatoid arthritis.  No evidence of joint flares at this time.  Continue on steroids. 11. Anemia, likely related to chronic disease.  No reported history of bleeding.  Baseline hemoglobin appears to run between 9-10.  May also have a component of hemodilution. Continue to monitor. 12. Asthma.  No shortness of breath.  Continue bronchodilators as needed.   DVT prophylaxis: lovenox Code Status: full code Family Communication: discussed with daughters at the bedside Disposition Plan: discharge home once infection has improved and hemodynamics have stabilized.   Consultants:     Procedures:     Antimicrobials:   Levofloxacin 4/2>   Subjective: Denies any pain or shortness of breath  Objective: Vitals:   01/07/18 0900 01/07/18 1103 01/07/18 1600 01/07/18 1630  BP: (!) 105/55  119/82   Pulse: 96 89 84 91  Resp: (!) 22 (!) 21 (!) 0 (!) 21  Temp:  97.9 F (36.6 C)  98.4 F (36.9 C)  TempSrc:  Axillary  Oral  SpO2: 94% 95% 96% 95%  Weight:      Height:        Intake/Output Summary (Last 24 hours) at 01/07/2018 1638 Last data filed at 01/07/2018 1500 Gross per 24 hour  Intake 3473.7 ml  Output 1300 ml  Net 2173.7 ml   [employer]  01/05/18 1541 01/06/18 0500 01/07/18 0500  Weight: 45.4 kg (100 lb) 46.6 kg (102 lb 11.8 oz) 50.7 kg (111 lb 12.4 oz)    Examination:  General exam: Alert, awake, no distress Respiratory system: Clear to auscultation. Respiratory effort normal. Cardiovascular system:RRR. No murmurs, rubs, gallops. Gastrointestinal system: Abdomen is nondistended, soft and nontender. No organomegaly or masses felt. Normal bowel sounds heard. Central nervous system:  No  focal neurological deficits. Extremities: No C/C/E, +pedal pulses Psychiatry: confused but pleasant  Data Reviewed: I have personally reviewed following labs and imaging studies  CBC: Recent Labs  Lab 01/05/18 1643 01/06/18 0413 01/07/18 0525  WBC 21.8* 23.9* 22.1*  NEUTROABS 19.1* 20.0* 20.5*  HGB 12.0 10.5* 8.5*  HCT 39.8 35.1* 27.8*  MCV 96.1 95.1 92.7  PLT 281 213 160   Basic Metabolic Panel: Recent Labs  Lab 01/05/18 1643 01/06/18 0413 01/07/18 0525  NA 148* 142 141  K 5.2* 4.0 3.4*  CL 106 109 112*  CO2 26 24 20*  GLUCOSE 74 146* 256*  BUN 21* 24* 22*  CREATININE 1.23* 1.39* 0.96  CALCIUM 9.5 7.9* 6.9*  MG  --  1.3*  --    GFR: Estimated Creatinine Clearance: 31.8 mL/min (by C-G formula based on SCr of 0.96 mg/dL). Liver Function Tests: Recent Labs  Lab 01/05/18 1643  AST 23  ALT 11*  ALKPHOS 63  BILITOT 0.6  PROT 7.0  ALBUMIN 3.0*   No results for input(s): LIPASE, AMYLASE in the last 168 hours. No results for input(s): AMMONIA in the last 168 hours. Coagulation Profile: Recent Labs  Lab 01/05/18 1637  INR 0.97   Cardiac Enzymes: No results for input(s): CKTOTAL, CKMB, CKMBINDEX, TROPONINI in the last 168 hours. BNP (last 3 results) No results for input(s): PROBNP in the last 8760 hours. HbA1C: Recent Labs    01/06/18 0413  HGBA1C 7.6*   CBG: Recent Labs  Lab 01/06/18 1606 01/06/18 2136 01/07/18 0741 01/07/18 1102 01/07/18 1623  GLUCAP 264* 277* 232* 303* 262*   Lipid Profile: No results for input(s): CHOL, HDL, LDLCALC, TRIG, CHOLHDL, LDLDIRECT in the last 72 hours. Thyroid Function Tests: No results for input(s): TSH, T4TOTAL, FREET4, T3FREE, THYROIDAB in the last 72 hours. Anemia Panel: No results for input(s): VITAMINB12, FOLATE, FERRITIN, TIBC, IRON, RETICCTPCT in the last 72 hours. Sepsis Labs: Recent Labs  Lab 01/05/18 1711 01/05/18 2012 01/05/18 2349 01/06/18 0413  LATICACIDVEN 8.8* 1.8 3.3* 2.5*    Recent  Results (from the past 240 hour(s))  Urine culture     Status: Abnormal   Collection Time: 01/05/18  4:10 PM  Result Value Ref Range Status   Specimen Description   Final    URINE, CATHETERIZED Performed at [facility], [address] [place], [place] [address]    Special Requests   Final    NONE Performed at [facility], [address], [place],  [address]    Culture 40,000 COLONIES/mL YEAST (A)  Final   Report Status 01/07/2018 FINAL  Final  Blood Culture (routine x 2)     Status: None (Preliminary result)   Collection Time: 01/05/18  4:44 PM  Result Value Ref Range Status   Specimen Description BLOOD RIGHT ARM  Final   Special Requests   Final    BOTTLES DRAWN AEROBIC ONLY Blood Culture results may not be optimal due to an inadequate volume of blood received in culture bottles   Culture   Final    NO GROWTH 2 DAYS Performed  at [facility], [address] [place], [place] [address]    Report Status PENDING  Incomplete  MRSA PCR Screening     Status: None   Collection Time: 01/05/18  8:44 PM  Result Value Ref Range Status   MRSA by PCR NEGATIVE NEGATIVE Final    Comment:        The GeneXpert MRSA Assay (FDA approved for NASAL specimens only), is one component of a comprehensive MRSA colonization surveillance program. It is not intended to diagnose MRSA infection nor to guide or monitor treatment for MRSA infections. Performed at [facility], [address] [place], [place] [address]   Culture, blood (routine x 2)     Status: None (Preliminary result)   Collection Time: 01/06/18  9:55 AM  Result Value Ref Range Status   Specimen Description BLOOD LEFT HAND  Final   Special Requests   Final    BOTTLES DRAWN AEROBIC ONLY Blood Culture results may not be optimal due to an inadequate volume of blood received in culture bottles   Culture   Final    NO GROWTH < 24 HOURS Performed at [facility], [address], [place], [place] [address]    Report Status PENDING   Incomplete  Culture, blood (routine x 2)     Status: None (Preliminary result)   Collection Time: 01/06/18 10:00 AM  Result Value Ref Range Status   Specimen Description LEFT ANTECUBITAL  Final   Special Requests   Final    BOTTLES DRAWN AEROBIC ONLY Blood Culture results may not be optimal due to an inadequate volume of blood received in culture bottles   Culture   Final    NO GROWTH < 24 HOURS Performed at [facility], [address] [place],  [address]    Report Status PENDING  Incomplete         Radiology Studies: Ct Head Wo Contrast  Result Date: 01/05/2018 CLINICAL DATA:  Altered mental status. EXAM: CT HEAD WITHOUT CONTRAST TECHNIQUE: Contiguous axial images were obtained from the base of the skull through the vertex without intravenous contrast. COMPARISON:  None. FINDINGS: Brain: No evidence of acute infarction, hemorrhage, hydrocephalus, extra-axial collection or mass lesion/mass effect. Mild age related cerebral atrophy. Periventricular and subcortical white matter hypodensities are nonspecific but favored to reflect chronic microvascular ischemic changes. Vascular: Atherosclerotic vascular calcification of the carotid siphons. No hyperdense vessel. Skull: Negative for fracture or focal lesion. Sinuses/Orbits: No acute finding. Other: Partially visualized atrophy of the proximal cervical spinal cord. IMPRESSION: 1. Normal for age noncontrast head CT. 2. Partially visualized atrophy of the proximal cervical spinal cord. Electronically Signed   By: Titus Dubin M.D.   On: 01/05/2018 17:24        Scheduled Meds: . amiodarone  200 mg Oral BID  . aspirin EC  81 mg Oral BID  . atorvastatin  10 mg Oral QPM  . cholecalciferol  2,000 Units Oral Daily  . ciclopirox   Topical Daily  . enoxaparin (LOVENOX) injection  30 mg Subcutaneous Q24H  . insulin aspart  0-15 Units Subcutaneous TID WC  . insulin aspart  0-5 Units Subcutaneous QHS  . insulin glargine  5 Units  Subcutaneous QHS  . iron polysaccharides  150 mg Oral Daily  . magnesium oxide  400 mg Oral Daily  . mometasone-formoterol  2 puff Inhalation BID  . multivitamin with minerals  1 tablet Oral q morning - 10a  . pantoprazole  40 mg Oral Daily  . polyvinyl  alcohol  1 drop Both Eyes q morning - 10a  . potassium chloride  40 mEq Oral Once  . [START ON 01/08/2018] predniSONE  40 mg Oral Q breakfast  . raloxifene  60 mg Oral Daily  . tamoxifen  20 mg Oral Daily   Continuous Infusions: . sodium chloride 100 mL/hr at 01/07/18 0837  . levofloxacin (LEVAQUIN) IV       LOS: 2 days    Time spent: 2mins   Kathie Dike, MD [facility] Pager [phone]  If 7PM-7AM, please contact night-coverage [email or web address] Password [facility] 01/07/2018, 4:38 PM

## 2018-01-07 NOTE — Care Management Note (Addendum)
Case Management Note  Patient Details  Name: [name] MRN: [id number] Date of Birth: 1926-07-02  Subjective/Objective: Sepsis.   She is bedbound and total care. She has 24/7 caregivers pta. She has PCP, transportation and insurance with drug coverage. Family has all necessary DME to care for her               Action/Plan: [place] home with care of family and private duty sitters. CM following.  Expected Discharge Date:  01/08/18               Expected Discharge Plan:  Home/Self Care  In-House Referral:     Discharge planning Services  CM Consult  Post Acute Care Choice:    Choice offered to:     DME Arranged:    DME Agency:     HH Arranged:    HH Agency:     Status of Service:  In process, will continue to follow  If discussed at Long Length of Stay Meetings, dates discussed:    Additional Comments:  Demarus Latterell, Chauncey [place], RN 01/07/2018, 8:32 AM

## 2018-01-07 NOTE — Progress Notes (Signed)
Inpatient Diabetes Program Recommendations  AACE/ADA: New Consensus Statement on Inpatient Glycemic Control (2015)  Target Ranges:  Prepandial:   less than 140 mg/dL      Peak postprandial:   less than 180 mg/dL (1-2 hours)      Critically ill patients:  140 - 180 mg/dL   Results for MARSHA, GUNDLACH (MRN [id number]) as of 01/07/2018 07:24  Ref. Range 01/06/2018 07:53 01/06/2018 11:28 01/06/2018 16:06 01/06/2018 21:36  Glucose-Capillary Latest Ref Range: 65 - 99 mg/dL 123 (H) 130 (H) 264 (H) 277 (H)  Results for GWENDOLYNN, MERKEY (MRN [id number]) as of 01/07/2018 07:24  Ref. Range 01/06/2018 04:13 01/07/2018 05:25  Glucose Latest Ref Range: 65 - 99 mg/dL  256 (H)  Hemoglobin A1C Latest Ref Range: 4.8 - 5.6 % 7.6 (H)    Review of Glycemic Control  Diabetes history: DM2 Outpatient Diabetes medications: Lantus 5 units QHS, Novolog 1-10 units TID, Metformin 1000 mg BID Current orders for Inpatient glycemic control: Lantus 5 units QHS, Novolog 0-15 units TID with meals, Novolog 0-5 units QHS; Solucortef 100 mg Q8H  Inpatient Diabetes Program Recommendations:  Insulin - Basal: Please consider increasing Lantus to 7 units QHS. Insulin - Meal Coverage: Please consider ordering Novolog 2 units TID with meals for meal coverage if patient eats at least 50% of meals. HgbA1C: A1C 7.6% on 01/06/18 indicating an average glucose of 171 mg/dl over the past 2-3 months.  Thanks, Barnie Alderman, RN, MSN, CDE Diabetes Coordinator Inpatient Diabetes Program [phone] (Team Pager from 8am to 5pm)

## 2018-01-07 NOTE — Progress Notes (Signed)
ANTIBIOTIC CONSULT NOTE  Pharmacy Consult for Levaquin Indication: UTI/Urosepsis  Allergies  Allergen Reactions  . Aspirin Other (See Comments)    Cannot take uncoated due to stomach ulcers  . Megace [Megestrol]     abd pain   . Rocephin [Ceftriaxone Sodium In Dextrose] Swelling   Patient Measurements: Height: 5\' 4"  (162.6 cm) Weight: 111 lb 12.4 oz (50.7 kg) IBW/kg (Calculated) : 54.7  Vital Signs: Temp: 97.3 F (36.3 C) (04/04 0743) Temp Source: Axillary (04/04 0743) BP: 95/45 (04/04 0700) Pulse Rate: 79 (04/04 0743)  Labs: Recent Labs    01/05/18 1643 01/06/18 0413 01/07/18 0525  WBC 21.8* 23.9* 22.1*  HGB 12.0 10.5* 8.5*  PLT 281 213 191  CREATININE 1.23* 1.39* 0.96   Estimated Creatinine Clearance: 31.8 mL/min (by C-G formula based on SCr of 0.96 mg/dL).  No results for input(s): VANCOTROUGH, VANCOPEAK, VANCORANDOM, GENTTROUGH, GENTPEAK, GENTRANDOM, TOBRATROUGH, TOBRAPEAK, TOBRARND, AMIKACINPEAK, AMIKACINTROU, AMIKACIN in the last 72 hours.   Microbiology: Recent Results (from the past 720 hour(s))  Urine culture     Status: Abnormal   Collection Time: 01/05/18  4:10 PM  Result Value Ref Range Status   Specimen Description   Final    URINE, CATHETERIZED Performed at [facility], [address], [place], [place] [address]    Special Requests   Final    NONE Performed at [facility], [address] [place], [place] [address]    Culture 40,000 COLONIES/mL YEAST (A)  Final   Report Status 01/07/2018 FINAL  Final  Blood Culture (routine x 2)     Status: None (Preliminary result)   Collection Time: 01/05/18  4:44 PM  Result Value Ref Range Status   Specimen Description BLOOD RIGHT ARM  Final   Special Requests   Final    BOTTLES DRAWN AEROBIC ONLY Blood Culture results may not be optimal due to an inadequate volume of blood received in culture bottles   Culture   Final    NO GROWTH 2 DAYS Performed at [facility], [address], [place], [place]  [address]    Report Status PENDING  Incomplete  MRSA PCR Screening     Status: None   Collection Time: 01/05/18  8:44 PM  Result Value Ref Range Status   MRSA by PCR NEGATIVE NEGATIVE Final    Comment:        The GeneXpert MRSA Assay (FDA approved for NASAL specimens only), is one component of a comprehensive MRSA colonization surveillance program. It is not intended to diagnose MRSA infection nor to guide or monitor treatment for MRSA infections. Performed at [facility], [address] [place], [place] [address]   Culture, blood (routine x 2)     Status: None (Preliminary result)   Collection Time: 01/06/18  9:55 AM  Result Value Ref Range Status   Specimen Description BLOOD LEFT HAND  Final   Special Requests   Final    BOTTLES DRAWN AEROBIC ONLY Blood Culture results may not be optimal due to an inadequate volume of blood received in culture bottles   Culture   Final    NO GROWTH < 24 HOURS Performed at [facility], [address] [place], [place] [address]    Report Status PENDING  Incomplete  Culture, blood (routine x 2)     Status: None (Preliminary result)   Collection Time: 01/06/18 10:00 AM  Result Value Ref Range Status   Specimen Description LEFT ANTECUBITAL  Final   Special Requests   Final  BOTTLES DRAWN AEROBIC ONLY Blood Culture results may not be optimal due to an inadequate volume of blood received in culture bottles   Culture   Final    NO GROWTH < 24 HOURS Performed at [facility], [address], [place], [place] [address]    Report Status PENDING  Incomplete   Medical History: Past Medical History:  Diagnosis Date  . Acid reflux   . Anemia   . Arthritis    ra  . Asthma   . Cancer [facility])    breast cancer  . Coronary artery disease   . Diabetes mellitus without complication ([place])   . Diverticulitis   . Gastric ulcer   . Hyperlipidemia   . Hypertension   . Hypoxemia   . Kyphosis   . Kyphosis   . Mitral valve disorder   . Osteoporosis    . Oxygen dependent    2 liters at night  . Raynaud disease   . Thrombocytopenia (HCC)    Medications:  Aztreonam 2 Gm IV x 1 ED dose Levaquin 750mg  x 1 in ED  Assessment: 84 yo female brought to the ED for fever, weakness, and altered mental status. Labs show urinary tract  Infection and lactic acidosis. Sepsis protocol initiated. Pharmacy has been asked to provide Levaquin dosing.  Goal of Therapy:  Eradicate infection  Plan:  Levaquin 750mg  x 1 (given) then 500mg  IV q48hrs (renally adjusted) Monitor labs, progress, c/s  Hart Robinsons A, RPH 01/07/2018,10:12 AM

## 2018-01-08 LAB — CBC WITH DIFFERENTIAL/PLATELET
BASOS ABS: 0 10*3/uL (ref 0.0–0.1)
Basophils Relative: 0 %
Eosinophils Absolute: 0 10*3/uL (ref 0.0–0.7)
Eosinophils Relative: 0 %
HEMATOCRIT: 26.8 % — AB (ref 36.0–46.0)
HEMOGLOBIN: 8.5 g/dL — AB (ref 12.0–15.0)
LYMPHS ABS: 1.6 10*3/uL (ref 0.7–4.0)
LYMPHS PCT: 8 %
MCH: 28.9 pg (ref 26.0–34.0)
MCHC: 31.7 g/dL (ref 30.0–36.0)
MCV: 91.2 fL (ref 78.0–100.0)
Monocytes Absolute: 0.6 10*3/uL (ref 0.1–1.0)
Monocytes Relative: 3 %
NEUTROS ABS: 18.7 10*3/uL — AB (ref 1.7–7.7)
Neutrophils Relative %: 89 %
Platelets: 204 10*3/uL (ref 150–400)
RBC: 2.94 MIL/uL — AB (ref 3.87–5.11)
RDW: 13.9 % (ref 11.5–15.5)
WBC: 20.9 10*3/uL — AB (ref 4.0–10.5)

## 2018-01-08 LAB — BASIC METABOLIC PANEL
ANION GAP: 6 (ref 5–15)
BUN: 19 mg/dL (ref 6–20)
CHLORIDE: 113 mmol/L — AB (ref 101–111)
CO2: 20 mmol/L — AB (ref 22–32)
Calcium: 7 mg/dL — ABNORMAL LOW (ref 8.9–10.3)
Creatinine, Ser: 1 mg/dL (ref 0.44–1.00)
GFR calc Af Amer: 56 mL/min — ABNORMAL LOW (ref >60)
GFR, EST NON AFRICAN AMERICAN: 48 mL/min — AB (ref >60)
GLUCOSE: 198 mg/dL — AB (ref 65–99)
POTASSIUM: 3.3 mmol/L — AB (ref 3.5–5.1)
Sodium: 139 mmol/L (ref 135–145)

## 2018-01-08 LAB — GLUCOSE, CAPILLARY
GLUCOSE-CAPILLARY: 258 mg/dL — AB (ref 65–99)
GLUCOSE-CAPILLARY: 219 mg/dL — AB (ref 65–99)
Glucose-Capillary: 152 mg/dL — ABNORMAL HIGH (ref 65–99)
Glucose-Capillary: 207 mg/dL — ABNORMAL HIGH (ref 65–99)

## 2018-01-08 MED ORDER — PREDNISONE 20 MG PO TABS
20.0000 mg | ORAL_TABLET | Freq: Every day | ORAL | Status: DC
  Administered 2018-01-09: 20 mg via ORAL
  Filled 2018-01-08: qty 1

## 2018-01-08 MED ORDER — POTASSIUM CHLORIDE 20 MEQ PO PACK
40.0000 meq | PACK | Freq: Once | ORAL | Status: AC
  Administered 2018-01-08: 40 meq via ORAL
  Filled 2018-01-08 (×2): qty 2

## 2018-01-08 MED ORDER — ENOXAPARIN SODIUM 40 MG/0.4ML ~~LOC~~ SOLN
40.0000 mg | SUBCUTANEOUS | Status: DC
  Administered 2018-01-08 – 2018-01-10 (×3): 40 mg via SUBCUTANEOUS
  Filled 2018-01-08 (×3): qty 0.4

## 2018-01-08 NOTE — Progress Notes (Signed)
PROGRESS NOTE    [name]  TLX:[id number] DOB: 09-03-1926 DOA: 01/05/2018 PCP: Moshe Cipro, MD    Brief Narrative:  84 year old female with multiple medical problems, who lives at home with her family, brought to the hospital with fever and increasing lethargy.  Found to be dehydrated and have evidence of sepsis.  Source of infection felt to be possible urinary tract infection.  On IV antibiotics and IV fluids.   Assessment & Plan:   Principal Problem:   Sepsis secondary to UTI [facility]) Active Problems:   Rheumatoid arthritis ([place])   HTN (hypertension)   Diabetes type 2, controlled ([place])   GERD (gastroesophageal reflux disease)   Asthma, chronic   Lactic acidosis   Hypernatremia   Decubitus ulcer of thoracic spine area, stage III (HCC)   Decubitus ulcer of sacral region, stage 2   Anemia   SVT (supraventricular tachycardia) ([place])   1. Sepsis, likely secondary to urinary tract infection.  Started on intravenous antibiotics with levofloxacin.  Blood cultures and urine culture in process.  Overall hypotension is improving.  Fever curve improving.  She will be continued on IV fluids.  Since hemodynamics are improving, steroids will be tapered. 2. Lactic acidosis.  Related to sepsis.  Trending down with IV fluids. 3. Hyponatremia.  Related to volume depletion.  Improved with IV fluids. 4. Hypertension.  Antihypertensives currently on hold due to hypotension.  She is chronically on Cardizem and metoprolol. 5. SVT. Patient developed persistent SVT on 4/3 that terminated with adenosine, but then recurred. Due to low blood pressures, she was started on amiodarone infusion and has since converted to sinus rhythm. Heart rates have been stable. She has been transitioned to po amiodarone 200mg  bid for 3 weeks, after which she can be transitioned back to cardizem/metoprolol.  She will need cardiology follow-up after discharge 6. Diabetes.  A1c 7.6. Blood sugars are elevated due to steroids.   Anticipate blood sugar should improve as steroids are tapered.  Continue sliding scale insulin.. 7. GERD.  Continue on Protonix 8. Stage II and III decubitus ulcer on sacral region and thoracic spine respectively.  Do not appear to be actively infected.  Wound care consult appreciated. 9. Hyperlipidemia.  Continue statin 10. Rheumatoid arthritis.  No evidence of joint flares at this time.  Continue on steroids. 11. Anemia, likely related to chronic disease.  No reported history of bleeding.  Baseline hemoglobin appears to run between 9-10.  May also have a component of hemodilution.  Hemoglobin appears to be stable continue to monitor. 12. Asthma.  No shortness of breath.  Continue bronchodilators as needed.   DVT prophylaxis: lovenox Code Status: full code Family Communication: discussed with daughters at the bedside Disposition Plan: discharge home once infection has improved and hemodynamics have stabilized.   Consultants:     Procedures:     Antimicrobials:   Levofloxacin 4/2>   Subjective: Patient does not have any shortness of breath.  She denies any chest pain.  Appears to be in good spirits.  Objective: Vitals:   01/08/18 1200 01/08/18 1300 01/08/18 1400 01/08/18 1809  BP: (!) 137/109 117/65 (!) 121/59 130/80  Pulse: (!) 103 (!) 101 98 (!) 101  Resp: 20 19 (!) 23 19  Temp:    98.8 F (37.1 C)  TempSrc:    Oral  SpO2: 96% 96% 97% 99%  Weight:      Height:        Intake/Output Summary (Last 24 hours) at 01/08/2018 1940 Last data filed  at 01/08/2018 1800 Gross per 24 hour  Intake 1842.5 ml  Output 175 ml  Net 1667.5 ml   Filed Weights   01/06/18 0500 01/07/18 0500 01/08/18 0400  Weight: 46.6 kg (102 lb 11.8 oz) 50.7 kg (111 lb 12.4 oz) 52.5 kg (115 lb 11.9 oz)    Examination:  General exam: Alert, awake, oriented x 3 Respiratory system: Clear to auscultation. Respiratory effort normal. Cardiovascular system:RRR. No murmurs, rubs,  gallops. Gastrointestinal system: Abdomen is nondistended, soft and nontender. No organomegaly or masses felt. Normal bowel sounds heard. Central nervous system: Limited exam due to baseline deficits.  Right upper extremity is contracted.  She is diffusely weak and cannot participate in exam.  She is total care. Extremities: Right upper extremity is contracted Skin: No rashes, lesions or ulcers Psychiatry: Confused, pleasant  Data Reviewed: I have personally reviewed following labs and imaging studies  CBC: Recent Labs  Lab 01/05/18 1643 01/06/18 0413 01/07/18 0525 01/08/18 0427  WBC 21.8* 23.9* 22.1* 20.9*  NEUTROABS 19.1* 20.0* 20.5* 18.7*  HGB 12.0 10.5* 8.5* 8.5*  HCT 39.8 35.1* 27.8* 26.8*  MCV 96.1 95.1 92.7 91.2  PLT 281 213 191 417   Basic Metabolic Panel: Recent Labs  Lab 01/05/18 1643 01/06/18 0413 01/07/18 0525 01/08/18 0427  NA 148* 142 141 139  K 5.2* 4.0 3.4* 3.3*  CL 106 109 112* 113*  CO2 26 24 20* 20*  GLUCOSE 74 146* 256* 198*  BUN 21* 24* 22* 19  CREATININE 1.23* 1.39* 0.96 1.00  CALCIUM 9.5 7.9* 6.9* 7.0*  MG  --  1.3*  --   --    GFR: Estimated Creatinine Clearance: 31.6 mL/min (by C-G formula based on SCr of 1 mg/dL). Liver Function Tests: Recent Labs  Lab 01/05/18 1643  AST 23  ALT 11*  ALKPHOS 63  BILITOT 0.6  PROT 7.0  ALBUMIN 3.0*   No results for input(s): LIPASE, AMYLASE in the last 168 hours. No results for input(s): AMMONIA in the last 168 hours. Coagulation Profile: Recent Labs  Lab 01/05/18 1637  INR 0.97   Cardiac Enzymes: No results for input(s): CKTOTAL, CKMB, CKMBINDEX, TROPONINI in the last 168 hours. BNP (last 3 results) No results for input(s): PROBNP in the last 8760 hours. HbA1C: Recent Labs    01/06/18 0413  HGBA1C 7.6*   CBG: Recent Labs  Lab 01/07/18 1623 01/07/18 2209 01/08/18 0754 01/08/18 1150 01/08/18 1656  GLUCAP 262* 296* 152* 207* 258*   Lipid Profile: No results for input(s): CHOL,  HDL, LDLCALC, TRIG, CHOLHDL, LDLDIRECT in the last 72 hours. Thyroid Function Tests: No results for input(s): TSH, T4TOTAL, FREET4, T3FREE, THYROIDAB in the last 72 hours. Anemia Panel: No results for input(s): VITAMINB12, FOLATE, FERRITIN, TIBC, IRON, RETICCTPCT in the last 72 hours. Sepsis Labs: Recent Labs  Lab 01/05/18 1711 01/05/18 2012 01/05/18 2349 01/06/18 0413  LATICACIDVEN 8.8* 1.8 3.3* 2.5*    Recent Results (from the past 240 hour(s))  Urine culture     Status: Abnormal   Collection Time: 01/05/18  4:10 PM  Result Value Ref Range Status   Specimen Description   Final    URINE, CATHETERIZED Performed at [facility], [address] [place], [place] [address]    Special Requests   Final    NONE Performed at [facility], [address], [place], [place] [address]    Culture 40,000 COLONIES/mL YEAST (A)  Final   Report Status 01/07/2018 FINAL  Final  Blood Culture (routine x 2)  Status: None (Preliminary result)   Collection Time: 01/05/18  4:44 PM  Result Value Ref Range Status   Specimen Description BLOOD RIGHT ARM  Final   Special Requests   Final    BOTTLES DRAWN AEROBIC ONLY Blood Culture results may not be optimal due to an inadequate volume of blood received in culture bottles   Culture   Final    NO GROWTH 3 DAYS Performed at [facility], [address] [place], [place] [address]    Report Status PENDING  Incomplete  MRSA PCR Screening     Status: None   Collection Time: 01/05/18  8:44 PM  Result Value Ref Range Status   MRSA by PCR NEGATIVE NEGATIVE Final    Comment:        The GeneXpert MRSA Assay (FDA approved for NASAL specimens only), is one component of a comprehensive MRSA colonization surveillance program. It is not intended to diagnose MRSA infection nor to guide or monitor treatment for MRSA infections. Performed at [facility], [address], [place], [place] [address]   Culture, blood (routine x 2)     Status: None (Preliminary  result)   Collection Time: 01/06/18  9:55 AM  Result Value Ref Range Status   Specimen Description BLOOD LEFT HAND  Final   Special Requests   Final    BOTTLES DRAWN AEROBIC ONLY Blood Culture results may not be optimal due to an inadequate volume of blood received in culture bottles   Culture   Final    NO GROWTH 2 DAYS Performed at [facility], [address] [place], [place] [address]    Report Status PENDING  Incomplete  Culture, blood (routine x 2)     Status: None (Preliminary result)   Collection Time: 01/06/18 10:00 AM  Result Value Ref Range Status   Specimen Description LEFT ANTECUBITAL  Final   Special Requests   Final    BOTTLES DRAWN AEROBIC ONLY Blood Culture results may not be optimal due to an inadequate volume of blood received in culture bottles   Culture   Final    NO GROWTH 2 DAYS Performed at [facility], [address] [place], [place] [address]    Report Status PENDING  Incomplete         Radiology Studies: No results found.      Scheduled Meds: . amiodarone  200 mg Oral BID  . aspirin EC  81 mg Oral BID  . atorvastatin  10 mg Oral QPM  . cholecalciferol  2,000 Units Oral Daily  . ciclopirox   Topical Daily  . enoxaparin (LOVENOX) injection  40 mg Subcutaneous Q24H  . insulin aspart  0-15 Units Subcutaneous TID WC  . insulin aspart  0-5 Units Subcutaneous QHS  . insulin glargine  5 Units Subcutaneous QHS  . iron polysaccharides  150 mg Oral Daily  . magnesium oxide  400 mg Oral Daily  . mometasone-formoterol  2 puff Inhalation BID  . multivitamin with minerals  1 tablet Oral q morning - 10a  . pantoprazole  40 mg Oral Daily  . polyvinyl alcohol  1 drop Both Eyes q morning - 10a  . [START ON 01/09/2018] predniSONE  20 mg Oral Q breakfast  . raloxifene  60 mg Oral Daily  . tamoxifen  20 mg Oral Daily   Continuous Infusions: . sodium chloride 10 mL/hr at 01/08/18 1325  . levofloxacin (LEVAQUIN) IV Stopped (01/07/18 1838)     LOS: 3 days  Kathie Dike, MD [facility] Pager [phone]  If 7PM-7AM, please contact night-coverage [email or web address] Password [email or web address] 01/08/2018, 7:40 PM

## 2018-01-08 NOTE — Consult Note (Signed)
[place] Nurse wound consult note Consult completed with assistance of remote camera and patient's primary RN and a second RN for turning and positioning.  Reason for Consult: pressure injury Wound type: Thoracic back is a stage 3 pressure injury.  The areas on the sacrum and left buttock are related to friction, shear, exposure to urine and feces and is moisture related skin damage (MASD).  These two areas are 100% pink, clean, superficial without odor.  Surrounding skin is consistent with MASD, and blanches.  Pressure Injury POA: Yes Measurement: Please refer to the flowsheet  Wound bed: The thoracic back is dark pink, no drainage, no slough and plan of care is for lightly packing with xeroform gauze Kellie Simmering 295) and cover with a foam dressing and change every 3 days and prn soilage. The plan for the sacral and buttock areas are either [employer] hydrocolloid Kellie Simmering # 652) and change every 4 days and prn; or (2) Criticaid clear (available on the supply shelves) and a foam dressing.  Change every 3 days and prn soilage.  Additionally, I have entered turning orders for the patient to be turned side to side every 2 hours and to avoid being on her back to the greatest extent possible. Thank you for the consult.  Discussed plan of care with the patient and bedside nurse.  [place] nurse will not follow at this time.  Please re-consult the [place] team if needed.  Val Riles, RN, MSN, CWOCN, CNS-BC, pager [phone]

## 2018-01-08 NOTE — Progress Notes (Signed)
Patient taken to 300 via bed.  Report given to 300 nurse.

## 2018-01-08 NOTE — Care Management Important Message (Signed)
Important Message  Patient Details  Name: [name] MRN: [id number] Date of Birth: 1926-03-23   Medicare Important Message Given:  Yes    Shelda Altes 01/08/2018, 10:21 AM

## 2018-01-09 DIAGNOSIS — I10 Essential (primary) hypertension: Secondary | ICD-10-CM

## 2018-01-09 LAB — BASIC METABOLIC PANEL
ANION GAP: 7 (ref 5–15)
BUN: 18 mg/dL (ref 6–20)
CALCIUM: 7.8 mg/dL — AB (ref 8.9–10.3)
CO2: 20 mmol/L — ABNORMAL LOW (ref 22–32)
Chloride: 111 mmol/L (ref 101–111)
Creatinine, Ser: 1.01 mg/dL — ABNORMAL HIGH (ref 0.44–1.00)
GFR calc non Af Amer: 48 mL/min — ABNORMAL LOW (ref >60)
GFR, EST AFRICAN AMERICAN: 55 mL/min — AB (ref >60)
GLUCOSE: 105 mg/dL — AB (ref 65–99)
POTASSIUM: 5.3 mmol/L — AB (ref 3.5–5.1)
SODIUM: 138 mmol/L (ref 135–145)

## 2018-01-09 LAB — GLUCOSE, CAPILLARY
GLUCOSE-CAPILLARY: 104 mg/dL — AB (ref 65–99)
GLUCOSE-CAPILLARY: 136 mg/dL — AB (ref 65–99)
GLUCOSE-CAPILLARY: 305 mg/dL — AB (ref 65–99)
Glucose-Capillary: 46 mg/dL — ABNORMAL LOW (ref 65–99)
Glucose-Capillary: 61 mg/dL — ABNORMAL LOW (ref 65–99)
Glucose-Capillary: 241 mg/dL — ABNORMAL HIGH (ref 65–99)

## 2018-01-09 LAB — CBC
HCT: 29.6 % — ABNORMAL LOW (ref 36.0–46.0)
Hemoglobin: 9.3 g/dL — ABNORMAL LOW (ref 12.0–15.0)
MCH: 28.4 pg (ref 26.0–34.0)
MCHC: 31.4 g/dL (ref 30.0–36.0)
MCV: 90.2 fL (ref 78.0–100.0)
PLATELETS: 217 10*3/uL (ref 150–400)
RBC: 3.28 MIL/uL — AB (ref 3.87–5.11)
RDW: 13.9 % (ref 11.5–15.5)
WBC: 24.9 10*3/uL — AB (ref 4.0–10.5)

## 2018-01-09 LAB — MAGNESIUM: MAGNESIUM: 2.5 mg/dL — AB (ref 1.7–2.4)

## 2018-01-09 MED ORDER — TRAMADOL HCL 50 MG PO TABS: 50.0000 mg | ORAL_TABLET | Freq: Four times a day (QID) | ORAL | Status: DC | PRN

## 2018-01-09 MED ORDER — AZTREONAM 1 G IJ SOLR
1.0000 g | Freq: Three times a day (TID) | INTRAMUSCULAR | Status: DC
  Administered 2018-01-09 – 2018-01-11 (×7): 1 g via INTRAVENOUS
  Filled 2018-01-09 (×11): qty 1

## 2018-01-09 MED ORDER — POTASSIUM CHLORIDE 20 MEQ PO PACK
40.0000 meq | PACK | Freq: Once | ORAL | Status: AC
Start: 2018-01-09 — End: 2018-01-09
  Administered 2018-01-09: 40 meq via ORAL
  Filled 2018-01-09: qty 2

## 2018-01-09 MED ORDER — VANCOMYCIN HCL IN DEXTROSE 1-5 GM/200ML-% IV SOLN
1000.0000 mg | Freq: Once | INTRAVENOUS | Status: AC
  Administered 2018-01-09: 1000 mg via INTRAVENOUS
  Filled 2018-01-09: qty 200

## 2018-01-09 MED ORDER — FLUCONAZOLE 100 MG PO TABS
100.0000 mg | ORAL_TABLET | Freq: Every day | ORAL | Status: DC
  Administered 2018-01-09 – 2018-01-11 (×3): 100 mg via ORAL
  Filled 2018-01-09 (×3): qty 1

## 2018-01-09 MED ORDER — ENSURE ENLIVE PO LIQD: 237.0000 mL | Freq: Two times a day (BID) | ORAL | Status: DC

## 2018-01-09 MED ORDER — TRAZODONE HCL 50 MG PO TABS
25.0000 mg | ORAL_TABLET | Freq: Every evening | ORAL | Status: DC | PRN
  Administered 2018-01-10: 25 mg via ORAL
  Filled 2018-01-09: qty 1

## 2018-01-09 MED ORDER — VANCOMYCIN HCL IN DEXTROSE 750-5 MG/150ML-% IV SOLN
750.0000 mg | INTRAVENOUS | Status: DC
  Administered 2018-01-10: 750 mg via INTRAVENOUS
  Filled 2018-01-09 (×2): qty 150

## 2018-01-09 MED ORDER — METOPROLOL TARTRATE 5 MG/5ML IV SOLN
2.5000 mg | Freq: Once | INTRAVENOUS | Status: AC
Start: 2018-01-09 — End: 2018-01-09
  Administered 2018-01-09: 2.5 mg via INTRAVENOUS
  Filled 2018-01-09: qty 5

## 2018-01-09 MED ORDER — PRO-STAT SUGAR FREE PO LIQD
30.0000 mL | Freq: Two times a day (BID) | ORAL | Status: DC
  Administered 2018-01-09 – 2018-01-11 (×4): 30 mL via ORAL
  Filled 2018-01-09 (×4): qty 30

## 2018-01-09 MED ORDER — METOPROLOL TARTRATE 25 MG PO TABS
12.5000 mg | ORAL_TABLET | Freq: Two times a day (BID) | ORAL | Status: DC
  Administered 2018-01-09 – 2018-01-11 (×5): 12.5 mg via ORAL
  Filled 2018-01-09 (×5): qty 1

## 2018-01-09 MED ORDER — INSULIN ASPART 100 UNIT/ML ~~LOC~~ SOLN
0.0000 [IU] | Freq: Three times a day (TID) | SUBCUTANEOUS | Status: DC
  Administered 2018-01-09: 2 [IU] via SUBCUTANEOUS
  Administered 2018-01-09: 3 [IU] via SUBCUTANEOUS
  Administered 2018-01-10: 5 [IU] via SUBCUTANEOUS
  Administered 2018-01-10: 1 [IU] via SUBCUTANEOUS
  Administered 2018-01-10: 3 [IU] via SUBCUTANEOUS
  Administered 2018-01-11: 1 [IU] via SUBCUTANEOUS
  Administered 2018-01-11: 5 [IU] via SUBCUTANEOUS

## 2018-01-09 MED ORDER — PREDNISONE 10 MG PO TABS
10.0000 mg | ORAL_TABLET | Freq: Every day | ORAL | Status: DC
  Administered 2018-01-10 – 2018-01-11 (×2): 10 mg via ORAL
  Filled 2018-01-09 (×2): qty 1

## 2018-01-09 NOTE — Progress Notes (Addendum)
Pharmacy Antibiotic Note  [name] is a 84 y.o. female admitted on 01/05/2018 with sepsis.  Pharmacy has been consulted for Vancomycin and aztreonam dosing. Pt afebrile, but WBC remains elevated.   Plan: Vancomycin 1000mg , then 750mg  IV every 24 hours.  Goal trough 15-20 mcg/mL.  Aztreonam 1gm IV q8h F/U cxs and clinical progress Monitor V/S, labs and levels as indicated  Height: 5\' 4"  (162.6 cm) Weight: 115 lb 11.9 oz (52.5 kg) IBW/kg (Calculated) : 54.7  Temp (24hrs), Avg:98.5 F (36.9 C), Min:98.2 F (36.8 C), Max:98.8 F (37.1 C)  Recent Labs  Lab 01/05/18 1643 01/05/18 1711 01/05/18 2012 01/05/18 2349 01/06/18 0413 01/07/18 0525 01/08/18 0427 01/09/18 0216  WBC 21.8*  --   --   --  23.9* 22.1* 20.9* 24.9*  CREATININE 1.23*  --   --   --  1.39* 0.96 1.00 1.01*  LATICACIDVEN  --  8.8* 1.8 3.3* 2.5*  --   --   --     Estimated Creatinine Clearance: 31.3 mL/min (A) (by C-G formula based on SCr of 1.01 mg/dL (H)).    Allergies  Allergen Reactions  . Aspirin Other (See Comments)    Cannot take uncoated due to stomach ulcers  . Megace [Megestrol]     abd pain   . Rocephin [Ceftriaxone Sodium In Dextrose] Swelling    Antimicrobials this admission: Vancomycin  4/6 >>   Aztreonam 4/2>> 4/3 restarted 4/6>> Levaquin 4/2>>4/6  Dose adjustments this admission: N/A  Microbiology results: 4/3 Bcx: ngtd 4/3 UCx: yeast  4/2 MRSA PCR neg  Thank you for allowing pharmacy to be a part of this patient's care.  Isac Sarna, BS Pharm D, [place] Clinical Pharmacist Pager [phone] 01/09/2018 8:57 AM

## 2018-01-09 NOTE — Progress Notes (Signed)
PROGRESS NOTE    [name]  ELF:[id number] DOB: 1926-08-28 DOA: 01/05/2018 PCP: Moshe Cipro, MD    Brief Narrative:  84 year old female with multiple medical problems, who lives at home with her family, brought to the hospital with fever and increasing lethargy.  Found to be dehydrated and have evidence of sepsis.  Source of infection felt to be possible urinary tract infection.  On IV antibiotics and IV fluids.   Assessment & Plan:   Principal Problem:   Sepsis secondary to UTI [facility]) Active Problems:   Rheumatoid arthritis ([place])   HTN (hypertension)   Diabetes type 2, controlled ([place])   GERD (gastroesophageal reflux disease)   Asthma, chronic   Lactic acidosis   Hypernatremia   Decubitus ulcer of thoracic spine area, stage III ([facility])   Decubitus ulcer of sacral region, stage 2   Anemia   SVT (supraventricular tachycardia) ([place])   1. Sepsis, likely secondary to urinary tract infection.  Started on intravenous antibiotics with levofloxacin and now with rising WBC have broadened coverage pending culture results.  Blood cultures and urine culture in process.  Overall hypotension is improving.  Fever curve improving.  2. Lactic acidosis.  Resolved now.  Related to sepsis.  Trended down with IV fluids. 3. Hyponatremia.  Resolved now.  Related to volume depletion.  Improved with IV fluids. 4. Hypertension.  Antihypertensives currently on hold due to hypotension.  She is chronically on Cardizem and metoprolol. 5. SVT. She has another recurrence 4/6.   Patient developed persistent SVT on 4/3 that terminated with adenosine, but then recurred. Due to low blood pressures, she was started on amiodarone infusion and has since converted to sinus hythm. Heart rates have been stable. She has been transitioned to po amiodarone 200mg  bid for 3 weeks, after rwhich she can be transitioned back to cardizem/metoprolol.  She will need cardiology follow-up after discharge.  Because she had recurrence  today, will restart low dose metoprolol 12.5 mg BID and monitor BPs to make sure she tolerates it.  Her BPs have improved markedly.  6. Diabetes.  A1c 7.6. Blood sugars are elevated due to steroids.  Anticipate blood sugar should improve as steroids are tapered.  Continue sliding scale insulin. Weaning steroids.  7. GERD.  Continue on Protonix 8. Stage II and III decubitus ulcer on sacral region and thoracic spine respectively.  Do not appear to be actively infected.  Wound care consult appreciated. 9. Hyperlipidemia.  Continue statin therapy.  10. Rheumatoid arthritis.  No evidence of joint flares at this time.  Continue on steroids. 11. Anemia, likely related to chronic disease.  No reported history of bleeding.  Baseline hemoglobin appears to run between 9-10.  May also have a component of hemodilution.  Hemoglobin appears to be stable continue to monitor. 12. Asthma.  No shortness of breath.  Continue bronchodilators as needed.  DVT prophylaxis: lovenox Code Status: full code Family Communication: discussed with daughters at the bedside Disposition Plan: discharge home once infection has improved and hemodynamics have stabilized.   Consultants:     Procedures:     Antimicrobials:   Levofloxacin 4/2>  Subjective: Patient reports no palpitations, she says that she is starting to feel better.    Objective: Vitals:   01/09/18 0128 01/09/18 0659 01/09/18 0740 01/09/18 0806  BP: 114/81 127/65    Pulse:  98    Resp:      Temp:  98.6 F (37 C)    TempSrc:  Oral    SpO2:  96% 100% 97%  Weight:      Height:        Intake/Output Summary (Last 24 hours) at 01/09/2018 1202 Last data filed at 01/09/2018 0900 Gross per 24 hour  Intake 922.5 ml  Output -  Net 922.5 ml   Filed Weights   01/06/18 0500 01/07/18 0500 01/08/18 0400  Weight: 46.6 kg (102 lb 11.8 oz) 50.7 kg (111 lb 12.4 oz) 52.5 kg (115 lb 11.9 oz)    Examination:  General exam: Alert, awake, oriented x 3, NAD.  Cooperative.  Respiratory system: Clear to auscultation. Respiratory effort normal. Cardiovascular system: normal s1,s2 sounds.  No murmurs, rubs, gallops. Gastrointestinal system: Abdomen is nondistended, soft and nontender. No organomegaly or masses felt. Normal bowel sounds heard. Central nervous system: Limited exam due to baseline deficits.  Right upper extremity is contracted. Extremities: Right upper extremity is contracted and edematous and arthritic changes noted of both hands.  Skin: No rashes, lesions or ulcers Psychiatry: flat affect. pleasant.    Data Reviewed: I have personally reviewed following labs and imaging studies  CBC: Recent Labs  Lab 01/05/18 1643 01/06/18 0413 01/07/18 0525 01/08/18 0427 01/09/18 0216  WBC 21.8* 23.9* 22.1* 20.9* 24.9*  NEUTROABS 19.1* 20.0* 20.5* 18.7*  --   HGB 12.0 10.5* 8.5* 8.5* 9.3*  HCT 39.8 35.1* 27.8* 26.8* 29.6*  MCV 96.1 95.1 92.7 91.2 90.2  PLT 281 213 191 204 229   Basic Metabolic Panel: Recent Labs  Lab 01/05/18 1643 01/06/18 0413 01/07/18 0525 01/08/18 0427 01/09/18 0216  NA 148* 142 141 139 138  K 5.2* 4.0 3.4* 3.3* 5.3*  CL 106 109 112* 113* 111  CO2 26 24 20* 20* 20*  GLUCOSE 74 146* 256* 198* 105*  BUN 21* 24* 22* 19 18  CREATININE 1.23* 1.39* 0.96 1.00 1.01*  CALCIUM 9.5 7.9* 6.9* 7.0* 7.8*  MG  --  1.3*  --   --  2.5*   GFR: Estimated Creatinine Clearance: 31.3 mL/min (A) (by C-G formula based on SCr of 1.01 mg/dL (H)). Liver Function Tests: Recent Labs  Lab 01/05/18 1643  AST 23  ALT 11*  ALKPHOS 63  BILITOT 0.6  PROT 7.0  ALBUMIN 3.0*   No results for input(s): LIPASE, AMYLASE in the last 168 hours. No results for input(s): AMMONIA in the last 168 hours. Coagulation Profile: Recent Labs  Lab 01/05/18 1637  INR 0.97   Cardiac Enzymes: No results for input(s): CKTOTAL, CKMB, CKMBINDEX, TROPONINI in the last 168 hours. BNP (last 3 results) No results for input(s): PROBNP in the last 8760  hours. HbA1C: No results for input(s): HGBA1C in the last 72 hours. CBG: Recent Labs  Lab 01/08/18 2053 01/09/18 0722 01/09/18 0748 01/09/18 0834 01/09/18 1135  GLUCAP 219* 46* 61* 104* 136*   Lipid Profile: No results for input(s): CHOL, HDL, LDLCALC, TRIG, CHOLHDL, LDLDIRECT in the last 72 hours. Thyroid Function Tests: No results for input(s): TSH, T4TOTAL, FREET4, T3FREE, THYROIDAB in the last 72 hours. Anemia Panel: No results for input(s): VITAMINB12, FOLATE, FERRITIN, TIBC, IRON, RETICCTPCT in the last 72 hours. Sepsis Labs: Recent Labs  Lab 01/05/18 1711 01/05/18 2012 01/05/18 2349 01/06/18 0413  LATICACIDVEN 8.8* 1.8 3.3* 2.5*    Recent Results (from the past 240 hour(s))  Urine culture     Status: Abnormal   Collection Time: 01/05/18  4:10 PM  Result Value Ref Range Status   Specimen Description   Final    URINE, CATHETERIZED Performed at [facility], [address], [place], [place] [address]    Special Requests   Final    NONE Performed at [facility], [address] [place], [place] [address]    Culture 40,000 COLONIES/mL YEAST (A)  Final   Report Status 01/07/2018 FINAL  Final  Blood Culture (routine x 2)     Status: None (Preliminary result)   Collection Time: 01/05/18  4:44 PM  Result Value Ref Range Status   Specimen Description BLOOD RIGHT ARM  Final   Special Requests   Final    BOTTLES DRAWN AEROBIC ONLY Blood Culture results may not be optimal due to an inadequate volume of blood received in culture bottles   Culture   Final    NO GROWTH 3 DAYS Performed at [facility], [address] [place], [place] [address]    Report Status PENDING  Incomplete  MRSA PCR Screening     Status: None   Collection Time: 01/05/18  8:44 PM  Result Value Ref Range Status   MRSA by PCR NEGATIVE NEGATIVE Final    Comment:        The GeneXpert MRSA Assay (FDA approved for NASAL specimens only), is one component of a comprehensive MRSA  colonization surveillance program. It is not intended to diagnose MRSA infection nor to guide or monitor treatment for MRSA infections. Performed at [facility], [address] [place], [place] [address]   Culture, blood (routine x 2)     Status: None (Preliminary result)   Collection Time: 01/06/18  9:55 AM  Result Value Ref Range Status   Specimen Description BLOOD LEFT HAND  Final   Special Requests   Final    BOTTLES DRAWN AEROBIC ONLY Blood Culture results may not be optimal due to an inadequate volume of blood received in culture bottles   Culture   Final    NO GROWTH 2 DAYS Performed at [facility], [address], [place], [place] [address]    Report Status PENDING  Incomplete  Culture, blood (routine x 2)     Status: None (Preliminary result)   Collection Time: 01/06/18 10:00 AM  Result Value Ref Range Status   Specimen Description LEFT ANTECUBITAL  Final   Special Requests   Final    BOTTLES DRAWN AEROBIC ONLY Blood Culture results may not be optimal due to an inadequate volume of blood received in culture bottles   Culture   Final    NO GROWTH 2 DAYS Performed at [facility], [address], [place],  [address]    Report Status PENDING  Incomplete    Radiology Studies: No results found.  Scheduled Meds: . amiodarone  200 mg Oral BID  . aspirin EC  81 mg Oral BID  . atorvastatin  10 mg Oral QPM  . cholecalciferol  2,000 Units Oral Daily  . ciclopirox   Topical Daily  . enoxaparin (LOVENOX) injection  40 mg Subcutaneous Q24H  . feeding supplement (ENSURE ENLIVE)  237 mL Oral BID BM  . feeding supplement (PRO-STAT SUGAR FREE 64)  30 mL Oral BID  . insulin aspart  0-9 Units Subcutaneous TID WC  . iron polysaccharides  150 mg Oral Daily  . magnesium oxide  400 mg Oral Daily  . mometasone-formoterol  2 puff Inhalation BID  . multivitamin with minerals  1 tablet Oral q morning - 10a  . pantoprazole  40 mg Oral Daily  . polyvinyl alcohol  1 drop Both Eyes q  morning - 10a  . [  START ON 01/10/2018] predniSONE  10 mg Oral Q breakfast  . raloxifene  60 mg Oral Daily  . tamoxifen  20 mg Oral Daily   Continuous Infusions: . sodium chloride 10 mL/hr at 01/08/18 1325  . aztreonam    . [START ON 01/10/2018] vancomycin       LOS: 4 days    Irwin Brakeman, MD [facility] Pager [phone]  If 7PM-7AM, please contact night-coverage [email or web address] Password [email or web address] 01/09/2018, 12:02 PM

## 2018-01-09 NOTE — Progress Notes (Addendum)
Called by RN, patient again went into SVT.  She was switched from IV amiodarone to p.o. patient examined, she is currently asymptomatic.  Chart reviewed.  Today potassium was 3.3 which was replaced, will give another dose of K Dur 40 mg p.o. x1.  Magnesium was 1.3 on 01/06/2018 and she has been on magnesium oxide 400 mg p.o. daily.  Will recheck magnesium stat and replace as needed.  Patient was on both Cardizem and metoprolol, prior to admission.  Will give 1 dose of IV Lopressor 2.5 mg. If no response due to above measures will consider adenosine.  Critical care time spent 35 minutes

## 2018-01-09 NOTE — Progress Notes (Signed)
Initial Nutrition Assessment  DOCUMENTATION CODES:  Underweight  INTERVENTION:  Will order 30 mL Prostat BID, each supplement provides 100 kcal and 15 grams of protein.  Ensure Enlive po BID, each supplement provides 350 kcal and 20 grams of protein  NUTRITION DIAGNOSIS:  Increased nutrient needs related to wound healing, acute illness as evidenced by estimated nutritional requirements for this condition  GOAL:  Patient will meet greater than or equal to 90% of their needs   MONITOR:  PO intake, Supplement acceptance, Labs, Weight trends, I & O's  REASON FOR ASSESSMENT:  Consult Assessment of nutrition requirement/status  ASSESSMENT:  84 y/o female PMHx CAD, DM2, SVT, GERD, PUD, HTN. Brought to ED by family due to AMS, lethargy and fever. Worked up for sepsis 2/2 UTI and admitted for management.   RD operating remotely on weekends.  Per H&P, the family noted the patient had been eating regularly prior to the day of presentation. There is minimal meal intake documentation since patient admitted: Ate 75% x1 meal shortly after admit and ate 25% of one meal (PBJ sandwich) yesterday. Would assume patient has been eating poorly given her clinical state.   Noted patient with wounds to sacrum and thoracic spine. CM note states patient is bedbound.   Patient presented at weight of 102 lbs 12 oz. This is nearly identical to her most recent measured weight in May 2018. Her UBW for the past few years appears to be stable between 95-105 lbs.    Physical Exam: Unable to conduct  Will supplement diet for supplement while admitted.   Labs: A1C on 4/3 was 7.6. K: 5.3, mag: 2.5, BG:150-250, WBC:24.9, Albumin: 3.0 Meds: Vit D, Iron, Mag ox, MVI w/ min, PPI, Prednisone, IV abx, IVF  Recent Labs  Lab 01/06/18 0413 01/07/18 0525 01/08/18 0427 01/09/18 0216  NA 142 141 139 138  K 4.0 3.4* 3.3* 5.3*  CL 109 112* 113* 111  CO2 24 20* 20* 20*  BUN 24* 22* 19 18  CREATININE 1.39* 0.96 1.00  1.01*  CALCIUM 7.9* 6.9* 7.0* 7.8*  MG 1.3*  --   --  2.5*  GLUCOSE 146* 256* 198* 105*    NUTRITION - FOCUSED PHYSICAL EXAM: Unable to conduct  Diet Order:  DIET SOFT Room service appropriate? Yes; Fluid consistency: Thin  EDUCATION NEEDS:  No education needs have been identified at this time  Skin:  MSAD to perineum. Stage III PU to L buttocks. Stage III pressure ulcer to sacrum. Stage III PU to thoracic spine (per WOC note)  Last BM:  4/5  Height:  Ht Readings from Last 1 Encounters:  01/05/18 5\' 4"  (1.626 m)   Weight:  Wt Readings from Last 1 Encounters:  01/08/18 115 lb 11.9 oz (52.5 kg)   Wt Readings from Last 10 Encounters:  01/08/18 115 lb 11.9 oz (52.5 kg)  10/07/17 130 lb (59 kg)  03/05/17 102 lb 8.2 oz (46.5 kg)  02/11/17 106 lb (48.1 kg)  01/16/17 101 lb 10.1 oz (46.1 kg)  09/23/16 106 lb (48.1 kg)  07/22/16 107 lb 2.3 oz (48.6 kg)  04/11/16 106 lb (48.1 kg)  03/14/16 106 lb (48.1 kg)  03/09/16 112 lb 10.5 oz (51.1 kg)   Ideal Body Weight:  51.82 kg(Adjusted for bed bound status)  BMI:  Body mass index using dosing weight is 17.7 kg/m.- underweight  Dosing weight: 46.7 kg Estimated Nutritional Needs:  Kcal:  1600-1750 kcals (34-38 kcal/kg bw) Protein:  70-85g Pro (1.5-1.8 g/kg bw) Fluid:  Per MD goals  Burtis Junes RD, LDN, CNSC Clinical Nutrition Available Tues-Sat via Pager: [phone] 01/09/2018 9:47 AM

## 2018-01-09 NOTE — Progress Notes (Signed)
Patient went into SVT approximately 0115, patient asymptomatic and BP stable. MD made aware. Orders placed and followed. Patient has transitioned back into NSR following interventions. Will continue to monitor.

## 2018-01-10 LAB — CBC WITH DIFFERENTIAL/PLATELET
BASOS ABS: 0 10*3/uL (ref 0.0–0.1)
BASOS PCT: 0 %
Eosinophils Absolute: 0.1 10*3/uL (ref 0.0–0.7)
Eosinophils Relative: 1 %
HEMATOCRIT: 29.5 % — AB (ref 36.0–46.0)
HEMOGLOBIN: 9.1 g/dL — AB (ref 12.0–15.0)
LYMPHS PCT: 10 %
Lymphs Abs: 1.7 10*3/uL (ref 0.7–4.0)
MCH: 28.2 pg (ref 26.0–34.0)
MCHC: 30.8 g/dL (ref 30.0–36.0)
MCV: 91.3 fL (ref 78.0–100.0)
MONO ABS: 0.6 10*3/uL (ref 0.1–1.0)
MONOS PCT: 4 %
NEUTROS ABS: 14.4 10*3/uL — AB (ref 1.7–7.7)
NEUTROS PCT: 85 %
Platelets: 223 10*3/uL (ref 150–400)
RBC: 3.23 MIL/uL — ABNORMAL LOW (ref 3.87–5.11)
RDW: 14.1 % (ref 11.5–15.5)
WBC: 16.8 10*3/uL — ABNORMAL HIGH (ref 4.0–10.5)

## 2018-01-10 LAB — GLUCOSE, CAPILLARY
GLUCOSE-CAPILLARY: 243 mg/dL — AB (ref 65–99)
GLUCOSE-CAPILLARY: 252 mg/dL — AB (ref 65–99)
GLUCOSE-CAPILLARY: 272 mg/dL — AB (ref 65–99)
Glucose-Capillary: 136 mg/dL — ABNORMAL HIGH (ref 65–99)

## 2018-01-10 LAB — CULTURE, BLOOD (ROUTINE X 2): Culture: NO GROWTH

## 2018-01-10 LAB — BASIC METABOLIC PANEL
ANION GAP: 5 (ref 5–15)
BUN: 19 mg/dL (ref 6–20)
CALCIUM: 8.1 mg/dL — AB (ref 8.9–10.3)
CHLORIDE: 109 mmol/L (ref 101–111)
CO2: 24 mmol/L (ref 22–32)
Creatinine, Ser: 0.86 mg/dL (ref 0.44–1.00)
GFR calc non Af Amer: 58 mL/min — ABNORMAL LOW (ref >60)
Glucose, Bld: 173 mg/dL — ABNORMAL HIGH (ref 65–99)
Potassium: 4.9 mmol/L (ref 3.5–5.1)
Sodium: 138 mmol/L (ref 135–145)

## 2018-01-10 LAB — MAGNESIUM: Magnesium: 2.1 mg/dL (ref 1.7–2.4)

## 2018-01-10 MED ORDER — INSULIN GLARGINE 100 UNIT/ML ~~LOC~~ SOLN
2.0000 [IU] | SUBCUTANEOUS | Status: DC
  Administered 2018-01-11: 2 [IU] via SUBCUTANEOUS
  Filled 2018-01-10 (×2): qty 0.02

## 2018-01-10 MED ORDER — INSULIN GLARGINE 100 UNIT/ML ~~LOC~~ SOLN
4.0000 [IU] | SUBCUTANEOUS | Status: DC
  Filled 2018-01-10 (×2): qty 0.04

## 2018-01-10 NOTE — Progress Notes (Signed)
PROGRESS NOTE   [name]  XBM:[id number] DOB: [birth year] DOA: 01/05/2018 PCP: Moshe Cipro, MD    Brief Narrative:  84 year old female with multiple medical problems, who lives at home with her family, brought to the hospital with fever and increasing lethargy.  Found to be dehydrated and have evidence of sepsis.  Source of infection felt to be possible urinary tract infection.  On IV antibiotics and IV fluids.  Assessment & Plan:   Principal Problem:   Sepsis secondary to UTI [facility]) Active Problems:   Rheumatoid arthritis ([place])   HTN (hypertension)   Diabetes type 2, controlled ([place])   GERD (gastroesophageal reflux disease)   Asthma, chronic   Lactic acidosis   Hypernatremia   Decubitus ulcer of thoracic spine area, stage III ([facility])   Decubitus ulcer of sacral region, stage 2   Anemia   SVT (supraventricular tachycardia) ([place])   1. Sepsis, likely secondary to urinary tract infection.  Started on intravenous antibiotics with levofloxacin and now with rising WBC have broadened coverage pending culture results.  Blood cultures and urine culture in process.  Overall hypotension is improving.   2. Lactic acidosis.  Resolved now.  Related to volume depletion.  Improved with IV fluids. 3. Hypertension.  Antihypertensives initially held due to hypotension.  She is chronically on Cardizem and metoprolol. Metoprolol has been restarted.  4. SVT. She has another recurrence 4/6.   Patient developed persistent SVT on 4/3 that terminated with adenosine, but then recurred. Due to low blood pressures, she was started on amiodarone infusion and has since converted to sinus hythm. Heart rates have been stable. She has been transitioned to po amiodarone 200mg  bid for 3 weeks, after which she can be transitioned back to cardizem/metoprolol.  She will need cardiology follow-up after discharge.  Because she had recurrence today, will restart low dose metoprolol 12.5 mg BID and monitor BPs to make sure  she tolerates it.  Her BPs have improved markedly.  5. Diabetes type 2 -  A1c 7.6. Blood sugars are elevated due to steroids.  Anticipate blood sugar should improve as steroids are tapered.  Continue sliding scale insulin. Weaning steroids.  6. GERD.  Continue on Protonix 7. Stage II and III decubitus ulcer on sacral region and thoracic spine respectively.  Do not appear to be actively infected.  Wound care consult appreciated. 8. Hyperlipidemia.  Continue statin therapy.  9. Rheumatoid arthritis.  No evidence of joint flares at this time.  Continue on steroids. 10. Anemia, likely related to chronic disease.  No reported history of bleeding.  Baseline hemoglobin appears to run between 9-10.  May also have a component of hemodilution.  Hemoglobin appears to be stable continue to monitor. 11. Asthma.  No shortness of dosis.  Resolved now.  Related to sepsis.  Trended down with IV fluids. Continue bronchodilators as needed. 12. Hyperkalemia - resolved now.   DVT prophylaxis: lovenox Code Status: full code Family Communication: discussed with daughters at the bedside Disposition Plan: discharge home once infection has improved and hemodynamics have stabilized.  Subjective: Patient says she is feeling better this morning.    Objective: Vitals:   01/09/18 2016 01/09/18 2025 01/10/18 0611 01/10/18 0743  BP: 120/72  124/79   Pulse: 96  88   Resp:      Temp: 98.4 F (36.9 C)  98.3 F (36.8 C)   TempSrc: Oral  Oral   SpO2: 98% 94% 99% 97%  Weight:      Height:  Intake/Output Summary (Last 24 hours) at 01/10/2018 1103 Last data filed at 01/10/2018 0630 Gross per 24 hour  Intake 440 ml  Output 1000 ml  Net -560 ml   Filed Weights   01/06/18 0500 01/07/18 0500 01/08/18 0400  Weight: 46.6 kg (102 lb 11.8 oz) 50.7 kg (111 lb 12.4 oz) 52.5 kg (115 lb 11.9 oz)    Examination:  General exam: Alert, awake, oriented x 3, NAD. Cooperative.  Respiratory system: Clear to auscultation.  Respiratory effort normal. Cardiovascular system: normal s1,s2 sounds.  No murmurs, rubs, gallops. Gastrointestinal system: Abdomen is nondistended, soft and nontender. No organomegaly or masses felt. Normal bowel sounds heard. Central nervous system: Limited exam due to baseline deficits.  Right upper extremity is contracted. Extremities: Right upper extremity is contracted and edematous and arthritic changes noted of both hands.  Skin: No rashes, lesions or ulcers Psychiatry: flat affect. pleasant.    Data Reviewed: I have personally reviewed following labs and imaging studies  CBC: Recent Labs  Lab 01/05/18 1643 01/06/18 0413 01/07/18 0525 01/08/18 0427 01/09/18 0216 01/10/18 0652  WBC 21.8* 23.9* 22.1* 20.9* 24.9* 16.8*  NEUTROABS 19.1* 20.0* 20.5* 18.7*  --  14.4*  HGB 12.0 10.5* 8.5* 8.5* 9.3* 9.1*  HCT 39.8 35.1* 27.8* 26.8* 29.6* 29.5*  MCV 96.1 95.1 92.7 91.2 90.2 91.3  PLT 281 213 191 204 217 694   Basic Metabolic Panel: Recent Labs  Lab 01/06/18 0413 01/07/18 0525 01/08/18 0427 01/09/18 0216 01/10/18 0652  NA 142 141 139 138 138  K 4.0 3.4* 3.3* 5.3* 4.9  CL 109 112* 113* 111 109  CO2 24 20* 20* 20* 24  GLUCOSE 146* 256* 198* 105* 173*  BUN 24* 22* 19 18 19   CREATININE 1.39* 0.96 1.00 1.01* 0.86  CALCIUM 7.9* 6.9* 7.0* 7.8* 8.1*  MG 1.3*  --   --  2.5* 2.1   GFR: Estimated Creatinine Clearance: 36.8 mL/min (by C-G formula based on SCr of 0.86 mg/dL). Liver Function Tests: Recent Labs  Lab 01/05/18 1643  AST 23  ALT 11*  ALKPHOS 63  BILITOT 0.6  PROT 7.0  ALBUMIN 3.0*   No results for input(s): LIPASE, AMYLASE in the last 168 hours. No results for input(s): AMMONIA in the last 168 hours. Coagulation Profile: Recent Labs  Lab 01/05/18 1637  INR 0.97   Cardiac Enzymes: No results for input(s): CKTOTAL, CKMB, CKMBINDEX, TROPONINI in the last 168 hours. BNP (last 3 results) No results for input(s): PROBNP in the last 8760 hours. HbA1C: No  results for input(s): HGBA1C in the last 72 hours. CBG: Recent Labs  Lab 01/09/18 0834 01/09/18 1135 01/09/18 1658 01/09/18 2138 01/10/18 0722  GLUCAP 104* 136* 241* 305* 136*   Lipid Profile: No results for input(s): CHOL, HDL, LDLCALC, TRIG, CHOLHDL, LDLDIRECT in the last 72 hours. Thyroid Function Tests: No results for input(s): TSH, T4TOTAL, FREET4, T3FREE, THYROIDAB in the last 72 hours. Anemia Panel: No results for input(s): VITAMINB12, FOLATE, FERRITIN, TIBC, IRON, RETICCTPCT in the last 72 hours. Sepsis Labs: Recent Labs  Lab 01/05/18 1711 01/05/18 2012 01/05/18 2349 01/06/18 0413  LATICACIDVEN 8.8* 1.8 3.3* 2.5*    Recent Results (from the past 240 hour(s))  Urine culture     Status: Abnormal   Collection Time: 01/05/18  4:10 PM  Result Value Ref Range Status   Specimen Description   Final    URINE, CATHETERIZED Performed at [facility], [address], [place], [place] [address]    Special Requests  Final    NONE Performed at [facility], [address] [place], [place] [address]    Culture 40,000 COLONIES/mL YEAST (A)  Final   Report Status 01/07/2018 FINAL  Final  Blood Culture (routine x 2)     Status: None   Collection Time: 01/05/18  4:44 PM  Result Value Ref Range Status   Specimen Description BLOOD RIGHT ARM  Final   Special Requests   Final    BOTTLES DRAWN AEROBIC ONLY Blood Culture results may not be optimal due to an inadequate volume of blood received in culture bottles   Culture   Final    NO GROWTH 5 DAYS Performed at [facility], [address] [place], [place] [address]    Report Status 01/10/2018 FINAL  Final  MRSA PCR Screening     Status: None   Collection Time: 01/05/18  8:44 PM  Result Value Ref Range Status   MRSA by PCR NEGATIVE NEGATIVE Final    Comment:        The GeneXpert MRSA Assay (FDA approved for NASAL specimens only), is one component of a comprehensive MRSA colonization surveillance program. It is not intended  to diagnose MRSA infection nor to guide or monitor treatment for MRSA infections. Performed at [facility], [address] [place], [place] [address]   Culture, blood (routine x 2)     Status: None (Preliminary result)   Collection Time: 01/06/18  9:55 AM  Result Value Ref Range Status   Specimen Description BLOOD LEFT HAND  Final   Special Requests   Final    BOTTLES DRAWN AEROBIC ONLY Blood Culture results may not be optimal due to an inadequate volume of blood received in culture bottles   Culture   Final    NO GROWTH 4 DAYS Performed at [facility], [address] [place], [place] [address]    Report Status PENDING  Incomplete  Culture, blood (routine x 2)     Status: None (Preliminary result)   Collection Time: 01/06/18 10:00 AM  Result Value Ref Range Status   Specimen Description LEFT ANTECUBITAL  Final   Special Requests   Final    BOTTLES DRAWN AEROBIC ONLY Blood Culture results may not be optimal due to an inadequate volume of blood received in culture bottles   Culture   Final    NO GROWTH 4 DAYS Performed at [facility], [address] [place], [place] [address]    Report Status PENDING  Incomplete    Radiology Studies: No results found.  Scheduled Meds: . amiodarone  200 mg Oral BID  . aspirin EC  81 mg Oral BID  . atorvastatin  10 mg Oral QPM  . cholecalciferol  2,000 Units Oral Daily  . ciclopirox   Topical Daily  . enoxaparin (LOVENOX) injection  40 mg Subcutaneous Q24H  . feeding supplement (ENSURE ENLIVE)  237 mL Oral BID BM  . feeding supplement (PRO-STAT SUGAR FREE 64)  30 mL Oral BID  . fluconazole  100 mg Oral Daily  . insulin aspart  0-9 Units Subcutaneous TID WC  . [START ON 01/11/2018] insulin glargine  2 Units Subcutaneous BH-q7a  . iron polysaccharides  150 mg Oral Daily  . magnesium oxide  400 mg Oral Daily  . metoprolol tartrate  12.5 mg Oral BID  . mometasone-formoterol  2 puff Inhalation BID  . multivitamin with minerals  1 tablet Oral q  morning - 10a  . pantoprazole  40 mg Oral Daily  .  polyvinyl alcohol  1 drop Both Eyes q morning - 10a  . predniSONE  10 mg Oral Q breakfast  . raloxifene  60 mg Oral Daily  . tamoxifen  20 mg Oral Daily   Continuous Infusions: . sodium chloride 10 mL/hr at 01/08/18 1325  . aztreonam Stopped (01/10/18 0730)  . vancomycin 750 mg (01/10/18 0948)     LOS: 5 days   Irwin Brakeman, MD [facility] Pager [phone]  If 7PM-7AM, please contact night-coverage [email or web address] Password [email or web address] 01/10/2018, 11:03 AM

## 2018-01-11 LAB — CBC WITH DIFFERENTIAL/PLATELET
BASOS ABS: 0 10*3/uL (ref 0.0–0.1)
Basophils Relative: 0 %
EOS PCT: 1 %
Eosinophils Absolute: 0.1 10*3/uL (ref 0.0–0.7)
HCT: 29.5 % — ABNORMAL LOW (ref 36.0–46.0)
Hemoglobin: 9.1 g/dL — ABNORMAL LOW (ref 12.0–15.0)
LYMPHS PCT: 12 %
Lymphs Abs: 1.7 10*3/uL (ref 0.7–4.0)
MCH: 28.2 pg (ref 26.0–34.0)
MCHC: 30.8 g/dL (ref 30.0–36.0)
MCV: 91.3 fL (ref 78.0–100.0)
MONO ABS: 0.7 10*3/uL (ref 0.1–1.0)
Monocytes Relative: 5 %
Neutro Abs: 11.7 10*3/uL (ref 1.7–7.7)
Neutrophils Relative %: 82 %
PLATELETS: 232 10*3/uL (ref 150–400)
RBC: 3.23 MIL/uL — ABNORMAL LOW (ref 3.87–5.11)
RDW: 14 % (ref 11.5–15.5)
WBC: 14.2 10*3/uL — ABNORMAL HIGH (ref 4.0–10.5)

## 2018-01-11 LAB — CULTURE, BLOOD (ROUTINE X 2)
Culture: NO GROWTH
Culture: NO GROWTH

## 2018-01-11 LAB — GLUCOSE, CAPILLARY
Glucose-Capillary: 142 mg/dL — ABNORMAL HIGH (ref 65–99)
Glucose-Capillary: 299 mg/dL — ABNORMAL HIGH (ref 65–99)

## 2018-01-11 MED ORDER — METOPROLOL SUCCINATE ER 25 MG PO TB24: 12.5000 mg | ORAL_TABLET | Freq: Every day | ORAL | 6 refills | Status: AC

## 2018-01-11 MED ORDER — AMIODARONE HCL 200 MG PO TABS: 200.0000 mg | ORAL_TABLET | Freq: Two times a day (BID) | ORAL | 0 refills | Status: DC

## 2018-01-11 MED ORDER — FLUCONAZOLE 100 MG PO TABS: 100.0000 mg | ORAL_TABLET | Freq: Every day | ORAL | 0 refills | Status: AC

## 2018-01-11 NOTE — Care Management Important Message (Signed)
Important Message  Patient Details  Name: [name] MRN: [id number] Date of Birth: 10-13-1925   Medicare Important Message Given:  Yes    Shelda Altes 01/11/2018, 12:53 PM

## 2018-01-11 NOTE — Discharge Summary (Signed)
Physician Discharge Summary  [name] IEP:[id number] DOB: 11-Jan-1926 DOA: 01/05/2018  PCP: Moshe Cipro, MD  Admit date: 01/05/2018 Discharge date: 01/11/2018  Admitted From: Home  Disposition: Home   Recommendations for Outpatient Follow-up:  1. Follow up with PCP in 1 weeks 2. Please discontinue amiodarone after 3 weeks total (01/29/28) and can resume diltiazem and metoprolol after that per cardiology 3. Please arrange outpatient cardiology appointment for patient regarding episodes of SVT.   4. Please consider UTI antibiotic prophylaxis for prevention of recurrent UTIs. 5. Please recheck urinalysis in 1-2 weeks for test of cure.   6. Please obtain BMP/CBC in one week  Discharge Condition: STABLE   CODE STATUS: FULL    Brief Hospitalization Summary: Please see all hospital notes, images, labs for full details of the hospitalization. HPI: [name] is a 84 y.o. female with medical history significant of  anemia, osteoarthritis, asthma, kyphosis, home oxygen, history of breast cancer, history of CAD, type 2 diabetes, GERD, PUD, diverticulosis, history of diverticulitis, osteoporosis, history of thrombocytopenia, history of unspecified mitral valve disorder who is brought to the emergency department due to fever, weakness and decreased mentation since early afternoon.  She was last known to be well this morning. The patient is unable to provide further history at this time, but per her daughters, [name] and [name], symptoms are similar to when the patient gets an episode of UTI.   There had not been any complaints of pain.  No emesis or diarrhea. Prior to this, she has been eating and sleeping regularly until this morning.   ED Course: Initial vital signs temperature 29 C (100 and 0.2 F), pulse 116, respirations 20, blood pressure 124/87 mmHg and O2 sat 96% on room air.  The patient received 1500 mL's of normal saline bolus, aztreonam 2 g and Levaquin 750 mg IVPB in the ED.  Her  urinalysis shows glucosuria more than 500 mg/dL, moderate hemoglobinuria, 100 mg of proteinuria and large leukocyte esterase.  Microscopic exam shows 6-30 RBC, TNTC WBC and rare bacteria.  There were positive budding G yeast and white blood cell clumps.   Urine and blood cultures x2 were sent to the lab.  Her white count was 21.8 with 87% neutrophils, 8% lymphocytes and 5% monocytes.  Hemoglobin 12.0 g/dL and platelets 281.  Her H&H was 10.6 on 08/14/2017 and before that average around 9-9.5 g/dL for the previous year.  PT 12.8 seconds and INR 0.97.  Sodium 148, potassium 5.2, chloride 106, CO2 26 and lactic acid 8.8 mmol/L.  Anion gap was 16.  Glucose was 74, BUN 21, creatinine 1.23 and calcium 9.5 mg/dL.  Her LFTs show an albumin of 3.0 g/dL, but all other values were unremarkable.  A venous blood gas shows a pH of 7.21, PCO2 of 63.2 and PO2 of 34.7 mmHg.  Bicarbonate was 20.4 and acid base 2.3 millimolar/L.  An arterial blood gas was subsequently drawn which showed a pH of 7.44, PCO2 of 38.3 and PO2 of 68.4 mmHg, bicarbonate 26.3 and acid base excess 2.1 millimolar/L.  Her O2 sat was 93%.  The venous gas was drawn before normal saline bolus, ABG was drawn after the NS bolus had been given.  Brief Narrative:  84 year old female with multiple medical problems, who lives at home with her family, brought to the hospital with fever and increasing lethargy.  Found to be dehydrated and have evidence of sepsis.  Source of infection felt to be possible urinary tract infection.  On IV antibiotics and  IV fluids.  Assessment & Plan:   Principal Problem:   Sepsis secondary to UTI [facility]) Active Problems:   Rheumatoid arthritis ([place])   HTN (hypertension)   Diabetes type 2, controlled ([place])   GERD (gastroesophageal reflux disease)   Asthma, chronic   Lactic acidosis   Hypernatremia   Decubitus ulcer of thoracic spine area, stage III (HCC)   Decubitus ulcer of sacral region, stage 2   Anemia   SVT  (supraventricular tachycardia) ([place])  1. Sepsis, likely secondary to urinary tract infection.  Started on intravenous antibiotics with levofloxacin and now with rising WBC have broadened coverage pending culture results.  Blood cultures and urine culture in process.  Overall hypotension is improving.   2. Lactic acidosis.  Resolved now.  Related to volume depletion.  Improved with IV fluids. 3. Hypertension.  Antihypertensives initially held due to hypotension.  She is chronically on Cardizem and metoprolol. Metoprolol has been restarted.  4. UTI - cultures have been positive for yeast infection and has been treated with fluconazole.  Follow up with PCP to have test of cure done.  Pt to discuss with PCP about UTI antibiotic prophylaxis to avoid recurrent infections.   5. SVT. She has another recurrence 4/6.   Patient developed persistent SVT on 4/3 that terminated with adenosine, but then recurred. Due to low blood pressures, she was started on amiodarone infusion and has since converted to sinus hythm. Heart rates have been stable. She has been transitioned to po amiodarone 200mg  bid for 3 weeks, after which she can be transitioned back to cardizem/metoprolol.  She will need cardiology follow-up after discharge.  Because she had recurrence, we restart low dose metoprolol 12.5 mg BID and she tolerated it and had no recurrence.  Her BPs have improved markedly.  6. Diabetes type 2 -  A1c 7.6. Blood sugars are elevated due to steroids.  Anticipate blood sugar should improve as steroids are tapered.  Continue sliding scale insulin. Weaning steroids back to her home dose of 5 mg daily.  7. GERD.  Continue on Protonix 8. Stage II and III decubitus ulcer on sacral region and thoracic spine respectively.  Do not appear to be actively infected.  Wound care consult appreciated. 9. Hyperlipidemia.  Continue statin therapy.  10. Rheumatoid arthritis.  No evidence of joint flares at this time.  Continue on  steroids. 11. Anemia, likely related to chronic disease.  No reported history of bleeding.  Baseline hemoglobin appears to run between 9-10.  May also have a component of hemodilution.  Hemoglobin appears to be stable continue to monitor. 12. Asthma.  No shortness of dosis.  Resolved now.  Related to sepsis.  Trended down with IV fluids. Continue bronchodilators as needed. 13. Hyperkalemia - resolved now.   DVT prophylaxis: lovenox Code Status: full code Family Communication: discussed with daughters at the bedside Disposition Plan: discharge home.   Discharge Diagnoses:  Principal Problem:   Sepsis secondary to UTI [facility]) Active Problems:   Rheumatoid arthritis ([place])   HTN (hypertension)   Diabetes type 2, controlled (HCC)   GERD (gastroesophageal reflux disease)   Asthma, chronic   Lactic acidosis   Hypernatremia   Decubitus ulcer of thoracic spine area, stage III (HCC)   Decubitus ulcer of sacral region, stage 2   Anemia   SVT (supraventricular tachycardia) (HCC)  Discharge Instructions: Discharge Instructions    Call MD for:  difficulty breathing, headache or visual disturbances   Complete by:  As directed  Call MD for:  extreme fatigue   Complete by:  As directed    Call MD for:  persistant dizziness or light-headedness   Complete by:  As directed    Call MD for:  persistant nausea and vomiting   Complete by:  As directed    Call MD for:  redness, tenderness, or signs of infection (pain, swelling, redness, odor or green/yellow discharge around incision site)   Complete by:  As directed    Increase activity slowly   Complete by:  As directed      Allergies as of 01/11/2018      Reactions   Aspirin Other (See Comments)   Cannot take uncoated due to stomach ulcers   Megace [megestrol]    abd pain   Rocephin [ceftriaxone Sodium In Dextrose] Swelling      Medication List    STOP taking these medications   diltiazem 120 MG 24 hr capsule Commonly known as:   CARDIZEM CD     TAKE these medications   amiodarone 200 MG tablet Commonly known as:  PACERONE Take 1 tablet (200 mg total) by mouth 2 (two) times daily for 17 days.   aspirin EC 81 MG tablet Take 81 mg by mouth 2 (two) times daily.   atorvastatin 10 MG tablet Commonly known as:  LIPITOR Take 10 mg by mouth every evening.   budesonide-formoterol 160-4.5 MCG/ACT inhaler Commonly known as:  SYMBICORT Inhale 2 puffs into the lungs 2 (two) times daily.   CENTRUM SILVER ADULT 50+ PO Take 1 tablet by mouth every morning.   ciclopirox 8 % solution Commonly known as:  PENLAC APPLY TO FUNGAL TOENAILS EVERY DAY   CLEAR EYES ALL SEASONS 5-6 MG/ML Soln Generic drug:  Polyvinyl Alcohol-Povidone Apply 1 drop to eye every morning.   D-MANNOSE PO Take 1,000 mg by mouth daily.   fluconazole 100 MG tablet Commonly known as:  DIFLUCAN Take 1 tablet (100 mg total) by mouth daily for 5 days. Start taking on:  01/12/2018   guaiFENesin 600 MG 12 hr tablet Commonly known as:  MUCINEX Take 1 tablet (600 mg total) by mouth 2 (two) times daily. What changed:    when to take this  reasons to take this   insulin aspart 100 UNIT/ML FlexPen Commonly known as:  NOVOLOG Inject 4 Units into the skin 3 (three) times daily with meals. What changed:    how much to take  additional instructions   Insulin Glargine 100 UNIT/ML Solostar Pen Commonly known as:  LANTUS Inject 5 Units into the skin daily. What changed:  when to take this   ipratropium-albuterol 0.5-2.5 (3) MG/3ML Soln Commonly known as:  DUONEB Take 3 mLs by nebulization 2 (two) times daily.   lactobacillus acidophilus & bulgar chewable tablet CHEW 1 TABLET BY MOUTH EVERY DAY   magnesium oxide 400 (241.3 Mg) MG tablet Commonly known as:  MAG-OX Take 0.5 tablets (200 mg total) by mouth daily. What changed:    how much to take  additional instructions   metFORMIN 500 MG tablet Commonly known as:  GLUCOPHAGE Take 1,000  mg by mouth 2 (two) times daily with a meal.   metoprolol succinate 25 MG 24 hr tablet Commonly known as:  TOPROL-XL Take 0.5 tablets (12.5 mg total) by mouth daily. What changed:    how much to take  additional instructions   pantoprazole 40 MG tablet Commonly known as:  PROTONIX Take 40 mg by mouth daily.   Pen Needles 5/16"  30G X 8 MM Misc Use as directed, before meals and as needed   POLY-IRON 150 FORTE 150-0.025-1 MG Caps Generic drug:  Iron Polysacch Cmplx-B12-FA TAKE ONE CAPSULE BY MOUTH DAILY   predniSONE 5 MG tablet Commonly known as:  DELTASONE Take 2 tablets (10 mg total) by mouth daily with breakfast. What changed:  how much to take   raloxifene 60 MG tablet Commonly known as:  EVISTA Take 60 mg by mouth daily.   tamoxifen 20 MG tablet Commonly known as:  NOLVADEX Take 1 tablet (20 mg total) daily by mouth.   Vitamin D3 1000 units Caps Take 2,000 Units by mouth daily.      Follow-up Information    Moshe Cipro, MD Follow up.   Specialty:  Internal Medicine Contact information: [address] [place] [address] [phone]          Allergies  Allergen Reactions  . Aspirin Other (See Comments)    Cannot take uncoated due to stomach ulcers  . Megace [Megestrol]     abd pain   . Rocephin [Ceftriaxone Sodium In Dextrose] Swelling   Allergies as of 01/11/2018      Reactions   Aspirin Other (See Comments)   Cannot take uncoated due to stomach ulcers   Megace [megestrol]    abd pain   Rocephin [ceftriaxone Sodium In Dextrose] Swelling      Medication List    STOP taking these medications   diltiazem 120 MG 24 hr capsule Commonly known as:  CARDIZEM CD     TAKE these medications   amiodarone 200 MG tablet Commonly known as:  PACERONE Take 1 tablet (200 mg total) by mouth 2 (two) times daily for 17 days.   aspirin EC 81 MG tablet Take 81 mg by mouth 2 (two) times daily.   atorvastatin 10 MG tablet Commonly known as:   LIPITOR Take 10 mg by mouth every evening.   budesonide-formoterol 160-4.5 MCG/ACT inhaler Commonly known as:  SYMBICORT Inhale 2 puffs into the lungs 2 (two) times daily.   CENTRUM SILVER ADULT 50+ PO Take 1 tablet by mouth every morning.   ciclopirox 8 % solution Commonly known as:  PENLAC APPLY TO FUNGAL TOENAILS EVERY DAY   CLEAR EYES ALL SEASONS 5-6 MG/ML Soln Generic drug:  Polyvinyl Alcohol-Povidone Apply 1 drop to eye every morning.   D-MANNOSE PO Take 1,000 mg by mouth daily.   fluconazole 100 MG tablet Commonly known as:  DIFLUCAN Take 1 tablet (100 mg total) by mouth daily for 5 days. Start taking on:  01/12/2018   guaiFENesin 600 MG 12 hr tablet Commonly known as:  MUCINEX Take 1 tablet (600 mg total) by mouth 2 (two) times daily. What changed:    when to take this  reasons to take this   insulin aspart 100 UNIT/ML FlexPen Commonly known as:  NOVOLOG Inject 4 Units into the skin 3 (three) times daily with meals. What changed:    how much to take  additional instructions   Insulin Glargine 100 UNIT/ML Solostar Pen Commonly known as:  LANTUS Inject 5 Units into the skin daily. What changed:  when to take this   ipratropium-albuterol 0.5-2.5 (3) MG/3ML Soln Commonly known as:  DUONEB Take 3 mLs by nebulization 2 (two) times daily.   lactobacillus acidophilus & bulgar chewable tablet CHEW 1 TABLET BY MOUTH EVERY DAY   magnesium oxide 400 (241.3 Mg) MG tablet Commonly known as:  MAG-OX Take 0.5 tablets (200 mg  total) by mouth daily. What changed:    how much to take  additional instructions   metFORMIN 500 MG tablet Commonly known as:  GLUCOPHAGE Take 1,000 mg by mouth 2 (two) times daily with a meal.   metoprolol succinate 25 MG 24 hr tablet Commonly known as:  TOPROL-XL Take 0.5 tablets (12.5 mg total) by mouth daily. What changed:    how much to take  additional instructions   pantoprazole 40 MG tablet Commonly known as:   PROTONIX Take 40 mg by mouth daily.   Pen Needles 5/16" 30G X 8 MM Misc Use as directed, before meals and as needed   POLY-IRON 150 FORTE 150-0.025-1 MG Caps Generic drug:  Iron Polysacch Cmplx-B12-FA TAKE ONE CAPSULE BY MOUTH DAILY   predniSONE 5 MG tablet Commonly known as:  DELTASONE Take 2 tablets (10 mg total) by mouth daily with breakfast. What changed:  how much to take   raloxifene 60 MG tablet Commonly known as:  EVISTA Take 60 mg by mouth daily.   tamoxifen 20 MG tablet Commonly known as:  NOLVADEX Take 1 tablet (20 mg total) daily by mouth.   Vitamin D3 1000 units Caps Take 2,000 Units by mouth daily.       Procedures/Studies: Ct Head Wo Contrast  Result Date: 01/05/2018 CLINICAL DATA:  Altered mental status. EXAM: CT HEAD WITHOUT CONTRAST TECHNIQUE: Contiguous axial images were obtained from the base of the skull through the vertex without intravenous contrast. COMPARISON:  None. FINDINGS: Brain: No evidence of acute infarction, hemorrhage, hydrocephalus, extra-axial collection or mass lesion/mass effect. Mild age related cerebral atrophy. Periventricular and subcortical white matter hypodensities are nonspecific but favored to reflect chronic microvascular ischemic changes. Vascular: Atherosclerotic vascular calcification of the carotid siphons. No hyperdense vessel. Skull: Negative for fracture or focal lesion. Sinuses/Orbits: No acute finding. Other: Partially visualized atrophy of the proximal cervical spinal cord. IMPRESSION: 1. Normal for age noncontrast head CT. 2. Partially visualized atrophy of the proximal cervical spinal cord. Electronically Signed   By: Titus Dubin M.D.   On: 01/05/2018 17:24   Dg Chest Port 1 View  Result Date: 01/05/2018 CLINICAL DATA:  Fever with altered mental status EXAM: PORTABLE CHEST 1 VIEW COMPARISON:  Mar 04, 2017 FINDINGS: There is stable mild elevation of the left hemidiaphragm. There is no edema or consolidation. The heart  size and pulmonary vascularity are normal. No adenopathy. There is aortic atherosclerosis. There are surgical clips in the left lateral breast/axillary region. IMPRESSION: No edema or consolidation. Heart size normal. There is aortic atherosclerosis. Aortic Atherosclerosis (ICD10-I70.0). Electronically Signed   By: Lowella Grip III M.D.   On: 01/05/2018 16:27      Subjective: Pt reports that she is feeling a lot better.    Discharge Exam: Vitals:   01/11/18 0500 01/11/18 0834  BP: (!) 129/59   Pulse: 80   Resp: 17   Temp: (!) 97.5 F (36.4 C)   SpO2: 100% 100%   Vitals:   01/10/18 2036 01/10/18 2202 01/11/18 0500 01/11/18 0834  BP:  (!) 109/57 (!) 129/59   Pulse:  86 80   Resp:  16 17   Temp:  98 F (36.7 C) (!) 97.5 F (36.4 C)   TempSrc:  Oral Oral   SpO2: 95% 98% 100% 100%  Weight:      Height:        General: Pt is alert, awake, not in acute distress Cardiovascular: RRR, S1/S2 +, no rubs, no gallops Respiratory: CTA  bilaterally, no wheezing, no rhonchi Abdominal: Soft, NT, ND, bowel sounds + Extremities: no edema, no cyanosis   The results of significant diagnostics from this hospitalization (including imaging, microbiology, ancillary and laboratory) are listed below for reference.     Microbiology: Recent Results (from the past 240 hour(s))  Urine culture     Status: Abnormal   Collection Time: 01/05/18  4:10 PM  Result Value Ref Range Status   Specimen Description   Final    URINE, CATHETERIZED Performed at [facility], [address], [place], [place] [address]    Special Requests   Final    NONE Performed at [facility], [address], [place], [place] [address]    Culture 40,000 COLONIES/mL YEAST (A)  Final   Report Status 01/07/2018 FINAL  Final  Blood Culture (routine x 2)     Status: None   Collection Time: 01/05/18  4:44 PM  Result Value Ref Range Status   Specimen Description BLOOD RIGHT ARM  Final   Special Requests   Final    BOTTLES  DRAWN AEROBIC ONLY Blood Culture results may not be optimal due to an inadequate volume of blood received in culture bottles   Culture   Final    NO GROWTH 5 DAYS Performed at [facility], [address], [place], [place] [address]    Report Status 01/10/2018 FINAL  Final  MRSA PCR Screening     Status: None   Collection Time: 01/05/18  8:44 PM  Result Value Ref Range Status   MRSA by PCR NEGATIVE NEGATIVE Final    Comment:        The GeneXpert MRSA Assay (FDA approved for NASAL specimens only), is one component of a comprehensive MRSA colonization surveillance program. It is not intended to diagnose MRSA infection nor to guide or monitor treatment for MRSA infections. Performed at [facility], [address], [place], [place] [address]   Culture, blood (routine x 2)     Status: None   Collection Time: 01/06/18  9:55 AM  Result Value Ref Range Status   Specimen Description BLOOD LEFT HAND  Final   Special Requests   Final    BOTTLES DRAWN AEROBIC ONLY Blood Culture results may not be optimal due to an inadequate volume of blood received in culture bottles   Culture   Final    NO GROWTH 5 DAYS Performed at [facility], [address], [place], [place] [address]    Report Status 01/11/2018 FINAL  Final  Culture, blood (routine x 2)     Status: None   Collection Time: 01/06/18 10:00 AM  Result Value Ref Range Status   Specimen Description LEFT ANTECUBITAL  Final   Special Requests   Final    BOTTLES DRAWN AEROBIC ONLY Blood Culture results may not be optimal due to an inadequate volume of blood received in culture bottles   Culture   Final    NO GROWTH 5 DAYS Performed at [facility], [address] [place], [place] [address]    Report Status 01/11/2018 FINAL  Final     Labs: BNP (last 3 results) No results for input(s): BNP in the last 8760 hours. Basic Metabolic Panel: Recent Labs  Lab 01/06/18 0413 01/07/18 0525 01/08/18 0427 01/09/18 0216 01/10/18 0652  NA  142 141 139 138 138  K 4.0 3.4* 3.3* 5.3* 4.9  CL 109 112* 113* 111 109  CO2 24 20* 20* 20* 24  GLUCOSE 146* 256* 198* 105* 173*  BUN 24* 22* 19 18 19   CREATININE 1.39* 0.96 1.00 1.01* 0.86  CALCIUM 7.9* 6.9* 7.0* 7.8* 8.1*  MG 1.3*  --   --  2.5* 2.1   Liver Function Tests: Recent Labs  Lab 01/05/18 1643  AST 23  ALT 11*  ALKPHOS 63  BILITOT 0.6  PROT 7.0  ALBUMIN 3.0*   No results for input(s): LIPASE, AMYLASE in the last 168 hours. No results for input(s): AMMONIA in the last 168 hours. CBC: Recent Labs  Lab 01/06/18 0413 01/07/18 0525 01/08/18 0427 01/09/18 0216 01/10/18 0652 01/11/18 0547  WBC 23.9* 22.1* 20.9* 24.9* 16.8* 14.2*  NEUTROABS 20.0* 20.5* 18.7*  --  14.4* 11.7  HGB 10.5* 8.5* 8.5* 9.3* 9.1* 9.1*  HCT 35.1* 27.8* 26.8* 29.6* 29.5* 29.5*  MCV 95.1 92.7 91.2 90.2 91.3 91.3  PLT 213 191 204 217 223 232   Cardiac Enzymes: No results for input(s): CKTOTAL, CKMB, CKMBINDEX, TROPONINI in the last 168 hours. BNP: Invalid input(s): POCBNP CBG: Recent Labs  Lab 01/10/18 0722 01/10/18 1118 01/10/18 1631 01/10/18 2121 01/11/18 0747  GLUCAP 136* 243* 252* 272* 142*   D-Dimer No results for input(s): DDIMER in the last 72 hours. Hgb A1c No results for input(s): HGBA1C in the last 72 hours. Lipid Profile No results for input(s): CHOL, HDL, LDLCALC, TRIG, CHOLHDL, LDLDIRECT in the last 72 hours. Thyroid function studies No results for input(s): TSH, T4TOTAL, T3FREE, THYROIDAB in the last 72 hours.  Invalid input(s): FREET3 Anemia work up No results for input(s): VITAMINB12, FOLATE, FERRITIN, TIBC, IRON, RETICCTPCT in the last 72 hours. Urinalysis    Component Value Date/Time   COLORURINE YELLOW 01/05/2018 1620   APPEARANCEUR CLOUDY (A) 01/05/2018 1620   LABSPEC 1.011 01/05/2018 1620   PHURINE 7.0 01/05/2018 1620   GLUCOSEU >=500 (A) 01/05/2018 1620   HGBUR MODERATE (A) 01/05/2018 1620   BILIRUBINUR NEGATIVE 01/05/2018 1620   KETONESUR  NEGATIVE 01/05/2018 1620   PROTEINUR 100 (A) 01/05/2018 1620   UROBILINOGEN 0.2 06/30/2015 1317   NITRITE NEGATIVE 01/05/2018 1620   LEUKOCYTESUR LARGE (A) 01/05/2018 1620   Sepsis Labs Invalid input(s): PROCALCITONIN,  WBC,  LACTICIDVEN Microbiology Recent Results (from the past 240 hour(s))  Urine culture     Status: Abnormal   Collection Time: 01/05/18  4:10 PM  Result Value Ref Range Status   Specimen Description   Final    URINE, CATHETERIZED Performed at [facility], [address], [place], [place] [address]    Special Requests   Final    NONE Performed at [facility], [address] [place], [place] [address]    Culture 40,000 COLONIES/mL YEAST (A)  Final   Report Status 01/07/2018 FINAL  Final  Blood Culture (routine x 2)     Status: None   Collection Time: 01/05/18  4:44 PM  Result Value Ref Range Status   Specimen Description BLOOD RIGHT ARM  Final   Special Requests   Final    BOTTLES DRAWN AEROBIC ONLY Blood Culture results may not be optimal due to an inadequate volume of blood received in culture bottles   Culture   Final    NO GROWTH 5 DAYS Performed at [facility], [address], [place], [place] [address]    Report Status 01/10/2018 FINAL  Final  MRSA PCR Screening     Status: None   Collection Time: 01/05/18  8:44 PM  Result Value Ref Range Status   MRSA by PCR NEGATIVE NEGATIVE Final    Comment:  The GeneXpert MRSA Assay (FDA approved for NASAL specimens only), is one component of a comprehensive MRSA colonization surveillance program. It is not intended to diagnose MRSA infection nor to guide or monitor treatment for MRSA infections. Performed at [facility], [address], [place], [place] [address]   Culture, blood (routine x 2)     Status: None   Collection Time: 01/06/18  9:55 AM  Result Value Ref Range Status   Specimen Description BLOOD LEFT HAND  Final   Special Requests   Final    BOTTLES DRAWN AEROBIC ONLY Blood Culture  results may not be optimal due to an inadequate volume of blood received in culture bottles   Culture   Final    NO GROWTH 5 DAYS Performed at [facility], [address], [place], [place] [address]    Report Status 01/11/2018 FINAL  Final  Culture, blood (routine x 2)     Status: None   Collection Time: 01/06/18 10:00 AM  Result Value Ref Range Status   Specimen Description LEFT ANTECUBITAL  Final   Special Requests   Final    BOTTLES DRAWN AEROBIC ONLY Blood Culture results may not be optimal due to an inadequate volume of blood received in culture bottles   Culture   Final    NO GROWTH 5 DAYS Performed at [facility], [address], [place], [place] [address]    Report Status 01/11/2018 FINAL  Final   Time coordinating discharge: 34 minutes  SIGNED:  Irwin Brakeman, MD  [facility] 01/11/2018, 11:47 AM Pager [phone]  If 7PM-7AM, please contact night-coverage [email or web address] Password [email or web address]

## 2018-01-11 NOTE — Care Management Note (Signed)
Case Management Note  Patient Details  Name: [name] MRN: [id number] Date of Birth: 1925/11/11  Expected Discharge Date:  01/11/18               Expected Discharge Plan:  Home/Self Care  In-House Referral:  NA  Discharge planning Services  CM Consult  Post Acute Care Choice:  NA Choice offered to:  NA  Status of Service:  Completed, signed off  If discussed at Long Length of Stay Meetings, dates discussed:    Additional Comments: DC home today with resumption of previous care giving arrangements. No CM needs noted.   Sherald Barge, RN 01/11/2018, 12:36 PM

## 2018-01-11 NOTE — Progress Notes (Signed)
Discharge instructions read to family. All verbalized understanding of all instructions.  Discharged to home with family.

## 2018-02-11 ENCOUNTER — Other Ambulatory Visit: Payer: Self-pay

## 2018-02-11 ENCOUNTER — Observation Stay (HOSPITAL_COMMUNITY)
Admission: EM | Admit: 2018-02-11 | Discharge: 2018-02-12 | Disposition: A | Discharge status: Home / Self Care | Payer: Medicare Other | Attending: Family Medicine | Admitting: Family Medicine

## 2018-02-11 ENCOUNTER — Encounter (HOSPITAL_COMMUNITY): Payer: Self-pay | Admitting: Emergency Medicine

## 2018-02-11 ENCOUNTER — Emergency Department (HOSPITAL_COMMUNITY): Payer: Medicare Other

## 2018-02-11 ENCOUNTER — Other Ambulatory Visit (HOSPITAL_COMMUNITY): Payer: Self-pay | Admitting: *Deleted

## 2018-02-11 ENCOUNTER — Observation Stay (HOSPITAL_COMMUNITY): Payer: Medicare Other

## 2018-02-11 DIAGNOSIS — E872 Acidosis, unspecified: Secondary | ICD-10-CM | POA: Diagnosis present

## 2018-02-11 DIAGNOSIS — N390 Urinary tract infection, site not specified: Secondary | ICD-10-CM | POA: Diagnosis not present

## 2018-02-11 DIAGNOSIS — A419 Sepsis, unspecified organism: Principal | ICD-10-CM | POA: Insufficient documentation

## 2018-02-11 DIAGNOSIS — E860 Dehydration: Secondary | ICD-10-CM | POA: Diagnosis not present

## 2018-02-11 DIAGNOSIS — J45909 Unspecified asthma, uncomplicated: Secondary | ICD-10-CM | POA: Diagnosis present

## 2018-02-11 DIAGNOSIS — C50419 Malignant neoplasm of upper-outer quadrant of unspecified female breast: Secondary | ICD-10-CM

## 2018-02-11 DIAGNOSIS — N3000 Acute cystitis without hematuria: Secondary | ICD-10-CM | POA: Diagnosis not present

## 2018-02-11 DIAGNOSIS — R531 Weakness: Secondary | ICD-10-CM | POA: Diagnosis present

## 2018-02-11 DIAGNOSIS — I10 Essential (primary) hypertension: Secondary | ICD-10-CM | POA: Insufficient documentation

## 2018-02-11 DIAGNOSIS — M069 Rheumatoid arthritis, unspecified: Secondary | ICD-10-CM | POA: Diagnosis present

## 2018-02-11 DIAGNOSIS — E119 Type 2 diabetes mellitus without complications: Secondary | ICD-10-CM | POA: Insufficient documentation

## 2018-02-11 DIAGNOSIS — C50412 Malignant neoplasm of upper-outer quadrant of left female breast: Secondary | ICD-10-CM

## 2018-02-11 DIAGNOSIS — L8990 Pressure ulcer of unspecified site, unspecified stage: Secondary | ICD-10-CM | POA: Diagnosis present

## 2018-02-11 LAB — COMPREHENSIVE METABOLIC PANEL
ALT: 18 U/L (ref 14–54)
AST: 24 U/L (ref 15–41)
Albumin: 3.1 g/dL — ABNORMAL LOW (ref 3.5–5.0)
Alkaline Phosphatase: 56 U/L (ref 38–126)
Anion gap: 10 (ref 5–15)
BILIRUBIN TOTAL: 0.5 mg/dL (ref 0.3–1.2)
BUN: 29 mg/dL — AB (ref 6–20)
CALCIUM: 9.5 mg/dL (ref 8.9–10.3)
CO2: 31 mmol/L (ref 22–32)
Chloride: 96 mmol/L — ABNORMAL LOW (ref 101–111)
Creatinine, Ser: 1.29 mg/dL — ABNORMAL HIGH (ref 0.44–1.00)
GFR calc Af Amer: 41 mL/min — ABNORMAL LOW (ref >60)
GFR, EST NON AFRICAN AMERICAN: 36 mL/min — AB (ref >60)
Glucose, Bld: 317 mg/dL — ABNORMAL HIGH (ref 65–99)
POTASSIUM: 5.2 mmol/L — AB (ref 3.5–5.1)
Sodium: 137 mmol/L (ref 135–145)
TOTAL PROTEIN: 7.4 g/dL (ref 6.5–8.1)

## 2018-02-11 LAB — CBC WITH DIFFERENTIAL/PLATELET
BASOS ABS: 0 10*3/uL (ref 0.0–0.1)
BASOS PCT: 0 %
EOS ABS: 0.1 10*3/uL (ref 0.0–0.7)
Eosinophils Relative: 0 %
HCT: 39.6 % (ref 36.0–46.0)
Hemoglobin: 11.9 g/dL — ABNORMAL LOW (ref 12.0–15.0)
LYMPHS PCT: 19 %
Lymphs Abs: 2.1 10*3/uL (ref 0.7–4.0)
MCH: 28.1 pg (ref 26.0–34.0)
MCHC: 30.1 g/dL (ref 30.0–36.0)
MCV: 93.6 fL (ref 78.0–100.0)
Monocytes Absolute: 0.6 10*3/uL (ref 0.1–1.0)
Monocytes Relative: 5 %
Neutro Abs: 8.4 10*3/uL — ABNORMAL HIGH (ref 1.7–7.7)
Neutrophils Relative %: 76 %
PLATELETS: 270 10*3/uL (ref 150–400)
RBC: 4.23 MIL/uL (ref 3.87–5.11)
RDW: 14.4 % (ref 11.5–15.5)
WBC: 11.2 10*3/uL — AB (ref 4.0–10.5)

## 2018-02-11 LAB — CBG MONITORING, ED: Glucose-Capillary: 256 mg/dL — ABNORMAL HIGH (ref 65–99)

## 2018-02-11 LAB — URINALYSIS, ROUTINE W REFLEX MICROSCOPIC
BILIRUBIN URINE: NEGATIVE
KETONES UR: NEGATIVE mg/dL
Nitrite: NEGATIVE
PROTEIN: 30 mg/dL — AB
Specific Gravity, Urine: 1.015 (ref 1.005–1.030)
pH: 7 (ref 5.0–8.0)

## 2018-02-11 LAB — LACTIC ACID, PLASMA
LACTIC ACID, VENOUS: 3.3 mmol/L — AB (ref 0.5–1.9)
LACTIC ACID, VENOUS: 2.7 mmol/L — AB (ref 0.5–1.9)
Lactic Acid, Venous: 1.1 mmol/L (ref 0.5–1.9)

## 2018-02-11 LAB — GLUCOSE, CAPILLARY: GLUCOSE-CAPILLARY: 199 mg/dL — AB (ref 65–99)

## 2018-02-11 LAB — URINALYSIS, MICROSCOPIC (REFLEX)

## 2018-02-11 LAB — LIPASE, BLOOD: LIPASE: 33 U/L (ref 11–51)

## 2018-02-11 LAB — TROPONIN I

## 2018-02-11 MED ORDER — METOPROLOL SUCCINATE ER 25 MG PO TB24
12.5000 mg | ORAL_TABLET | Freq: Every day | ORAL | Status: DC
  Administered 2018-02-11 – 2018-02-12 (×2): 12.5 mg via ORAL
  Filled 2018-02-11 (×6): qty 1

## 2018-02-11 MED ORDER — ATORVASTATIN CALCIUM 10 MG PO TABS
10.0000 mg | ORAL_TABLET | Freq: Every evening | ORAL | Status: DC
  Administered 2018-02-11: 10 mg via ORAL
  Filled 2018-02-11: qty 1

## 2018-02-11 MED ORDER — BISACODYL 10 MG RE SUPP
10.0000 mg | Freq: Once | RECTAL | Status: AC
  Administered 2018-02-11: 10 mg via RECTAL
  Filled 2018-02-11: qty 1

## 2018-02-11 MED ORDER — SODIUM CHLORIDE 0.9 % IV SOLN
INTRAVENOUS | Status: DC
  Administered 2018-02-11 – 2018-02-12 (×3): via INTRAVENOUS

## 2018-02-11 MED ORDER — POLYVINYL ALCOHOL 1.4 % OP SOLN
1.0000 [drp] | Freq: Every morning | OPHTHALMIC | Status: DC
  Administered 2018-02-12: 1 [drp] via OPHTHALMIC
  Filled 2018-02-11: qty 15

## 2018-02-11 MED ORDER — IPRATROPIUM-ALBUTEROL 0.5-2.5 (3) MG/3ML IN SOLN
3.0000 mL | Freq: Two times a day (BID) | RESPIRATORY_TRACT | Status: DC
  Administered 2018-02-11 – 2018-02-12 (×2): 3 mL via RESPIRATORY_TRACT
  Filled 2018-02-11 (×2): qty 3

## 2018-02-11 MED ORDER — VITAMIN D 1000 UNITS PO TABS
2000.0000 [IU] | ORAL_TABLET | Freq: Every day | ORAL | Status: DC
  Administered 2018-02-12: 2000 [IU] via ORAL
  Filled 2018-02-11: qty 2

## 2018-02-11 MED ORDER — METOPROLOL SUCCINATE ER 25 MG PO TB24
ORAL_TABLET | ORAL | Status: AC
  Filled 2018-02-11: qty 1

## 2018-02-11 MED ORDER — VANCOMYCIN HCL IN DEXTROSE 1-5 GM/200ML-% IV SOLN: 1000.0000 mg | Freq: Once | INTRAVENOUS | Status: DC

## 2018-02-11 MED ORDER — INSULIN ASPART 100 UNIT/ML ~~LOC~~ SOLN
0.0000 [IU] | Freq: Three times a day (TID) | SUBCUTANEOUS | Status: DC
  Administered 2018-02-12 (×2): 2 [IU] via SUBCUTANEOUS

## 2018-02-11 MED ORDER — LEVOFLOXACIN IN D5W 500 MG/100ML IV SOLN
500.0000 mg | INTRAVENOUS | Status: DC
  Administered 2018-02-11: 500 mg via INTRAVENOUS
  Filled 2018-02-11: qty 100

## 2018-02-11 MED ORDER — SODIUM CHLORIDE 0.9 % IV BOLUS
1000.0000 mL | Freq: Once | INTRAVENOUS | Status: AC
  Administered 2018-02-11: 1000 mL via INTRAVENOUS

## 2018-02-11 MED ORDER — ONDANSETRON HCL 4 MG PO TABS: 4.0000 mg | ORAL_TABLET | Freq: Four times a day (QID) | ORAL | Status: DC | PRN

## 2018-02-11 MED ORDER — LEVOFLOXACIN IN D5W 750 MG/150ML IV SOLN
750.0000 mg | Freq: Once | INTRAVENOUS | Status: DC
  Filled 2018-02-11: qty 150

## 2018-02-11 MED ORDER — MIRTAZAPINE 15 MG PO TABS
15.0000 mg | ORAL_TABLET | Freq: Every day | ORAL | Status: DC
  Administered 2018-02-11: 15 mg via ORAL
  Filled 2018-02-11: qty 1

## 2018-02-11 MED ORDER — ONDANSETRON HCL 4 MG/2ML IJ SOLN
4.0000 mg | Freq: Four times a day (QID) | INTRAMUSCULAR | Status: DC | PRN
  Administered 2018-02-12: 4 mg via INTRAVENOUS
  Filled 2018-02-11: qty 2

## 2018-02-11 MED ORDER — RALOXIFENE HCL 60 MG PO TABS: 60.0000 mg | ORAL_TABLET | Freq: Every day | ORAL | Status: DC

## 2018-02-11 MED ORDER — POLYSACCHARIDE IRON COMPLEX 150 MG PO CAPS
150.0000 mg | ORAL_CAPSULE | Freq: Every day | ORAL | Status: DC
  Administered 2018-02-11 – 2018-02-12 (×2): 150 mg via ORAL
  Filled 2018-02-11 (×2): qty 1

## 2018-02-11 MED ORDER — PANTOPRAZOLE SODIUM 40 MG PO TBEC
40.0000 mg | DELAYED_RELEASE_TABLET | Freq: Every day | ORAL | Status: DC
  Administered 2018-02-12: 40 mg via ORAL
  Filled 2018-02-11: qty 1

## 2018-02-11 MED ORDER — DOCUSATE SODIUM 100 MG PO CAPS
100.0000 mg | ORAL_CAPSULE | Freq: Two times a day (BID) | ORAL | Status: DC
  Administered 2018-02-11 – 2018-02-12 (×2): 100 mg via ORAL
  Filled 2018-02-11 (×2): qty 1

## 2018-02-11 MED ORDER — INSULIN GLARGINE 100 UNIT/ML ~~LOC~~ SOLN
3.0000 [IU] | Freq: Every day | SUBCUTANEOUS | Status: DC
  Administered 2018-02-11: 3 [IU] via SUBCUTANEOUS
  Filled 2018-02-11 (×4): qty 0.03

## 2018-02-11 MED ORDER — TAMOXIFEN CITRATE 20 MG PO TABS: 20.0000 mg | ORAL_TABLET | Freq: Every day | ORAL | Status: DC

## 2018-02-11 MED ORDER — ASPIRIN EC 81 MG PO TBEC
81.0000 mg | DELAYED_RELEASE_TABLET | Freq: Two times a day (BID) | ORAL | Status: DC
  Administered 2018-02-11 – 2018-02-12 (×2): 81 mg via ORAL
  Filled 2018-02-11 (×2): qty 1

## 2018-02-11 MED ORDER — HYPROMELLOSE (GONIOSCOPIC) 2.5 % OP SOLN
1.0000 [drp] | Freq: Every morning | OPHTHALMIC | Status: DC
  Filled 2018-02-11: qty 15

## 2018-02-11 MED ORDER — IRON POLYSACCH CMPLX-B12-FA 150-0.025-1 MG PO CAPS: 1.0000 | ORAL_CAPSULE | Freq: Every day | ORAL | Status: DC

## 2018-02-11 MED ORDER — B COMPLEX-C PO TABS
1.0000 | ORAL_TABLET | Freq: Every day | ORAL | Status: DC
  Administered 2018-02-11 – 2018-02-12 (×2): 1 via ORAL
  Filled 2018-02-11 (×2): qty 1

## 2018-02-11 MED ORDER — SODIUM CHLORIDE 0.9 % IV SOLN
2.0000 g | Freq: Once | INTRAVENOUS | Status: DC
  Filled 2018-02-11: qty 2

## 2018-02-11 NOTE — ED Notes (Signed)
Vascular nurse here to insert PICC at this time

## 2018-02-11 NOTE — ED Notes (Signed)
CRITICAL VALUE ALERT  Critical Value:  Lactic acid 2.7  Date & Time Notied:  6606 02/11/18  Provider Notified: Long  Orders Received/Actions taken: na

## 2018-02-11 NOTE — ED Provider Notes (Signed)
Emergency Department Provider Note   I have reviewed the triage vital signs and the nursing notes.   HISTORY  Chief Complaint Weakness   HPI [name] is a 84 y.o. female with PMH of breast cancer on PO Tamoxifen, intermittent UTI, DM, HTN, RA, and kyphosis presents to the emergency department with family for evaluation of worsening generalized weakness and decreased appetite.  The patient was admitted last month for urinary tract infection as well as other metabolic derangements.  The patient was discharged on 4/8 and ultimately returned home.  She has followed up with urology and reportedly had a normal urine test on 5/1.  Family state that they have been to the primary care physician after the discharge but the patient was not exhibiting her current symptoms at that time so no discussion was had regarding this.  She does have follow-up scheduled with her oncologist tomorrow.  Family are concerned because the patient is refusing to eat any solid foods.  They states she will hold them in her mouth and not swallow.  They are able to get her to take her pills by crushing them up and putting them into yogurt or applesauce.  Patient is drinking some fluids.  They have not noticed any fevers.  At baseline the patient is typically able to help support her weight when her family members are picking her up but she is no longer able to do this.    Past Medical History:  Diagnosis Date  . Acid reflux   . Anemia   . Arthritis    ra  . Asthma   . Cancer [facility])    breast cancer  . Coronary artery disease   . Diabetes mellitus without complication ([place])   . Diverticulitis   . Gastric ulcer   . Hyperlipidemia   . Hypertension   . Hypoxemia   . Kyphosis   . Kyphosis   . Mitral valve disorder   . Osteoporosis   . Oxygen dependent    2 liters at night  . Raynaud disease   . Thrombocytopenia [facility])     Patient Active Problem List   Diagnosis Date Noted  . SVT (supraventricular  tachycardia) ([place]) 01/06/2018  . Sepsis secondary to UTI ([place]) 01/05/2018  . Hypernatremia 01/05/2018  . Decubitus ulcer of thoracic spine area, stage III ([place]) 01/05/2018  . Decubitus ulcer of sacral region, stage 2 01/05/2018  . Anemia 01/05/2018  . HCAP (healthcare-associated pneumonia) 03/06/2017  . Hyperglycemia 03/03/2017  . Electrolyte abnormality 03/03/2017  . Pressure injury of skin 01/16/2017  . Dehydration   . Lactic acidosis 07/19/2016  . Asthma, chronic 04/12/2016  . Sepsis ([place]) 03/08/2016  . CAP (community acquired pneumonia) 10/09/2015  . Pressure ulcer 10/09/2015  . Kyphosis   . UTI (urinary tract infection) 06/30/2015  . Breast cancer of upper-outer quadrant of left female breast ([place]) 05/09/2015  . Rheumatoid arthritis ([place]) 10/29/2012  . HTN (hypertension) 10/29/2012  . Diabetes type 2, controlled ([place]) 10/29/2012  . GERD (gastroesophageal reflux disease) 10/29/2012    Past Surgical History:  Procedure Laterality Date  . BREAST SURGERY    . HERNIA REPAIR    . MASTECTOMY Right 1978  . SIMPLE MASTECTOMY WITH AXILLARY SENTINEL NODE BIOPSY Left 05/09/2015   Procedure: LEFT SIMPLE MASTECTOMY;  Surgeon: Erroll Luna, MD;  Location: [place];  Service: General;  Laterality: Left;      Allergies Aspirin; Megace [megestrol]; and Rocephin [ceftriaxone sodium in dextrose]  Family History  Problem Relation Age  of Onset  . Asthma Sister   . Rheum arthritis Daughter     Social History Social History   Tobacco Use  . Smoking status: Never Smoker  . Smokeless tobacco: Never Used  Substance Use Topics  . Alcohol use: No  . Drug use: No    Review of Systems  Constitutional: No fever/chills. Positive generalized weakness and decreased appetite.  Eyes: No visual changes. ENT: No sore throat. Cardiovascular: Denies chest pain. Respiratory: Denies shortness of breath. Gastrointestinal: No abdominal pain.  No nausea, no vomiting.  No diarrhea.  No  constipation. Genitourinary: Negative for dysuria. Musculoskeletal: Negative for back pain. Skin: Negative for rash. Neurological: Negative for headaches, focal weakness or numbness.  10-point ROS otherwise negative.  ____________________________________________   PHYSICAL EXAM:  VITAL SIGNS: ED Triage Vitals  Enc Vitals Group     BP 02/11/18 1248 (!) 119/43     Pulse Rate 02/11/18 1248 86     Resp 02/11/18 1248 16     Temp 02/11/18 1248 98.4 F (36.9 C)     Temp Source 02/11/18 1248 Oral     SpO2 02/11/18 1248 97 %     Weight 02/11/18 1249 115 lb (52.2 kg)     Height 02/11/18 1249 5\' 3"  (1.6 m)     Pain Score 02/11/18 1250 0   Constitutional: Alert and oriented. Chronically ill-appearing but no acute distress.  Eyes: Conjunctivae are normal.  Head: Atraumatic. Nose: No congestion/rhinnorhea. Mouth/Throat: Mucous membranes are moist.  Oropharynx non-erythematous. Neck: No stridor.   Cardiovascular: Normal rate, regular rhythm. Good peripheral circulation. Grossly normal heart sounds.   Respiratory: Normal respiratory effort.  No retractions. Lungs CTAB. Gastrointestinal: Soft and nontender. No distention.  Musculoskeletal: No lower extremity tenderness nor edema. Patient with significant spine kyphosis and hand abnormality (baseline).  Neurologic:  Normal speech and language. No gross focal neurologic deficits are appreciated.  Skin:  Skin is warm, dry and intact. No rash noted. ____________________________________________   LABS (all labs ordered are listed, but only abnormal results are displayed)  Labs Reviewed  COMPREHENSIVE METABOLIC PANEL - Abnormal; Notable for the following components:      Result Value   Potassium 5.2 (*)    Chloride 96 (*)    Glucose, Bld 317 (*)    BUN 29 (*)    Creatinine, Ser 1.29 (*)    Albumin 3.1 (*)    GFR calc non Af Amer 36 (*)    GFR calc Af Amer 41 (*)    All other components within normal limits  CBC WITH  DIFFERENTIAL/PLATELET - Abnormal; Notable for the following components:   WBC 11.2 (*)    Hemoglobin 11.9 (*)    Neutro Abs 8.4 (*)    All other components within normal limits  LACTIC ACID, PLASMA - Abnormal; Notable for the following components:   Lactic Acid, Venous 3.3 (*)    All other components within normal limits  LACTIC ACID, PLASMA - Abnormal; Notable for the following components:   Lactic Acid, Venous 2.7 (*)    All other components within normal limits  URINALYSIS, ROUTINE W REFLEX MICROSCOPIC - Abnormal; Notable for the following components:   Glucose, UA >=500 (*)    Hgb urine dipstick MODERATE (*)    Protein, ur 30 (*)    Leukocytes, UA MODERATE (*)    All other components within normal limits  URINALYSIS, MICROSCOPIC (REFLEX) - Abnormal; Notable for the following components:   Bacteria, UA MANY (*)  All other components within normal limits  GLUCOSE, CAPILLARY - Abnormal; Notable for the following components:   Glucose-Capillary 199 (*)    All other components within normal limits  CBG MONITORING, ED - Abnormal; Notable for the following components:   Glucose-Capillary 256 (*)    All other components within normal limits  CULTURE, BLOOD (ROUTINE X 2)  CULTURE, BLOOD (ROUTINE X 2)  URINE CULTURE  TROPONIN I  LIPASE, BLOOD  LACTIC ACID, PLASMA  LACTIC ACID, PLASMA  BASIC METABOLIC PANEL  CBC   ____________________________________________  EKG   EKG Interpretation  Date/Time:  Thursday Feb 11 2018 13:06:25 EDT Ventricular Rate:  83 PR Interval:    QRS Duration: 95 QT Interval:  403 QTC Calculation: 474 R Axis:   -14 Text Interpretation:  Sinus rhythm Left ventricular hypertrophy No STEMI.  Confirmed by Nanda Quinton [id number]) on 02/11/2018 1:23:58 PM       ____________________________________________  RADIOLOGY  Dg Chest Portable 1 View  Result Date: 02/11/2018 CLINICAL DATA:  Jugular catheter placement EXAM: PORTABLE CHEST 1 VIEW COMPARISON:  1400  hours FINDINGS: Right jugular central venous catheter has been placed. The tip projects over the cavoatrial junction. Thorax is rotated to the left. No obvious pneumothorax. Left basilar atelectasis versus airspace disease is stable. IMPRESSION: Right jugular venous catheter with its tip projecting over the cavoatrial junction. No pneumothorax. Electronically Signed   By: Marybelle Killings M.D.   On: 02/11/2018 17:13   Dg Abdomen Acute W/chest  Result Date: 02/11/2018 CLINICAL DATA:  Cough and decreased p.o. intake EXAM: DG ABDOMEN ACUTE W/ 1V CHEST COMPARISON:  Chest radiograph 01/05/2018 FINDINGS: Examination is degraded by difficulties with patient positioning. There is elevation of the left hemidiaphragm. The right lung is clear. The midportion of the left lung is clear, but the visualization of the rest of the left lung is very limited. There is no free intraperitoneal air. There is a large amount of stool throughout the colon, predominantly on the left side. Within the rectum is a large stool ball with surrounding gas. IMPRESSION: 1. Limited assessment of the chest due to difficulties with patient positioning but no evidence of acute cardiopulmonary disease. 2. Large amount of stool within the distal colon, including large rectal stool ball suggesting fecal impaction. Electronically Signed   By: Ulyses Jarred M.D.   On: 02/11/2018 15:23    ____________________________________________   PROCEDURES  Procedure(s) performed:   .Critical Care Performed by: Margette Fast, MD Authorized by: Margette Fast, MD   Critical care provider statement:    Critical care time (minutes):  35   Critical care time was exclusive of:  Separately billable procedures and treating other patients and teaching time   Critical care was necessary to treat or prevent imminent or life-threatening deterioration of the following conditions:  Sepsis   Critical care was time spent personally by me on the following activities:   Blood draw for specimens, development of treatment plan with patient or surrogate, evaluation of patient's response to treatment, examination of patient, obtaining history from patient or surrogate, ordering and performing treatments and interventions, ordering and review of laboratory studies, ordering and review of radiographic studies, pulse oximetry, re-evaluation of patient's condition and review of old charts   I assumed direction of critical care for this patient from another provider in my specialty: no     ____________________________________________   INITIAL IMPRESSION / ASSESSMENT AND PLAN / ED COURSE  Pertinent labs & imaging results that were available during  my care of the patient were reviewed by me and considered in my medical decision making (see chart for details).  Patient with past medical history of breast cancer on oral tamoxifen and frequent UTI presents with worsening generalized weakness over the past 3 weeks with decreased appetite.  Patient has been given appetite stimulating medication with no significant improvement although is only taken 1-2 doses of this.  At baseline, the patient is not ambulatory but is typically able to support herself during transfers to the bedside commode which she is no longer able to do.  She is afebrile here.  She denies being in any pain.  Plan for labs including UA along with chest x-ray.  Given her decreased appetite will obtain an abdominal series as well.  Plain films and labs reviewed.  The patient has a lactic acidosis and possibly recurrent urinary tract infection.  She is afebrile here but this may account for her generalized weakness at home.  Plan to start broad-spectrum antibiotics and IV fluids.  She also had elevated lactic acid during her last admission which was ultimately thought to be from dehydration.  Her last urine culture grew yeast but no bacteria.  Plan for hospitalist admission.   Discussed patient's case with  Hospitalist, Dr. Shanon Brow to request admission. Patient and family (if present) updated with plan. Care transferred to Hospitalist service.  I reviewed all nursing notes, vitals, pertinent old records, EKGs, labs, imaging (as available).  ____________________________________________  FINAL CLINICAL IMPRESSION(S) / ED DIAGNOSES  Final diagnoses:  Sepsis, due to unspecified organism ([place])  Urinary tract infection without hematuria, site unspecified     MEDICATIONS GIVEN DURING THIS VISIT:  Medications  aspirin EC tablet 81 mg (has no administration in time range)  pantoprazole (PROTONIX) EC tablet 40 mg (has no administration in time range)  cholecalciferol (VITAMIN D) tablet 2,000 Units (has no administration in time range)  atorvastatin (LIPITOR) tablet 10 mg (has no administration in time range)  ipratropium-albuterol (DUONEB) 0.5-2.5 (3) MG/3ML nebulizer solution 3 mL (3 mLs Nebulization Given 02/11/18 2004)  insulin glargine (LANTUS) injection 3 Units (has no administration in time range)  tamoxifen (NOLVADEX) tablet 20 mg (has no administration in time range)  raloxifene (EVISTA) tablet 60 mg (has no administration in time range)  metoprolol succinate (TOPROL-XL) 24 hr tablet 12.5 mg (has no administration in time range)  mirtazapine (REMERON) tablet 15 mg (has no administration in time range)  0.9 %  sodium chloride infusion ( Intravenous New Bag/Given 02/11/18 1931)  ondansetron (ZOFRAN) tablet 4 mg (has no administration in time range)    Or  ondansetron (ZOFRAN) injection 4 mg (has no administration in time range)  insulin aspart (novoLOG) injection 0-9 Units (0 Units Subcutaneous Not Given 02/11/18 1858)  levofloxacin (LEVAQUIN) IVPB 500 mg (500 mg Intravenous New Bag/Given 02/11/18 2058)  iron polysaccharides (NIFEREX) capsule 150 mg (has no administration in time range)    And  B-complex with vitamin C tablet 1 tablet (has no administration in time range)  polyvinyl alcohol  (LIQUIFILM TEARS) 1.4 % ophthalmic solution 1 drop (has no administration in time range)  docusate sodium (COLACE) capsule 100 mg (has no administration in time range)  sodium chloride 0.9 % bolus 1,000 mL (0 mLs Intravenous Stopped 02/11/18 1857)  bisacodyl (DULCOLAX) suppository 10 mg (10 mg Rectal Given 02/11/18 2057)    Note:  This document was prepared using Dragon voice recognition software and may include unintentional dictation errors.  Nanda Quinton, MD Emergency Medicine  Margette Fast, MD 02/11/18 2139

## 2018-02-11 NOTE — ED Notes (Signed)
Vascular Access team notified for need for PICC.

## 2018-02-11 NOTE — Progress Notes (Signed)
Pharmacy Antibiotic Note  [name] is a 84 y.o. female admitted on 02/11/2018 with UTI.  Pharmacy has been consulted for levaquin dosing.  Plan: Levaquin 500mg  IV q48h F/U cxs and clinical progress Monitor V/S, labs  Height: 5\' 3"  (160 cm) Weight: 115 lb (52.2 kg) IBW/kg (Calculated) : 52.4  Temp (24hrs), Avg:98.4 F (36.9 C), Min:98.4 F (36.9 C), Max:98.4 F (36.9 C)  Recent Labs  Lab 02/11/18 1310 02/11/18 1347 02/11/18 1552  WBC 11.2*  --   --   CREATININE 1.29*  --   --   LATICACIDVEN  --  3.3* 2.7*    Estimated Creatinine Clearance: 24.4 mL/min (A) (by C-G formula based on SCr of 1.29 mg/dL (H)).    Allergies  Allergen Reactions  . Aspirin Other (See Comments)    Cannot take uncoated due to stomach ulcers  . Megace [Megestrol]     abd pain   . Rocephin [Ceftriaxone Sodium In Dextrose] Swelling    Antimicrobials this admission: levaquin 5/9 >>   Dose adjustments this admission: N/a  Microbiology results: 5/9 BCx: pending 5/9 UCx: pending  Thank you for allowing pharmacy to be a part of this patient's care.  Isac Sarna, BS [place], [place] Clinical Pharmacist Pager [phone] 02/11/2018 5:28 PM

## 2018-02-11 NOTE — ED Notes (Signed)
CRITICAL VALUE ALERT  Critical Value:  Lactic acid 3.3  Date & Time Notied:  02/11/18 1444  Provider Notified: Long  Orders Received/Actions taken: NA

## 2018-02-11 NOTE — ED Triage Notes (Signed)
Pt's family members states that pt has become weak, not eating and unable to stand x 3 weeks. Denies n/v/d

## 2018-02-11 NOTE — H&P (Signed)
History and Physical    [name] EXH:[id number] DOB: [birth year] DOA: 02/11/2018  PCP: Moshe Cipro, MD  Patient coming from: Home  Chief Complaint: Not eating or drinking well  HPI: [name] is a 84 y.o. female with medical history significant of severe kyphosis, diabetes, hypertension, asthma is bedbound lives at home with family brought in because she is not eating and drinking very well.  Patient gets frequent UTIs.  Family reports no fevers or nausea vomiting or diarrhea.  She has not been complaining of any pain.  She just reports that she does not have any appetite.  Patient was previously on Megace however it is listed as an allergy for her and family is unaware of this.  They requested that she be placed on something for her appetite.  Patient is being referred for admission for UTI and dehydration.  Review of Systems: As per HPI otherwise 10 point review of systems negative per daughter's  Past Medical History:  Diagnosis Date  . Acid reflux   . Anemia   . Arthritis    ra  . Asthma   . Cancer [facility])    breast cancer  . Coronary artery disease   . Diabetes mellitus without complication ([place])   . Diverticulitis   . Gastric ulcer   . Hyperlipidemia   . Hypertension   . Hypoxemia   . Kyphosis   . Kyphosis   . Mitral valve disorder   . Osteoporosis   . Oxygen dependent    2 liters at night  . Raynaud disease   . Thrombocytopenia ([place])     Past Surgical History:  Procedure Laterality Date  . BREAST SURGERY    . HERNIA REPAIR    . MASTECTOMY Right 1978  . SIMPLE MASTECTOMY WITH AXILLARY SENTINEL NODE BIOPSY Left 05/09/2015   Procedure: LEFT SIMPLE MASTECTOMY;  Surgeon: Erroll Luna, MD;  Location: [facility];  Service: General;  Laterality: Left;     reports that she has never smoked. She has never used smokeless tobacco. She reports that she does not drink alcohol or use drugs.  Allergies  Allergen Reactions  . Aspirin Other (See Comments)    Cannot take  uncoated due to stomach ulcers  . Megace [Megestrol]     abd pain   . Rocephin [Ceftriaxone Sodium In Dextrose] Swelling    Family History  Problem Relation Age of Onset  . Asthma Sister   . Rheum arthritis Daughter     Prior to Admission medications   Medication Sig Start Date End Date Taking? Authorizing Provider  aspirin EC 81 MG tablet Take 81 mg by mouth 2 (two) times daily.    Yes [provider]  atorvastatin (LIPITOR) 10 MG tablet Take 10 mg by mouth every evening.    Yes [provider]  budesonide-formoterol (SYMBICORT) 160-4.5 MCG/ACT inhaler Inhale 2 puffs into the lungs 2 (two) times daily.   Yes [provider]  Cholecalciferol (VITAMIN D3) 2000 units capsule Take 2,000 Units by mouth daily.    Yes [provider]  ciclopirox (PENLAC) 8 % solution APPLY TO FUNGAL TOENAILS EVERY DAY 02/11/16  Yes [provider]  D-MANNOSE PO Take 500 mg by mouth daily.    Yes [provider]  guaiFENesin (MUCINEX) 600 MG 12 hr tablet Take 1 tablet (600 mg total) by mouth 2 (two) times daily. Patient taking differently: Take 600 mg by mouth 2 (two) times daily as needed for cough or to loosen phlegm.  03/07/17  Yes Kathie Dike, MD  insulin aspart (NOVOLOG) 100 UNIT/ML FlexPen Inject 4 Units into the skin 3 (three) times daily with meals. Patient taking differently: Inject 1-10 Units into the skin 3 (three) times daily with meals. Per sliding scale. For levels over 350- 10 units is administered 03/07/17  Yes Memon, Jolaine Artist, MD  Insulin Glargine (LANTUS) 100 UNIT/ML Solostar Pen Inject 5 Units into the skin daily. Patient taking differently: Inject 3 Units into the skin at bedtime.  03/07/17  Yes Kathie Dike, MD  ipratropium-albuterol (DUONEB) 0.5-2.5 (3) MG/3ML SOLN Take 3 mLs by nebulization 2 (two) times daily. 03/07/17  Yes Kathie Dike, MD  lactobacillus acidophilus & bulgar (LACTINEX) chewable tablet CHEW 1 TABLET BY MOUTH EVERY DAY  09/17/15  Yes [provider]  magnesium oxide (MAG-OX) 400 (241.3 Mg) MG tablet Take 0.5 tablets (200 mg total) by mouth daily. Patient taking differently: Take 200-400 mg by mouth daily. Alternating 200 mg one day then 400 mg the next. 04/15/16  Yes Reyne Dumas, MD  metFORMIN (GLUCOPHAGE) 500 MG tablet Take 1,000 mg by mouth 2 (two) times daily with a meal.   Yes [provider]  metoprolol succinate (TOPROL-XL) 25 MG 24 hr tablet Take 0.5 tablets (12.5 mg total) by mouth daily. 01/11/18  Yes Johnson, Clanford L, MD  mirtazapine (REMERON) 15 MG tablet Take 15 mg by mouth at bedtime. 02/08/18  Yes [provider]  Multiple Vitamins-Minerals (CENTRUM SILVER ADULT 50+ PO) Take 1 tablet by mouth every morning.    Yes [provider]  nitrofurantoin (MACRODANTIN) 50 MG capsule Take 50 mg by mouth daily. 01/26/18  Yes [provider]  pantoprazole (PROTONIX) 40 MG tablet Take 40 mg by mouth daily.   Yes [provider]  POLY-IRON 150 FORTE 150-25-1 MG-MCG-MG CAPS TAKE ONE CAPSULE BY MOUTH DAILY 04/07/17  Yes Kefalas, Manon Hilding, PA-C  Polyvinyl Alcohol-Povidone (CLEAR EYES ALL SEASONS) 5-6 MG/ML SOLN Apply 1 drop to eye every morning.    Yes [provider]  predniSONE (DELTASONE) 5 MG tablet Take 2 tablets (10 mg total) by mouth daily with breakfast. Patient taking differently: Take 5 mg by mouth daily with breakfast.  01/20/17  Yes Orvan Falconer, MD  raloxifene (EVISTA) 60 MG tablet Take 60 mg by mouth daily.   Yes [provider]  tamoxifen (NOLVADEX) 20 MG tablet Take 1 tablet (20 mg total) daily by mouth. 08/14/17  Yes Choksi, Delorise Shiner, MD  amiodarone (PACERONE) 200 MG tablet Take 1 tablet (200 mg total) by mouth 2 (two) times daily for 17 days. 01/11/18 01/28/18  Murlean Iba, MD    Physical Exam: Vitals:   02/11/18 1248 02/11/18 1249  BP: (!) 119/43   Pulse: 86   Resp: 16   Temp: 98.4 F (36.9 C)   TempSrc: Oral   SpO2: 97%     Weight:  52.2 kg (115 lb)  Height:  5\' 3"  (1.6 m)      Constitutional: NAD, calm, comfortable Vitals:   02/11/18 1248 02/11/18 1249  BP: (!) 119/43   Pulse: 86   Resp: 16   Temp: 98.4 F (36.9 C)   TempSrc: Oral   SpO2: 97%   Weight:  52.2 kg (115 lb)  Height:  5\' 3"  (1.6 m)   Eyes: PERRL, lids and conjunctivae normal ENMT: Mucous membranes are dry posterior pharynx clear of any exudate or lesions.Normal dentition.  Neck: normal, supple, no masses, no thyromegaly Respiratory: clear to auscultation bilaterally, no wheezing, no crackles.  Normal respiratory effort. No accessory muscle use.  Cardiovascular: Regular rate and rhythm, no murmurs / rubs / gallops. No extremity edema. 2+ pedal pulses. No carotid bruits.  Abdomen: no tenderness, no masses palpated. No hepatosplenomegaly. Bowel sounds positive.  Musculoskeletal: no clubbing / cyanosis. No joint deformity upper and lower extremities. Good ROM, no contractures. Normal muscle tone.  Severe kyphosis Skin: no rashes, lesions, ulcers. No induration Neurologic: CN 2-12 grossly intact. Sensation intact, DTR normal. Strength 5/5 in all 4.  Psychiatric: Normal judgment and insight. Alert and oriented x 3. Normal mood.    Labs on Admission: I have personally reviewed following labs and imaging studies  CBC: Recent Labs  Lab 02/11/18 1310  WBC 11.2*  NEUTROABS 8.4*  HGB 11.9*  HCT 39.6  MCV 93.6  PLT 409   Basic Metabolic Panel: Recent Labs  Lab 02/11/18 1310  NA 137  K 5.2*  CL 96*  CO2 31  GLUCOSE 317*  BUN 29*  CREATININE 1.29*  CALCIUM 9.5   GFR: Estimated Creatinine Clearance: 24.4 mL/min (A) (by C-G formula based on SCr of 1.29 mg/dL (H)). Liver Function Tests: Recent Labs  Lab 02/11/18 1310  AST 24  ALT 18  ALKPHOS 56  BILITOT 0.5  PROT 7.4  ALBUMIN 3.1*   Recent Labs  Lab 02/11/18 1310  LIPASE 33   No results for input(s): AMMONIA in the last 168 hours. Coagulation Profile: No results  for input(s): INR, PROTIME in the last 168 hours. Cardiac Enzymes: Recent Labs  Lab 02/11/18 1310  TROPONINI <0.03   BNP (last 3 results) No results for input(s): PROBNP in the last 8760 hours. HbA1C: No results for input(s): HGBA1C in the last 72 hours. CBG: Recent Labs  Lab 02/11/18 1309  GLUCAP 256*   Lipid Profile: No results for input(s): CHOL, HDL, LDLCALC, TRIG, CHOLHDL, LDLDIRECT in the last 72 hours. Thyroid Function Tests: No results for input(s): TSH, T4TOTAL, FREET4, T3FREE, THYROIDAB in the last 72 hours. Anemia Panel: No results for input(s): VITAMINB12, FOLATE, FERRITIN, TIBC, IRON, RETICCTPCT in the last 72 hours. Urine analysis:    Component Value Date/Time   COLORURINE YELLOW 02/11/2018 1420   APPEARANCEUR CLEAR 02/11/2018 1420   LABSPEC 1.015 02/11/2018 1420   PHURINE 7.0 02/11/2018 1420   GLUCOSEU >=500 (A) 02/11/2018 1420   HGBUR MODERATE (A) 02/11/2018 1420   BILIRUBINUR NEGATIVE 02/11/2018 1420   KETONESUR NEGATIVE 02/11/2018 1420   PROTEINUR 30 (A) 02/11/2018 1420   UROBILINOGEN 0.2 06/30/2015 1317   NITRITE NEGATIVE 02/11/2018 1420   LEUKOCYTESUR MODERATE (A) 02/11/2018 1420   Sepsis Labs: !!!!!!!!!!!!!!!!!!!!!!!!!!!!!!!!!!!!!!!!!!!! @LABRCNTIP (procalcitonin:4,lacticidven:4) ) Recent Results (from the past 240 hour(s))  Blood Culture (routine x 2)     Status: None (Preliminary result)   Collection Time: 02/11/18  3:52 PM  Result Value Ref Range Status   Specimen Description BLOOD RIGHT HAND  Final   Special Requests   Final    BOTTLES DRAWN AEROBIC ONLY Blood Culture adequate volume Performed at [facility], [address] [place], [place] [address]    Culture PENDING  Incomplete   Report Status PENDING  Incomplete  Blood Culture (routine x 2)     Status: None (Preliminary result)   Collection Time: 02/11/18  3:52 PM  Result Value Ref Range Status   Specimen Description BLOOD RIGHT HAND  Final   Special Requests   Final    BOTTLES  DRAWN AEROBIC ONLY Blood Culture adequate volume Performed at [facility], [address], [place],  [place] [address]    Culture PENDING  Incomplete   Report Status PENDING  Incomplete     Radiological Exams on Admission: Dg Abdomen Acute W/chest  Result Date: 02/11/2018 CLINICAL DATA:  Cough and decreased p.o. intake EXAM: DG ABDOMEN ACUTE W/ 1V CHEST COMPARISON:  Chest radiograph 01/05/2018 FINDINGS: Examination is degraded by difficulties with patient positioning. There is elevation of the left hemidiaphragm. The right lung is clear. The midportion of the left lung is clear, but the visualization of the rest of the left lung is very limited. There is no free intraperitoneal air. There is a large amount of stool throughout the colon, predominantly on the left side. Within the rectum is a large stool ball with surrounding gas. IMPRESSION: 1. Limited assessment of the chest due to difficulties with patient positioning but no evidence of acute cardiopulmonary disease. 2. Large amount of stool within the distal colon, including large rectal stool ball suggesting fecal impaction. Electronically Signed   By: Ulyses Jarred M.D.   On: 02/11/2018 15:23    Old chart reviewed Case discussed with ADP  Assessment/Plan 84 year old female with decreased p.o. intake found to have a UTI and constipation Principal Problem:   UTI (urinary tract infection)-previous urine cultures have been reviewed and do not contain any multidrug-resistant organisms.  Placed on IV Rocephin.  Chest x-ray does not show any pneumonia.  Lactic acid mildly elevated at 3.3 which I believe is more so due to dehydration then infection.  Afebrile vital signs are all stable. White count 11.  Active Problems:   Rheumatoid arthritis (HCC)-noted   Asthma, chronic-continue home breathing treatments   Lactic acidosis-IV fluids and serial lactic acid every 3 hours.  Treat infection.  Do not think she is septic I think lactic acidosis is more  likely due to her dehydration   Dehydration-IV fluids as above. Constipation-placed on Colace and provide Dulcolax suppository tonight.  Patient has an oncology appointment tomorrow.  I have instructed the patient's family that if she is discharged early enough she may be able to make her oncology appointment at 2:00 PM.  If not we can reschedule.   DVT prophylaxis: SCDs Code Status: Full Family Communication: Daughters Disposition Plan: Per day team Consults called: None Admission status: Observation   Michelene Keniston A MD [facility]  If 7PM-7AM, please contact night-coverage [email or web address] Password [email or web address]  02/11/2018, 4:15 PM

## 2018-02-11 NOTE — ED Notes (Signed)
Patient transported to X-ray 

## 2018-02-11 NOTE — Progress Notes (Signed)
o2 setup at 2lpm cann for hs per pt home regimen

## 2018-02-12 ENCOUNTER — Ambulatory Visit (HOSPITAL_COMMUNITY): Payer: Medicare Other | Admitting: Internal Medicine

## 2018-02-12 ENCOUNTER — Encounter (HOSPITAL_COMMUNITY): Payer: Self-pay | Admitting: Family Medicine

## 2018-02-12 ENCOUNTER — Other Ambulatory Visit (HOSPITAL_COMMUNITY): Payer: Medicare Other

## 2018-02-12 DIAGNOSIS — M05722 Rheumatoid arthritis with rheumatoid factor of left elbow without organ or systems involvement: Secondary | ICD-10-CM

## 2018-02-12 DIAGNOSIS — E860 Dehydration: Secondary | ICD-10-CM | POA: Diagnosis not present

## 2018-02-12 DIAGNOSIS — N3000 Acute cystitis without hematuria: Secondary | ICD-10-CM | POA: Diagnosis not present

## 2018-02-12 DIAGNOSIS — E872 Acidosis: Secondary | ICD-10-CM | POA: Diagnosis not present

## 2018-02-12 DIAGNOSIS — A419 Sepsis, unspecified organism: Secondary | ICD-10-CM | POA: Diagnosis not present

## 2018-02-12 LAB — BASIC METABOLIC PANEL
ANION GAP: 5 (ref 5–15)
BUN: 18 mg/dL (ref 6–20)
CO2: 27 mmol/L (ref 22–32)
Calcium: 7.5 mg/dL — ABNORMAL LOW (ref 8.9–10.3)
Chloride: 105 mmol/L (ref 101–111)
Creatinine, Ser: 0.84 mg/dL (ref 0.44–1.00)
GFR calc non Af Amer: 60 mL/min — ABNORMAL LOW (ref >60)
GLUCOSE: 160 mg/dL — AB (ref 65–99)
POTASSIUM: 3.7 mmol/L (ref 3.5–5.1)
Sodium: 137 mmol/L (ref 135–145)

## 2018-02-12 LAB — CBC
HCT: 26.9 % — ABNORMAL LOW (ref 36.0–46.0)
HEMOGLOBIN: 8.1 g/dL — AB (ref 12.0–15.0)
MCH: 28 pg (ref 26.0–34.0)
MCHC: 30.1 g/dL (ref 30.0–36.0)
MCV: 93.1 fL (ref 78.0–100.0)
Platelets: 227 10*3/uL (ref 150–400)
RBC: 2.89 MIL/uL — AB (ref 3.87–5.11)
RDW: 14.2 % (ref 11.5–15.5)
WBC: 8.5 10*3/uL (ref 4.0–10.5)

## 2018-02-12 LAB — GLUCOSE, CAPILLARY
GLUCOSE-CAPILLARY: 152 mg/dL — AB (ref 65–99)
Glucose-Capillary: 168 mg/dL — ABNORMAL HIGH (ref 65–99)

## 2018-02-12 LAB — LACTIC ACID, PLASMA: Lactic Acid, Venous: 0.9 mmol/L (ref 0.5–1.9)

## 2018-02-12 MED ORDER — PREDNISONE 5 MG PO TABS: 5.0000 mg | ORAL_TABLET | Freq: Every day | ORAL | Status: AC

## 2018-02-12 MED ORDER — MIRTAZAPINE 15 MG PO TABS: 7.5000 mg | ORAL_TABLET | Freq: Every day | ORAL | 0 refills | Status: DC

## 2018-02-12 MED ORDER — LEVOFLOXACIN 500 MG PO TABS: 500.0000 mg | ORAL_TABLET | Freq: Every day | ORAL | 0 refills | Status: AC

## 2018-02-12 MED ORDER — TAMOXIFEN CITRATE 20 MG PO TABS: 20.0000 mg | ORAL_TABLET | Freq: Every day | ORAL | 1 refills | Status: DC

## 2018-02-12 NOTE — Discharge Instructions (Signed)
Please hold tamoxifen for 6 days while on the antibiotics, then resume tamoxifen as normal.    Follow with Primary MD  Moshe Cipro, MD  and other consultant's as instructed your Hospitalist MD  Please get a complete blood count and chemistry panel checked by your Primary MD at your next visit, and again as instructed by your Primary MD.  Get Medicines reviewed and adjusted: Please take all your medications with you for your next visit with your Primary MD  Laboratory/radiological data: Please request your Primary MD to go over all hospital tests and procedure/radiological results at the follow up, please ask your Primary MD to get all Hospital records sent to his/her office.  In some cases, they will be blood work, cultures and biopsy results pending at the time of your discharge. Please request that your primary care M.D. follows up on these results.  Also Note the following: If you experience worsening of your admission symptoms, develop shortness of breath, life threatening emergency, suicidal or homicidal thoughts you must seek medical attention immediately by calling 911 or calling your MD immediately  if symptoms less severe.  You must read complete instructions/literature along with all the possible adverse reactions/side effects for all the Medicines you take and that have been prescribed to you. Take any new Medicines after you have completely understood and accpet all the possible adverse reactions/side effects.   Do not drive when taking Pain medications or sleeping medications (Benzodaizepines)  Do not take more than prescribed Pain, Sleep and Anxiety Medications. It is not advisable to combine anxiety,sleep and pain medications without talking with your primary care practitioner  Special Instructions: If you have smoked or chewed Tobacco  in the last 2 yrs please stop smoking, stop any regular Alcohol  and or any Recreational drug use.  Wear Seat belts while  driving.  Please note: You were cared for by a hospitalist during your hospital stay. Once you are discharged, your primary care physician will handle any further medical issues. Please note that NO REFILLS for any discharge medications will be authorized once you are discharged, as it is imperative that you return to your primary care physician (or establish a relationship with a primary care physician if you do not have one) for your post hospital discharge needs so that they can reassess your need for medications and monitor your lab values.      Antibiotic Medicine, Adult Antibiotic medicines are used to treat infections caused by bacteria, such as strep throat and urinary tract infection (UTI). Antibiotic medicines will not work for viral illnesses, such as colds or the flu (influenza). They work by killing the bacteria that is making you sick. Antibiotics can also have serious side effects. It is important that you take antibiotic medicines safely and only when needed. When do I need to take antibiotics? Antibiotics are medicines that treat bacterial infections. You may need antibiotics for:  UTI.  Strep throat.  Meningitis. This infection affects the spinal cord and brain.  Bacterial sinusitis.  Serious lung infection.  You may start antibiotics while your health care provider waits for test results to come back. Common tests may include throat, urine, blood, or mucus culture. Your health care provider may change or stop the antibiotic depending on your test results. When are antibiotics not needed? You do not need antibiotics for most common illnesses. These illnesses may be caused by a virus, not a bacteria. You do not need antibiotics for:  The common cold.  Influenza.  Sore throat.  Discolored mucus.  Bronchitis.  Antibiotics are not always needed for all bacterial infections. Many of these infections clear up without antibiotic treatment. Do not ask for or take  antibiotics when they are not necessary. How long should I take the antibiotic? You must take the entire prescription. Continue to take your antibiotic for as long as told by your health care provider. Do not stop taking it even if you start to feel better. If you stop taking it too soon:  You may start to feel sick again.  Your infection may become harder to treat.  Complications may develop.  Each course of antibiotics needs a different amount of time to work. Some antibiotic courses last only a few days. Some last about a week to 10 days. In some cases, you may need to take antibiotics for a few weeks to completely treat the infection. What if I miss a dose? Try not to miss any doses of medicine. If you miss a dose, call your health care provider or pharmacist for advice. Sometimes it is okay to take the missed dose as soon as possible. What are the risks of taking antibiotics? Most antibiotics can cause an infection called Clostridium difficile (C. difficile), which causes severe diarrhea. This infection happens when the antibiotics kill the healthy bacteria in your intestines. This allows C. difficile to grow. The infection needs to be treated right away. Let your health care provider know if:  You have diarrhea while taking an antibiotic.  You have diarrhea after you stop taking an antibiotic. C. difficile infection can start weeks after stopping the antibiotic.  Taking an antibiotic also puts you at risk for getting a bacteria that does not respond to medicine (antibiotic-resistant infection) in the future. Antibiotics can cause bacteria to change so that if the antibiotic is taken again, the medicine is not able to kill the bacteria. These infections can be more serious and, in some cases, life-threatening. Do antibiotics affect birth control? Birth control pills may not work while you are on antibiotics. If you are taking birth control pills, continue taking them as usual and use a  second form of birth control, such as a condom, to avoid unwanted pregnancy. Continue using the second form of birth control until your health care provider says you can stop. What else should I know about taking antibiotics? It is important for you to take antibiotics exactly as told. Make sure that you:  Take the entire course of antibiotic that was prescribed. Do not stop taking your antibiotics even if your symptoms improve.  Take the correct amount of medicine each day.  Ask your health care provider: ? How long to wait in between doses. ? If the antibiotic should be taken with food. ? If there are any foods, drinks, or medicines that you should avoid while taking the antibiotics. ? If there are any side effects you should be aware of.  Only use the antibiotics prescribed for you by your health care provider. Do not use antibiotics prescribed for someone else.  Drink a large glass of water along with the antibiotics.  Ask the pharmacist for a syringe, cup, or spoon that properly measures the antibiotics.  Throw away any leftover medicine.  Contact a health care provider if:  Your symptoms get worse.  You have new joint pain or muscle aches that begin after starting the antibiotic. When should I seek immediate medical care?  You have signs of a serious allergic reaction to  antibiotics. If you have signs of a severe allergic reaction, stop taking the antibiotic right away. Signs may include: ? Hives, which are raised, itchy, red bumps on the skin. ? Skin rash. ? Trouble breathing. ? A wheezing sound when you breathe. ? Swelling anywhere on your body. ? Feeling dizzy. ? Vomiting.  Your urine turns dark or becomes blood-colored.  Your skin turns yellow.  You bruise or bleed easily.  You have severe diarrhea and abdominal cramps.  You have a severe headache. Summary  Antibiotic medicines are used to treat infections caused by bacteria, such as strep throat and UTIs.  It is important that you take antibiotic medicines only when needed.  Your health care provider may change or stop the antibiotic depending on your test results.  Most antibiotics can cause an infection called Clostridium difficile (C. difficile), which causes severe diarrhea. Let your health care provider know if you develop diarrhea while taking an antibiotic.  Take the entire course of antibiotic that was prescribed. This information is not intended to replace advice given to you by your health care provider. Make sure you discuss any questions you have with your health care provider. Document Released: 06/04/2004 Document Revised: 09/23/2016 Document Reviewed: 09/23/2016 Elsevier Interactive Patient Education  2018 [employer].   Urinary Tract Infection, Adult A urinary tract infection (UTI) is an infection of any part of the urinary tract, which includes the kidneys, ureters, bladder, and urethra. These organs make, store, and get rid of urine in the body. UTI can be a bladder infection (cystitis) or kidney infection (pyelonephritis). What are the causes? This infection may be caused by fungi, viruses, or bacteria. Bacteria are the most common cause of UTIs. This condition can also be caused by repeated incomplete emptying of the bladder during urination. What increases the risk? This condition is more likely to develop if:  You ignore your need to urinate or hold urine for long periods of time.  You do not empty your bladder completely during urination.  You wipe back to front after urinating or having a bowel movement, if you are female.  You are uncircumcised, if you are female.  You are constipated.  You have a urinary catheter that stays in place (indwelling).  You have a weak defense (immune) system.  You have a medical condition that affects your bowels, kidneys, or bladder.  You have diabetes.  You take antibiotic medicines frequently or for long periods of time, and  the antibiotics no longer work well against certain types of infections (antibiotic resistance).  You take medicines that irritate your urinary tract.  You are exposed to chemicals that irritate your urinary tract.  You are female.  What are the signs or symptoms? Symptoms of this condition include:  Fever.  Frequent urination or passing small amounts of urine frequently.  Needing to urinate urgently.  Pain or burning with urination.  Urine that smells bad or unusual.  Cloudy urine.  Pain in the lower abdomen or back.  Trouble urinating.  Blood in the urine.  Vomiting or being less hungry than normal.  Diarrhea or abdominal pain.  Vaginal discharge, if you are female.  How is this diagnosed? This condition is diagnosed with a medical history and physical exam. You will also need to provide a urine sample to test your urine. Other tests may be done, including:  Blood tests.  Sexually transmitted disease (STD) testing.  If you have had more than one UTI, a cystoscopy or imaging studies  may be done to determine the cause of the infections. How is this treated? Treatment for this condition often includes a combination of two or more of the following:  Antibiotic medicine.  Other medicines to treat less common causes of UTI.  Over-the-counter medicines to treat pain.  Drinking enough water to stay hydrated.  Follow these instructions at home:  Take over-the-counter and prescription medicines only as told by your health care provider.  If you were prescribed an antibiotic, take it as told by your health care provider. Do not stop taking the antibiotic even if you start to feel better.  Avoid alcohol, caffeine, tea, and carbonated beverages. They can irritate your bladder.  Drink enough fluid to keep your urine clear or pale yellow.  Keep all follow-up visits as told by your health care provider. This is important.  Make sure to: ? Empty your bladder often  and completely. Do not hold urine for long periods of time. ? Empty your bladder before and after sex. ? Wipe from front to back after a bowel movement if you are female. Use each tissue one time when you wipe. Contact a health care provider if:  You have back pain.  You have a fever.  You feel nauseous or vomit.  Your symptoms do not get better after 3 days.  Your symptoms go away and then return. Get help right away if:  You have severe back pain or lower abdominal pain.  You are vomiting and cannot keep down any medicines or water. This information is not intended to replace advice given to you by your health care provider. Make sure you discuss any questions you have with your health care provider. Document Released: 07/02/2005 Document Revised: 03/05/2016 Document Reviewed: 08/13/2015 Elsevier Interactive Patient Education  2018 [place] Prescribed an Antibiotic in the Hospital for an Infection You've Been Prescribed an Antibiotic in the Hospital for an Infection Your healthcare team has decided you or your loved one has an infection that requires antibiotics, or needs antibiotics to prevent an infection in certain circumstances, such as before surgery. Antibiotics save lives, and they are critical tools for treating a number of common infections, such as pneumonia, and for life-threatening conditions such as sepsis. They need to be used properly because they can cause side effects and lead to antibiotic resistance. But when antibiotics are needed, the benefits outweigh the risks of side effects or antibiotic resistance. There are some important things you should know about your antibiotic treatment.  Your healthcare team may run tests before you start taking an antibiotic. ? Your team may take samples (from your blood, urine or other areas) to run tests to look for bacteria. These tests can be important to determine if you need an antibiotic at all and, if you  do, which antibiotic will work best.  After a few days of treatment, your healthcare team might change, or even stop, your antibiotic. ? While they are working to find out what is making you sick, your team has started you on an antibiotic ? If test results show a different antibiotic would be better to treat your infection, they will change your antibiotic. ? Your team may review antibiotic therapy 48 to 72 hours after it is started based on your clinical condition and microbiology culture results, and stop or change antibiotic orders as needed--an important step in your care ? In some cases, once your team has more information, they might decide that you do not  need an antibiotic at all. They may find out that you dont have an infection, or that the antibiotic youre taking wont work against your infection. For example, an infection caused by a virus cant be treated with antibiotics. Staying on an antibiotic when you dont need it wont help you and the side effects could still hurt you.  You may experience side effects from your antibiotic. ? Like all medications, antibiotics have side effects. Some of these can be serious. ? Let your healthcare team know if you have any known allergies when you are admitted to the hospital. ? Common side effects of antibiotics can include rash, dizziness, nausea, yeast infections, and diarrhea. ? The most serious side effects include Clostridium difficile infection (also called C. difficile or C. diff) and life-threatening allergic reactions. C. difficile causes diarrhea that can lead to severe colon damage and death ? Diarrhea caused by C. difficile can be serious and must be recognized and treated quickly. When you are taking an antibiotic and you develop diarrhea, let your healthcare team know immediately. ? The risk of getting C. difficile diarrhea can last for up to several months even after you are no longer getting antibiotics. You should let your  healthcare team know if you develop diarrhea even after you are no longer getting an antibiotic.  As a patient or caregiver, it is important to understand your or your loved one's antibiotic treatment. It is especially important for caregivers to speak up when patients can't speak for themselves. Here are some important questions to ask your healthcare team. ? What infection is this antibiotic treating and how do you know I have that infection? ? Is the antibiotic being prescribed the most targeted to treat the infection while causing the least side effects? ? What side effects might occur from this antibiotic? ? How long will I need to take this antibiotic? ? Is it safe to take this antibiotic with other medications or supplements (e.g., vitamins) that I am taking? ? Are there any special directions I need to know about taking this antibiotic? For example, should I take it with food? ? How will I be monitored to know whether my infection is responding to the antibiotic? ? What tests may help to make sure the right antibiotic is prescribed for me?  Remember, antibiotics are life-saving drugs and they need to be used properly. If you have any questions about your antibiotics, please talk to your healthcare team. To learn more about antibiotic prescribing and use, visit [employer]. CDC - Be Antibiotics Aware: Smart Use, [employer], 01/2017. This information is not intended to replace advice given to you by your health care provider. Make sure you discuss any questions you have with your health care provider. Document Released: 01/29/2017 Document Revised: 01/29/2017 Document Reviewed: 01/29/2017 Elsevier Interactive Patient Education  [employer].

## 2018-02-12 NOTE — Discharge Summary (Signed)
Physician Discharge Summary  [name] TDD:[id number] DOB: [birth year] DOA: 02/11/2018  PCP: Moshe Cipro, MD  Admit date: 02/11/2018 Discharge date: 02/12/2018  Admitted From: Home  Disposition: Home   Recommendations for Outpatient Follow-up:  1. Follow up with PCP in 1 weeks 2. Please obtain BMP/CBC in one week 3. Please follow up on the following pending results: Final Culture Data  Discharge Condition: STABLE   CODE STATUS: FULL    Brief Hospitalization Summary: Please see all hospital notes, images, labs for full details of the hospitalization.  HPI: [name] is a 84 y.o. female with medical history significant of severe kyphosis, diabetes, hypertension, asthma is bedbound lives at home with family brought in because she is not eating and drinking very well.  Patient gets frequent UTIs.  Family reports no fevers or nausea vomiting or diarrhea.  She has not been complaining of any pain.  She just reports that she does not have any appetite.  Patient was previously on Megace however it is listed as an allergy for her and family is unaware of this.  They requested that she be placed on something for her appetite.  Patient is being referred for admission for UTI and dehydration.  The patient perked up after receiving the IV fluids.  She was started on IV levofloxacin.  She did very well.  Her family remained at bedside and said that she was at her baseline.  They reported that she has an appointment with her oncologist later today and they would like to make that appointment.  I think the patient is stable to discharge home.  We will give her a prescription for levofloxacin to take oral tablets for the next several days until completing the course.  The patient will reduce the mirtazapine to 7.5 mg daily.  The 15 mg dose was too strong and she became too sedated.  I asked the family to consider cutting the tablets in half and giving it at suppertime.  They will try this and follow-up  with primary care physician to discuss further.  Discharge Diagnoses:  Principal Problem:   UTI (urinary tract infection) Active Problems:   Rheumatoid arthritis (HCC)   Asthma, chronic   Lactic acidosis   Dehydration   Pressure injury of skin  Discharge Instructions: Discharge Instructions    Call MD for:  difficulty breathing, headache or visual disturbances   Complete by:  As directed    Call MD for:  extreme fatigue   Complete by:  As directed    Call MD for:  persistant dizziness or light-headedness   Complete by:  As directed    Call MD for:  persistant nausea and vomiting   Complete by:  As directed    Call MD for:  severe uncontrolled pain   Complete by:  As directed    Discontinue IV   Complete by:  As directed    Increase activity slowly   Complete by:  As directed      Allergies as of 02/12/2018      Reactions   Aspirin Other (See Comments)   Cannot take uncoated due to stomach ulcers   Megace [megestrol]    abd pain   Rocephin [ceftriaxone Sodium In Dextrose] Swelling      Medication List    STOP taking these medications   amiodarone 200 MG tablet Commonly known as:  PACERONE   nitrofurantoin 50 MG capsule Commonly known as:  MACRODANTIN   raloxifene 60 MG tablet Commonly known as:  EVISTA     TAKE these medications   aspirin EC 81 MG tablet Take 81 mg by mouth 2 (two) times daily.   atorvastatin 10 MG tablet Commonly known as:  LIPITOR Take 10 mg by mouth every evening.   budesonide-formoterol 160-4.5 MCG/ACT inhaler Commonly known as:  SYMBICORT Inhale 2 puffs into the lungs 2 (two) times daily.   CENTRUM SILVER ADULT 50+ PO Take 1 tablet by mouth every morning.   ciclopirox 8 % solution Commonly known as:  PENLAC APPLY TO FUNGAL TOENAILS EVERY DAY   CLEAR EYES ALL SEASONS 5-6 MG/ML Soln Generic drug:  Polyvinyl Alcohol-Povidone Apply 1 drop to eye every morning.   D-MANNOSE PO Take 500 mg by mouth daily.   guaiFENesin 600 MG  12 hr tablet Commonly known as:  MUCINEX Take 1 tablet (600 mg total) by mouth 2 (two) times daily. What changed:    when to take this  reasons to take this   insulin aspart 100 UNIT/ML FlexPen Commonly known as:  NOVOLOG Inject 4 Units into the skin 3 (three) times daily with meals. What changed:    how much to take  additional instructions   Insulin Glargine 100 UNIT/ML Solostar Pen Commonly known as:  LANTUS Inject 5 Units into the skin daily. What changed:    how much to take  when to take this   ipratropium-albuterol 0.5-2.5 (3) MG/3ML Soln Commonly known as:  DUONEB Take 3 mLs by nebulization 2 (two) times daily.   lactobacillus acidophilus & bulgar chewable tablet CHEW 1 TABLET BY MOUTH EVERY DAY   levofloxacin 500 MG tablet Commonly known as:  LEVAQUIN Take 1 tablet (500 mg total) by mouth daily for 5 days. Start taking on:  02/13/2018   magnesium oxide 400 (241.3 Mg) MG tablet Commonly known as:  MAG-OX Take 0.5 tablets (200 mg total) by mouth daily. What changed:    how much to take  additional instructions   metFORMIN 500 MG tablet Commonly known as:  GLUCOPHAGE Take 1,000 mg by mouth 2 (two) times daily with a meal.   metoprolol succinate 25 MG 24 hr tablet Commonly known as:  TOPROL-XL Take 0.5 tablets (12.5 mg total) by mouth daily.   mirtazapine 15 MG tablet Commonly known as:  REMERON Take 0.5 tablets (7.5 mg total) by mouth at bedtime. What changed:  how much to take   pantoprazole 40 MG tablet Commonly known as:  PROTONIX Take 40 mg by mouth daily.   POLY-IRON 150 FORTE 150-0.025-1 MG Caps Generic drug:  Iron Polysacch Cmplx-B12-FA TAKE ONE CAPSULE BY MOUTH DAILY   predniSONE 5 MG tablet Commonly known as:  DELTASONE Take 1 tablet (5 mg total) by mouth daily with breakfast.   tamoxifen 20 MG tablet Commonly known as:  NOLVADEX Take 1 tablet (20 mg total) daily by mouth.   Vitamin D3 2000 units capsule Take 2,000 Units by  mouth daily.      Follow-up Information    Moshe Cipro, MD. Schedule an appointment as soon as possible for a visit in 1 week(s).   Specialty:  Internal Medicine Contact information: [address] [place] [address] [phone]          Allergies  Allergen Reactions  . Aspirin Other (See Comments)    Cannot take uncoated due to stomach ulcers  . Megace [Megestrol]     abd pain   . Rocephin [Ceftriaxone Sodium In Dextrose] Swelling   Allergies as of 02/12/2018  Reactions   Aspirin Other (See Comments)   Cannot take uncoated due to stomach ulcers   Megace [megestrol]    abd pain   Rocephin [ceftriaxone Sodium In Dextrose] Swelling      Medication List    STOP taking these medications   amiodarone 200 MG tablet Commonly known as:  PACERONE   nitrofurantoin 50 MG capsule Commonly known as:  MACRODANTIN   raloxifene 60 MG tablet Commonly known as:  EVISTA     TAKE these medications   aspirin EC 81 MG tablet Take 81 mg by mouth 2 (two) times daily.   atorvastatin 10 MG tablet Commonly known as:  LIPITOR Take 10 mg by mouth every evening.   budesonide-formoterol 160-4.5 MCG/ACT inhaler Commonly known as:  SYMBICORT Inhale 2 puffs into the lungs 2 (two) times daily.   CENTRUM SILVER ADULT 50+ PO Take 1 tablet by mouth every morning.   ciclopirox 8 % solution Commonly known as:  PENLAC APPLY TO FUNGAL TOENAILS EVERY DAY   CLEAR EYES ALL SEASONS 5-6 MG/ML Soln Generic drug:  Polyvinyl Alcohol-Povidone Apply 1 drop to eye every morning.   D-MANNOSE PO Take 500 mg by mouth daily.   guaiFENesin 600 MG 12 hr tablet Commonly known as:  MUCINEX Take 1 tablet (600 mg total) by mouth 2 (two) times daily. What changed:    when to take this  reasons to take this   insulin aspart 100 UNIT/ML FlexPen Commonly known as:  NOVOLOG Inject 4 Units into the skin 3 (three) times daily with meals. What changed:    how much to  take  additional instructions   Insulin Glargine 100 UNIT/ML Solostar Pen Commonly known as:  LANTUS Inject 5 Units into the skin daily. What changed:    how much to take  when to take this   ipratropium-albuterol 0.5-2.5 (3) MG/3ML Soln Commonly known as:  DUONEB Take 3 mLs by nebulization 2 (two) times daily.   lactobacillus acidophilus & bulgar chewable tablet CHEW 1 TABLET BY MOUTH EVERY DAY   levofloxacin 500 MG tablet Commonly known as:  LEVAQUIN Take 1 tablet (500 mg total) by mouth daily for 5 days. Start taking on:  02/13/2018   magnesium oxide 400 (241.3 Mg) MG tablet Commonly known as:  MAG-OX Take 0.5 tablets (200 mg total) by mouth daily. What changed:    how much to take  additional instructions   metFORMIN 500 MG tablet Commonly known as:  GLUCOPHAGE Take 1,000 mg by mouth 2 (two) times daily with a meal.   metoprolol succinate 25 MG 24 hr tablet Commonly known as:  TOPROL-XL Take 0.5 tablets (12.5 mg total) by mouth daily.   mirtazapine 15 MG tablet Commonly known as:  REMERON Take 0.5 tablets (7.5 mg total) by mouth at bedtime. What changed:  how much to take   pantoprazole 40 MG tablet Commonly known as:  PROTONIX Take 40 mg by mouth daily.   POLY-IRON 150 FORTE 150-0.025-1 MG Caps Generic drug:  Iron Polysacch Cmplx-B12-FA TAKE ONE CAPSULE BY MOUTH DAILY   predniSONE 5 MG tablet Commonly known as:  DELTASONE Take 1 tablet (5 mg total) by mouth daily with breakfast.   tamoxifen 20 MG tablet Commonly known as:  NOLVADEX Take 1 tablet (20 mg total) daily by mouth.   Vitamin D3 2000 units capsule Take 2,000 Units by mouth daily.       Procedures/Studies: Dg Chest Portable 1 View  Result Date: 02/11/2018 CLINICAL DATA:  Jugular catheter  placement EXAM: PORTABLE CHEST 1 VIEW COMPARISON:  1400 hours FINDINGS: Right jugular central venous catheter has been placed. The tip projects over the cavoatrial junction. Thorax is rotated to the  left. No obvious pneumothorax. Left basilar atelectasis versus airspace disease is stable. IMPRESSION: Right jugular venous catheter with its tip projecting over the cavoatrial junction. No pneumothorax. Electronically Signed   By: Marybelle Killings M.D.   On: 02/11/2018 17:13   Dg Abdomen Acute W/chest  Result Date: 02/11/2018 CLINICAL DATA:  Cough and decreased p.o. intake EXAM: DG ABDOMEN ACUTE W/ 1V CHEST COMPARISON:  Chest radiograph 01/05/2018 FINDINGS: Examination is degraded by difficulties with patient positioning. There is elevation of the left hemidiaphragm. The right lung is clear. The midportion of the left lung is clear, but the visualization of the rest of the left lung is very limited. There is no free intraperitoneal air. There is a large amount of stool throughout the colon, predominantly on the left side. Within the rectum is a large stool ball with surrounding gas. IMPRESSION: 1. Limited assessment of the chest due to difficulties with patient positioning but no evidence of acute cardiopulmonary disease. 2. Large amount of stool within the distal colon, including large rectal stool ball suggesting fecal impaction. Electronically Signed   By: Ulyses Jarred M.D.   On: 02/11/2018 15:23     Subjective: Pt is awake, alert, and eating and drinking.    Discharge Exam: Vitals:   02/12/18 0544 02/12/18 0741  BP: (!) 139/57   Pulse: 83   Resp: 12   Temp: 98.6 F (37 C)   SpO2: 98% 98%   Vitals:   02/11/18 2004 02/11/18 2132 02/12/18 0544 02/12/18 0741  BP:  (!) 151/56 (!) 139/57   Pulse:  72 83   Resp:  15 12   Temp:  98.7 F (37.1 C) 98.6 F (37 C)   TempSrc:  Oral Oral   SpO2: 98% 98% 98% 98%  Weight:      Height:       General: Pt is alert, awake, not in acute distress. She elderly and frail. MMM. Cardiovascular: normal S1/S2 +, no rubs, no gallops Respiratory: CTA bilaterally, no wheezing, no rhonchi Abdominal: Soft, NT, ND, bowel sounds + Extremities: no edema, no  cyanosis   The results of significant diagnostics from this hospitalization (including imaging, microbiology, ancillary and laboratory) are listed below for reference.    Microbiology: Recent Results (from the past 240 hour(s))  Blood Culture (routine x 2)     Status: None (Preliminary result)   Collection Time: 02/11/18  3:52 PM  Result Value Ref Range Status   Specimen Description BLOOD RIGHT HAND  Final   Special Requests   Final    BOTTLES DRAWN AEROBIC ONLY Blood Culture adequate volume Performed at [facility], [address] [place], [place] [address]    Culture PENDING  Incomplete   Report Status PENDING  Incomplete  Blood Culture (routine x 2)     Status: None (Preliminary result)   Collection Time: 02/11/18  3:52 PM  Result Value Ref Range Status   Specimen Description BLOOD RIGHT HAND  Final   Special Requests   Final    BOTTLES DRAWN AEROBIC ONLY Blood Culture adequate volume Performed at [facility], [address] [place], [place] [address]    Culture PENDING  Incomplete   Report Status PENDING  Incomplete     Labs: BNP (last 3 results) No results for input(s): BNP in the last 8760  hours. Basic Metabolic Panel: Recent Labs  Lab 02/11/18 1310 02/12/18 0538  NA 137 137  K 5.2* 3.7  CL 96* 105  CO2 31 27  GLUCOSE 317* 160*  BUN 29* 18  CREATININE 1.29* 0.84  CALCIUM 9.5 7.5*   Liver Function Tests: Recent Labs  Lab 02/11/18 1310  AST 24  ALT 18  ALKPHOS 56  BILITOT 0.5  PROT 7.4  ALBUMIN 3.1*   Recent Labs  Lab 02/11/18 1310  LIPASE 33   No results for input(s): AMMONIA in the last 168 hours. CBC: Recent Labs  Lab 02/11/18 1310 02/12/18 0538  WBC 11.2* 8.5  NEUTROABS 8.4*  --   HGB 11.9* 8.1*  HCT 39.6 26.9*  MCV 93.6 93.1  PLT 270 227   Cardiac Enzymes: Recent Labs  Lab 02/11/18 1310  TROPONINI <0.03   BNP: Invalid input(s): POCBNP CBG: Recent Labs  Lab 02/11/18 1309 02/11/18 2059 02/12/18 0731  GLUCAP 256* 199*  152*   D-Dimer No results for input(s): DDIMER in the last 72 hours. Hgb A1c No results for input(s): HGBA1C in the last 72 hours. Lipid Profile No results for input(s): CHOL, HDL, LDLCALC, TRIG, CHOLHDL, LDLDIRECT in the last 72 hours. Thyroid function studies No results for input(s): TSH, T4TOTAL, T3FREE, THYROIDAB in the last 72 hours.  Invalid input(s): FREET3 Anemia work up No results for input(s): VITAMINB12, FOLATE, FERRITIN, TIBC, IRON, RETICCTPCT in the last 72 hours. Urinalysis    Component Value Date/Time   COLORURINE YELLOW 02/11/2018 1420   APPEARANCEUR CLEAR 02/11/2018 1420   LABSPEC 1.015 02/11/2018 1420   PHURINE 7.0 02/11/2018 1420   GLUCOSEU >=500 (A) 02/11/2018 1420   HGBUR MODERATE (A) 02/11/2018 1420   BILIRUBINUR NEGATIVE 02/11/2018 1420   KETONESUR NEGATIVE 02/11/2018 1420   PROTEINUR 30 (A) 02/11/2018 1420   UROBILINOGEN 0.2 06/30/2015 1317   NITRITE NEGATIVE 02/11/2018 1420   LEUKOCYTESUR MODERATE (A) 02/11/2018 1420   Sepsis Labs Invalid input(s): PROCALCITONIN,  WBC,  LACTICIDVEN Microbiology Recent Results (from the past 240 hour(s))  Blood Culture (routine x 2)     Status: None (Preliminary result)   Collection Time: 02/11/18  3:52 PM  Result Value Ref Range Status   Specimen Description BLOOD RIGHT HAND  Final   Special Requests   Final    BOTTLES DRAWN AEROBIC ONLY Blood Culture adequate volume Performed at [facility], [address] [place], [place] [address]    Culture PENDING  Incomplete   Report Status PENDING  Incomplete  Blood Culture (routine x 2)     Status: None (Preliminary result)   Collection Time: 02/11/18  3:52 PM  Result Value Ref Range Status   Specimen Description BLOOD RIGHT HAND  Final   Special Requests   Final    BOTTLES DRAWN AEROBIC ONLY Blood Culture adequate volume Performed at [facility], [address] [place], [place] [address]    Culture PENDING  Incomplete   Report Status PENDING  Incomplete   Time  coordinating discharge:    SIGNED:  Irwin Brakeman, MD  [facility] 02/12/2018, 9:54 AM Pager [phone]  If 7PM-7AM, please contact night-coverage [email or web address] Password [email or web address]

## 2018-02-12 NOTE — Progress Notes (Signed)
Patient is to be discharged home and in stable condition. Patient's IJ removed, WNL. Patient's family given discharge instructions and verbalized understanding. Patient will be escorted out by staff via wheelchair.  Celestia Khat, RN

## 2018-02-13 LAB — BLOOD CULTURE ID PANEL (REFLEXED)
Acinetobacter baumannii: NOT DETECTED
CANDIDA GLABRATA: NOT DETECTED
CANDIDA KRUSEI: NOT DETECTED
Candida albicans: NOT DETECTED
Candida parapsilosis: NOT DETECTED
Candida tropicalis: NOT DETECTED
ENTEROCOCCUS SPECIES: NOT DETECTED
Enterobacter cloacae complex: NOT DETECTED
Enterobacteriaceae species: NOT DETECTED
Escherichia coli: NOT DETECTED
HAEMOPHILUS INFLUENZAE: NOT DETECTED
Klebsiella oxytoca: NOT DETECTED
Klebsiella pneumoniae: NOT DETECTED
Listeria monocytogenes: NOT DETECTED
NEISSERIA MENINGITIDIS: NOT DETECTED
PSEUDOMONAS AERUGINOSA: NOT DETECTED
Proteus species: NOT DETECTED
SERRATIA MARCESCENS: NOT DETECTED
STAPHYLOCOCCUS AUREUS BCID: NOT DETECTED
STAPHYLOCOCCUS SPECIES: NOT DETECTED
STREPTOCOCCUS AGALACTIAE: NOT DETECTED
STREPTOCOCCUS PNEUMONIAE: NOT DETECTED
Streptococcus pyogenes: NOT DETECTED
Streptococcus species: NOT DETECTED

## 2018-02-14 LAB — URINE CULTURE
Culture: >=100000 — AB
SPECIAL REQUESTS: NORMAL

## 2018-02-14 LAB — CULTURE, BLOOD (ROUTINE X 2): SPECIAL REQUESTS: ADEQUATE

## 2018-02-16 LAB — CULTURE, BLOOD (ROUTINE X 2)
Culture: NO GROWTH
Special Requests: ADEQUATE

## 2018-02-26 ENCOUNTER — Inpatient Hospital Stay (HOSPITAL_BASED_OUTPATIENT_CLINIC_OR_DEPARTMENT_OTHER): Payer: Medicare Other | Admitting: Internal Medicine

## 2018-02-26 ENCOUNTER — Inpatient Hospital Stay (HOSPITAL_COMMUNITY): Payer: Medicare Other | Attending: Hematology

## 2018-02-26 ENCOUNTER — Encounter (HOSPITAL_COMMUNITY): Payer: Self-pay | Admitting: Internal Medicine

## 2018-02-26 VITALS — BP 103/44 | HR 85 | Temp 98.4°F | Resp 12

## 2018-02-26 DIAGNOSIS — Z7982 Long term (current) use of aspirin: Secondary | ICD-10-CM | POA: Insufficient documentation

## 2018-02-26 DIAGNOSIS — C50419 Malignant neoplasm of upper-outer quadrant of unspecified female breast: Secondary | ICD-10-CM

## 2018-02-26 DIAGNOSIS — K921 Melena: Secondary | ICD-10-CM | POA: Diagnosis not present

## 2018-02-26 DIAGNOSIS — Z794 Long term (current) use of insulin: Secondary | ICD-10-CM | POA: Insufficient documentation

## 2018-02-26 DIAGNOSIS — I10 Essential (primary) hypertension: Secondary | ICD-10-CM | POA: Diagnosis not present

## 2018-02-26 DIAGNOSIS — Z171 Estrogen receptor negative status [ER-]: Secondary | ICD-10-CM | POA: Insufficient documentation

## 2018-02-26 DIAGNOSIS — Z9981 Dependence on supplemental oxygen: Secondary | ICD-10-CM | POA: Insufficient documentation

## 2018-02-26 DIAGNOSIS — Z9281 Personal history of extracorporeal membrane oxygenation (ECMO): Secondary | ICD-10-CM | POA: Diagnosis not present

## 2018-02-26 DIAGNOSIS — Z79810 Long term (current) use of selective estrogen receptor modulators (SERMs): Secondary | ICD-10-CM | POA: Insufficient documentation

## 2018-02-26 DIAGNOSIS — Z79899 Other long term (current) drug therapy: Secondary | ICD-10-CM | POA: Diagnosis not present

## 2018-02-26 DIAGNOSIS — Z7984 Long term (current) use of oral hypoglycemic drugs: Secondary | ICD-10-CM | POA: Diagnosis not present

## 2018-02-26 DIAGNOSIS — C50412 Malignant neoplasm of upper-outer quadrant of left female breast: Secondary | ICD-10-CM | POA: Diagnosis present

## 2018-02-26 DIAGNOSIS — E119 Type 2 diabetes mellitus without complications: Secondary | ICD-10-CM | POA: Insufficient documentation

## 2018-02-26 LAB — CBC WITH DIFFERENTIAL/PLATELET
BASOS ABS: 0 10*3/uL (ref 0.0–0.1)
Basophils Relative: 0 %
EOS PCT: 0 %
Eosinophils Absolute: 0.1 10*3/uL (ref 0.0–0.7)
HCT: 32.2 % — ABNORMAL LOW (ref 36.0–46.0)
HEMOGLOBIN: 9.7 g/dL — AB (ref 12.0–15.0)
LYMPHS ABS: 2.2 10*3/uL (ref 0.7–4.0)
LYMPHS PCT: 12 %
MCH: 27.9 pg (ref 26.0–34.0)
MCHC: 30.1 g/dL (ref 30.0–36.0)
MCV: 92.5 fL (ref 78.0–100.0)
Monocytes Absolute: 0.7 10*3/uL (ref 0.1–1.0)
Monocytes Relative: 4 %
NEUTROS PCT: 84 %
Neutro Abs: 15.3 10*3/uL — ABNORMAL HIGH (ref 1.7–7.7)
PLATELETS: 417 10*3/uL — AB (ref 150–400)
RBC: 3.48 MIL/uL — ABNORMAL LOW (ref 3.87–5.11)
RDW: 14.5 % (ref 11.5–15.5)
WBC: 18.3 10*3/uL — AB (ref 4.0–10.5)

## 2018-02-26 LAB — COMPREHENSIVE METABOLIC PANEL
ALK PHOS: 56 U/L (ref 38–126)
ALT: 10 U/L — AB (ref 14–54)
AST: 21 U/L (ref 15–41)
Albumin: 2.6 g/dL — ABNORMAL LOW (ref 3.5–5.0)
Anion gap: 9 (ref 5–15)
BUN: 20 mg/dL (ref 6–20)
CALCIUM: 8.9 mg/dL (ref 8.9–10.3)
CHLORIDE: 96 mmol/L — AB (ref 101–111)
CO2: 30 mmol/L (ref 22–32)
CREATININE: 1.03 mg/dL — AB (ref 0.44–1.00)
GFR calc Af Amer: 54 mL/min — ABNORMAL LOW (ref >60)
GFR, EST NON AFRICAN AMERICAN: 47 mL/min — AB (ref >60)
Glucose, Bld: 200 mg/dL — ABNORMAL HIGH (ref 65–99)
Potassium: 4.5 mmol/L (ref 3.5–5.1)
SODIUM: 135 mmol/L (ref 135–145)
Total Bilirubin: 0.4 mg/dL (ref 0.3–1.2)
Total Protein: 6.7 g/dL (ref 6.5–8.1)

## 2018-02-26 NOTE — Progress Notes (Signed)
Diagnosis Malignant neoplasm of upper-outer quadrant of female breast, unspecified estrogen receptor status, unspecified laterality ([place]) - Plan: CBC with Differential/Platelet, Comprehensive metabolic panel, Lactate dehydrogenase  Staging Cancer Staging Breast cancer of upper-outer quadrant of left female breast [facility]) Staging form: Breast, AJCC 7th Edition - Clinical stage from 06/27/2015: Stage IIA (T2, N0, M0) - Signed by Baird Cancer, PA-C on 06/27/2015   Assessment and Plan: 1. T2, N0 left breast cancer.   ER- PR+ HER 2 neu -.  Pt has undergone bilateral mastectomy.  She was previously followed by Dr.  Talbert Cage.  Pt has been on Tamoxifen 20 mg daily since 05/22/2015.  Review of pathology from surgery done 05/09/2015 showed tumor was ER negative, PR + but only 1% and her 2 negative.  She has decreased PS due to kyphosis.  Pt is tolerating Tamoxifen.  Due to endocrine panel being minimally + at 1%, it would be reasonable to discontinue Tamoxifen therapy especially due to her having undergone bilateral mastectomy.  Pt is currently planned for 5 yrs of therapy that would complete in 2021.  Family members given option of discontinuation of therapy and clinic will contact them for decision.  She will be given a follow-up in 1 year or prn follow-up pending their decision.  Pt has reportedly previously had hysterectomy.  All questions answered and they expressed understanding of information.    2.  Leucocytosis.  WBC is elevated at 18.3, HB 9.7 and plts 417,000.  Chemstries WNL.  I have discussed with family I suspect WBC is elevated due to infection.  They indicate pt has been started on abx by PCP.  She has a wound at her upper back and they also report a sore on her bottom as well as chronic UTIs.  I have discussed with them to follow-up with PCP as there is a concern for infection.  She is afebrile in clinic.  She will have repeat labs in 1 year or on follow-up with PCP.    3.  Kyphosis.  Pt is in  wheelchair.    4.  HTN.  BP is 103/44.  Continue to follow-up with PCP.    5.  Back wound.  This is covered with a bandage.  Pt is reportedly on abx.  I have discussed with family that pt will need to follow-up with PCP for ongoing management of wound.  WBC is elevated today at 18.3, but pt is afebrile and lab findings could indicate infection.    6.  Blood in stool.  Family reports this is due to sore on her bottom.  Labs done today 02/26/2018 show HB 9.7.  Follow-up with PCP.     Interval history:  84 y.o. female is here for follow-up of L breast cancer s/p bilateral mastectomy, she has ER-, PR+ disease. She is currently on Tamoxifen.   She has had a hysterectomy. She is confined to a wheelchair due to her significant kyphosis.  Current status:  Pt is seen today for follow-up.  She is accompanied by two family members.  She is in wheelchair.  When questioned, family indicates pt is on abx and has a wound on upper back.  They also report some blood in stool that they supect is coming from a sore.  She also has frequent UTIs.    Path from Breast, simple mastectomy, Left done 05/09/2015:   - INVASIVE GRADE III DUCTAL CARCINOMA, SPANNING 4.8 CM IN GREATEST DIMENSION. - MARGINS ARE NEGATIVE. - SEE ONCOLOGY TEMPLATE. 2. Lymph  nodes, regional resection, Left axillary contents - FIVE BENIGN LYMPH NODES WITH NO TUMOR SEEN (0/5).   BREAST, INVASIVE TUMOR, WITH LYMPH NODES PRESENT Specimen, including laterality and lymph node sampling (sentinel, non-sentinel): Left breast with left axillary contents. Procedure: Left mastectomy with left axillary excision. Histologic type: Invasive ductal carcinoma. Grade: 3. Tubule formation: 3. Nuclear pleomorphism: 2. Mitotic: 3. Tumor size (gross measurement): 4.8 cm. Margins: Invasive, distance to closest margin: 0.2 cm (deep margin). Lymphovascular invasion: Definitive lymph/vascular invasion is not identified on the current specimen;  however, lymph/vascular invasion was reported on the previous needle core biopsy ([id number]). Ductal carcinoma in situ: Not identified. Lobular neoplasia: Not identified. Tumor focality: Unifocal. Treatment effect: Not applicable. Extent of tumor: Skin: Tumor directly extends into dermis. Nipple: Not involved. Skeletal muscle: Not received. Lymph nodes: Examined: 0 Sentinel. 5 Non-sentinel. 5 Total. Lymph nodes with metastasis: 0. Isolated tumor cells (< 0.2 mm): 0. 2 of 4 FINAL for [facility], [place] ([id number]) Microscopic Comment(continued) Micrometastasis: (> 0.2 mm and < 2.0 mm): 0. Macrometastasis: (> 2.0 mm): 0. Extracapsular extension: Not applicable. Breast prognostic profile: Performed on previous case [id number] Estrogen receptor: 0%, negative. Progesterone receptor: 1%, positive. Her 2 neu: 1.54 ratio, negative. Ki-67: 30%. Non-neoplastic breast: Unremarkable. TNM: pT2, pN0. Comments: As both Her-2 neu and estrogen receptor were previously reported as negative, a quantitative estrogen receptor and a Her-2 neu will be repeated on representative tumor to be reported in an addendum to follow. (RH:kh 05-11-15) Willeen Niece MD Pathologist, Electronic Signature (Case signed 05/11/2015) Specimen Gross and Clinical Information Breast prognostic profile: Performed on previous case [id number] Estrogen receptor: 0%, negative. Progesterone receptor: 1%, positive. Her 2 neu: 1.54 ratio, negative. Ki-67: 30%. Non-neoplastic breast: Unremarkable. TNM: pT2, pN0. Comments: As both Her-2 neu and estrogen receptor were previously reported as negative, a quantitative estrogen receptor and a Her-2 neu will be repeated on representative tumor to be reported in an addendum to follow. (RH:kh 05-11-15) Willeen Niece MD Pathologist, Electronic Signature (Case signed 05/11/2015)      Breast cancer of upper-outer quadrant of left female breast ([place])   03/20/2015 Initial  Diagnosis    Breast, left, needle core biopsy, upper outer quadrant - INVASIVE DUCTAL CARCINOMA. - LYMPHOVASCULAR INVASION IS IDENTIFIED.      05/09/2015 Definitive Surgery    Diagnosis 1. Breast, simple mastectomy, Left - INVASIVE GRADE III DUCTAL CARCINOMA, SPANNING 4.8 CM IN GREATEST DIMENSION. - MARGINS ARE NEGATIVE. - SEE ONCOLOGY TEMPLATE. 2. Lymph nodes, regional resection, Left axillary contents - FIVE BENIGN LYMP      05/22/2015 -  Anti-estrogen oral therapy    Tamoxifen 20 mg daily      07/19/2016 - 07/23/2016 Hospital Admission    Admit date: 07/19/2016 Admission diagnosis: Sepsis Additional comments: Sepsis due to UTI        Problem List Patient Active Problem List   Diagnosis Date Noted  . SVT (supraventricular tachycardia) ([place]) [I47.1] 01/06/2018  . Sepsis secondary to UTI ([place]) [A41.9, N39.0] 01/05/2018  . Hypernatremia [E87.0] 01/05/2018  . Decubitus ulcer of thoracic spine area, stage III ([place]) [L89.93] 01/05/2018  . Decubitus ulcer of sacral region, stage 2 [L89.152] 01/05/2018  . Anemia [D64.9] 01/05/2018  . HCAP (healthcare-associated pneumonia) [J18.9] 03/06/2017  . Hyperglycemia [R73.9] 03/03/2017  . Electrolyte abnormality [E87.8] 03/03/2017  . Pressure injury of skin [L89.90] 01/16/2017  . Dehydration [E86.0]   . Lactic acidosis [E87.2] 07/19/2016  . Asthma, chronic [J45.909] 04/12/2016  . Sepsis ([place]) [A41.9] 03/08/2016  . CAP (community acquired pneumonia) [  J18.9] 10/09/2015  . Pressure ulcer [L89.90] 10/09/2015  . Kyphosis [M40.209]   . UTI (urinary tract infection) [N39.0] 06/30/2015  . Breast cancer of upper-outer quadrant of left female breast ([place]) [C50.412] 05/09/2015  . Rheumatoid arthritis ([place]) [M06.9] 10/29/2012  . HTN (hypertension) [I10] 10/29/2012  . Diabetes type 2, controlled ([place]) [E11.9] 10/29/2012  . GERD (gastroesophageal reflux disease) [K21.9] 10/29/2012    Past Medical History Past Medical History:  Diagnosis Date  .  Acid reflux   . Anemia   . Arthritis    ra  . Asthma   . Cancer [facility])    breast cancer  . Coronary artery disease   . Diabetes mellitus without complication ([place])   . Diverticulitis   . Gastric ulcer   . Hyperlipidemia   . Hypertension   . Hypoxemia   . Kyphosis   . Kyphosis   . Mitral valve disorder   . Osteoporosis   . Oxygen dependent    2 liters at night  . Raynaud disease   . Thrombocytopenia [facility])     Past Surgical History Past Surgical History:  Procedure Laterality Date  . BREAST SURGERY    . HERNIA REPAIR    . MASTECTOMY Right 1978  . SIMPLE MASTECTOMY WITH AXILLARY SENTINEL NODE BIOPSY Left 05/09/2015   Procedure: LEFT SIMPLE MASTECTOMY;  Surgeon: Erroll Luna, MD;  Location: [facility];  Service: General;  Laterality: Left;    Family History Family History  Problem Relation Age of Onset  . Asthma Sister   . Rheum arthritis Daughter      Social History  reports that she has never smoked. She has never used smokeless tobacco. She reports that she does not drink alcohol or use drugs.  Medications  Current Outpatient Medications:  .  aspirin EC 81 MG tablet, Take 81 mg by mouth 2 (two) times daily. , Disp: , Rfl:  .  atorvastatin (LIPITOR) 10 MG tablet, Take 10 mg by mouth every evening. , Disp: , Rfl:  .  budesonide-formoterol (SYMBICORT) 160-4.5 MCG/ACT inhaler, Inhale 2 puffs into the lungs 2 (two) times daily., Disp: , Rfl:  .  Cholecalciferol (VITAMIN D3) 2000 units capsule, Take 2,000 Units by mouth daily. , Disp: , Rfl:  .  ciclopirox (PENLAC) 8 % solution, APPLY TO FUNGAL TOENAILS EVERY DAY, Disp: , Rfl: 1 .  D-MANNOSE PO, Take 500 mg by mouth daily. , Disp: , Rfl:  .  guaiFENesin (MUCINEX) 600 MG 12 hr tablet, Take 1 tablet (600 mg total) by mouth 2 (two) times daily. (Patient taking differently: Take 600 mg by mouth 2 (two) times daily as needed for cough or to loosen phlegm. ), Disp: 30 tablet, Rfl: 0 .  insulin aspart (NOVOLOG) 100 UNIT/ML FlexPen,  Inject 4 Units into the skin 3 (three) times daily with meals. (Patient taking differently: Inject 1-10 Units into the skin 3 (three) times daily with meals. Per sliding scale. For levels over 350- 10 units is administered), Disp: 15 mL, Rfl: 11 .  Insulin Glargine (LANTUS) 100 UNIT/ML Solostar Pen, Inject 5 Units into the skin daily. (Patient taking differently: Inject 3 Units into the skin at bedtime. ), Disp: 15 mL, Rfl: 11 .  ipratropium-albuterol (DUONEB) 0.5-2.5 (3) MG/3ML SOLN, Take 3 mLs by nebulization 2 (two) times daily., Disp: 360 mL, Rfl:  .  lactobacillus acidophilus & bulgar (LACTINEX) chewable tablet, CHEW 1 TABLET BY MOUTH EVERY DAY, Disp: , Rfl: 6 .  magnesium oxide (MAG-OX) 400 (241.3 Mg) MG tablet,  Take 0.5 tablets (200 mg total) by mouth daily. (Patient taking differently: Take 200-400 mg by mouth daily. Alternating 200 mg one day then 400 mg the next.), Disp: 30 tablet, Rfl: 1 .  metFORMIN (GLUCOPHAGE) 500 MG tablet, Take 1,000 mg by mouth 2 (two) times daily with a meal., Disp: , Rfl:  .  metoprolol succinate (TOPROL-XL) 25 MG 24 hr tablet, Take 0.5 tablets (12.5 mg total) by mouth daily., Disp: , Rfl: 6 .  mirtazapine (REMERON) 15 MG tablet, Take 0.5 tablets (7.5 mg total) by mouth at bedtime., Disp: , Rfl: 0 .  Multiple Vitamins-Minerals (CENTRUM SILVER ADULT 50+ PO), Take 1 tablet by mouth every morning. , Disp: , Rfl:  .  pantoprazole (PROTONIX) 40 MG tablet, Take 40 mg by mouth daily., Disp: , Rfl:  .  POLY-IRON 150 FORTE 150-25-1 MG-MCG-MG CAPS, TAKE ONE CAPSULE BY MOUTH DAILY, Disp: 30 each, Rfl: 5 .  Polyvinyl Alcohol-Povidone (CLEAR EYES ALL SEASONS) 5-6 MG/ML SOLN, Apply 1 drop to eye every morning. , Disp: , Rfl:  .  predniSONE (DELTASONE) 5 MG tablet, Take 1 tablet (5 mg total) by mouth daily with breakfast., Disp: , Rfl:  .  tamoxifen (NOLVADEX) 20 MG tablet, Take 1 tablet (20 mg total) by mouth daily., Disp: 90 tablet, Rfl: 1  Allergies Aspirin; Megace  [megestrol]; and Rocephin [ceftriaxone sodium in dextrose]  Review of Systems Review of Systems - Oncology ROS as per HPI otherwise 12 point ROS is negative.   Physical Exam  Vitals Wt Readings from Last 3 Encounters:  02/11/18 115 lb (52.2 kg)  01/08/18 115 lb 11.9 oz (52.5 kg)  10/07/17 130 lb (59 kg)   Temp Readings from Last 3 Encounters:  02/12/18 98.6 F (37 C) (Oral)  01/11/18 (!) 97.5 F (36.4 C) (Oral)  10/07/17 99.3 F (37.4 C) (Oral)   BP Readings from Last 3 Encounters:  02/12/18 (!) 139/57  01/11/18 (!) 129/59  10/07/17 96/64   Pulse Readings from Last 3 Encounters:  02/12/18 83  01/11/18 80  10/07/17 88   Constitutional: Well-developed, well-nourished, and in no distress.   HENT: Head: Normocephalic and atraumatic.  Mouth/Throat: No oropharyngeal exudate. Mucosa moist. Eyes: Pupils are equal, round, and reactive to light. Conjunctivae are normal. No scleral icterus.  Neck: Normal range of motion. Neck supple. No JVD present.  Cardiovascular: Normal rate, regular rhythm and normal heart sounds.  Exam reveals no gallop and no friction rub.   No murmur heard. Pulmonary/Chest: Effort normal and breath sounds normal. No respiratory distress. No wheezes.No rales.  Abdominal: Soft. Bowel sounds are normal. No distension. There is no tenderness. There is no guarding.  Musculoskeletal: Pt with extreme kyphosis.   Lymphadenopathy: No cervical, axillary or supraclavicular adenopathy.  Neurological: Alert and oriented to person, place, and time. No cranial nerve deficit.  Skin: Skin is warm and dry. On her upper back she has a bandage covering a wound area.   Psychiatric: Affect and judgment normal.  Bilateral breast exam:  Chaperone present.  Bilateral mastectomy.  No evidence of chest wall recurrence bilaterally.    Labs Appointment on 02/26/2018  Component Date Value Ref Range Status  . WBC 02/26/2018 18.3* 4.0 - 10.5 K/uL Final  . RBC 02/26/2018 3.48* 3.87 -  5.11 MIL/uL Final  . Hemoglobin 02/26/2018 9.7* 12.0 - 15.0 g/dL Final  . HCT 02/26/2018 32.2* 36.0 - 46.0 % Final  . MCV 02/26/2018 92.5  78.0 - 100.0 fL Final  . [facility] 02/26/2018 27.9  26.0 - 34.0 pg Final  . MCHC 02/26/2018 30.1  30.0 - 36.0 g/dL Final  . RDW 02/26/2018 14.5  11.5 - 15.5 % Final  . Platelets 02/26/2018 417* 150 - 400 K/uL Final  . Neutrophils Relative % 02/26/2018 84  % Final  . Neutro Abs 02/26/2018 15.3* 1.7 - 7.7 K/uL Final  . Lymphocytes Relative 02/26/2018 12  % Final  . Lymphs Abs 02/26/2018 2.2  0.7 - 4.0 K/uL Final  . Monocytes Relative 02/26/2018 4  % Final  . Monocytes Absolute 02/26/2018 0.7  0.1 - 1.0 K/uL Final  . Eosinophils Relative 02/26/2018 0  % Final  . Eosinophils Absolute 02/26/2018 0.1  0.0 - 0.7 K/uL Final  . Basophils Relative 02/26/2018 0  % Final  . Basophils Absolute 02/26/2018 0.0  0.0 - 0.1 K/uL Final   Performed at [facility], [address], [place], [place] [address]  . Sodium 02/26/2018 135  135 - 145 mmol/L Final  . Potassium 02/26/2018 4.5  3.5 - 5.1 mmol/L Final  . Chloride 02/26/2018 96* 101 - 111 mmol/L Final  . CO2 02/26/2018 30  22 - 32 mmol/L Final  . Glucose, Bld 02/26/2018 200* 65 - 99 mg/dL Final  . BUN 02/26/2018 20  6 - 20 mg/dL Final  . Creatinine, Ser 02/26/2018 1.03* 0.44 - 1.00 mg/dL Final  . Calcium 02/26/2018 8.9  8.9 - 10.3 mg/dL Final  . Total Protein 02/26/2018 6.7  6.5 - 8.1 g/dL Final  . Albumin 02/26/2018 2.6* 3.5 - 5.0 g/dL Final  . AST 02/26/2018 21  15 - 41 U/L Final  . ALT 02/26/2018 10* 14 - 54 U/L Final  . Alkaline Phosphatase 02/26/2018 56  38 - 126 U/L Final  . Total Bilirubin 02/26/2018 0.4  0.3 - 1.2 mg/dL Final  . GFR calc non Af Amer 02/26/2018 47* >60 mL/min Final  . GFR calc Af Amer 02/26/2018 54* >60 mL/min Final   Comment: (NOTE) The eGFR has been calculated using the CKD EPI equation. This calculation has not been validated in all clinical situations. eGFR's persistently <60 mL/min  signify possible Chronic Kidney Disease.   Georgiann Hahn gap 02/26/2018 9  5 - 15 Final   Performed at [facility], [address] [place], [place] [address]     Pathology Orders Placed This Encounter  Procedures  . CBC with Differential/Platelet    Standing Status:   Future    Standing Expiration Date:   02/27/2020  . Comprehensive metabolic panel    Standing Status:   Future    Standing Expiration Date:   02/27/2020  . Lactate dehydrogenase    Standing Status:   Future    Standing Expiration Date:   02/27/2020       Zoila Shutter MD

## 2018-03-02 ENCOUNTER — Telehealth (HOSPITAL_COMMUNITY): Payer: Self-pay

## 2018-03-02 NOTE — Telephone Encounter (Signed)
Dr. Walden Field requested I call patient's daughters and let them know that patient does not need to be on tamoxifen. After reviewing chart, Dr. Walden Field discovered patient was ER- and PR was barely + and because of this she does not need to take the tamoxifen. Also patient does not need to follow up here at the cancer center anymore. Patients PCP can monitor her from now on. Spoke with sitter at the home number who gave me Joyce's number. I called and left Blanch Media a message with the above information and requested she call me to let me know she got the message and understood everything. Message sent to scheduling to cancel patients follow up appts.

## 2018-03-03 NOTE — Telephone Encounter (Signed)
Patients daughter, Blanch Media, called back to let me know she got the message yesterday and did understand everything. She expressed thanks for the care given to her mother.

## 2018-03-07 ENCOUNTER — Inpatient Hospital Stay (HOSPITAL_COMMUNITY)
Admission: EM | Admit: 2018-03-07 | Discharge: 2018-03-11 | DRG: 177 | Disposition: A | Discharge status: Home Health | Payer: Medicare Other | Attending: Internal Medicine | Admitting: Internal Medicine

## 2018-03-07 ENCOUNTER — Encounter (HOSPITAL_COMMUNITY): Payer: Self-pay | Admitting: *Deleted

## 2018-03-07 ENCOUNTER — Other Ambulatory Visit: Payer: Self-pay

## 2018-03-07 ENCOUNTER — Emergency Department (HOSPITAL_COMMUNITY): Payer: Medicare Other

## 2018-03-07 DIAGNOSIS — Z9981 Dependence on supplemental oxygen: Secondary | ICD-10-CM | POA: Diagnosis not present

## 2018-03-07 DIAGNOSIS — I2510 Atherosclerotic heart disease of native coronary artery without angina pectoris: Secondary | ICD-10-CM | POA: Diagnosis present

## 2018-03-07 DIAGNOSIS — Z7982 Long term (current) use of aspirin: Secondary | ICD-10-CM | POA: Diagnosis not present

## 2018-03-07 DIAGNOSIS — R739 Hyperglycemia, unspecified: Secondary | ICD-10-CM | POA: Diagnosis not present

## 2018-03-07 DIAGNOSIS — Z7952 Long term (current) use of systemic steroids: Secondary | ICD-10-CM | POA: Diagnosis not present

## 2018-03-07 DIAGNOSIS — M4150 Other secondary scoliosis, site unspecified: Secondary | ICD-10-CM | POA: Diagnosis present

## 2018-03-07 DIAGNOSIS — N3000 Acute cystitis without hematuria: Secondary | ICD-10-CM | POA: Diagnosis not present

## 2018-03-07 DIAGNOSIS — I10 Essential (primary) hypertension: Secondary | ICD-10-CM | POA: Diagnosis present

## 2018-03-07 DIAGNOSIS — J690 Pneumonitis due to inhalation of food and vomit: Secondary | ICD-10-CM | POA: Diagnosis present

## 2018-03-07 DIAGNOSIS — M069 Rheumatoid arthritis, unspecified: Secondary | ICD-10-CM | POA: Diagnosis present

## 2018-03-07 DIAGNOSIS — M05722 Rheumatoid arthritis with rheumatoid factor of left elbow without organ or systems involvement: Secondary | ICD-10-CM

## 2018-03-07 DIAGNOSIS — Z681 Body mass index (BMI) 19 or less, adult: Secondary | ICD-10-CM | POA: Diagnosis not present

## 2018-03-07 DIAGNOSIS — Z7401 Bed confinement status: Secondary | ICD-10-CM | POA: Diagnosis not present

## 2018-03-07 DIAGNOSIS — L89159 Pressure ulcer of sacral region, unspecified stage: Secondary | ICD-10-CM | POA: Diagnosis not present

## 2018-03-07 DIAGNOSIS — E860 Dehydration: Secondary | ICD-10-CM | POA: Diagnosis present

## 2018-03-07 DIAGNOSIS — L89152 Pressure ulcer of sacral region, stage 2: Secondary | ICD-10-CM | POA: Diagnosis present

## 2018-03-07 DIAGNOSIS — I059 Rheumatic mitral valve disease, unspecified: Secondary | ICD-10-CM | POA: Diagnosis present

## 2018-03-07 DIAGNOSIS — Z9011 Acquired absence of right breast and nipple: Secondary | ICD-10-CM

## 2018-03-07 DIAGNOSIS — Z7951 Long term (current) use of inhaled steroids: Secondary | ICD-10-CM

## 2018-03-07 DIAGNOSIS — N179 Acute kidney failure, unspecified: Secondary | ICD-10-CM | POA: Diagnosis present

## 2018-03-07 DIAGNOSIS — Z794 Long term (current) use of insulin: Secondary | ICD-10-CM

## 2018-03-07 DIAGNOSIS — Z853 Personal history of malignant neoplasm of breast: Secondary | ICD-10-CM | POA: Diagnosis not present

## 2018-03-07 DIAGNOSIS — J189 Pneumonia, unspecified organism: Secondary | ICD-10-CM | POA: Diagnosis not present

## 2018-03-07 DIAGNOSIS — B9620 Unspecified Escherichia coli [E. coli] as the cause of diseases classified elsewhere: Secondary | ICD-10-CM | POA: Diagnosis present

## 2018-03-07 DIAGNOSIS — E785 Hyperlipidemia, unspecified: Secondary | ICD-10-CM | POA: Diagnosis present

## 2018-03-07 DIAGNOSIS — N390 Urinary tract infection, site not specified: Secondary | ICD-10-CM | POA: Diagnosis present

## 2018-03-07 DIAGNOSIS — G9341 Metabolic encephalopathy: Secondary | ICD-10-CM | POA: Diagnosis present

## 2018-03-07 DIAGNOSIS — L89100 Pressure ulcer of unspecified part of back, unstageable: Secondary | ICD-10-CM | POA: Diagnosis present

## 2018-03-07 DIAGNOSIS — E43 Unspecified severe protein-calorie malnutrition: Secondary | ICD-10-CM | POA: Diagnosis present

## 2018-03-07 DIAGNOSIS — L8990 Pressure ulcer of unspecified site, unspecified stage: Secondary | ICD-10-CM | POA: Diagnosis present

## 2018-03-07 DIAGNOSIS — E1165 Type 2 diabetes mellitus with hyperglycemia: Secondary | ICD-10-CM | POA: Diagnosis present

## 2018-03-07 DIAGNOSIS — Z7984 Long term (current) use of oral hypoglycemic drugs: Secondary | ICD-10-CM

## 2018-03-07 DIAGNOSIS — I471 Supraventricular tachycardia: Secondary | ICD-10-CM | POA: Diagnosis present

## 2018-03-07 DIAGNOSIS — Z825 Family history of asthma and other chronic lower respiratory diseases: Secondary | ICD-10-CM

## 2018-03-07 LAB — URINALYSIS, ROUTINE W REFLEX MICROSCOPIC
Bilirubin Urine: NEGATIVE
Glucose, UA: >=500 mg/dL — AB
Hgb urine dipstick: NEGATIVE
Ketones, ur: NEGATIVE mg/dL
Nitrite: NEGATIVE
Protein, ur: 30 mg/dL — AB
SPECIFIC GRAVITY, URINE: 1.017 (ref 1.005–1.030)
WBC, UA: >50 WBC/hpf — ABNORMAL HIGH (ref 0–5)
pH: 5 (ref 5.0–8.0)

## 2018-03-07 LAB — COMPREHENSIVE METABOLIC PANEL
ALK PHOS: 55 U/L (ref 38–126)
ALT: 12 U/L — AB (ref 14–54)
AST: 22 U/L (ref 15–41)
Albumin: 2.4 g/dL — ABNORMAL LOW (ref 3.5–5.0)
Anion gap: 12 (ref 5–15)
BUN: 37 mg/dL — ABNORMAL HIGH (ref 6–20)
CALCIUM: 8.9 mg/dL (ref 8.9–10.3)
CO2: 25 mmol/L (ref 22–32)
CREATININE: 1.47 mg/dL — AB (ref 0.44–1.00)
Chloride: 99 mmol/L — ABNORMAL LOW (ref 101–111)
GFR, EST AFRICAN AMERICAN: 35 mL/min — AB (ref >60)
GFR, EST NON AFRICAN AMERICAN: 30 mL/min — AB (ref >60)
Glucose, Bld: 432 mg/dL — ABNORMAL HIGH (ref 65–99)
Potassium: 4.5 mmol/L (ref 3.5–5.1)
SODIUM: 136 mmol/L (ref 135–145)
Total Bilirubin: 0.2 mg/dL — ABNORMAL LOW (ref 0.3–1.2)
Total Protein: 6.4 g/dL — ABNORMAL LOW (ref 6.5–8.1)

## 2018-03-07 LAB — CBC WITH DIFFERENTIAL/PLATELET
Basophils Absolute: 0 10*3/uL (ref 0.0–0.1)
Basophils Relative: 0 %
Eosinophils Absolute: 0 10*3/uL (ref 0.0–0.7)
Eosinophils Relative: 0 %
HCT: 29.9 % — ABNORMAL LOW (ref 36.0–46.0)
HEMOGLOBIN: 8.9 g/dL — AB (ref 12.0–15.0)
LYMPHS ABS: 1.6 10*3/uL (ref 0.7–4.0)
LYMPHS PCT: 10 %
MCH: 28 pg (ref 26.0–34.0)
MCHC: 29.8 g/dL — ABNORMAL LOW (ref 30.0–36.0)
MCV: 94 fL (ref 78.0–100.0)
Monocytes Absolute: 0.8 10*3/uL (ref 0.1–1.0)
Monocytes Relative: 5 %
NEUTROS PCT: 85 %
Neutro Abs: 14.6 10*3/uL — ABNORMAL HIGH (ref 1.7–7.7)
Platelets: 306 10*3/uL (ref 150–400)
RBC: 3.18 MIL/uL — AB (ref 3.87–5.11)
RDW: 14.9 % (ref 11.5–15.5)
WBC: 17.1 10*3/uL — AB (ref 4.0–10.5)

## 2018-03-07 LAB — CBG MONITORING, ED: Glucose-Capillary: 300 mg/dL — ABNORMAL HIGH (ref 65–99)

## 2018-03-07 MED ORDER — ACETAMINOPHEN 650 MG RE SUPP: 650.0000 mg | Freq: Four times a day (QID) | RECTAL | Status: DC | PRN

## 2018-03-07 MED ORDER — INSULIN GLARGINE 100 UNIT/ML ~~LOC~~ SOLN
5.0000 [IU] | Freq: Every day | SUBCUTANEOUS | Status: DC
  Administered 2018-03-08 – 2018-03-11 (×4): 5 [IU] via SUBCUTANEOUS
  Filled 2018-03-07 (×6): qty 0.05

## 2018-03-07 MED ORDER — ADULT MULTIVITAMIN W/MINERALS CH
ORAL_TABLET | Freq: Every morning | ORAL | Status: DC
  Administered 2018-03-08 – 2018-03-11 (×4): 1 via ORAL
  Filled 2018-03-07 (×4): qty 1

## 2018-03-07 MED ORDER — PIPERACILLIN-TAZOBACTAM 3.375 G IVPB 30 MIN
3.3750 g | Freq: Once | INTRAVENOUS | Status: AC
  Administered 2018-03-07: 3.375 g via INTRAVENOUS
  Filled 2018-03-07: qty 50

## 2018-03-07 MED ORDER — ASPIRIN EC 81 MG PO TBEC
81.0000 mg | DELAYED_RELEASE_TABLET | Freq: Two times a day (BID) | ORAL | Status: DC
  Administered 2018-03-08 – 2018-03-11 (×7): 81 mg via ORAL
  Filled 2018-03-07 (×7): qty 1

## 2018-03-07 MED ORDER — PREDNISONE 10 MG PO TABS
5.0000 mg | ORAL_TABLET | Freq: Every day | ORAL | Status: DC
  Administered 2018-03-08 – 2018-03-11 (×4): 5 mg via ORAL
  Filled 2018-03-07 (×5): qty 1

## 2018-03-07 MED ORDER — ONDANSETRON HCL 4 MG PO TABS: 4.0000 mg | ORAL_TABLET | Freq: Four times a day (QID) | ORAL | Status: DC | PRN

## 2018-03-07 MED ORDER — LEVOFLOXACIN IN D5W 500 MG/100ML IV SOLN: 500.0000 mg | Freq: Once | INTRAVENOUS | Status: DC

## 2018-03-07 MED ORDER — SODIUM CHLORIDE 0.9 % IV SOLN
Freq: Once | INTRAVENOUS | Status: AC
  Administered 2018-03-07: 20:00:00 via INTRAVENOUS

## 2018-03-07 MED ORDER — VANCOMYCIN HCL 500 MG IV SOLR
500.0000 mg | INTRAVENOUS | Status: DC
  Filled 2018-03-07: qty 500

## 2018-03-07 MED ORDER — ENOXAPARIN SODIUM 30 MG/0.3ML ~~LOC~~ SOLN
30.0000 mg | SUBCUTANEOUS | Status: DC
  Administered 2018-03-08 – 2018-03-11 (×4): 30 mg via SUBCUTANEOUS
  Filled 2018-03-07 (×4): qty 0.3

## 2018-03-07 MED ORDER — METOPROLOL SUCCINATE ER 25 MG PO TB24
12.5000 mg | ORAL_TABLET | Freq: Every day | ORAL | Status: DC
  Administered 2018-03-08 – 2018-03-11 (×4): 12.5 mg via ORAL
  Filled 2018-03-07 (×4): qty 1

## 2018-03-07 MED ORDER — VANCOMYCIN HCL IN DEXTROSE 1-5 GM/200ML-% IV SOLN
1000.0000 mg | Freq: Once | INTRAVENOUS | Status: AC
  Administered 2018-03-07: 1000 mg via INTRAVENOUS
  Filled 2018-03-07: qty 200

## 2018-03-07 MED ORDER — MIRTAZAPINE 15 MG PO TABS
7.5000 mg | ORAL_TABLET | Freq: Every day | ORAL | Status: DC
  Administered 2018-03-08 – 2018-03-10 (×3): 7.5 mg via ORAL
  Filled 2018-03-07 (×3): qty 1

## 2018-03-07 MED ORDER — SODIUM CHLORIDE 0.9 % IV SOLN
INTRAVENOUS | Status: AC
  Administered 2018-03-07 – 2018-03-09 (×3): via INTRAVENOUS

## 2018-03-07 MED ORDER — DILTIAZEM HCL 60 MG PO TABS
120.0000 mg | ORAL_TABLET | Freq: Every day | ORAL | Status: DC
  Administered 2018-03-08 – 2018-03-11 (×4): 120 mg via ORAL
  Filled 2018-03-07 (×4): qty 2

## 2018-03-07 MED ORDER — RALOXIFENE HCL 60 MG PO TABS
60.0000 mg | ORAL_TABLET | Freq: Every day | ORAL | Status: DC
  Administered 2018-03-08 – 2018-03-11 (×4): 60 mg via ORAL
  Filled 2018-03-07 (×4): qty 1

## 2018-03-07 MED ORDER — GUAIFENESIN ER 600 MG PO TB12: 600.0000 mg | ORAL_TABLET | Freq: Two times a day (BID) | ORAL | Status: DC | PRN

## 2018-03-07 MED ORDER — ATORVASTATIN CALCIUM 10 MG PO TABS
10.0000 mg | ORAL_TABLET | Freq: Every evening | ORAL | Status: DC
  Administered 2018-03-08 – 2018-03-10 (×3): 10 mg via ORAL
  Filled 2018-03-07 (×3): qty 1

## 2018-03-07 MED ORDER — SODIUM CHLORIDE 0.9 % IV BOLUS
500.0000 mL | Freq: Once | INTRAVENOUS | Status: AC
  Administered 2018-03-07: 500 mL via INTRAVENOUS

## 2018-03-07 MED ORDER — PIPERACILLIN-TAZOBACTAM 3.375 G IVPB
3.3750 g | Freq: Three times a day (TID) | INTRAVENOUS | Status: DC
  Administered 2018-03-08 – 2018-03-11 (×10): 3.375 g via INTRAVENOUS
  Filled 2018-03-07 (×11): qty 50

## 2018-03-07 MED ORDER — CEFAZOLIN SODIUM-DEXTROSE 1-4 GM/50ML-% IV SOLN: 1.0000 g | Freq: Once | INTRAVENOUS | Status: DC

## 2018-03-07 MED ORDER — INSULIN ASPART 100 UNIT/ML ~~LOC~~ SOLN
0.0000 [IU] | Freq: Four times a day (QID) | SUBCUTANEOUS | Status: DC
  Administered 2018-03-08: 2 [IU] via SUBCUTANEOUS
  Administered 2018-03-08: 3 [IU] via SUBCUTANEOUS
  Administered 2018-03-08: 1 [IU] via SUBCUTANEOUS
  Administered 2018-03-09: 2 [IU] via SUBCUTANEOUS
  Administered 2018-03-09: 1 [IU] via SUBCUTANEOUS
  Administered 2018-03-09: 5 [IU] via SUBCUTANEOUS
  Administered 2018-03-09: 1 [IU] via SUBCUTANEOUS
  Administered 2018-03-10 (×2): 2 [IU] via SUBCUTANEOUS
  Administered 2018-03-11: 5 [IU] via SUBCUTANEOUS
  Administered 2018-03-11: 1 [IU] via SUBCUTANEOUS

## 2018-03-07 MED ORDER — MOMETASONE FURO-FORMOTEROL FUM 200-5 MCG/ACT IN AERO
2.0000 | INHALATION_SPRAY | Freq: Two times a day (BID) | RESPIRATORY_TRACT | Status: DC
  Administered 2018-03-08 – 2018-03-10 (×7): 2 via RESPIRATORY_TRACT
  Filled 2018-03-07 (×2): qty 8.8

## 2018-03-07 MED ORDER — ONDANSETRON HCL 4 MG/2ML IJ SOLN: 4.0000 mg | Freq: Four times a day (QID) | INTRAMUSCULAR | Status: DC | PRN

## 2018-03-07 MED ORDER — IPRATROPIUM-ALBUTEROL 0.5-2.5 (3) MG/3ML IN SOLN
3.0000 mL | Freq: Two times a day (BID) | RESPIRATORY_TRACT | Status: DC
  Administered 2018-03-08 (×2): 3 mL via RESPIRATORY_TRACT
  Filled 2018-03-07 (×3): qty 3

## 2018-03-07 MED ORDER — PANTOPRAZOLE SODIUM 40 MG PO TBEC
40.0000 mg | DELAYED_RELEASE_TABLET | Freq: Every day | ORAL | Status: DC
  Administered 2018-03-08 – 2018-03-11 (×4): 40 mg via ORAL
  Filled 2018-03-07 (×4): qty 1

## 2018-03-07 MED ORDER — ACETAMINOPHEN 325 MG PO TABS
650.0000 mg | ORAL_TABLET | Freq: Four times a day (QID) | ORAL | Status: DC | PRN
  Administered 2018-03-10: 650 mg via ORAL
  Filled 2018-03-07: qty 2

## 2018-03-07 NOTE — Progress Notes (Signed)
Pharmacy Antibiotic Note  [name] is a 84 y.o. female admitted on 03/07/2018 with pneumonia.  Pharmacy has been consulted for Vancomycin and Zosyn dosing.  Plan: Vancomycin 1000 mg IV x 1 dose Vancomycin 500 mg IV every 24 hours.  Goal trough 15-20 mcg/mL. Zosyn 3.375g IV q8h (4 hour infusion).  Monitor labs, c/s, and vanco trough as indicated  Patient has listed allergy to ceftriaxone with swelling but has taken multiple times in the past.  Also, has successfully taken cefazolin.  Height: 5\' 3"  (160 cm) Weight: 115 lb (52.2 kg) IBW/kg (Calculated) : 52.4  Temp (24hrs), Avg:100.1 F (37.8 C), Min:100.1 F (37.8 C), Max:100.1 F (37.8 C)  Recent Labs  Lab 03/07/18 1801  WBC 17.1*  CREATININE 1.47*    Estimated Creatinine Clearance: 21.4 mL/min (A) (by C-G formula based on SCr of 1.47 mg/dL (H)).    Allergies  Allergen Reactions  . Aspirin Other (See Comments)    Cannot take uncoated due to stomach ulcers  . Megace [Megestrol]     abd pain   . Rocephin [Ceftriaxone Sodium In Dextrose] Swelling    Antimicrobials this admission: Zosyn 6/2 >>  Vanco 6/2 >>   Dose adjustments this admission: N/A  Microbiology results: 6/2 BCx: pending 6/2 UCx: pending   Thank you for allowing pharmacy to be a part of this patient's care.  Ramond Craver 03/07/2018 10:05 PM

## 2018-03-07 NOTE — ED Provider Notes (Signed)
[facility] EMERGENCY DEPARTMENT Provider Note   CSN: [id number] Arrival date & time: 03/07/18  1739     History   Chief Complaint Chief Complaint  Patient presents with  . Hyperglycemia    HPI [name] is a 84 y.o. female.  Plaint is lethargic, high blood sugars.  HPI: This is an 84 year old female.  She lives at home.  She has a daughter lives with her full-time.  She has a Dietitian that stays with her weekdays during the day.  Family is with her at night and weekends.  She was admitted for UTI and sepsis 24 days ago.  Had a 1 day stay.  Treated with Levaquin.  Discharged on [place].  Urine culture subsequently grew out E. coli.  It was intermediately sensitive to Cipro.  She did finish a 7-day course of Levaquin.  She improved.  Blood pressures and blood sugars were reasonable.  Over the last 3 days she has been less active.  Family states that been encouraging liquids and she is been doing fairly well.  No vomiting.  Felt warm.  Yesterday and today was weak.  Sleeping most of the time.  Sugars slowly increasing over the last 3 days from 100s up to 400s.  Family became concerned and they present her here.  Past Medical History:  Diagnosis Date  . Acid reflux   . Anemia   . Arthritis    ra  . Asthma   . Cancer [facility])    breast cancer  . Coronary artery disease   . Diabetes mellitus without complication ([place])   . Diverticulitis   . Gastric ulcer   . Hyperlipidemia   . Hypertension   . Hypoxemia   . Kyphosis   . Kyphosis   . Mitral valve disorder   . Osteoporosis   . Oxygen dependent    2 liters at night  . Raynaud disease   . Thrombocytopenia [facility])     Patient Active Problem List   Diagnosis Date Noted  . SVT (supraventricular tachycardia) ([place]) 01/06/2018  . Sepsis secondary to UTI ([place]) 01/05/2018  . Hypernatremia 01/05/2018  . Decubitus ulcer of thoracic spine area, stage III ([place]) 01/05/2018  . Decubitus ulcer of sacral region, stage 2  01/05/2018  . Anemia 01/05/2018  . HCAP (healthcare-associated pneumonia) 03/06/2017  . Hyperglycemia 03/03/2017  . Electrolyte abnormality 03/03/2017  . Pressure injury of skin 01/16/2017  . Dehydration   . Lactic acidosis 07/19/2016  . Asthma, chronic 04/12/2016  . Sepsis ([place]) 03/08/2016  . CAP (community acquired pneumonia) 10/09/2015  . Pressure ulcer 10/09/2015  . Kyphosis   . UTI (urinary tract infection) 06/30/2015  . Breast cancer of upper-outer quadrant of left female breast ([place]) 05/09/2015  . Rheumatoid arthritis ([place]) 10/29/2012  . HTN (hypertension) 10/29/2012  . Diabetes type 2, controlled ([place]) 10/29/2012  . GERD (gastroesophageal reflux disease) 10/29/2012    Past Surgical History:  Procedure Laterality Date  . BREAST SURGERY    . HERNIA REPAIR    . MASTECTOMY Right 1978  . SIMPLE MASTECTOMY WITH AXILLARY SENTINEL NODE BIOPSY Left 05/09/2015   Procedure: LEFT SIMPLE MASTECTOMY;  Surgeon: Erroll Luna, MD;  Location: [place] OR;  Service: General;  Laterality: Left;     OB History    Gravida  4   Para  4   Term  4   Preterm      AB      Living  4     SAB  TAB      Ectopic      Multiple      Live Births               Home Medications    Prior to Admission medications   Medication Sig Start Date End Date Taking? Authorizing Provider  aspirin EC 81 MG tablet Take 81 mg by mouth 2 (two) times daily.    Yes [provider]  atorvastatin (LIPITOR) 10 MG tablet Take 10 mg by mouth every evening.    Yes [provider]  B-D UF III MINI PEN NEEDLES 31G X 5 MM MISC 4 (four) times daily. use as directed 02/23/18  Yes [provider]  budesonide-formoterol (SYMBICORT) 160-4.5 MCG/ACT inhaler Inhale 2 puffs into the lungs 2 (two) times daily.   Yes [provider]  Cholecalciferol (VITAMIN D3) 2000 units capsule Take 2,000 Units by mouth daily.    Yes [provider]  ciclopirox (PENLAC) 8 % solution  APPLY TO FUNGAL TOENAILS EVERY DAY 02/11/16  Yes [provider]  ciprofloxacin (CIPRO) 500 MG tablet Take 1 mg by mouth 2 (two) times daily. Take 1 tablet every 12 hours for 10 days 02/24/18  Yes [provider]  D-MANNOSE PO Take 500 mg by mouth daily.    Yes [provider]  diltiazem (CARDIZEM) 120 MG tablet Take 120 mg by mouth daily.   Yes [provider]  insulin aspart (NOVOLOG) 100 UNIT/ML FlexPen Inject 4 Units into the skin 3 (three) times daily with meals. Patient taking differently: Inject 1-10 Units into the skin 3 (three) times daily with meals. Per sliding scale. For levels over 350- 10 units is administered 03/07/17  Yes Memon, Jolaine Artist, MD  Insulin Glargine (LANTUS) 100 UNIT/ML Solostar Pen Inject 5 Units into the skin daily. Patient taking differently: Inject 3 Units into the skin at bedtime.  03/07/17  Yes Kathie Dike, MD  ipratropium-albuterol (DUONEB) 0.5-2.5 (3) MG/3ML SOLN Take 3 mLs by nebulization 2 (two) times daily. 03/07/17  Yes Kathie Dike, MD  lactobacillus acidophilus & bulgar (LACTINEX) chewable tablet CHEW 1 TABLET BY MOUTH EVERY DAY 09/17/15  Yes [provider]  magnesium oxide (MAG-OX) 400 (241.3 Mg) MG tablet Take 0.5 tablets (200 mg total) by mouth daily. Patient taking differently: Take 200-400 mg by mouth daily. Alternating 200 mg one day then 400 mg the next. 04/15/16  Yes Reyne Dumas, MD  metFORMIN (GLUCOPHAGE) 500 MG tablet Take 1,000 mg by mouth 2 (two) times daily with a meal.   Yes [provider]  metoprolol succinate (TOPROL-XL) 25 MG 24 hr tablet Take 0.5 tablets (12.5 mg total) by mouth daily. 01/11/18  Yes Johnson, Clanford L, MD  mirtazapine (REMERON) 15 MG tablet Take 0.5 tablets (7.5 mg total) by mouth at bedtime. 02/12/18  Yes Johnson, Clanford L, MD  Multiple Vitamins-Minerals (CENTRUM SILVER ADULT 50+ PO) Take 1 tablet by mouth every morning.    Yes [provider]  nitrofurantoin  (MACRODANTIN) 50 MG capsule Take 50 mg by mouth at bedtime.  02/23/18  Yes [provider]  pantoprazole (PROTONIX) 40 MG tablet Take 40 mg by mouth daily.   Yes [provider]  POLY-IRON 150 FORTE 150-25-1 MG-MCG-MG CAPS TAKE ONE CAPSULE BY MOUTH DAILY 04/07/17  Yes Kefalas, Manon Hilding, PA-C  Polyvinyl Alcohol-Povidone (CLEAR EYES ALL SEASONS) 5-6 MG/ML SOLN Apply 1 drop to eye every morning.    Yes [provider]  predniSONE (DELTASONE) 5 MG tablet Take  1 tablet (5 mg total) by mouth daily with breakfast. 02/12/18  Yes Johnson, Clanford L, MD  raloxifene (EVISTA) 60 MG tablet Take 60 mg by mouth daily.   Yes [provider]  guaiFENesin (MUCINEX) 600 MG 12 hr tablet Take 1 tablet (600 mg total) by mouth 2 (two) times daily. Patient taking differently: Take 600 mg by mouth 2 (two) times daily as needed for cough or to loosen phlegm.  03/07/17   Kathie Dike, MD    Family History Family History  Problem Relation Age of Onset  . Asthma Sister   . Rheum arthritis Daughter     Social History Social History   Tobacco Use  . Smoking status: Never Smoker  . Smokeless tobacco: Never Used  Substance Use Topics  . Alcohol use: No  . Drug use: No     Allergies   Aspirin; Megace [megestrol]; and Rocephin [ceftriaxone sodium in dextrose]   Review of Systems Review of Systems  Constitutional: Positive for activity change and fatigue. Negative for appetite change, chills, diaphoresis and fever.  HENT: Negative for mouth sores, sore throat and trouble swallowing.   Eyes: Negative for visual disturbance.  Respiratory: Negative for cough, chest tightness, shortness of breath and wheezing.   Cardiovascular: Negative for chest pain.  Gastrointestinal: Negative for abdominal distention, abdominal pain, diarrhea, nausea and vomiting.  Endocrine: Negative for polydipsia, polyphagia and polyuria.  Genitourinary: Negative for dysuria, frequency and hematuria.    Musculoskeletal: Negative for gait problem.  Skin: Negative for color change, pallor and rash.  Neurological: Negative for dizziness, syncope, light-headedness and headaches.  Hematological: Does not bruise/bleed easily.  Psychiatric/Behavioral: Negative for behavioral problems and confusion.     Physical Exam Updated Vital Signs BP (!) 90/49   Pulse 87   Temp 100.1 F (37.8 C) (Temporal)   Resp 20   Ht 5\' 3"  (1.6 m)   Wt 52.2 kg (115 lb)   SpO2 96%   BMI 20.37 kg/m   Physical Exam  Constitutional: She is oriented to person, place, and time. No distress.  Thin frail nonverbal 84 year old female.  Head is literally laying against her left shoulder.  Family states this is chronic due to her scoliosis and rheumatoid.  HENT:  Head: Normocephalic.  Conjunctive a not pale.  Oropharynx pink and moist.  Eyes: Pupils are equal, round, and reactive to light. Conjunctivae are normal. No scleral icterus.  Neck: Normal range of motion. Neck supple. No thyromegaly present.  Cardiovascular: Normal rate and regular rhythm. Exam reveals no gallop and no friction rub.  No murmur heard. Pulmonary/Chest: Effort normal and breath sounds normal. No respiratory distress. She has no wheezes. She has no rales.  Diffuse rhonchorous breath sounds.  90 to 93% on room air  Abdominal: Soft. Bowel sounds are normal. She exhibits no distension. There is no tenderness. There is no rebound.  Soft scaphoid.  No rigidity.  Musculoskeletal: Normal range of motion.  Thin extremities.  No rash.  No soft tissue swelling or edema.  Neurological: She is alert and oriented to person, place, and time.  Skin: Skin is warm and dry. No rash noted.  Psychiatric: She has a normal mood and affect. Her behavior is normal.     ED Treatments / Results  Labs (all labs ordered are listed, but only abnormal results are displayed) Labs Reviewed  URINALYSIS, ROUTINE W REFLEX MICROSCOPIC - Abnormal; Notable for the following  components:      Result Value   APPearance CLOUDY (*)  Glucose, UA >=500 (*)    Protein, ur 30 (*)    Leukocytes, UA MODERATE (*)    WBC, UA >50 (*)    Bacteria, UA MANY (*)    All other components within normal limits  CBC WITH DIFFERENTIAL/PLATELET - Abnormal; Notable for the following components:   WBC 17.1 (*)    RBC 3.18 (*)    Hemoglobin 8.9 (*)    HCT 29.9 (*)    MCHC 29.8 (*)    Neutro Abs 14.6 (*)    All other components within normal limits  COMPREHENSIVE METABOLIC PANEL - Abnormal; Notable for the following components:   Chloride 99 (*)    Glucose, Bld 432 (*)    BUN 37 (*)    Creatinine, Ser 1.47 (*)    Total Protein 6.4 (*)    Albumin 2.4 (*)    ALT 12 (*)    Total Bilirubin 0.2 (*)    GFR calc non Af Amer 30 (*)    GFR calc Af Amer 35 (*)    All other components within normal limits  CBG MONITORING, ED    EKG None  Radiology Dg Chest Port 1 View  Result Date: 03/07/2018 CLINICAL DATA:  Productive cough. EXAM: PORTABLE CHEST 1 VIEW COMPARISON:  Radiograph 02/11/2018, additional priors FINDINGS: Exaggerated kyphosis with patient's chin obscuring the left lung apex, similar to prior exams, positioning limits assessment. Elevated left hemidiaphragm with opacity at the left lung base, new. Heart size and mediastinal contours are unchanged allowing for differences in positioning. Mild vascular congestion without pulmonary edema. No pneumothorax. Limited assessment for pleural effusion. IMPRESSION: Left basilar opacity, in the setting of volume loss in the left hemithorax may be aspiration or atelectasis/collapse. Pneumonia is also considered. Recommend radiographic follow-up to resolution. Electronically Signed   By: Jeb Levering M.D.   On: 03/07/2018 19:36    Procedures Procedures (including critical care time)  Medications Ordered in ED Medications  piperacillin-tazobactam (ZOSYN) IVPB 3.375 g (3.375 g Intravenous New Bag/Given 03/07/18 2030)  sodium  chloride 0.9 % bolus 500 mL (0 mLs Intravenous Stopped 03/07/18 2027)  0.9 %  sodium chloride infusion ( Intravenous New Bag/Given 03/07/18 1956)     Initial Impression / Assessment and Plan / ED Course  I have reviewed the triage vital signs and the nursing notes.  Pertinent labs & imaging results that were available during my care of the patient were reviewed by me and considered in my medical decision making (see chart for details).    Blood pressure 91 systolic.  In review of her family's log at home she typically runs between 161 and 096 systolic.  Was given 500 cc of IV fluid and her blood pressure improved to 120.  Temp 100.1 temporal.  WBC 17.1.  Hemoglobin 8.9 her baseline.  Urine is cloudy, glucose, moderate leukocytes greater than 50 white blood cells and bacteria.  Culture requested.  Creatinine 1.4 glucose 432.  Anion gap 12.  X-ray suggests left lung infiltrate.  However a markedly difficult read due to scoliosis and positioning.  However with patient having cough and borderline low sats will cover for possible H CAP.  Needs UTI coverage as well.  X prednisone 5 daily.  Her blood pressure did improve.  Her most recent urine culture was E. coli.  It was resistant to Cipro.  She has an intolerance to Rocephin due to swelling.  Have ordered vancomycin and Zosyn.  This would be good H CAP coverage.  Her most  recent UTI with E. coli was sensitive to Zosyn.  Will discuss stress dosing of steroids with hospitalist.  I placed a call to discuss admission.  Given 10 units subcu insulin.  CRITICAL CARE Performed by: Lolita Patella   Total critical care time: 30 minutes  Critical care time was exclusive of separately billable procedures and treating other patients.  Critical care was necessary to treat or prevent imminent or life-threatening deterioration.  Critical care was time spent personally by me on the following activities: development of treatment plan with patient and/or  surrogate as well as nursing, discussions with consultants, evaluation of patient's response to treatment, examination of patient, obtaining history from patient or surrogate, ordering and performing treatments and interventions, ordering and review of laboratory studies, ordering and review of radiographic studies, pulse oximetry and re-evaluation of patient's condition.   Final Clinical Impressions(s) / ED Diagnoses   Final diagnoses:  Hyperglycemia  Lower urinary tract infectious disease  HCAP (healthcare-associated pneumonia)    ED Discharge Orders    None       Tanna Furry, MD 03/07/18 2030

## 2018-03-07 NOTE — H&P (Addendum)
TRH H&P    Patient Demographics:    Kammi Hechler, is a 84 y.o. female  MRN: [id number]  DOB - 09/22/1926  Admit Date - 03/07/2018  Referring MD/NP/PA: Tanna Furry   Outpatient Primary MD for the patient is Moshe Cipro, MD  Patient coming from: home   Chief complaint-lethargy, weakness, hyperglycemia   HPI:    Pairlee Sawtell  is a 84 y.o. female, with history of rheumatoid arthritis, breast cancer, coronary disease, diabetes mellitus without complication, hypertension, hyperlipidemia who was recently discharged from the hospital on 02/12/2018 after she was treated for UTI.  Patient was discharged on Levaquin however urine culture grew  E. coli which was resistant to ciprofloxacin.  Urine culture was supposed to be followed up as outpatient. As per family patient did not return to her baseline.  Patient is currently bedbound due to rheumatoid arthritis, and is totally dependent.  However she is able to communicate and eat well.  Over the past few days she has not been eating well.  In the ED UA again is abnormal, chest x-ray showed possible aspiration pneumonia. Patient denies any chest pain Denies shortness of breath. Denies abdominal pain, nausea vomiting or diarrhea. Denies dysuria.  Patient started on vancomycin and Zosyn for Healthcare associated pneumonia    Review of systems:      All other systems reviewed and are negative.   With Past History of the following :    Past Medical History:  Diagnosis Date  . Acid reflux   . Anemia   . Arthritis    ra  . Asthma   . Cancer [facility]    breast cancer  . Coronary artery disease   . Diabetes mellitus without complication ([place])   . Diverticulitis   . Gastric ulcer   . Hyperlipidemia   . Hypertension   . Hypoxemia   . Kyphosis   . Kyphosis   . Mitral valve disorder   . Osteoporosis   . Oxygen dependent    2 liters at night  . Raynaud  disease   . Thrombocytopenia ([place])       Past Surgical History:  Procedure Laterality Date  . BREAST SURGERY    . HERNIA REPAIR    . MASTECTOMY Right 1978  . SIMPLE MASTECTOMY WITH AXILLARY SENTINEL NODE BIOPSY Left 05/09/2015   Procedure: LEFT SIMPLE MASTECTOMY;  Surgeon: Erroll Luna, MD;  Location: [place] OR;  Service: General;  Laterality: Left;      Social History:      Social History   Tobacco Use  . Smoking status: Never Smoker  . Smokeless tobacco: Never Used  Substance Use Topics  . Alcohol use: No       Family History :     Family History  Problem Relation Age of Onset  . Asthma Sister   . Rheum arthritis Daughter    Appears in no acute distress   Home Medications:   Prior to Admission medications   Medication Sig Start Date End Date Taking? Authorizing Provider  aspirin EC 81 MG tablet  Take 81 mg by mouth 2 (two) times daily.    Yes [provider]  atorvastatin (LIPITOR) 10 MG tablet Take 10 mg by mouth every evening.    Yes [provider]  B-D UF III MINI PEN NEEDLES 31G X 5 MM MISC 4 (four) times daily. use as directed 02/23/18  Yes [provider]  budesonide-formoterol (SYMBICORT) 160-4.5 MCG/ACT inhaler Inhale 2 puffs into the lungs 2 (two) times daily.   Yes [provider]  Cholecalciferol (VITAMIN D3) 2000 units capsule Take 2,000 Units by mouth daily.    Yes [provider]  ciclopirox (PENLAC) 8 % solution APPLY TO FUNGAL TOENAILS EVERY DAY 02/11/16  Yes [provider]  ciprofloxacin (CIPRO) 500 MG tablet Take 1 mg by mouth 2 (two) times daily. Take 1 tablet every 12 hours for 10 days 02/24/18  Yes [provider]  D-MANNOSE PO Take 500 mg by mouth daily.    Yes [provider]  diltiazem (CARDIZEM) 120 MG tablet Take 120 mg by mouth daily.   Yes [provider]  insulin aspart (NOVOLOG) 100 UNIT/ML FlexPen Inject 4 Units into the skin 3 (three) times daily with  meals. Patient taking differently: Inject 1-10 Units into the skin 3 (three) times daily with meals. Per sliding scale. For levels over 350- 10 units is administered 03/07/17  Yes Memon, Jolaine Artist, MD  Insulin Glargine (LANTUS) 100 UNIT/ML Solostar Pen Inject 5 Units into the skin daily. Patient taking differently: Inject 3 Units into the skin at bedtime.  03/07/17  Yes Kathie Dike, MD  ipratropium-albuterol (DUONEB) 0.5-2.5 (3) MG/3ML SOLN Take 3 mLs by nebulization 2 (two) times daily. 03/07/17  Yes Kathie Dike, MD  lactobacillus acidophilus & bulgar (LACTINEX) chewable tablet CHEW 1 TABLET BY MOUTH EVERY DAY 09/17/15  Yes [provider]  magnesium oxide (MAG-OX) 400 (241.3 Mg) MG tablet Take 0.5 tablets (200 mg total) by mouth daily. Patient taking differently: Take 200-400 mg by mouth daily. Alternating 200 mg one day then 400 mg the next. 04/15/16  Yes Reyne Dumas, MD  metFORMIN (GLUCOPHAGE) 500 MG tablet Take 1,000 mg by mouth 2 (two) times daily with a meal.   Yes [provider]  metoprolol succinate (TOPROL-XL) 25 MG 24 hr tablet Take 0.5 tablets (12.5 mg total) by mouth daily. 01/11/18  Yes Johnson, Clanford L, MD  mirtazapine (REMERON) 15 MG tablet Take 0.5 tablets (7.5 mg total) by mouth at bedtime. 02/12/18  Yes Johnson, Clanford L, MD  Multiple Vitamins-Minerals (CENTRUM SILVER ADULT 50+ PO) Take 1 tablet by mouth every morning.    Yes [provider]  nitrofurantoin (MACRODANTIN) 50 MG capsule Take 50 mg by mouth at bedtime.  02/23/18  Yes [provider]  pantoprazole (PROTONIX) 40 MG tablet Take 40 mg by mouth daily.   Yes [provider]  POLY-IRON 150 FORTE 150-25-1 MG-MCG-MG CAPS TAKE ONE CAPSULE BY MOUTH DAILY 04/07/17  Yes Kefalas, Manon Hilding, PA-C  Polyvinyl Alcohol-Povidone (CLEAR EYES ALL SEASONS) 5-6 MG/ML SOLN Apply 1 drop to eye every morning.    Yes [provider]  predniSONE (DELTASONE) 5 MG tablet Take 1 tablet (5 mg  total) by mouth daily with breakfast. 02/12/18  Yes Johnson, Clanford L, MD  raloxifene (EVISTA) 60 MG tablet Take 60 mg by mouth daily.   Yes [provider]  guaiFENesin (MUCINEX) 600 MG 12 hr tablet Take 1 tablet (600 mg total) by mouth 2 (two) times daily. Patient taking differently: Take 600  mg by mouth 2 (two) times daily as needed for cough or to loosen phlegm.  03/07/17   Kathie Dike, MD     Allergies:     Allergies  Allergen Reactions  . Aspirin Other (See Comments)    Cannot take uncoated due to stomach ulcers  . Megace [Megestrol]     abd pain   . Rocephin [Ceftriaxone Sodium In Dextrose] Swelling     Physical Exam:   Vitals  Blood pressure (!) 113/43, pulse 83, temperature 100.1 F (37.8 C), temperature source Temporal, resp. rate 16, height 5\' 3"  (1.6 m), weight 52.2 kg (115 lb), SpO2 96 %.  1.  General: Appears in no acute distress  2. Psychiatric:  Intact judgement and  insight, awake alert, oriented x 3.  3. Neurologic: No focal neurological deficits, all cranial nerves intact.Strength 5/5 all 4 extremities, sensation intact all 4 extremities, plantars down going.  4. Eyes :  anicteric sclerae, moist conjunctivae with no lid lag. PERRLA.  5. ENMT:  Oropharynx clear with moist mucous membranes and good dentition  6. Neck:  supple, no cervical lymphadenopathy appriciated, No thyromegaly  7. Respiratory : Normal respiratory effort, decreased breath sounds bilaterally  8. Cardiovascular : RRR, no gallops, rubs or murmurs, no leg edema  9. Gastrointestinal:  Positive bowel sounds, abdomen soft, non-tender to palpation,no hepatosplenomegaly, no rigidity or guarding       10. Skin:  No cyanosis, normal texture and turgor, no rash, lesions or ulcers  11.Musculoskeletal:  Swan-neck deformity noted in the left hand, contractures noted in both upper and lower extremities    Data Review:    CBC Recent Labs  Lab 03/07/18 1801  WBC 17.1*   HGB 8.9*  HCT 29.9*  PLT 306  MCV 94.0  MCH 28.0  MCHC 29.8*  RDW 14.9  LYMPHSABS 1.6  MONOABS 0.8  EOSABS 0.0  BASOSABS 0.0   ------------------------------------------------------------------------------------------------------------------  Chemistries  Recent Labs  Lab 03/07/18 1801  NA 136  K 4.5  CL 99*  CO2 25  GLUCOSE 432*  BUN 37*  CREATININE 1.47*  CALCIUM 8.9  AST 22  ALT 12*  ALKPHOS 55  BILITOT 0.2*   ------------------------------------------------------------------------------------------------------------------  ------------------------------------------------------------------------------------------------------------------ GFR: Estimated Creatinine Clearance: 21.4 mL/min (A) (by C-G formula based on SCr of 1.47 mg/dL (H)). Liver Function Tests: Recent Labs  Lab 03/07/18 1801  AST 22  ALT 12*  ALKPHOS 55  BILITOT 0.2*  PROT 6.4*  ALBUMIN 2.4*    --------------------------------------------------------------------------------------------------------------- Urine analysis:    Component Value Date/Time   COLORURINE YELLOW 03/07/2018 1801   APPEARANCEUR CLOUDY (A) 03/07/2018 1801   LABSPEC 1.017 03/07/2018 1801   PHURINE 5.0 03/07/2018 1801   GLUCOSEU >=500 (A) 03/07/2018 1801   HGBUR NEGATIVE 03/07/2018 1801   BILIRUBINUR NEGATIVE 03/07/2018 1801   KETONESUR NEGATIVE 03/07/2018 1801   PROTEINUR 30 (A) 03/07/2018 1801   UROBILINOGEN 0.2 06/30/2015 1317   NITRITE NEGATIVE 03/07/2018 1801   LEUKOCYTESUR MODERATE (A) 03/07/2018 1801      Imaging Results:    Dg Chest Port 1 View  Result Date: 03/07/2018 CLINICAL DATA:  Productive cough. EXAM: PORTABLE CHEST 1 VIEW COMPARISON:  Radiograph 02/11/2018, additional priors FINDINGS: Exaggerated kyphosis with patient's chin obscuring the left lung apex, similar to prior exams, positioning limits assessment. Elevated left hemidiaphragm with opacity at the left lung base, new. Heart size and  mediastinal contours are unchanged allowing for differences in positioning. Mild vascular congestion without pulmonary edema. No pneumothorax. Limited assessment for pleural effusion.  IMPRESSION: Left basilar opacity, in the setting of volume loss in the left hemithorax may be aspiration or atelectasis/collapse. Pneumonia is also considered. Recommend radiographic follow-up to resolution. Electronically Signed   By: Jeb Levering M.D.   On: 03/07/2018 19:36    My personal review of EKG: Rhythm NSR,LVH   Assessment & Plan:    Active Problems:   Rheumatoid arthritis ([place])   UTI (urinary tract infection)   Pressure ulcer   Dehydration   Hyperglycemia   HCAP (healthcare-associated pneumonia)   Decubitus ulcer of sacral region, stage 2   1. Healthcare associated pneumonia-patient presented with lethargy, concern for aspiration pneumonia.  Started on vancomycin and Zosyn.  Will obtain blood cultures x2. 2. ?  Aspiration/dysphagia-patient has chronic neck deformity due to rheumatoid arthritis, high risk for aspiration.  Will obtain swallow evaluation.  We will keep patient n.p.o. 3. UTI-patient has a normal UA, will obtain urine culture.  Based on previous culture E. coli was resistant to ciprofloxacin.  Will start patient on Zosyn.  Follow urine culture results 4. Diabetes mellitus, type II, uncontrolled-start Lantus 5 units subcu daily, sliding scale insulin with NovoLog.  Hold metformin 5. Decubitus ulcer-patient has stage II decubitus ulcer on sacrum, will obtain wound care consultation in a.m. 6. Acute kidney injury-patient's baseline creatinine was 0.84, today creatinine is 1.47 likely from poor p.o. intake.  Start IV normal saline.  Follow BMP in am. 7. Rheumatoid arthritis-patient is chronically bedbound, has contractures in all extremities.  Contracture in the cervical spine.  Continue prednisone      DVT Prophylaxis-   Lovenox   AM Labs Ordered, also please review Full  Orders  Family Communication: Admission, patients condition and plan of care including tests being ordered have been discussed with the patient her daughter was at bedside who indicate understanding and agree with the plan and Code Status.  Code Status: Full code Admission status: Inpatient  Time spent in minutes :  60 minutes   Oswald Hillock M.D on 03/07/2018 at 9:48 PM  Between 7am to 7pm - Pager - [phone]. After 7pm go to [email or web address] - password [facility]  [facility] - Office  [phone]

## 2018-03-07 NOTE — ED Notes (Signed)
CBG 403 

## 2018-03-07 NOTE — ED Triage Notes (Signed)
Pt with productive cough in triage.

## 2018-03-07 NOTE — ED Triage Notes (Signed)
Pt with hyperglycemia 355 @ 1508 and 416 @ 1559 today, family reports that her urine has been cloudy today. Pt on Metformin and insulin.

## 2018-03-08 DIAGNOSIS — E860 Dehydration: Secondary | ICD-10-CM

## 2018-03-08 LAB — COMPREHENSIVE METABOLIC PANEL
ALBUMIN: 2 g/dL — AB (ref 3.5–5.0)
ALT: 10 U/L — AB (ref 14–54)
AST: 17 U/L (ref 15–41)
Alkaline Phosphatase: 47 U/L (ref 38–126)
Anion gap: 6 (ref 5–15)
BUN: 30 mg/dL — ABNORMAL HIGH (ref 6–20)
CALCIUM: 8 mg/dL — AB (ref 8.9–10.3)
CO2: 29 mmol/L (ref 22–32)
CREATININE: 1.18 mg/dL — AB (ref 0.44–1.00)
Chloride: 103 mmol/L (ref 101–111)
GFR calc Af Amer: 46 mL/min — ABNORMAL LOW (ref >60)
GFR calc non Af Amer: 40 mL/min — ABNORMAL LOW (ref >60)
GLUCOSE: 153 mg/dL — AB (ref 65–99)
Potassium: 4.1 mmol/L (ref 3.5–5.1)
Sodium: 138 mmol/L (ref 135–145)
Total Bilirubin: 0.4 mg/dL (ref 0.3–1.2)
Total Protein: 5.4 g/dL — ABNORMAL LOW (ref 6.5–8.1)

## 2018-03-08 LAB — CBC
HCT: 26 % — ABNORMAL LOW (ref 36.0–46.0)
HEMOGLOBIN: 7.7 g/dL — AB (ref 12.0–15.0)
MCH: 27.6 pg (ref 26.0–34.0)
MCHC: 29.6 g/dL — ABNORMAL LOW (ref 30.0–36.0)
MCV: 93.2 fL (ref 78.0–100.0)
Platelets: 285 10*3/uL (ref 150–400)
RBC: 2.79 MIL/uL — AB (ref 3.87–5.11)
RDW: 14.7 % (ref 11.5–15.5)
WBC: 12.6 10*3/uL — ABNORMAL HIGH (ref 4.0–10.5)

## 2018-03-08 LAB — GLUCOSE, CAPILLARY
GLUCOSE-CAPILLARY: 140 mg/dL — AB (ref 65–99)
Glucose-Capillary: 139 mg/dL — ABNORMAL HIGH (ref 65–99)
Glucose-Capillary: 191 mg/dL — ABNORMAL HIGH (ref 65–99)
Glucose-Capillary: 294 mg/dL — ABNORMAL HIGH (ref 65–99)
Glucose-Capillary: 403 mg/dL — ABNORMAL HIGH (ref 65–99)

## 2018-03-08 LAB — HEMOGLOBIN A1C
HEMOGLOBIN A1C: 7 % — AB (ref 4.8–5.6)
MEAN PLASMA GLUCOSE: 154.2 mg/dL

## 2018-03-08 LAB — STREP PNEUMONIAE URINARY ANTIGEN: Strep Pneumo Urinary Antigen: NEGATIVE

## 2018-03-08 LAB — MRSA PCR SCREENING: MRSA by PCR: NEGATIVE

## 2018-03-08 MED ORDER — GERHARDT'S BUTT CREAM
TOPICAL_CREAM | Freq: Three times a day (TID) | CUTANEOUS | Status: DC
  Administered 2018-03-08: 1 via TOPICAL
  Administered 2018-03-08 (×2): via TOPICAL
  Administered 2018-03-09: 1 via TOPICAL
  Administered 2018-03-09 – 2018-03-11 (×6): via TOPICAL
  Filled 2018-03-08: qty 1

## 2018-03-08 MED ORDER — IPRATROPIUM-ALBUTEROL 0.5-2.5 (3) MG/3ML IN SOLN
3.0000 mL | Freq: Two times a day (BID) | RESPIRATORY_TRACT | Status: DC
  Administered 2018-03-08 – 2018-03-11 (×6): 3 mL via RESPIRATORY_TRACT
  Filled 2018-03-08 (×5): qty 3

## 2018-03-08 NOTE — Progress Notes (Signed)
PROGRESS NOTE    [name]  ZOX:[id number] DOB: [birth year] DOA: 03/07/2018 PCP: Moshe Cipro, MD    Brief Narrative:  84 year old female with multiple medical problems, admitted to the hospital with lethargy, weakness.  Found to have possible urinary tract infection as well as pneumonia.  Started on intravenous antibiotics.  She was noted to be dehydrated with hyperglycemia.  She is receiving IV fluids.   Assessment & Plan:   Active Problems:   Rheumatoid arthritis ([place])   UTI (urinary tract infection)   Pressure ulcer   Dehydration   Hyperglycemia   HCAP (healthcare-associated pneumonia)   Decubitus ulcer of sacral region, stage 2   1. Pneumonia, healthcare associated pneumonia versus aspiration pneumonia.  She is been started on antibiotics with vancomycin and Zosyn.  Since MRSA PCR is negative, will discontinue further Zosyn.  Blood cultures have been sent.  Speech therapy has been consulted to assess swallowing. 2. Urinary tract infection.  Recently had E. coli urinary tract infection with antibiotic resistance.  Repeat urine culture has been sent.  Urinalysis indicates persistent infection. 3. Diabetes.  Patient was admitted with hyperglycemia.  She has been started on Lantus and sliding scale insulin.  Metformin currently on hold.  Blood sugars improving. 4. Acute kidney injury.  Related to dehydration.  Improving with IV fluids. 5. Rheumatoid arthritis.  Chronically on steroids.  Continue on home dose of prednisone. 6. Decubitus ulcer.  Patient has unstageable decubitus ulcer on upper back that was present on admission.  Wound care has evaluated the patient. 7. History of SVT.  Continue on metoprolol and diltiazem.  Heart rate is currently stable. 8. Severe protein calorie malnutrition.  Likely related to decreased p.o. intake.  Nutrition following   DVT prophylaxis: Lovenox Code Status: Full code Family Communication: Discussed with daughters at the  bedside Disposition Plan: Discharge home once improved   Consultants:     Procedures:     Antimicrobials:   Vancomycin 6/2 > 6/3  Zosyn 6/2 >   Subjective: She is having a productive cough.  Otherwise denies any complaints.  Objective: Vitals:   03/08/18 1039 03/08/18 1052 03/08/18 1233 03/08/18 1600  BP: (!) 105/39  (!) 95/40   Pulse: 80  72   Resp:   18   Temp:   97.6 F (36.4 C) 98.2 F (36.8 C)  TempSrc:   Oral Oral  SpO2:  99% 100%   Weight:      Height:        Intake/Output Summary (Last 24 hours) at 03/08/2018 1811 Last data filed at 03/08/2018 0550 Gross per 24 hour  Intake 781.25 ml  Output 100 ml  Net 681.25 ml   Filed Weights   03/07/18 1754 03/08/18 0005 03/08/18 0500  Weight: 52.2 kg (115 lb) 43.1 kg (95 lb 0.3 oz) 43.1 kg (95 lb 0.3 oz)    Examination:  General exam: Appears calm and comfortable  Respiratory system: Clear to auscultation. Respiratory effort normal. Cardiovascular system: S1 & S2 heard, RRR. No JVD, murmurs, rubs, gallops or clicks.  Gastrointestinal system: Abdomen is nondistended, soft and nontender. No organomegaly or masses felt. Normal bowel sounds heard. Central nervous system:  No focal neurological deficits. Extremities: No edema bilaterally Skin: No rashes, lesions or ulcers Psychiatry: Confused, pleasant    Data Reviewed: I have personally reviewed following labs and imaging studies  CBC: Recent Labs  Lab 03/07/18 1801 03/08/18 0509  WBC 17.1* 12.6*  NEUTROABS 14.6*  --   HGB 8.9* 7.7*  HCT 29.9* 26.0*  MCV 94.0 93.2  PLT 306 341   Basic Metabolic Panel: Recent Labs  Lab 03/07/18 1801 03/08/18 0509  NA 136 138  K 4.5 4.1  CL 99* 103  CO2 25 29  GLUCOSE 432* 153*  BUN 37* 30*  CREATININE 1.47* 1.18*  CALCIUM 8.9 8.0*   GFR: Estimated Creatinine Clearance: 22 mL/min (A) (by C-G formula based on SCr of 1.18 mg/dL (H)). Liver Function Tests: Recent Labs  Lab 03/07/18 1801 03/08/18 0509   AST 22 17  ALT 12* 10*  ALKPHOS 55 47  BILITOT 0.2* 0.4  PROT 6.4* 5.4*  ALBUMIN 2.4* 2.0*   No results for input(s): LIPASE, AMYLASE in the last 168 hours. No results for input(s): AMMONIA in the last 168 hours. Coagulation Profile: No results for input(s): INR, PROTIME in the last 168 hours. Cardiac Enzymes: No results for input(s): CKTOTAL, CKMB, CKMBINDEX, TROPONINI in the last 168 hours. BNP (last 3 results) No results for input(s): PROBNP in the last 8760 hours. HbA1C: No results for input(s): HGBA1C in the last 72 hours. CBG: Recent Labs  Lab 03/07/18 2155 03/07/18 2359 03/08/18 0607 03/08/18 1128 03/08/18 1737  GLUCAP 300* 294* 139* 191* 140*   Lipid Profile: No results for input(s): CHOL, HDL, LDLCALC, TRIG, CHOLHDL, LDLDIRECT in the last 72 hours. Thyroid Function Tests: No results for input(s): TSH, T4TOTAL, FREET4, T3FREE, THYROIDAB in the last 72 hours. Anemia Panel: No results for input(s): VITAMINB12, FOLATE, FERRITIN, TIBC, IRON, RETICCTPCT in the last 72 hours. Sepsis Labs: No results for input(s): PROCALCITON, LATICACIDVEN in the last 168 hours.  Recent Results (from the past 240 hour(s))  MRSA PCR Screening     Status: None   Collection Time: 03/07/18 11:55 PM  Result Value Ref Range Status   MRSA by PCR NEGATIVE NEGATIVE Final    Comment:        The GeneXpert MRSA Assay (FDA approved for NASAL specimens only), is one component of a comprehensive MRSA colonization surveillance program. It is not intended to diagnose MRSA infection nor to guide or monitor treatment for MRSA infections. Performed at [facility], [address], [place], [place] [address]   Culture, blood (Routine X 2) w Reflex to ID Panel     Status: None (Preliminary result)   Collection Time: 03/08/18  5:09 AM  Result Value Ref Range Status   Specimen Description BLOOD LEFT HAND  Final   Special Requests   Final    BOTTLES DRAWN AEROBIC AND ANAEROBIC Blood Culture  adequate volume   Culture   Final    NO GROWTH < 12 HOURS Performed at [facility], [address], [place], [place] [address]    Report Status PENDING  Incomplete  Culture, blood (Routine X 2) w Reflex to ID Panel     Status: None (Preliminary result)   Collection Time: 03/08/18  6:55 AM  Result Value Ref Range Status   Specimen Description BLOOD RIGHT ARM  Final   Special Requests   Final    BOTTLES DRAWN AEROBIC AND ANAEROBIC Blood Culture adequate volume Performed at [facility], [address], [place], [place] [address]    Culture PENDING  Incomplete   Report Status PENDING  Incomplete         Radiology Studies: Dg Chest Port 1 View  Result Date: 03/07/2018 CLINICAL DATA:  Productive cough. EXAM: PORTABLE CHEST 1 VIEW COMPARISON:  Radiograph 02/11/2018, additional priors FINDINGS: Exaggerated kyphosis with patient's chin obscuring the left lung  apex, similar to prior exams, positioning limits assessment. Elevated left hemidiaphragm with opacity at the left lung base, new. Heart size and mediastinal contours are unchanged allowing for differences in positioning. Mild vascular congestion without pulmonary edema. No pneumothorax. Limited assessment for pleural effusion. IMPRESSION: Left basilar opacity, in the setting of volume loss in the left hemithorax may be aspiration or atelectasis/collapse. Pneumonia is also considered. Recommend radiographic follow-up to resolution. Electronically Signed   By: Jeb Levering M.D.   On: 03/07/2018 19:36        Scheduled Meds: . aspirin EC  81 mg Oral BID  . atorvastatin  10 mg Oral QPM  . diltiazem  120 mg Oral Daily  . enoxaparin (LOVENOX) injection  30 mg Subcutaneous Q24H  . Gerhardt's butt cream   Topical TID  . insulin aspart  0-9 Units Subcutaneous Q6H  . insulin glargine  5 Units Subcutaneous Daily  . ipratropium-albuterol  3 mL Nebulization BID  . metoprolol succinate  12.5 mg Oral Daily  . mirtazapine  7.5 mg Oral QHS  .  mometasone-formoterol  2 puff Inhalation BID  . multivitamin with minerals   Oral q morning - 10a  . pantoprazole  40 mg Oral Daily  . predniSONE  5 mg Oral Q breakfast  . raloxifene  60 mg Oral Daily   Continuous Infusions: . sodium chloride 75 mL/hr at 03/08/18 1300  . piperacillin-tazobactam (ZOSYN)  IV 3.375 g (03/08/18 1332)     LOS: 1 day    Time spent: 59mins    Kathie Dike, MD [facility] Pager [phone]  If 7PM-7AM, please contact night-coverage [email or web address] Password [facility] 03/08/2018, 6:11 PM

## 2018-03-08 NOTE — Evaluation (Signed)
Clinical/Bedside Swallow Evaluation Patient Details  Name: [name] MRN: [id number] Date of Birth: 12-09-1925  Today's Date: 03/08/2018 Time: SLP Start Time (ACUTE ONLY): 8938 SLP Stop Time (ACUTE ONLY): 1824 SLP Time Calculation (min) (ACUTE ONLY): 32 min  Past Medical History:  Past Medical History:  Diagnosis Date  . Acid reflux   . Anemia   . Arthritis    ra  . Asthma   . Cancer [facility])    breast cancer  . Coronary artery disease   . Diabetes mellitus without complication ([place])   . Diverticulitis   . Gastric ulcer   . Hyperlipidemia   . Hypertension   . Hypoxemia   . Kyphosis   . Kyphosis   . Mitral valve disorder   . Osteoporosis   . Oxygen dependent    2 liters at night  . Raynaud disease   . Thrombocytopenia ([place])    Past Surgical History:  Past Surgical History:  Procedure Laterality Date  . BREAST SURGERY    . HERNIA REPAIR    . MASTECTOMY Right 1978  . SIMPLE MASTECTOMY WITH AXILLARY SENTINEL NODE BIOPSY Left 05/09/2015   Procedure: LEFT SIMPLE MASTECTOMY;  Surgeon: Erroll Luna, MD;  Location: [facility];  Service: General;  Laterality: Left;   HPI:  [name] a89 y.o.female,with history of rheumatoid arthritis, breast cancer, coronary disease, diabetes mellitus without complication, hypertension, hyperlipidemia who was recently discharged from the hospital on 02/12/2018 after she was treated for UTI. Patient was discharged on Levaquin however urine culture grew E. coli which was resistant to ciprofloxacin. Urine culture was supposed to be followed up as outpatient. Chest x-ray shows: Left basilar opacity, in the setting of volume loss in the left hemithorax may be aspiration or atelectasis/collapse. Pt had BSE during acute stay last year with recommendation for regular textures (family to modify as needed as they are present for meals) and thin liquids. BSE requested.    Assessment / Plan / Recommendation Clinical Impression  Pt seen at bedside with  family present. Daughters provided background information. Pt is cared for at home by her daughters. She is assisted to the table for all meals and does not eat/drink in bed at home. Oral motor examination reveals white coating on dorsum of tongue, however Pt denies discomfort. Family reports that Pt has been taking ABX and prednisone. Although Pt is edentulous, she generally consumes some regular and mostly soft textures at home with assist from family for prepping and feeding. Pt assessed with ice chips, thin water via straw sips, puree, and regular textures. Pt with one delayed cough which was elicited following graham crackers. Pt is at risk for aspiration given advanced age, dependent feeder status, decreased mobility, and decreased respiratory status. This was reviewed with pt/family and they verbally acknowledge understanding. Family would like to continue receiving regular textures for pt, but with pureed meats. Daughters are present for all of pt's meals and modify as needed for intake. Her family reports that Pt appears to be feeling much better as she was much more interactive and requested that family style her hair before the preacher arrives. Will order regular textures with pureed meats and thin liquids; PO medication whole in puree. No further SLP services indicated at this time. Reconsult as indicated.  SLP Visit Diagnosis: Dysphagia, unspecified (R13.10)    Aspiration Risk  Mild aspiration risk    Diet Recommendation Regular;Thin liquid;Dysphagia 1 (Puree)(puree meats)   Liquid Administration via: Straw Medication Administration: Whole meds with puree Supervision: Staff to  assist with self feeding;Full supervision/cueing for compensatory strategies(family will feed Pt) Compensations: Slow rate;Small sips/bites;Lingual sweep for clearance of pocketing Postural Changes: Seated upright at 90 degrees;Remain upright for at least 30 minutes after po intake    Other  Recommendations Oral  Care Recommendations: Oral care BID;Staff/trained caregiver to provide oral care Other Recommendations: Clarify dietary restrictions   Follow up Recommendations 24 hour supervision/assistance      Frequency and Duration            Prognosis Prognosis for Safe Diet Advancement: [place] Date of Onset: 03/07/18 HPI: [name] a89 y.o.female,with history of rheumatoid arthritis, breast cancer, coronary disease, diabetes mellitus without complication, hypertension, hyperlipidemia who was recently discharged from the hospital on 02/12/2018 after she was treated for UTI. Patient was discharged on Levaquin however urine culture grew E. coli which was resistant to ciprofloxacin. Urine culture was supposed to be followed up as outpatient. Chest x-ray shows: Left basilar opacity, in the setting of volume loss in the left hemithorax may be aspiration or atelectasis/collapse. Pt had BSE during acute stay last year with recommendation for regular textures (family to modify as needed as they are present for meals) and thin liquids. BSE requested.  Type of Study: Bedside Swallow Evaluation Previous Swallow Assessment: June 2018 reg/thin Diet Prior to this Study: NPO Temperature Spikes Noted: No Respiratory Status: Nasal cannula History of Recent Intubation: No Behavior/Cognition: Alert;Cooperative;Pleasant mood Oral Cavity Assessment: Other (comment)(white lingual coating; Pt has been on ABX and steroids) Oral Care Completed by SLP: Recent completion by staff Oral Cavity - Dentition: Edentulous(wears dentures to church only) Vision: Functional for self-feeding Self-Feeding Abilities: Total assist Patient Positioning: Upright in bed(difficult to position due to kyphosis) Baseline Vocal Quality: Normal Volitional Cough: Weak Volitional Swallow: Able to elicit    Oral/Motor/Sensory Function Overall Oral Motor/Sensory Function: Within functional limits   Ice  Chips Ice chips: Within functional limits Presentation: Spoon   Thin Liquid Thin Liquid: Within functional limits Presentation: Straw    Nectar Thick Nectar Thick Liquid: Not tested   Honey Thick Honey Thick Liquid: Not tested   Puree Puree: Within functional limits Presentation: Spoon   Solid   GO   Solid: Impaired Presentation: (presented small pieces of cracker) Oral Phase Impairments: Impaired mastication Oral Phase Functional Implications: Prolonged oral transit Pharyngeal Phase Impairments: Cough - Delayed Other Comments: Pt allows cracker to soften in mouth prior to mastication       Thank you,  Genene Churn, [place]  PORTER,DABNEY 03/08/2018,6:37 PM

## 2018-03-08 NOTE — Consult Note (Signed)
[place] Nurse wound consult note Reason for Consult: Patient is known to our team from previous consults. 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 Seen for the first time at an outpatient [facility] in [place] last Wednesday, has [place]. Wound type: Pressure, Moisture Pressure Injury POA: Yes  Measurement: Sacral/coccygeal area: Scattered open areas from IAD within a 15cm x 9cm area, the largest of which measures 2cm x 1.5cm x 0.1cm. There is a deep purple area of tissue discoloration to the left of the coccyx-deep tissue pressure injury (DTPI) measuring 3cm x 3.5cm). Wound bed is red to deep red. Vaginal area with white discharge consistent with yeast overgrowth (candidiasis). Right Mid Thoracic Stage 4 wound:  3.6cm x 2.6cm x 0.5cm with undermining from 4 o'clock to 2 o'clock measuring 1.5cm - 2cm.  The deepest area of undermining is at 10 o'clock. Wound bed is red, moist with tendon evident in wound base.  Wound bed: As described above Drainage (amount, consistency, odor) Scant serous from sacral/coccygeal area. Moderate amount of serous exudate from midthoracic wound Periwound: Intact, dry. Dressing procedure/placement/frequency: Patient is on a mattress replacement with low air loss feature, she does not have this therapy at home, but daughters (2) tell me that the outpatient [facility] staff was going to get her one.  Immediate goal is to impact IAD; we will remove the prophylactic foam dressing in favor of TID and PRN application of Gerhardt's Butt Cream, a 1:1:1 compounded prescriptive of Zinc oxide, hydrocortisone and lotrimin cream. Turning side to side per house protocol is in place, [place] is at or below a 30 degree angle. She is on DermaTherapy linen and are limiting the number of under pads beneath patient and on top of air mattress therapy, we will remove adult briefs.  The powered external female urinary  incontinence collection device (PurWick) is in place.  Right thoracic Stage 4 Wound with visible tendon in wound bed:  We will obliterate the dead space with a silver hydrofiber, initially changing it daily and perhaps at home changing it three times weekly.   Nutritional status continues to be poor and this dramatically impacts the ability of the host to heal. If you agree, please order Dietician Consult.   Patient with indications of fungal overgrowth in the periwound area and some white discharge from vagina consistent with candidiasis.  If you agree, please consider system antifungal (e.g., diflucan)  There is no breakdown on the bilateral feet or heels. I have provided Prevalon pressure redistribution heel boots as a preventive measure.  [place] nursing team will not follow, but will remain available to this patient, the nursing and medical teams.  Please re-consult if needed. Thanks, Maudie Flakes, MSN, RN, [place], Arther Abbott  Pager# [phone]

## 2018-03-08 NOTE — Progress Notes (Signed)
Initial Nutrition Assessment  DOCUMENTATION CODES:      INTERVENTION:  When patient diet is advanced RD will start appropriate oral supplements:  -1 packet Juven BID, each packet provides 80 calories, 8 grams of carbohydrate, and 14 grams of amino acids; supplement contains CaHMB, glutamine, and arginine, to promote wound healing   -Ensure Enlive po BID, each supplement provides 350 kcal and 20 grams of protein   NUTRITION DIAGNOSIS:   Increased nutrient needs related to wound healing as evidenced by estimated needs.   GOAL:   Patient will meet greater than or equal to 90% of their needs   MONITOR:   Diet advancement, Supplement acceptance, PO intake, Skin, Labs, Weight trends  REASON FOR ASSESSMENT:   Low [place]    ASSESSMENT: Patient is a bedbound 84 yo who has a hx of gastric ulcer, acid reflux, CAD, HLD, breast cancer (distant mastectomy right breast-'78 and simple mastectomy left breast 8/16), kyphosis, Raynaud dz, diverticulitis, anemia, arthritis.   At home pt has bee eating less and daughters have been feeding her more often to try and compensate for this. At breakfast she likes oatmeal, banana, applesauce, yogurt (soft) foods. The daughters have been giving her [place] yogurt due to higher protein content and they have been buying Ensure MAX high protein supplement. They report she drinks 2 daily. She also likes tea, coffee, lemonade and Boost.   Patient is NPO currently. Nursing is concerned about swallow problem. The daughters are here and says pt has been holding food in her mouth and has been taking in less oral intake than previously. Her weight appears to be down but question if change is a severe as hospital records would suggest.  According to hospital documentation patient weight has decreased 17% in < 1 month. Previously she has been weighing between 101-107 lbs primarily.  Labs reviewed: Hemoglobin 7.7 (low), WBC's-12.6 (high) BMP Latest Ref Rng & Units  03/08/2018 03/07/2018 02/26/2018  Glucose 65 - 99 mg/dL 153(H) 432(H) 200(H)  BUN 6 - 20 mg/dL 30(H) 37(H) 20  Creatinine 0.44 - 1.00 mg/dL 1.18(H) 1.47(H) 1.03(H)  Sodium 135 - 145 mmol/L 138 136 135  Potassium 3.5 - 5.1 mmol/L 4.1 4.5 4.5  Chloride 101 - 111 mmol/L 103 99(L) 96(L)  CO2 22 - 32 mmol/L 29 25 30   Calcium 8.9 - 10.3 mg/dL 8.0(L) 8.9 8.9    Scheduled Meds: . aspirin EC  81 mg Oral BID  . atorvastatin  10 mg Oral QPM  . diltiazem  120 mg Oral Daily  . enoxaparin (LOVENOX) injection  30 mg Subcutaneous Q24H  . Gerhardt's butt cream   Topical TID  . insulin aspart  0-9 Units Subcutaneous Q6H  . insulin glargine  5 Units Subcutaneous Daily  . ipratropium-albuterol  3 mL Nebulization BID  . metoprolol succinate  12.5 mg Oral Daily  . mirtazapine  7.5 mg Oral QHS  . mometasone-formoterol  2 puff Inhalation BID  . multivitamin with minerals   Oral q morning - 10a  . pantoprazole  40 mg Oral Daily  . predniSONE  5 mg Oral Q breakfast  . raloxifene  60 mg Oral Daily   Past Medical History:  Diagnosis Date  . Acid reflux   . Anemia   . Arthritis    ra  . Asthma   . Cancer [facility])    breast cancer  . Coronary artery disease   . Diabetes mellitus without complication ([place])   . Diverticulitis   . Gastric ulcer   .  Hyperlipidemia   . Hypertension   . Hypoxemia   . Kyphosis   . Kyphosis   . Mitral valve disorder   . Osteoporosis   . Oxygen dependent    2 liters at night  . Raynaud disease   . Thrombocytopenia ([place])      NUTRITION - FOCUSED PHYSICAL EXAM: Difficult to assess due to pt contracted state  Diet Order:   Diet Order           Diet NPO time specified  Diet effective now          EDUCATION NEEDS:  No education needs have been identified at this time   Skin:  Skin Assessment: Skin Integrity Issues:(See WOC note for details) Skin Integrity Issues:: Stage IV, Stage II(severe MASD as well as muliple areas of skin breakdown)  Last BM:   03/06/18  Height:   Ht Readings from Last 1 Encounters:  03/08/18 5\' 3"  (1.6 m)    Weight:   Wt Readings from Last 1 Encounters:  03/08/18 95 lb 0.3 oz (43.1 kg)    Ideal Body Weight:  52 kg  BMI:  Body mass index is 16.83 kg/m.  Estimated Nutritional Needs:   Kcal:  2956-2130 (35-38 kcal/kg/bw)  Protein:  60-65 gr  (1.4-1.5 gr/kg/bw)  Fluid:  >1300 ml daily   Colman Cater MS,RD,CSG,LDN Office: [phone] Pager: [phone]

## 2018-03-09 LAB — BASIC METABOLIC PANEL
Anion gap: 5 (ref 5–15)
BUN: 21 mg/dL — AB (ref 6–20)
CALCIUM: 8.1 mg/dL — AB (ref 8.9–10.3)
CHLORIDE: 108 mmol/L (ref 101–111)
CO2: 30 mmol/L (ref 22–32)
CREATININE: 0.9 mg/dL (ref 0.44–1.00)
GFR calc non Af Amer: 55 mL/min — ABNORMAL LOW (ref >60)
GLUCOSE: 137 mg/dL — AB (ref 65–99)
Potassium: 4.1 mmol/L (ref 3.5–5.1)
Sodium: 143 mmol/L (ref 135–145)

## 2018-03-09 LAB — GLUCOSE, CAPILLARY
GLUCOSE-CAPILLARY: 159 mg/dL — AB (ref 65–99)
GLUCOSE-CAPILLARY: 295 mg/dL — AB (ref 65–99)
Glucose-Capillary: 131 mg/dL — ABNORMAL HIGH (ref 65–99)
Glucose-Capillary: 149 mg/dL — ABNORMAL HIGH (ref 65–99)
Glucose-Capillary: 265 mg/dL — ABNORMAL HIGH (ref 65–99)

## 2018-03-09 LAB — CBC
HCT: 29.8 % — ABNORMAL LOW (ref 36.0–46.0)
Hemoglobin: 8.6 g/dL — ABNORMAL LOW (ref 12.0–15.0)
MCH: 27.9 pg (ref 26.0–34.0)
MCHC: 28.9 g/dL — AB (ref 30.0–36.0)
MCV: 96.8 fL (ref 78.0–100.0)
PLATELETS: 239 10*3/uL (ref 150–400)
RBC: 3.08 MIL/uL — ABNORMAL LOW (ref 3.87–5.11)
RDW: 14.6 % (ref 11.5–15.5)
WBC: 12.9 10*3/uL — ABNORMAL HIGH (ref 4.0–10.5)

## 2018-03-09 MED ORDER — GUAIFENESIN-DM 100-10 MG/5ML PO SYRP
5.0000 mL | ORAL_SOLUTION | ORAL | Status: DC | PRN
  Administered 2018-03-09: 5 mL via ORAL
  Filled 2018-03-09: qty 5

## 2018-03-09 MED ORDER — BENZONATATE 100 MG PO CAPS
100.0000 mg | ORAL_CAPSULE | Freq: Three times a day (TID) | ORAL | Status: DC
  Administered 2018-03-09 – 2018-03-11 (×5): 100 mg via ORAL
  Filled 2018-03-09 (×5): qty 1

## 2018-03-09 NOTE — Progress Notes (Signed)
PROGRESS NOTE    [name]  EML:[id number] DOB: 1926/07/04 DOA: 03/07/2018 PCP: Moshe Cipro, MD    Brief Narrative:  84 year old female with multiple medical problems, admitted to the hospital with lethargy, weakness.  Found to have possible urinary tract infection as well as pneumonia.  Started on intravenous antibiotics.  She was noted to be dehydrated with hyperglycemia.  She is receiving IV fluids.   Assessment & Plan:   Active Problems:   Rheumatoid arthritis ([place])   UTI (urinary tract infection)   Pressure ulcer   Dehydration   Hyperglycemia   HCAP (healthcare-associated pneumonia)   Decubitus ulcer of sacral region, stage 2   1. Pneumonia, healthcare associated pneumonia versus aspiration pneumonia.  She is been started on antibiotics with vancomycin and Zosyn.  Since MRSA PCR is negative, will discontinue further vancomycin.  Blood cultures have not showing any growth.  Seen by speech therapy with recommendations for regular diet with thin liquids except pured meats. 2. Urinary tract infection.  Recently had E. coli urinary tract infection with antibiotic resistance.  Repeat urine culture in process.  Urinalysis indicates persistent infection.  Would continue on Zosyn until further identification of bacteria and antibiotic sensitivities have been identified to ensure patient is discharged on appropriate antibiotics 3. Diabetes.  Patient was admitted with hyperglycemia.  She has been started on Lantus and sliding scale insulin.  Metformin currently on hold.  Blood sugars stable. 4. Acute kidney injury.  Related to dehydration.  Resolved with IV fluids. 5. Rheumatoid arthritis.  Chronically on steroids.  Continue on home dose of prednisone. 6. Decubitus ulcer.  Patient has unstageable decubitus ulcer on upper back that was present on admission.  Wound care has evaluated the patient. 7. History of SVT.  Continue on metoprolol and diltiazem.  Heart rate is currently  stable. 8. Severe protein calorie malnutrition.  Likely related to decreased p.o. intake.  Nutrition following   DVT prophylaxis: Lovenox Code Status: Full code Family Communication: Discussed with daughters at the bedside Disposition Plan: Discharge home once improved   Consultants:     Procedures:     Antimicrobials:   Vancomycin 6/2 > 6/3  Zosyn 6/2 >   Subjective: Denies any shortness of breath. She does have a productive cough  Objective: Vitals:   03/09/18 0536 03/09/18 0752 03/09/18 0755 03/09/18 1352  BP: 123/70   (!) 98/50  Pulse: 85   78  Resp: 18   18  Temp: 98 F (36.7 C)   98.3 F (36.8 C)  TempSrc: Oral   Oral  SpO2:  95% 95% 100%  Weight:      Height:        Intake/Output Summary (Last 24 hours) at 03/09/2018 1807 Last data filed at 03/09/2018 1029 Gross per 24 hour  Intake 200 ml  Output -  Net 200 ml   Filed Weights   03/07/18 1754 03/08/18 0005 03/08/18 0500  Weight: 52.2 kg (115 lb) 43.1 kg (95 lb 0.3 oz) 43.1 kg (95 lb 0.3 oz)    Examination:  General exam: Alert, awake, no distress Respiratory system: Clear to auscultation. Respiratory effort normal. Cardiovascular system:RRR. No murmurs, rubs, gallops. Gastrointestinal system: Abdomen is nondistended, soft and nontender. No organomegaly or masses felt. Normal bowel sounds heard. Central nervous system: No focal neurological deficits. Extremities: contractures in all extremities, no edema Skin: No rashes, lesions or ulcers Psychiatry: confused, pleasant     Data Reviewed: I have personally reviewed following labs and imaging studies  CBC:  Recent Labs  Lab 03/07/18 1801 03/08/18 0509 03/09/18 0604  WBC 17.1* 12.6* 12.9*  NEUTROABS 14.6*  --   --   HGB 8.9* 7.7* 8.6*  HCT 29.9* 26.0* 29.8*  MCV 94.0 93.2 96.8  PLT 306 285 024   Basic Metabolic Panel: Recent Labs  Lab 03/07/18 1801 03/08/18 0509 03/09/18 0604  NA 136 138 143  K 4.5 4.1 4.1  CL 99* 103 108  CO2  25 29 30   GLUCOSE 432* 153* 137*  BUN 37* 30* 21*  CREATININE 1.47* 1.18* 0.90  CALCIUM 8.9 8.0* 8.1*   GFR: Estimated Creatinine Clearance: 28.3 mL/min (by C-G formula based on SCr of 0.9 mg/dL). Liver Function Tests: Recent Labs  Lab 03/07/18 1801 03/08/18 0509  AST 22 17  ALT 12* 10*  ALKPHOS 55 47  BILITOT 0.2* 0.4  PROT 6.4* 5.4*  ALBUMIN 2.4* 2.0*   No results for input(s): LIPASE, AMYLASE in the last 168 hours. No results for input(s): AMMONIA in the last 168 hours. Coagulation Profile: No results for input(s): INR, PROTIME in the last 168 hours. Cardiac Enzymes: No results for input(s): CKTOTAL, CKMB, CKMBINDEX, TROPONINI in the last 168 hours. BNP (last 3 results) No results for input(s): PROBNP in the last 8760 hours. HbA1C: Recent Labs    03/07/18 1820  HGBA1C 7.0*   CBG: Recent Labs  Lab 03/09/18 0029 03/09/18 0034 03/09/18 0539 03/09/18 1201 03/09/18 1740  GLUCAP 295* 265* 149* 131* 159*   Lipid Profile: No results for input(s): CHOL, HDL, LDLCALC, TRIG, CHOLHDL, LDLDIRECT in the last 72 hours. Thyroid Function Tests: No results for input(s): TSH, T4TOTAL, FREET4, T3FREE, THYROIDAB in the last 72 hours. Anemia Panel: No results for input(s): VITAMINB12, FOLATE, FERRITIN, TIBC, IRON, RETICCTPCT in the last 72 hours. Sepsis Labs: No results for input(s): PROCALCITON, LATICACIDVEN in the last 168 hours.  Recent Results (from the past 240 hour(s))  Culture, Urine     Status: Abnormal (Preliminary result)   Collection Time: 03/07/18  6:01 PM  Result Value Ref Range Status   Specimen Description   Final    URINE, CLEAN CATCH Performed at [facility], [address] [place], [place] [address]    Special Requests   Final    NONE Performed at [facility], [address], [place], [place] [address]    Culture >=100,000 COLONIES/mL ESCHERICHIA COLI (A)  Final   Report Status PENDING  Incomplete  MRSA PCR Screening     Status: None   Collection  Time: 03/07/18 11:55 PM  Result Value Ref Range Status   MRSA by PCR NEGATIVE NEGATIVE Final    Comment:        The GeneXpert MRSA Assay (FDA approved for NASAL specimens only), is one component of a comprehensive MRSA colonization surveillance program. It is not intended to diagnose MRSA infection nor to guide or monitor treatment for MRSA infections. Performed at [facility], [address], [place], [place] [address]   Culture, blood (Routine X 2) w Reflex to ID Panel     Status: None (Preliminary result)   Collection Time: 03/08/18  5:09 AM  Result Value Ref Range Status   Specimen Description BLOOD LEFT HAND  Final   Special Requests   Final    BOTTLES DRAWN AEROBIC AND ANAEROBIC Blood Culture adequate volume   Culture   Final    NO GROWTH 1 DAY Performed at [facility], [address], [place], [place] [address]    Report Status PENDING  Incomplete  Culture, blood (Routine X 2) w Reflex to ID Panel     Status: None (Preliminary result)   Collection Time: 03/08/18  6:55 AM  Result Value Ref Range Status   Specimen Description BLOOD RIGHT ARM  Final   Special Requests   Final    BOTTLES DRAWN AEROBIC AND ANAEROBIC Blood Culture adequate volume   Culture   Final    NO GROWTH 1 DAY Performed at [facility], [address] [place], [place] [address]    Report Status PENDING  Incomplete         Radiology Studies: Dg Chest Port 1 View  Result Date: 03/07/2018 CLINICAL DATA:  Productive cough. EXAM: PORTABLE CHEST 1 VIEW COMPARISON:  Radiograph 02/11/2018, additional priors FINDINGS: Exaggerated kyphosis with patient's chin obscuring the left lung apex, similar to prior exams, positioning limits assessment. Elevated left hemidiaphragm with opacity at the left lung base, new. Heart size and mediastinal contours are unchanged allowing for differences in positioning. Mild vascular congestion without pulmonary edema. No pneumothorax. Limited assessment for pleural effusion.  IMPRESSION: Left basilar opacity, in the setting of volume loss in the left hemithorax may be aspiration or atelectasis/collapse. Pneumonia is also considered. Recommend radiographic follow-up to resolution. Electronically Signed   By: Jeb Levering M.D.   On: 03/07/2018 19:36        Scheduled Meds: . aspirin EC  81 mg Oral BID  . atorvastatin  10 mg Oral QPM  . diltiazem  120 mg Oral Daily  . enoxaparin (LOVENOX) injection  30 mg Subcutaneous Q24H  . Gerhardt's butt cream   Topical TID  . insulin aspart  0-9 Units Subcutaneous Q6H  . insulin glargine  5 Units Subcutaneous Daily  . ipratropium-albuterol  3 mL Nebulization BID  . metoprolol succinate  12.5 mg Oral Daily  . mirtazapine  7.5 mg Oral QHS  . mometasone-formoterol  2 puff Inhalation BID  . multivitamin with minerals   Oral q morning - 10a  . pantoprazole  40 mg Oral Daily  . predniSONE  5 mg Oral Q breakfast  . raloxifene  60 mg Oral Daily   Continuous Infusions: . piperacillin-tazobactam (ZOSYN)  IV 3.375 g (03/09/18 1425)     LOS: 2 days    Time spent: 16mins    Kathie Dike, MD [facility] Pager [phone]  If 7PM-7AM, please contact night-coverage [email or web address] Password [facility] 03/09/2018, 6:07 PM

## 2018-03-09 NOTE — Plan of Care (Signed)
Pt resting in bed with eyes closed. NSD. Will continue to monitor. 

## 2018-03-10 DIAGNOSIS — J189 Pneumonia, unspecified organism: Secondary | ICD-10-CM

## 2018-03-10 LAB — BASIC METABOLIC PANEL
ANION GAP: 5 (ref 5–15)
BUN: 15 mg/dL (ref 6–20)
CO2: 29 mmol/L (ref 22–32)
Calcium: 8.4 mg/dL — ABNORMAL LOW (ref 8.9–10.3)
Chloride: 109 mmol/L (ref 101–111)
Creatinine, Ser: 1.03 mg/dL — ABNORMAL HIGH (ref 0.44–1.00)
GFR calc Af Amer: 54 mL/min — ABNORMAL LOW (ref >60)
GFR, EST NON AFRICAN AMERICAN: 46 mL/min — AB (ref >60)
GLUCOSE: 93 mg/dL (ref 65–99)
Potassium: 3.9 mmol/L (ref 3.5–5.1)
Sodium: 143 mmol/L (ref 135–145)

## 2018-03-10 LAB — CBC
HCT: 30.7 % — ABNORMAL LOW (ref 36.0–46.0)
HEMOGLOBIN: 9.1 g/dL — AB (ref 12.0–15.0)
MCH: 27.7 pg (ref 26.0–34.0)
MCHC: 29.6 g/dL — AB (ref 30.0–36.0)
MCV: 93.6 fL (ref 78.0–100.0)
Platelets: 278 10*3/uL (ref 150–400)
RBC: 3.28 MIL/uL — ABNORMAL LOW (ref 3.87–5.11)
RDW: 14.4 % (ref 11.5–15.5)
WBC: 13.6 10*3/uL — ABNORMAL HIGH (ref 4.0–10.5)

## 2018-03-10 LAB — URINE CULTURE: Culture: >=100000 — AB

## 2018-03-10 LAB — GLUCOSE, CAPILLARY
GLUCOSE-CAPILLARY: 109 mg/dL — AB (ref 65–99)
GLUCOSE-CAPILLARY: 153 mg/dL — AB (ref 65–99)
GLUCOSE-CAPILLARY: 259 mg/dL — AB (ref 65–99)
Glucose-Capillary: 91 mg/dL (ref 65–99)

## 2018-03-10 NOTE — Progress Notes (Signed)
Pharmacy Antibiotic Note  [name] is a 84 y.o. female admitted on 03/07/2018 with pneumonia.  Pharmacy has been consulted for Zosyn dosing.  Plan: Continue Zosyn 3.375g IV q8h (4 hour infusion).  Monitor labs, c/s, and vanco trough as indicated  Patient has listed allergy to ceftriaxone with swelling but has taken multiple times in the past.  Also, has successfully taken cefazolin.  Allergy to ceftriaxone was 01/2017 with lip swelling.  Height: 5\' 3"  (160 cm) Weight: 95 lb 0.3 oz (43.1 kg) IBW/kg (Calculated) : 52.4  Temp (24hrs), Avg:98.1 F (36.7 C), Min:97.8 F (36.6 C), Max:98.3 F (36.8 C)  Recent Labs  Lab 03/07/18 1801 03/08/18 0509 03/09/18 0604 03/10/18 0604  WBC 17.1* 12.6* 12.9* 13.6*  CREATININE 1.47* 1.18* 0.90 1.03*    Estimated Creatinine Clearance: 24.7 mL/min (A) (by C-G formula based on SCr of 1.03 mg/dL (H)).    Allergies  Allergen Reactions  . Aspirin Other (See Comments)    Cannot take uncoated due to stomach ulcers  . Megace [Megestrol]     abd pain   . Rocephin [Ceftriaxone Sodium In Dextrose] Swelling   Antimicrobials this admission: Zosyn 6/2 >>  Vanco 6/2 >> 6/2  Dose adjustments this admission: N/A  Microbiology results: 6/2 BCx: pending 6/2 UCx: EColi - Sens to Zosyn, Ancef, Rocephin, Gent, Primaxin and Nitrofurantoin   Thank you for allowing pharmacy to be a part of this patient's care.  Pricilla Young 03/10/2018 1:25 PM

## 2018-03-10 NOTE — Progress Notes (Signed)
PROGRESS NOTE    [name]  VCB:[id number] DOB: March 11, 1926 DOA: 03/07/2018 PCP: Moshe Cipro, MD    Brief Narrative:  84 year old female with multiple medical problems, admitted to the hospital with lethargy and weakness.  Found to have E. Coli urinary tract infection as well as pneumonia.  Started on intravenous antibiotics.  She was noted to be dehydrated with hyperglycemia.  She is receiving IV fluids.   Assessment & Plan:   Active Problems:   Rheumatoid arthritis ([place])   UTI (urinary tract infection)   Pressure ulcer   Dehydration   Hyperglycemia   HCAP (healthcare-associated pneumonia)   Decubitus ulcer of sacral region, stage 2   1. Pneumonia, likely aspiration pneumonia.  She is been started on antibiotics with vancomycin and Zosyn.  Since MRSA PCR was negative, discontinued further vancomycin.  Blood cultures have not showing any growth.  Seen by speech therapy with recommendations for regular diet with thin liquids except pured meats. 2. Urinary tract infection.  Recently had E. coli urinary tract infection with antibiotic resistance.  Repeat urine culture in process.  Urinalysis indicates persistent infection. Continue Zosyn (day 4) for now as patient is sensitive to this and continues to have some confusion.  3. Metabolic encephalopathy 2/2 above. Continue to monitor while on abx therapy. 4. Diabetes.  Patient was admitted with hyperglycemia.  She has been started on Lantus and sliding scale insulin.  Metformin currently on hold.  Blood sugars stable. 5. Acute kidney injury.  Related to dehydration.  Resolved with IV fluids. 6. Rheumatoid arthritis.  Chronically on steroids.  Continue on home dose of prednisone. 7. Decubitus ulcer.  Patient has unstageable decubitus ulcer on upper back that was present on admission.  Wound care has evaluated the patient. 8. History of SVT.  Continue on metoprolol and diltiazem.  Heart rate is currently stable. 9. Severe protein calorie  malnutrition.  Likely related to decreased p.o. intake.  Nutrition following   DVT prophylaxis: Lovenox Code Status: Full code Family Communication: Discussed with daughter at the bedside Disposition Plan: Discharge home once improved hopefully in 1-2 days   Consultants:   None  Procedures:   None  Antimicrobials:   Vancomycin 6/2 > 6/3  Zosyn 6/2 >   Subjective: Patient seen and evaluated this morning and she appears to be somewhat pleasantly confused.  Daughter is currently at the bedside stating the same.  No other acute events have been noted overnight.  Objective: Vitals:   03/09/18 2007 03/09/18 2026 03/10/18 0500 03/10/18 0833  BP: (!) 104/51  128/64   Pulse: 80  78   Resp: 16  18   Temp:   97.8 F (36.6 C)   TempSrc:   Oral   SpO2: 100% 98% 92% 94%  Weight:      Height:        Intake/Output Summary (Last 24 hours) at 03/10/2018 1144 Last data filed at 03/10/2018 0312 Gross per 24 hour  Intake 370 ml  Output 600 ml  Net -230 ml   Filed Weights   03/07/18 1754 03/08/18 0005 03/08/18 0500  Weight: 52.2 kg (115 lb) 43.1 kg (95 lb 0.3 oz) 43.1 kg (95 lb 0.3 oz)    Examination:  General exam: Alert, awake, no distress; minimally confused Respiratory system: Clear to auscultation. Respiratory effort normal. Cardiovascular system:RRR. No murmurs, rubs, gallops. Gastrointestinal system: Abdomen is nondistended, soft and nontender. No organomegaly or masses felt. Normal bowel sounds heard. Central nervous system: No focal neurological deficits. Extremities: contractures  in all extremities, no edema Skin: No rashes, lesions or ulcers Psychiatry: confused, pleasant     Data Reviewed: I have personally reviewed following labs and imaging studies  CBC: Recent Labs  Lab 03/07/18 1801 03/08/18 0509 03/09/18 0604 03/10/18 0604  WBC 17.1* 12.6* 12.9* 13.6*  NEUTROABS 14.6*  --   --   --   HGB 8.9* 7.7* 8.6* 9.1*  HCT 29.9* 26.0* 29.8* 30.7*  MCV 94.0  93.2 96.8 93.6  PLT 306 285 239 703   Basic Metabolic Panel: Recent Labs  Lab 03/07/18 1801 03/08/18 0509 03/09/18 0604 03/10/18 0604  NA 136 138 143 143  K 4.5 4.1 4.1 3.9  CL 99* 103 108 109  CO2 25 29 30 29   GLUCOSE 432* 153* 137* 93  BUN 37* 30* 21* 15  CREATININE 1.47* 1.18* 0.90 1.03*  CALCIUM 8.9 8.0* 8.1* 8.4*   GFR: Estimated Creatinine Clearance: 24.7 mL/min (A) (by C-G formula based on SCr of 1.03 mg/dL (H)). Liver Function Tests: Recent Labs  Lab 03/07/18 1801 03/08/18 0509  AST 22 17  ALT 12* 10*  ALKPHOS 55 47  BILITOT 0.2* 0.4  PROT 6.4* 5.4*  ALBUMIN 2.4* 2.0*   No results for input(s): LIPASE, AMYLASE in the last 168 hours. No results for input(s): AMMONIA in the last 168 hours. Coagulation Profile: No results for input(s): INR, PROTIME in the last 168 hours. Cardiac Enzymes: No results for input(s): CKTOTAL, CKMB, CKMBINDEX, TROPONINI in the last 168 hours. BNP (last 3 results) No results for input(s): PROBNP in the last 8760 hours. HbA1C: Recent Labs    03/07/18 1820  HGBA1C 7.0*   CBG: Recent Labs  Lab 03/09/18 0539 03/09/18 1201 03/09/18 1740 03/10/18 0001 03/10/18 0625  GLUCAP 149* 131* 159* 109* 91   Lipid Profile: No results for input(s): CHOL, HDL, LDLCALC, TRIG, CHOLHDL, LDLDIRECT in the last 72 hours. Thyroid Function Tests: No results for input(s): TSH, T4TOTAL, FREET4, T3FREE, THYROIDAB in the last 72 hours. Anemia Panel: No results for input(s): VITAMINB12, FOLATE, FERRITIN, TIBC, IRON, RETICCTPCT in the last 72 hours. Sepsis Labs: No results for input(s): PROCALCITON, LATICACIDVEN in the last 168 hours.  Recent Results (from the past 240 hour(s))  Culture, Urine     Status: Abnormal   Collection Time: 03/07/18  6:01 PM  Result Value Ref Range Status   Specimen Description   Final    URINE, CLEAN CATCH Performed at [facility], [address], [place], [place] [address]    Special Requests   Final     NONE Performed at [facility], [address] [place], [place] [address]    Culture >=100,000 COLONIES/mL ESCHERICHIA COLI (A)  Final   Report Status 03/10/2018 FINAL  Final   Organism ID, Bacteria ESCHERICHIA COLI (A)  Final      Susceptibility   Escherichia coli - MIC*    AMPICILLIN >=32 RESISTANT Resistant     CEFAZOLIN 8 SENSITIVE Sensitive     CEFTRIAXONE <=1 SENSITIVE Sensitive     CIPROFLOXACIN >=4 RESISTANT Resistant     GENTAMICIN <=1 SENSITIVE Sensitive     IMIPENEM 0.5 SENSITIVE Sensitive     NITROFURANTOIN <=16 SENSITIVE Sensitive     TRIMETH/SULFA >=320 RESISTANT Resistant     AMPICILLIN/SULBACTAM 16 INTERMEDIATE Intermediate     PIP/TAZO <=4 SENSITIVE Sensitive     Extended ESBL NEGATIVE Sensitive     * >=100,000 COLONIES/mL ESCHERICHIA COLI  MRSA PCR Screening     Status: None   Collection  Time: 03/07/18 11:55 PM  Result Value Ref Range Status   MRSA by PCR NEGATIVE NEGATIVE Final    Comment:        The GeneXpert MRSA Assay (FDA approved for NASAL specimens only), is one component of a comprehensive MRSA colonization surveillance program. It is not intended to diagnose MRSA infection nor to guide or monitor treatment for MRSA infections. Performed at [facility], [address], [place], [place] [address]   Culture, blood (Routine X 2) w Reflex to ID Panel     Status: None (Preliminary result)   Collection Time: 03/08/18  5:09 AM  Result Value Ref Range Status   Specimen Description BLOOD LEFT HAND  Final   Special Requests   Final    BOTTLES DRAWN AEROBIC AND ANAEROBIC Blood Culture adequate volume   Culture   Final    NO GROWTH 2 DAYS Performed at [facility], [address], [place], [place] [address]    Report Status PENDING  Incomplete  Culture, blood (Routine X 2) w Reflex to ID Panel     Status: None (Preliminary result)   Collection Time: 03/08/18  6:55 AM  Result Value Ref Range Status   Specimen Description BLOOD RIGHT ARM  Final   Special  Requests   Final    BOTTLES DRAWN AEROBIC AND ANAEROBIC Blood Culture adequate volume   Culture   Final    NO GROWTH 2 DAYS Performed at [facility], [address] [place], [place] [address]    Report Status PENDING  Incomplete         Radiology Studies: No results found.      Scheduled Meds: . aspirin EC  81 mg Oral BID  . atorvastatin  10 mg Oral QPM  . benzonatate  100 mg Oral TID  . diltiazem  120 mg Oral Daily  . enoxaparin (LOVENOX) injection  30 mg Subcutaneous Q24H  . Gerhardt's butt cream   Topical TID  . insulin aspart  0-9 Units Subcutaneous Q6H  . insulin glargine  5 Units Subcutaneous Daily  . ipratropium-albuterol  3 mL Nebulization BID  . metoprolol succinate  12.5 mg Oral Daily  . mirtazapine  7.5 mg Oral QHS  . mometasone-formoterol  2 puff Inhalation BID  . multivitamin with minerals   Oral q morning - 10a  . pantoprazole  40 mg Oral Daily  . predniSONE  5 mg Oral Q breakfast  . raloxifene  60 mg Oral Daily   Continuous Infusions: . piperacillin-tazobactam (ZOSYN)  IV 3.375 g (03/10/18 0630)     LOS: 3 days    Time spent: 31mins    Theran Vandergrift Darleen Crocker, DO [facility] Pager [phone]  If 7PM-7AM, please contact night-coverage [email or web address] Password [email or web address] 03/10/2018, 11:44 AM

## 2018-03-11 LAB — GLUCOSE, CAPILLARY
Glucose-Capillary: 92 mg/dL (ref 65–99)
Glucose-Capillary: 138 mg/dL — ABNORMAL HIGH (ref 65–99)

## 2018-03-11 LAB — BASIC METABOLIC PANEL
ANION GAP: 7 (ref 5–15)
BUN: 15 mg/dL (ref 6–20)
CALCIUM: 8.2 mg/dL — AB (ref 8.9–10.3)
CO2: 25 mmol/L (ref 22–32)
Chloride: 113 mmol/L — ABNORMAL HIGH (ref 101–111)
Creatinine, Ser: 1.03 mg/dL — ABNORMAL HIGH (ref 0.44–1.00)
GFR, EST AFRICAN AMERICAN: 54 mL/min — AB (ref >60)
GFR, EST NON AFRICAN AMERICAN: 46 mL/min — AB (ref >60)
GLUCOSE: 79 mg/dL (ref 65–99)
POTASSIUM: 3.8 mmol/L (ref 3.5–5.1)
Sodium: 145 mmol/L (ref 135–145)

## 2018-03-11 LAB — CBC
HCT: 26.9 % — ABNORMAL LOW (ref 36.0–46.0)
Hemoglobin: 8 g/dL — ABNORMAL LOW (ref 12.0–15.0)
MCH: 28.4 pg (ref 26.0–34.0)
MCHC: 29.7 g/dL — ABNORMAL LOW (ref 30.0–36.0)
MCV: 95.4 fL (ref 78.0–100.0)
PLATELETS: 236 10*3/uL (ref 150–400)
RBC: 2.82 MIL/uL — AB (ref 3.87–5.11)
RDW: 14.5 % (ref 11.5–15.5)
WBC: 10.5 10*3/uL (ref 4.0–10.5)

## 2018-03-11 MED ORDER — CEFDINIR 300 MG PO CAPS: 300.0000 mg | ORAL_CAPSULE | Freq: Two times a day (BID) | ORAL | 0 refills | Status: DC

## 2018-03-11 MED ORDER — CEFDINIR 300 MG PO CAPS: 300.0000 mg | ORAL_CAPSULE | Freq: Every day | ORAL | 0 refills | Status: AC

## 2018-03-11 MED ORDER — BENZONATATE 100 MG PO CAPS: 100.0000 mg | ORAL_CAPSULE | Freq: Three times a day (TID) | ORAL | 0 refills | Status: AC

## 2018-03-11 NOTE — Progress Notes (Signed)
Patient discharged home today per MD orders. Patient vital signs WDL. IV removed and site WDL. Discharge Instructions including follow up appointments, medications, and education reviewed with patient. Patient verbalizes understanding. Patient is transported out via wheelchair.  

## 2018-03-11 NOTE — Care Management Note (Signed)
Case Management Note  Patient Details  Name: Tequia Wolman MRN: [id number] Date of Birth: June 26, 1926  Subjective/Objective:     Admitted with  HCAP. Pt from home with family. She has 24/7 care. She goes to the [facility] and has [facility] RN come to the home twice a week for wound care. Per daughter they would like [facility] services to continue.               Action/Plan: DC home today with 24/7 care and resumption of [facility] services. Daughter aware HH has 48 hrs to make resumption visit. CM has made contact with Leafy Ro ([facility] rep). Pt will need new face to face. MD aware. CM will fax orders and DC summary once available.   Expected Discharge Date:  03/11/18               Expected Discharge Plan:  [place]  In-House Referral:  NA  Discharge planning Services  CM Consult  Post Acute Care Choice:  Home Health, Resumption of Svcs/PTA Provider Choice offered to:  Adult Children  HH Arranged:  [facility] [place] Agency:  Other - See comment  Status of Service:  Completed, signed off  Sherald Barge, RN 03/11/2018, 11:26 AM

## 2018-03-11 NOTE — Discharge Summary (Signed)
Physician Discharge Summary  [name] NWG:[id number] DOB: [birth year] DOA: 03/07/2018  PCP: Moshe Cipro, MD  Admit date: 03/07/2018  Discharge date: 03/11/2018  Admitted From:Home  Disposition:  Home  Recommendations for Outpatient Follow-up:  1. Follow up with PCP in 1-2 weeks  Home Health:N/A  Equipment/Devices:N/A  Discharge Condition:Stable  CODE STATUS: Full  Diet recommendation: Dysphagia 1 with thin liquid diet  Brief/Interim Summary:  This is an 84 year old female with multiple medical problems who was admitted with lethargy and weakness and was found to have E. coli UTI as well as pneumonia.  She was thought to have some aspiration pneumonia and had speech evaluation and has been recommended to remain on dysphagia 1 diet with thin liquids.  She had remained on IV Zosyn during her stay to cover both aspiration pneumonia as well as her UTI and she has received a 4-day course of this thus far.  She has responded quite well and is much less lethargic.  Per her daughters at the bedside, she is more alert and awake and less confused.  She has been recommended to remain on Omnicef for the course of 3 more days to finish the course of treatment.  Discharge Diagnoses:  Active Problems:   Rheumatoid arthritis ([place])   UTI (urinary tract infection)   Pressure ulcer   Dehydration   Hyperglycemia   HCAP (healthcare-associated pneumonia)   Decubitus ulcer of sacral region, stage 2  1. Pneumonia, likely aspiration pneumonia.  She is been started on antibiotics with vancomycin and Zosyn.  Since MRSA PCR was negative, discontinued further vancomycin.  Blood cultures have not showing any growth.  Seen by speech therapy with recommendations for regular diet with thin liquids except pured meats. Continue Omnicef for 3 more days. 2. Urinary tract infection.  Recently had E. coli urinary tract infection with antibiotic resistance.  Repeat urine culture with EColi sensitive to  cephalosporins and Zosyn that she was on while here. Will receive 3 more days of Omnicef. 3. Diabetes.  Patient was admitted with hyperglycemia.  She has been started on Lantus and sliding scale insulin.  Metformin to be restarted at home. 4. Acute kidney injury.  Related to dehydration.  Resolved with IV fluids. 5. Rheumatoid arthritis.  Chronically on steroids.  Continue on home dose of prednisone. 6. Decubitus ulcer.  Patient has unstageable decubitus ulcer on upper back that was present on admission.  Wound care has evaluated the patient. 7. History of SVT.  Continue on metoprolol and diltiazem.  Heart rate is currently stable. 8. Severe protein calorie malnutrition.  Likely related to decreased p.o. intake.  Nutrition following   Discharge Instructions  Discharge Instructions    Diet - low sodium heart healthy   Complete by:  As directed    Increase activity slowly   Complete by:  As directed      Allergies as of 03/11/2018      Reactions   Aspirin Other (See Comments)   Cannot take uncoated due to stomach ulcers   Megace [megestrol]    abd pain   Rocephin [ceftriaxone Sodium In Dextrose] Swelling      Medication List    STOP taking these medications   ciprofloxacin 500 MG tablet Commonly known as:  CIPRO   nitrofurantoin 50 MG capsule Commonly known as:  MACRODANTIN     TAKE these medications   aspirin EC 81 MG tablet Take 81 mg by mouth 2 (two) times daily.   atorvastatin 10 MG tablet Commonly known as:  LIPITOR Take 10 mg by mouth every evening.   B-D UF III MINI PEN NEEDLES 31G X 5 MM Misc Generic drug:  Insulin Pen Needle 4 (four) times daily. use as directed   benzonatate 100 MG capsule Commonly known as:  TESSALON Take 1 capsule (100 mg total) by mouth 3 (three) times daily.   budesonide-formoterol 160-4.5 MCG/ACT inhaler Commonly known as:  SYMBICORT Inhale 2 puffs into the lungs 2 (two) times daily.   cefdinir 300 MG capsule Commonly known as:   OMNICEF Take 1 capsule (300 mg total) by mouth daily for 3 days.   CENTRUM SILVER ADULT 50+ PO Take 1 tablet by mouth every morning.   ciclopirox 8 % solution Commonly known as:  PENLAC APPLY TO FUNGAL TOENAILS EVERY DAY   CLEAR EYES ALL SEASONS 5-6 MG/ML Soln Generic drug:  Polyvinyl Alcohol-Povidone Apply 1 drop to eye every morning.   D-MANNOSE PO Take 500 mg by mouth daily.   diltiazem 120 MG tablet Commonly known as:  CARDIZEM Take 120 mg by mouth daily.   guaiFENesin 600 MG 12 hr tablet Commonly known as:  MUCINEX Take 1 tablet (600 mg total) by mouth 2 (two) times daily. What changed:    when to take this  reasons to take this   insulin aspart 100 UNIT/ML FlexPen Commonly known as:  NOVOLOG Inject 4 Units into the skin 3 (three) times daily with meals. What changed:    how much to take  additional instructions   Insulin Glargine 100 UNIT/ML Solostar Pen Commonly known as:  LANTUS Inject 5 Units into the skin daily. What changed:    how much to take  when to take this   ipratropium-albuterol 0.5-2.5 (3) MG/3ML Soln Commonly known as:  DUONEB Take 3 mLs by nebulization 2 (two) times daily.   lactobacillus acidophilus & bulgar chewable tablet CHEW 1 TABLET BY MOUTH EVERY DAY   magnesium oxide 400 (241.3 Mg) MG tablet Commonly known as:  MAG-OX Take 0.5 tablets (200 mg total) by mouth daily. What changed:    how much to take  additional instructions   metFORMIN 500 MG tablet Commonly known as:  GLUCOPHAGE Take 1,000 mg by mouth 2 (two) times daily with a meal.   metoprolol succinate 25 MG 24 hr tablet Commonly known as:  TOPROL-XL Take 0.5 tablets (12.5 mg total) by mouth daily.   mirtazapine 15 MG tablet Commonly known as:  REMERON Take 0.5 tablets (7.5 mg total) by mouth at bedtime.   pantoprazole 40 MG tablet Commonly known as:  PROTONIX Take 40 mg by mouth daily.   POLY-IRON 150 FORTE 150-0.025-1 MG Caps Generic drug:  Iron  Polysacch Cmplx-B12-FA TAKE ONE CAPSULE BY MOUTH DAILY   predniSONE 5 MG tablet Commonly known as:  DELTASONE Take 1 tablet (5 mg total) by mouth daily with breakfast.   raloxifene 60 MG tablet Commonly known as:  EVISTA Take 60 mg by mouth daily.   Vitamin D3 2000 units capsule Take 2,000 Units by mouth daily.      Follow-up Information    Moshe Cipro, MD Follow up in 2 week(s).   Specialty:  Internal Medicine Contact information: [address] [place] [address] [phone]          Allergies  Allergen Reactions  . Aspirin Other (See Comments)    Cannot take uncoated due to stomach ulcers  . Megace [Megestrol]     abd pain   . Rocephin [Ceftriaxone Sodium In Dextrose]  Swelling    Consultations:  None   Procedures/Studies: Dg Chest Port 1 View  Result Date: 03/07/2018 CLINICAL DATA:  Productive cough. EXAM: PORTABLE CHEST 1 VIEW COMPARISON:  Radiograph 02/11/2018, additional priors FINDINGS: Exaggerated kyphosis with patient's chin obscuring the left lung apex, similar to prior exams, positioning limits assessment. Elevated left hemidiaphragm with opacity at the left lung base, new. Heart size and mediastinal contours are unchanged allowing for differences in positioning. Mild vascular congestion without pulmonary edema. No pneumothorax. Limited assessment for pleural effusion. IMPRESSION: Left basilar opacity, in the setting of volume loss in the left hemithorax may be aspiration or atelectasis/collapse. Pneumonia is also considered. Recommend radiographic follow-up to resolution. Electronically Signed   By: Jeb Levering M.D.   On: 03/07/2018 19:36   Dg Chest Portable 1 View  Result Date: 02/11/2018 CLINICAL DATA:  Jugular catheter placement EXAM: PORTABLE CHEST 1 VIEW COMPARISON:  1400 hours FINDINGS: Right jugular central venous catheter has been placed. The tip projects over the cavoatrial junction. Thorax is rotated to the left. No obvious  pneumothorax. Left basilar atelectasis versus airspace disease is stable. IMPRESSION: Right jugular venous catheter with its tip projecting over the cavoatrial junction. No pneumothorax. Electronically Signed   By: Marybelle Killings M.D.   On: 02/11/2018 17:13   Dg Abdomen Acute W/chest  Result Date: 02/11/2018 CLINICAL DATA:  Cough and decreased p.o. intake EXAM: DG ABDOMEN ACUTE W/ 1V CHEST COMPARISON:  Chest radiograph 01/05/2018 FINDINGS: Examination is degraded by difficulties with patient positioning. There is elevation of the left hemidiaphragm. The right lung is clear. The midportion of the left lung is clear, but the visualization of the rest of the left lung is very limited. There is no free intraperitoneal air. There is a large amount of stool throughout the colon, predominantly on the left side. Within the rectum is a large stool ball with surrounding gas. IMPRESSION: 1. Limited assessment of the chest due to difficulties with patient positioning but no evidence of acute cardiopulmonary disease. 2. Large amount of stool within the distal colon, including large rectal stool ball suggesting fecal impaction. Electronically Signed   By: Ulyses Jarred M.D.   On: 02/11/2018 15:23    Discharge Exam: Vitals:   03/11/18 0630 03/11/18 0759  BP:    Pulse:    Resp: 20   Temp:    SpO2:  100%   Vitals:   03/10/18 2202 03/11/18 0629 03/11/18 0630 03/11/18 0759  BP: (!) 115/48 134/78    Pulse: 96 81    Resp: (!) 22 (!) 30 20   Temp:  97.7 F (36.5 C)    TempSrc:  Oral    SpO2: 100% 96%  100%  Weight:      Height:        General: Pt is alert, awake, not in acute distress Cardiovascular: RRR, S1/S2 +, no rubs, no gallops Respiratory: CTA bilaterally, no wheezing, no rhonchi; on [place] and wears at home Abdominal: Soft, NT, ND, bowel sounds + Extremities: no edema, no cyanosis    The results of significant diagnostics from this hospitalization (including imaging, microbiology, ancillary and  laboratory) are listed below for reference.     Microbiology: Recent Results (from the past 240 hour(s))  Culture, Urine     Status: Abnormal   Collection Time: 03/07/18  6:01 PM  Result Value Ref Range Status   Specimen Description   Final    URINE, CLEAN CATCH Performed at [facility], [address] [place], [place]  [address]    Special Requests   Final    NONE Performed at [facility], [address] [place], [place] [address]    Culture >=100,000 COLONIES/mL ESCHERICHIA COLI (A)  Final   Report Status 03/10/2018 FINAL  Final   Organism ID, Bacteria ESCHERICHIA COLI (A)  Final      Susceptibility   Escherichia coli - MIC*    AMPICILLIN >=32 RESISTANT Resistant     CEFAZOLIN 8 SENSITIVE Sensitive     CEFTRIAXONE <=1 SENSITIVE Sensitive     CIPROFLOXACIN >=4 RESISTANT Resistant     GENTAMICIN <=1 SENSITIVE Sensitive     IMIPENEM 0.5 SENSITIVE Sensitive     NITROFURANTOIN <=16 SENSITIVE Sensitive     TRIMETH/SULFA >=320 RESISTANT Resistant     AMPICILLIN/SULBACTAM 16 INTERMEDIATE Intermediate     PIP/TAZO <=4 SENSITIVE Sensitive     Extended ESBL NEGATIVE Sensitive     * >=100,000 COLONIES/mL ESCHERICHIA COLI  MRSA PCR Screening     Status: None   Collection Time: 03/07/18 11:55 PM  Result Value Ref Range Status   MRSA by PCR NEGATIVE NEGATIVE Final    Comment:        The GeneXpert MRSA Assay (FDA approved for NASAL specimens only), is one component of a comprehensive MRSA colonization surveillance program. It is not intended to diagnose MRSA infection nor to guide or monitor treatment for MRSA infections. Performed at [facility], [address], [place], [place] [address]   Culture, blood (Routine X 2) w Reflex to ID Panel     Status: None (Preliminary result)   Collection Time: 03/08/18  5:09 AM  Result Value Ref Range Status   Specimen Description BLOOD LEFT HAND  Final   Special Requests   Final    BOTTLES DRAWN AEROBIC AND ANAEROBIC Blood Culture  adequate volume   Culture   Final    NO GROWTH 2 DAYS Performed at [facility], [address], [place], [place] [address]    Report Status PENDING  Incomplete  Culture, blood (Routine X 2) w Reflex to ID Panel     Status: None (Preliminary result)   Collection Time: 03/08/18  6:55 AM  Result Value Ref Range Status   Specimen Description BLOOD RIGHT ARM  Final   Special Requests   Final    BOTTLES DRAWN AEROBIC AND ANAEROBIC Blood Culture adequate volume   Culture   Final    NO GROWTH 2 DAYS Performed at [facility], [address], [place],  [address]    Report Status PENDING  Incomplete     Labs: BNP (last 3 results) No results for input(s): BNP in the last 8760 hours. Basic Metabolic Panel: Recent Labs  Lab 03/07/18 1801 03/08/18 0509 03/09/18 0604 03/10/18 0604 03/11/18 0628  NA 136 138 143 143 145  K 4.5 4.1 4.1 3.9 3.8  CL 99* 103 108 109 113*  CO2 25 29 30 29 25   GLUCOSE 432* 153* 137* 93 79  BUN 37* 30* 21* 15 15  CREATININE 1.47* 1.18* 0.90 1.03* 1.03*  CALCIUM 8.9 8.0* 8.1* 8.4* 8.2*   Liver Function Tests: Recent Labs  Lab 03/07/18 1801 03/08/18 0509  AST 22 17  ALT 12* 10*  ALKPHOS 55 47  BILITOT 0.2* 0.4  PROT 6.4* 5.4*  ALBUMIN 2.4* 2.0*   No results for input(s): LIPASE, AMYLASE in the last 168 hours. No results for input(s): AMMONIA in the last 168 hours. CBC: Recent Labs  Lab 03/07/18 1801 03/08/18 0509  03/09/18 0604 03/10/18 0604 03/11/18 0628  WBC 17.1* 12.6* 12.9* 13.6* 10.5  NEUTROABS 14.6*  --   --   --   --   HGB 8.9* 7.7* 8.6* 9.1* 8.0*  HCT 29.9* 26.0* 29.8* 30.7* 26.9*  MCV 94.0 93.2 96.8 93.6 95.4  PLT 306 285 239 278 236   Cardiac Enzymes: No results for input(s): CKTOTAL, CKMB, CKMBINDEX, TROPONINI in the last 168 hours. BNP: Invalid input(s): POCBNP CBG: Recent Labs  Lab 03/10/18 0001 03/10/18 0625 03/10/18 1217 03/10/18 2354 03/11/18 0553  GLUCAP 109* 91 153* 259* 92   D-Dimer No results for  input(s): DDIMER in the last 72 hours. Hgb A1c No results for input(s): HGBA1C in the last 72 hours. Lipid Profile No results for input(s): CHOL, HDL, LDLCALC, TRIG, CHOLHDL, LDLDIRECT in the last 72 hours. Thyroid function studies No results for input(s): TSH, T4TOTAL, T3FREE, THYROIDAB in the last 72 hours.  Invalid input(s): FREET3 Anemia work up No results for input(s): VITAMINB12, FOLATE, FERRITIN, TIBC, IRON, RETICCTPCT in the last 72 hours. Urinalysis    Component Value Date/Time   COLORURINE YELLOW 03/07/2018 1801   APPEARANCEUR CLOUDY (A) 03/07/2018 1801   LABSPEC 1.017 03/07/2018 1801   PHURINE 5.0 03/07/2018 1801   GLUCOSEU >=500 (A) 03/07/2018 1801   HGBUR NEGATIVE 03/07/2018 1801   BILIRUBINUR NEGATIVE 03/07/2018 1801   KETONESUR NEGATIVE 03/07/2018 1801   PROTEINUR 30 (A) 03/07/2018 1801   UROBILINOGEN 0.2 06/30/2015 1317   NITRITE NEGATIVE 03/07/2018 1801   LEUKOCYTESUR MODERATE (A) 03/07/2018 1801   Sepsis Labs Invalid input(s): PROCALCITONIN,  WBC,  LACTICIDVEN Microbiology Recent Results (from the past 240 hour(s))  Culture, Urine     Status: Abnormal   Collection Time: 03/07/18  6:01 PM  Result Value Ref Range Status   Specimen Description   Final    URINE, CLEAN CATCH Performed at [facility], [address] [place], [place] [address]    Special Requests   Final    NONE Performed at [facility], [address], [place], [place] [address]    Culture >=100,000 COLONIES/mL ESCHERICHIA COLI (A)  Final   Report Status 03/10/2018 FINAL  Final   Organism ID, Bacteria ESCHERICHIA COLI (A)  Final      Susceptibility   Escherichia coli - MIC*    AMPICILLIN >=32 RESISTANT Resistant     CEFAZOLIN 8 SENSITIVE Sensitive     CEFTRIAXONE <=1 SENSITIVE Sensitive     CIPROFLOXACIN >=4 RESISTANT Resistant     GENTAMICIN <=1 SENSITIVE Sensitive     IMIPENEM 0.5 SENSITIVE Sensitive     NITROFURANTOIN <=16 SENSITIVE Sensitive     TRIMETH/SULFA >=320 RESISTANT  Resistant     AMPICILLIN/SULBACTAM 16 INTERMEDIATE Intermediate     PIP/TAZO <=4 SENSITIVE Sensitive     Extended ESBL NEGATIVE Sensitive     * >=100,000 COLONIES/mL ESCHERICHIA COLI  MRSA PCR Screening     Status: None   Collection Time: 03/07/18 11:55 PM  Result Value Ref Range Status   MRSA by PCR NEGATIVE NEGATIVE Final    Comment:        The GeneXpert MRSA Assay (FDA approved for NASAL specimens only), is one component of a comprehensive MRSA colonization surveillance program. It is not intended to diagnose MRSA infection nor to guide or monitor treatment for MRSA infections. Performed at [facility], [address] [place], [place] [address]   Culture, blood (Routine X 2) w Reflex to ID Panel     Status: None (Preliminary result)  Collection Time: 03/08/18  5:09 AM  Result Value Ref Range Status   Specimen Description BLOOD LEFT HAND  Final   Special Requests   Final    BOTTLES DRAWN AEROBIC AND ANAEROBIC Blood Culture adequate volume   Culture   Final    NO GROWTH 2 DAYS Performed at [facility], [address] [place], [place] [address]    Report Status PENDING  Incomplete  Culture, blood (Routine X 2) w Reflex to ID Panel     Status: None (Preliminary result)   Collection Time: 03/08/18  6:55 AM  Result Value Ref Range Status   Specimen Description BLOOD RIGHT ARM  Final   Special Requests   Final    BOTTLES DRAWN AEROBIC AND ANAEROBIC Blood Culture adequate volume   Culture   Final    NO GROWTH 2 DAYS Performed at [facility], [address] [place], [place] [address]    Report Status PENDING  Incomplete     Time coordinating discharge: 35 minutes  SIGNED:   Rodena Goldmann, DO [facility] 03/11/2018, 11:00 AM Pager [phone]  If 7PM-7AM, please contact night-coverage [email or web address] Password [email or web address]

## 2018-03-11 NOTE — Care Management Important Message (Signed)
Important Message  Patient Details  Name: [name] MRN: [id number] Date of Birth: Aug 30, 1926   Medicare Important Message Given:  Yes    Sherald Barge, RN 03/11/2018, 11:39 AM

## 2018-03-13 LAB — CULTURE, BLOOD (ROUTINE X 2)
Culture: NO GROWTH
Culture: NO GROWTH
Special Requests: ADEQUATE
Special Requests: ADEQUATE

## 2018-03-25 ENCOUNTER — Other Ambulatory Visit: Payer: Self-pay

## 2018-03-25 ENCOUNTER — Emergency Department (HOSPITAL_COMMUNITY)
Admission: EM | Admit: 2018-03-25 | Discharge: 2018-03-25 | Disposition: A | Discharge status: Home / Self Care | Payer: Medicare Other | Attending: Emergency Medicine | Admitting: Emergency Medicine

## 2018-03-25 ENCOUNTER — Emergency Department (HOSPITAL_COMMUNITY): Payer: Medicare Other

## 2018-03-25 ENCOUNTER — Encounter (HOSPITAL_COMMUNITY): Payer: Self-pay | Admitting: Emergency Medicine

## 2018-03-25 DIAGNOSIS — Z7982 Long term (current) use of aspirin: Secondary | ICD-10-CM | POA: Insufficient documentation

## 2018-03-25 DIAGNOSIS — J45909 Unspecified asthma, uncomplicated: Secondary | ICD-10-CM | POA: Insufficient documentation

## 2018-03-25 DIAGNOSIS — Z794 Long term (current) use of insulin: Secondary | ICD-10-CM | POA: Diagnosis not present

## 2018-03-25 DIAGNOSIS — N390 Urinary tract infection, site not specified: Secondary | ICD-10-CM | POA: Insufficient documentation

## 2018-03-25 DIAGNOSIS — I2510 Atherosclerotic heart disease of native coronary artery without angina pectoris: Secondary | ICD-10-CM | POA: Diagnosis not present

## 2018-03-25 DIAGNOSIS — Z79899 Other long term (current) drug therapy: Secondary | ICD-10-CM | POA: Insufficient documentation

## 2018-03-25 DIAGNOSIS — R799 Abnormal finding of blood chemistry, unspecified: Secondary | ICD-10-CM | POA: Diagnosis present

## 2018-03-25 DIAGNOSIS — I10 Essential (primary) hypertension: Secondary | ICD-10-CM | POA: Insufficient documentation

## 2018-03-25 DIAGNOSIS — Z853 Personal history of malignant neoplasm of breast: Secondary | ICD-10-CM | POA: Insufficient documentation

## 2018-03-25 LAB — URINALYSIS, ROUTINE W REFLEX MICROSCOPIC
BILIRUBIN URINE: NEGATIVE
GLUCOSE, UA: 50 mg/dL — AB
Ketones, ur: NEGATIVE mg/dL
NITRITE: NEGATIVE
PROTEIN: 30 mg/dL — AB
Specific Gravity, Urine: 1.015 (ref 1.005–1.030)
pH: 7 (ref 5.0–8.0)

## 2018-03-25 LAB — CBC WITH DIFFERENTIAL/PLATELET
BASOS ABS: 0 10*3/uL (ref 0.0–0.1)
BASOS PCT: 0 %
Eosinophils Absolute: 0 10*3/uL (ref 0.0–0.7)
Eosinophils Relative: 0 %
HEMATOCRIT: 31.1 % — AB (ref 36.0–46.0)
HEMOGLOBIN: 9.2 g/dL — AB (ref 12.0–15.0)
Lymphocytes Relative: 10 %
Lymphs Abs: 2.2 10*3/uL (ref 0.7–4.0)
MCH: 27.8 pg (ref 26.0–34.0)
MCHC: 29.6 g/dL — ABNORMAL LOW (ref 30.0–36.0)
MCV: 94 fL (ref 78.0–100.0)
Monocytes Absolute: 0.5 10*3/uL (ref 0.1–1.0)
Monocytes Relative: 2 %
NEUTROS ABS: 18.6 10*3/uL — AB (ref 1.7–7.7)
NEUTROS PCT: 88 %
Platelets: 449 10*3/uL — ABNORMAL HIGH (ref 150–400)
RBC: 3.31 MIL/uL — AB (ref 3.87–5.11)
RDW: 14.7 % (ref 11.5–15.5)
WBC: 21.4 10*3/uL — AB (ref 4.0–10.5)

## 2018-03-25 LAB — COMPREHENSIVE METABOLIC PANEL
ALK PHOS: 68 U/L (ref 38–126)
ALT: 19 U/L (ref 14–54)
ANION GAP: 8 (ref 5–15)
AST: 21 U/L (ref 15–41)
Albumin: 2.5 g/dL — ABNORMAL LOW (ref 3.5–5.0)
BUN: 20 mg/dL (ref 6–20)
CO2: 30 mmol/L (ref 22–32)
Calcium: 8.9 mg/dL (ref 8.9–10.3)
Chloride: 98 mmol/L — ABNORMAL LOW (ref 101–111)
Creatinine, Ser: 0.98 mg/dL (ref 0.44–1.00)
GFR calc non Af Amer: 49 mL/min — ABNORMAL LOW (ref >60)
GFR, EST AFRICAN AMERICAN: 57 mL/min — AB (ref >60)
Glucose, Bld: 237 mg/dL — ABNORMAL HIGH (ref 65–99)
POTASSIUM: 5.2 mmol/L — AB (ref 3.5–5.1)
Sodium: 136 mmol/L (ref 135–145)
TOTAL PROTEIN: 6.8 g/dL (ref 6.5–8.1)
Total Bilirubin: 0.3 mg/dL (ref 0.3–1.2)

## 2018-03-25 MED ORDER — SODIUM CHLORIDE 0.9 % IV BOLUS
500.0000 mL | Freq: Once | INTRAVENOUS | Status: AC
  Administered 2018-03-25: 500 mL via INTRAVENOUS

## 2018-03-25 MED ORDER — FOSFOMYCIN TROMETHAMINE 3 G PO PACK
3.0000 g | PACK | Freq: Once | ORAL | Status: AC
  Administered 2018-03-25: 3 g via ORAL
  Filled 2018-03-25 (×2): qty 3

## 2018-03-25 NOTE — Discharge Instructions (Addendum)
As discussed, your evaluation today has been largely reassuring.  But, it is important that you monitor your condition carefully, and do not hesitate to return to the ED if you develop new, or concerning changes in your condition.  You have been provided the sole antibiotic necessary for tonight infection.  Otherwise, please follow-up with your physician for appropriate ongoing care.

## 2018-03-25 NOTE — ED Notes (Signed)
Foley catheter attemped x3 and unsuccessful. [place] applied.

## 2018-03-25 NOTE — ED Provider Notes (Signed)
[facility] EMERGENCY DEPARTMENT Provider Note   CSN: [id number] Arrival date & time: 03/25/18  1553     History   Chief Complaint Chief Complaint  Patient presents with  . Abnormal Lab    HPI [name] is a 84 y.o. female.  HPI Patient presents with her daughters who assist with the HPI. The patient herself states that she feels fine.  When she denies pain, lightheadedness, nausea, vomiting She did have recent hospitalization for pneumonia, but has completed her antibiotics. Today the patient went to her physician for a follow-up, and was found to have leukocytosis, and temperature of 99.9. She was sent here for evaluation. Family members deny recent other changes from baseline, state that the patient is generally been improving since her hospitalization. Past Medical History:  Diagnosis Date  . Acid reflux   . Anemia   . Arthritis    ra  . Asthma   . Cancer [facility])    breast cancer  . Coronary artery disease   . Diabetes mellitus without complication ([place])   . Diverticulitis   . Gastric ulcer   . Hyperlipidemia   . Hypertension   . Hypoxemia   . Kyphosis   . Kyphosis   . Mitral valve disorder   . Osteoporosis   . Oxygen dependent    2 liters at night  . Raynaud disease   . Thrombocytopenia [facility])     Patient Active Problem List   Diagnosis Date Noted  . SVT (supraventricular tachycardia) ([place]) 01/06/2018  . Sepsis secondary to UTI ([place]) 01/05/2018  . Hypernatremia 01/05/2018  . Decubitus ulcer of thoracic spine area, stage III ([place]) 01/05/2018  . Decubitus ulcer of sacral region, stage 2 01/05/2018  . Anemia 01/05/2018  . HCAP (healthcare-associated pneumonia) 03/06/2017  . Hyperglycemia 03/03/2017  . Electrolyte abnormality 03/03/2017  . Pressure injury of skin 01/16/2017  . Dehydration   . Lactic acidosis 07/19/2016  . Asthma, chronic 04/12/2016  . Sepsis ([place]) 03/08/2016  . CAP (community acquired pneumonia) 10/09/2015  . Pressure ulcer  10/09/2015  . Kyphosis   . UTI (urinary tract infection) 06/30/2015  . Breast cancer of upper-outer quadrant of left female breast ([place]) 05/09/2015  . Rheumatoid arthritis ([place]) 10/29/2012  . HTN (hypertension) 10/29/2012  . Diabetes type 2, controlled ([place]) 10/29/2012  . GERD (gastroesophageal reflux disease) 10/29/2012    Past Surgical History:  Procedure Laterality Date  . BREAST SURGERY    . HERNIA REPAIR    . MASTECTOMY Right 1978  . SIMPLE MASTECTOMY WITH AXILLARY SENTINEL NODE BIOPSY Left 05/09/2015   Procedure: LEFT SIMPLE MASTECTOMY;  Surgeon: Erroll Luna, MD;  Location: [place];  Service: General;  Laterality: Left;     OB History    Gravida  4   Para  4   Term  4   Preterm      AB      Living  4     SAB      TAB      Ectopic      Multiple      Live Births               Home Medications    Prior to Admission medications   Medication Sig Start Date End Date Taking? Authorizing Provider  aspirin EC 81 MG tablet Take 81 mg by mouth 2 (two) times daily.    Yes [provider]  atorvastatin (LIPITOR) 10 MG tablet Take 10 mg by mouth every evening.  Yes [provider]  benzonatate (TESSALON) 100 MG capsule Take 1 capsule (100 mg total) by mouth 3 (three) times daily. 03/11/18  Yes Manuella Ghazi, Pratik D, DO  budesonide-formoterol (SYMBICORT) 160-4.5 MCG/ACT inhaler Inhale 2 puffs into the lungs 2 (two) times daily.   Yes [provider]  Cholecalciferol (VITAMIN D3) 2000 units capsule Take 2,000 Units by mouth daily.    Yes [provider]  ciclopirox (PENLAC) 8 % solution APPLY TO FUNGAL TOENAILS EVERY DAY 02/11/16  Yes [provider]  D-MANNOSE PO Take 500 mg by mouth daily.    Yes [provider]  diltiazem (CARDIZEM) 120 MG tablet Take 120 mg by mouth daily.   Yes [provider]  guaiFENesin (MUCINEX) 600 MG 12 hr tablet Take 1 tablet (600 mg total) by mouth 2 (two) times daily. Patient taking  differently: Take 600 mg by mouth 2 (two) times daily as needed for cough or to loosen phlegm.  03/07/17  Yes Kathie Dike, MD  insulin aspart (NOVOLOG) 100 UNIT/ML FlexPen Inject 4 Units into the skin 3 (three) times daily with meals. Patient taking differently: Inject 1-10 Units into the skin 3 (three) times daily with meals. Per sliding scale. For levels over 350- 10 units is administered 03/07/17  Yes Memon, Jolaine Artist, MD  Insulin Glargine (LANTUS) 100 UNIT/ML Solostar Pen Inject 5 Units into the skin daily. Patient taking differently: Inject 3 Units into the skin at bedtime.  03/07/17  Yes Kathie Dike, MD  ipratropium-albuterol (DUONEB) 0.5-2.5 (3) MG/3ML SOLN Take 3 mLs by nebulization 2 (two) times daily. 03/07/17  Yes Kathie Dike, MD  lactobacillus acidophilus & bulgar (LACTINEX) chewable tablet CHEW 1 TABLET BY MOUTH EVERY DAY 09/17/15  Yes [provider]  magnesium oxide (MAG-OX) 400 (241.3 Mg) MG tablet Take 0.5 tablets (200 mg total) by mouth daily. Patient taking differently: Take 200-400 mg by mouth daily. Alternating 200 mg one day then 400 mg the next. 04/15/16  Yes Reyne Dumas, MD  metFORMIN (GLUCOPHAGE) 500 MG tablet Take 1,000 mg by mouth 2 (two) times daily with a meal.   Yes [provider]  metoprolol succinate (TOPROL-XL) 25 MG 24 hr tablet Take 0.5 tablets (12.5 mg total) by mouth daily. Patient taking differently: Take 25 mg by mouth every morning.  01/11/18  Yes Johnson, Clanford L, MD  Multiple Vitamins-Minerals (CENTRUM SILVER ADULT 50+ PO) Take 1 tablet by mouth every morning.    Yes [provider]  nitrofurantoin (MACRODANTIN) 50 MG capsule Take 50 mg by mouth daily.   Yes [provider]  pantoprazole (PROTONIX) 40 MG tablet Take 40 mg by mouth daily.   Yes [provider]  POLY-IRON 150 FORTE 150-25-1 MG-MCG-MG CAPS TAKE ONE CAPSULE BY MOUTH DAILY 04/07/17  Yes Kefalas, Manon Hilding, PA-C  Polyvinyl Alcohol-Povidone (CLEAR EYES  ALL SEASONS) 5-6 MG/ML SOLN Apply 1 drop to eye every morning.    Yes [provider]  predniSONE (DELTASONE) 5 MG tablet Take 1 tablet (5 mg total) by mouth daily with breakfast. 02/12/18  Yes Johnson, Clanford L, MD  raloxifene (EVISTA) 60 MG tablet Take 60 mg by mouth daily.   Yes [provider]  mirtazapine (REMERON) 15 MG tablet Take 0.5 tablets (7.5 mg total) by mouth at bedtime. Patient not taking: Reported on 03/25/2018 02/12/18   Murlean Iba, MD    Family History Family History  Problem Relation Age of Onset  . Asthma Sister   . Rheum arthritis Daughter  Social History Social History   Tobacco Use  . Smoking status: Never Smoker  . Smokeless tobacco: Never Used  Substance Use Topics  . Alcohol use: No  . Drug use: No     Allergies   Aspirin; Megace [megestrol]; and Rocephin [ceftriaxone sodium in dextrose]   Review of Systems Review of Systems  Constitutional:       Per HPI, otherwise negative  HENT:       Per HPI, otherwise negative  Respiratory:       Per HPI, otherwise negative  Cardiovascular:       Per HPI, otherwise negative  Gastrointestinal: Negative for diarrhea, nausea and vomiting.  Endocrine:       Negative aside from HPI  Genitourinary:       Neg aside from HPI   Musculoskeletal:       Per HPI, otherwise negative  Skin: Negative.   Neurological: Positive for weakness. Negative for syncope.     Physical Exam Updated Vital Signs BP 126/68   Pulse 72   Temp 97.6 F (36.4 C) (Oral)   Resp 20   Ht 5\' 3"  (1.6 m)   Wt 43.1 kg (95 lb)   SpO2 96%   BMI 16.83 kg/m   Physical Exam  Constitutional: She is oriented to person, place, and time. She has a sickly appearance. No distress.  HENT:  Head: Normocephalic and atraumatic.  Eyes: Conjunctivae and EOM are normal.  Neck:    Cardiovascular: Normal rate and regular rhythm.  Pulmonary/Chest: Effort normal and breath sounds normal. No stridor. No respiratory  distress.  Abdominal: She exhibits no distension.  Musculoskeletal: She exhibits no edema.  Neurological: She is alert and oriented to person, place, and time. No cranial nerve deficit.  Skin: Skin is warm and dry.  Psychiatric: She has a normal mood and affect.  Nursing note and vitals reviewed.    ED Treatments / Results  Labs (all labs ordered are listed, but only abnormal results are displayed) Labs Reviewed  COMPREHENSIVE METABOLIC PANEL - Abnormal; Notable for the following components:      Result Value   Potassium 5.2 (*)    Chloride 98 (*)    Glucose, Bld 237 (*)    Albumin 2.5 (*)    GFR calc non Af Amer 49 (*)    GFR calc Af Amer 57 (*)    All other components within normal limits  CBC WITH DIFFERENTIAL/PLATELET - Abnormal; Notable for the following components:   WBC 21.4 (*)    RBC 3.31 (*)    Hemoglobin 9.2 (*)    HCT 31.1 (*)    MCHC 29.6 (*)    Platelets 449 (*)    Neutro Abs 18.6 (*)    All other components within normal limits  URINALYSIS, ROUTINE W REFLEX MICROSCOPIC - Abnormal; Notable for the following components:   APPearance CLOUDY (*)    Glucose, UA 50 (*)    Hgb urine dipstick SMALL (*)    Protein, ur 30 (*)    Leukocytes, UA LARGE (*)    WBC, UA >50 (*)    Bacteria, UA RARE (*)    All other components within normal limits    EKG None  Radiology Dg Chest 2 View  Result Date: 03/25/2018 CLINICAL DATA:  Lethargy for 2 days. Low-grade fever and leukocytosis. History of breast cancer. EXAM: CHEST - 2 VIEW COMPARISON:  Chest radiograph March 07, 2018 and Feb 12, 2016 FINDINGS: Patient's kyphotic and rotated to the  LEFT. Cardiac silhouette is mildly enlarged unchanged for calcified aortic knob. Chronic interstitial changes. Persistently elevated LEFT hemidiaphragm with LEFT greater than RIGHT bibasilar strandy densities. RIGHT lung pleural thickening. No pneumothorax. Surgical clips LEFT chest wall. Osteopenia. IMPRESSION: Stable elevated LEFT  hemidiaphragm with bibasilar atelectasis/scarring. Mild cardiomegaly. Aortic Atherosclerosis (ICD10-I70.0). Electronically Signed   By: Elon Alas M.D.   On: 03/25/2018 17:32    Procedures Procedures (including critical care time)  Medications Ordered in ED Medications  fosfomycin (MONUROL) packet 3 g (has no administration in time range)  sodium chloride 0.9 % bolus 500 mL (0 mLs Intravenous Stopped 03/25/18 1820)  sodium chloride 0.9 % bolus 500 mL (500 mLs Intravenous New Bag/Given 03/25/18 1950)     Initial Impression / Assessment and Plan / ED Course  I have reviewed the triage vital signs and the nursing notes.  Pertinent labs & imaging results that were available during my care of the patient were reviewed by me and considered in my medical decision making (see chart for details).     8:59 PM Patient smiling, hemodynamically unremarkable, remains afebrile, awake, alert. Urinalysis consistent with infection, but labs otherwise reassuring, no evidence for hemodynamic instability, no evidence for bacteremia or sepsis. As the patient is a urinary tract infection, she will receive fosfomycin. Seemingly the patient is nave to this medication, which should be good for susceptibility of the infection. Patient discharged in stable condition with outpatient follow-up.  Final Clinical Impressions(s) / ED Diagnoses  Urinary tract infection   Carmin Muskrat, MD 03/25/18 2059

## 2018-03-25 NOTE — ED Triage Notes (Addendum)
Family states patient went to doctor today and had a low grade temperature. States blood was drawn and patient sent to ER for WBC - 21.4. Patient denies any pain at triage. Family states patient has been sleeping a lot x 2 days.

## 2018-04-23 ENCOUNTER — Other Ambulatory Visit: Payer: Self-pay

## 2018-04-23 ENCOUNTER — Emergency Department (HOSPITAL_COMMUNITY)
Admission: EM | Admit: 2018-04-23 | Discharge: 2018-04-24 | Disposition: A | Discharge status: Home / Self Care | Payer: Medicare Other | Attending: Emergency Medicine | Admitting: Emergency Medicine

## 2018-04-23 ENCOUNTER — Encounter (HOSPITAL_COMMUNITY): Payer: Self-pay | Admitting: Emergency Medicine

## 2018-04-23 DIAGNOSIS — Z794 Long term (current) use of insulin: Secondary | ICD-10-CM | POA: Diagnosis not present

## 2018-04-23 DIAGNOSIS — N390 Urinary tract infection, site not specified: Secondary | ICD-10-CM | POA: Diagnosis not present

## 2018-04-23 DIAGNOSIS — E1165 Type 2 diabetes mellitus with hyperglycemia: Secondary | ICD-10-CM | POA: Diagnosis not present

## 2018-04-23 DIAGNOSIS — Z79899 Other long term (current) drug therapy: Secondary | ICD-10-CM | POA: Insufficient documentation

## 2018-04-23 DIAGNOSIS — Z7982 Long term (current) use of aspirin: Secondary | ICD-10-CM | POA: Insufficient documentation

## 2018-04-23 DIAGNOSIS — I10 Essential (primary) hypertension: Secondary | ICD-10-CM | POA: Diagnosis not present

## 2018-04-23 DIAGNOSIS — J45909 Unspecified asthma, uncomplicated: Secondary | ICD-10-CM | POA: Diagnosis not present

## 2018-04-23 DIAGNOSIS — I2510 Atherosclerotic heart disease of native coronary artery without angina pectoris: Secondary | ICD-10-CM | POA: Insufficient documentation

## 2018-04-23 DIAGNOSIS — R739 Hyperglycemia, unspecified: Secondary | ICD-10-CM | POA: Diagnosis present

## 2018-04-23 LAB — URINALYSIS, ROUTINE W REFLEX MICROSCOPIC
Bilirubin Urine: NEGATIVE
Glucose, UA: 150 mg/dL — AB
Ketones, ur: NEGATIVE mg/dL
NITRITE: NEGATIVE
PROTEIN: 30 mg/dL — AB
SPECIFIC GRAVITY, URINE: 1.01 (ref 1.005–1.030)
pH: 7 (ref 5.0–8.0)

## 2018-04-23 LAB — BASIC METABOLIC PANEL
Anion gap: 9 (ref 5–15)
BUN: 24 mg/dL — AB (ref 8–23)
CO2: 27 mmol/L (ref 22–32)
Calcium: 9.1 mg/dL (ref 8.9–10.3)
Chloride: 95 mmol/L — ABNORMAL LOW (ref 98–111)
Creatinine, Ser: 0.98 mg/dL (ref 0.44–1.00)
GFR calc Af Amer: 57 mL/min — ABNORMAL LOW (ref >60)
GFR, EST NON AFRICAN AMERICAN: 49 mL/min — AB (ref >60)
Glucose, Bld: 200 mg/dL — ABNORMAL HIGH (ref 70–99)
Potassium: 4.3 mmol/L (ref 3.5–5.1)
SODIUM: 131 mmol/L — AB (ref 135–145)

## 2018-04-23 LAB — CBC
HEMATOCRIT: 28.6 % — AB (ref 36.0–46.0)
HEMOGLOBIN: 8.7 g/dL — AB (ref 12.0–15.0)
MCH: 27.5 pg (ref 26.0–34.0)
MCHC: 30.4 g/dL (ref 30.0–36.0)
MCV: 90.5 fL (ref 78.0–100.0)
Platelets: 410 10*3/uL — ABNORMAL HIGH (ref 150–400)
RBC: 3.16 MIL/uL — ABNORMAL LOW (ref 3.87–5.11)
RDW: 14.3 % (ref 11.5–15.5)
WBC: 16.6 10*3/uL — ABNORMAL HIGH (ref 4.0–10.5)

## 2018-04-23 LAB — CBG MONITORING, ED: GLUCOSE-CAPILLARY: 192 mg/dL — AB (ref 70–99)

## 2018-04-23 MED ORDER — FOSFOMYCIN TROMETHAMINE 3 G PO PACK
3.0000 g | PACK | Freq: Once | ORAL | Status: AC
  Administered 2018-04-24: 3 g via ORAL
  Filled 2018-04-23 (×2): qty 3

## 2018-04-23 NOTE — Discharge Instructions (Addendum)
Results in the ER show urinary infection. We have given antibiotics that should cover the infection - but if the symptoms are getting worse, please return to the ER.  If there is any fevers, chills, inability to keep any medications down, confusion come back in to the Er.

## 2018-04-23 NOTE — ED Provider Notes (Signed)
[facility] EMERGENCY DEPARTMENT Provider Note   CSN: [id number] Arrival date & time: 04/23/18  1926     History   Chief Complaint Chief Complaint  Patient presents with  . Hyperglycemia    HPI [name] is a 84 y.o. female.  HPI  84 year old female comes in with chief complaint of elevated blood sugar. Patient has history of CAD, DM and resides with her daughters who are also the primary caregiver and power of attorney.  The daughters report that after breakfast, patient has gotten more sleepy and did not have supper.  They also noted that her blood sugar was elevated and decided to bring her into the ER for further evaluation.  Patient has had similar symptoms in the past when she gets UTIs.  There is no new cough and patient has not complained of any abdominal pain or shortness of breath to the family.  Patient has a chronic pressure ulcer in the sacrum, wound care team has been managing and the pressure ulcer has improved.  Past Medical History:  Diagnosis Date  . Acid reflux   . Anemia   . Arthritis    ra  . Asthma   . Cancer [facility])    breast cancer  . Coronary artery disease   . Diabetes mellitus without complication ([place])   . Diverticulitis   . Gastric ulcer   . Hyperlipidemia   . Hypertension   . Hypoxemia   . Kyphosis   . Kyphosis   . Mitral valve disorder   . Osteoporosis   . Oxygen dependent    2 liters at night  . Raynaud disease   . Thrombocytopenia [facility])     Patient Active Problem List   Diagnosis Date Noted  . SVT (supraventricular tachycardia) ([place]) 01/06/2018  . Sepsis secondary to UTI ([place]) 01/05/2018  . Hypernatremia 01/05/2018  . Decubitus ulcer of thoracic spine area, stage III ([place]) 01/05/2018  . Decubitus ulcer of sacral region, stage 2 01/05/2018  . Anemia 01/05/2018  . HCAP (healthcare-associated pneumonia) 03/06/2017  . Hyperglycemia 03/03/2017  . Electrolyte abnormality 03/03/2017  . Pressure injury of skin 01/16/2017  .  Dehydration   . Lactic acidosis 07/19/2016  . Asthma, chronic 04/12/2016  . Sepsis ([place]) 03/08/2016  . CAP (community acquired pneumonia) 10/09/2015  . Pressure ulcer 10/09/2015  . Kyphosis   . UTI (urinary tract infection) 06/30/2015  . Breast cancer of upper-outer quadrant of left female breast ([place]) 05/09/2015  . Rheumatoid arthritis ([place]) 10/29/2012  . HTN (hypertension) 10/29/2012  . Diabetes type 2, controlled ([place]) 10/29/2012  . GERD (gastroesophageal reflux disease) 10/29/2012    Past Surgical History:  Procedure Laterality Date  . BREAST SURGERY    . HERNIA REPAIR    . MASTECTOMY Right 1978  . SIMPLE MASTECTOMY WITH AXILLARY SENTINEL NODE BIOPSY Left 05/09/2015   Procedure: LEFT SIMPLE MASTECTOMY;  Surgeon: Erroll Luna, MD;  Location: [place];  Service: General;  Laterality: Left;     OB History    Gravida  4   Para  4   Term  4   Preterm      AB      Living  4     SAB      TAB      Ectopic      Multiple      Live Births               Home Medications    Prior to Admission medications  Medication Sig Start Date End Date Taking? Authorizing Provider  aspirin EC 81 MG tablet Take 81 mg by mouth 2 (two) times daily.    Yes [provider]  atorvastatin (LIPITOR) 10 MG tablet Take 10 mg by mouth every evening.    Yes [provider]  budesonide-formoterol (SYMBICORT) 160-4.5 MCG/ACT inhaler Inhale 2 puffs into the lungs 2 (two) times daily.   Yes [provider]  ciclopirox (PENLAC) 8 % solution APPLY TO FUNGAL TOENAILS EVERY DAY 02/11/16  Yes [provider]  D-MANNOSE PO Take 500 mg by mouth daily.    Yes [provider]  diltiazem (CARDIZEM) 120 MG tablet Take 120 mg by mouth daily.   Yes [provider]  guaiFENesin (MUCINEX) 600 MG 12 hr tablet Take 1 tablet (600 mg total) by mouth 2 (two) times daily. Patient taking differently: Take 600 mg by mouth 2 (two) times daily as needed for cough  or to loosen phlegm.  03/07/17  Yes Kathie Dike, MD  insulin aspart (NOVOLOG) 100 UNIT/ML FlexPen Inject 4 Units into the skin 3 (three) times daily with meals. 03/07/17  Yes Kathie Dike, MD  Insulin Glargine (LANTUS) 100 UNIT/ML Solostar Pen Inject 5 Units into the skin daily. Patient taking differently: Inject 5 Units into the skin at bedtime.  03/07/17  Yes Kathie Dike, MD  ipratropium-albuterol (DUONEB) 0.5-2.5 (3) MG/3ML SOLN Take 3 mLs by nebulization 2 (two) times daily. 03/07/17  Yes Kathie Dike, MD  lactobacillus acidophilus & bulgar (LACTINEX) chewable tablet CHEW 1 TABLET BY MOUTH EVERY DAY 09/17/15  Yes [provider]  magnesium oxide (MAG-OX) 400 (241.3 Mg) MG tablet Take 0.5 tablets (200 mg total) by mouth daily. Patient taking differently: Take 200-400 mg by mouth daily. Alternating 200 mg one day then 400 mg the next. 04/15/16  Yes Reyne Dumas, MD  metFORMIN (GLUCOPHAGE) 500 MG tablet Take 1,000 mg by mouth 2 (two) times daily with a meal.   Yes [provider]  metoprolol succinate (TOPROL-XL) 25 MG 24 hr tablet Take 0.5 tablets (12.5 mg total) by mouth daily. Patient taking differently: Take 25 mg by mouth every morning.  01/11/18  Yes Johnson, Clanford L, MD  Multiple Vitamins-Minerals (CENTRUM SILVER ADULT 50+ PO) Take 1 tablet by mouth every morning.    Yes [provider]  nitrofurantoin (MACRODANTIN) 50 MG capsule Take 50 mg by mouth daily.   Yes [provider]  pantoprazole (PROTONIX) 40 MG tablet Take 40 mg by mouth daily.   Yes [provider]  POLY-IRON 150 FORTE 150-25-1 MG-MCG-MG CAPS TAKE ONE CAPSULE BY MOUTH DAILY 04/07/17  Yes Kefalas, Manon Hilding, PA-C  Polyvinyl Alcohol-Povidone (CLEAR EYES ALL SEASONS) 5-6 MG/ML SOLN Apply 1 drop to eye every morning.    Yes [provider]  predniSONE (DELTASONE) 5 MG tablet Take 1 tablet (5 mg total) by mouth daily with breakfast. 02/12/18  Yes Johnson, Clanford L, MD    raloxifene (EVISTA) 60 MG tablet Take 60 mg by mouth daily.   Yes [provider]  traMADol (ULTRAM) 50 MG tablet Take 50 mg by mouth every 6 (six) hours as needed.   Yes [provider]  benzonatate (TESSALON) 100 MG capsule Take 1 capsule (100 mg total) by mouth 3 (three) times daily. Patient not taking: Reported on 04/23/2018 03/11/18   Heath Lark D, DO  Cholecalciferol (VITAMIN D3) 2000 units capsule Take 2,000 Units by mouth daily.     [provider]  mirtazapine (REMERON)  15 MG tablet Take 0.5 tablets (7.5 mg total) by mouth at bedtime. Patient not taking: Reported on 03/25/2018 02/12/18   Murlean Iba, MD    Family History Family History  Problem Relation Age of Onset  . Asthma Sister   . Rheum arthritis Daughter     Social History Social History   Tobacco Use  . Smoking status: Never Smoker  . Smokeless tobacco: Never Used  Substance Use Topics  . Alcohol use: No  . Drug use: No     Allergies   Aspirin; Megace [megestrol]; and Rocephin [ceftriaxone sodium in dextrose]   Review of Systems Review of Systems  Constitutional: Positive for activity change. Negative for fever.  Respiratory: Negative for shortness of breath and wheezing.   Cardiovascular: Negative for chest pain.  Gastrointestinal: Negative for abdominal pain and vomiting.  Neurological: Negative for headaches.     Physical Exam Updated Vital Signs BP 129/61   Pulse 85   Temp 98.9 F (37.2 C) (Oral)   Resp 18   Ht 5\' 3"  (1.6 m)   Wt 43.1 kg (95 lb)   SpO2 99%   BMI 16.83 kg/m   Physical Exam  Constitutional: No distress.  HENT:  Head: Atraumatic.  Eyes: EOM are normal.  Cardiovascular: Normal rate.  Pulmonary/Chest: Effort normal. She has no wheezes. She has no rales.  Abdominal: Soft. Bowel sounds are normal. There is no tenderness.  Neurological: She is alert.  Skin: Skin is warm.  Nursing note and vitals reviewed.    ED Treatments / Results   Labs (all labs ordered are listed, but only abnormal results are displayed) Labs Reviewed  BASIC METABOLIC PANEL - Abnormal; Notable for the following components:      Result Value   Sodium 131 (*)    Chloride 95 (*)    Glucose, Bld 200 (*)    BUN 24 (*)    GFR calc non Af Amer 49 (*)    GFR calc Af Amer 57 (*)    All other components within normal limits  CBC - Abnormal; Notable for the following components:   WBC 16.6 (*)    RBC 3.16 (*)    Hemoglobin 8.7 (*)    HCT 28.6 (*)    Platelets 410 (*)    All other components within normal limits  URINALYSIS, ROUTINE W REFLEX MICROSCOPIC - Abnormal; Notable for the following components:   APPearance CLOUDY (*)    Glucose, UA 150 (*)    Hgb urine dipstick SMALL (*)    Protein, ur 30 (*)    Leukocytes, UA LARGE (*)    WBC, UA >50 (*)    Bacteria, UA RARE (*)    All other components within normal limits  CBG MONITORING, ED - Abnormal; Notable for the following components:   Glucose-Capillary 192 (*)    All other components within normal limits  URINE CULTURE    EKG None  Radiology No results found.  Procedures Procedures (including critical care time)  Medications Ordered in ED Medications  fosfomycin (MONUROL) packet 3 g (3 g Oral Given 04/24/18 0006)     Initial Impression / Assessment and Plan / ED Course  I have reviewed the triage vital signs and the nursing notes.  Pertinent labs & imaging results that were available during my care of the patient were reviewed by me and considered in my medical decision making (see chart for details).     84 year old female comes in with chief complaint of  elevated blood sugar.  She has history of diabetes, asthma, CAD and has been also acting somnolent.  According to family patient has had similar symptoms in the past with UTIs -and our suspicion is highest for UTI.  Fortunately, family endorses that patient is more alert than she was at home.  Patient responds to all of my  questions appropriately and she has no complaints from her side.  UA is pending.  Patient has elevated blood sugar but there is no DKA.   Wound check will be completed prior to disposition.  12:33 AM UA is concerning for infection.  Based on patient's prior susceptibilities, the only option we had for home treatment is fosfomycin which has been administered here.  According to patient's family she has had treatment with fosfomycin in the past, therefore I am optimistic that her symptoms will resolve.  Patient is stable for discharge to home.  She is alert, responding appropriately to all of her questions.  Patient lives with family, who are also comfortable taking her home.  Strict ER return precautions have been discussed with the family, and they will return to the ER if patient's symptoms get worse.  Final Clinical Impressions(s) / ED Diagnoses   Final diagnoses:  Lower urinary tract infectious disease    ED Discharge Orders    None       Varney Biles, MD 04/24/18 [id number]

## 2018-04-23 NOTE — ED Triage Notes (Signed)
Daughters with pt, states bs was 336 at 3pm today and sleeping more. Pt alert/oriented to most and no lethargy noted. Denies urinary frequency.

## 2018-04-24 DIAGNOSIS — N390 Urinary tract infection, site not specified: Secondary | ICD-10-CM | POA: Diagnosis not present

## 2018-04-25 LAB — URINE CULTURE: Culture: 10000 — AB

## 2018-04-26 ENCOUNTER — Telehealth: Payer: Self-pay | Admitting: Emergency Medicine

## 2018-04-26 NOTE — Telephone Encounter (Signed)
Post ED Visit - Positive Culture Follow-up  Culture report reviewed by antimicrobial stewardship pharmacist:  []  Elenor Quinones, Pharm.D. []  Heide Guile, Pharm.D., BCPS AQ-ID []  Parks Neptune, Pharm.D., BCPS []  Alycia Rossetti, Pharm.D., BCPS []  [place], Pharm.D., BCPS, AAHIVP []  Legrand [place]o, Pharm.D., BCPS, AAHIVP [x]  Salome Arnt, PharmD, BCPS []  Johnnette Gourd, PharmD, BCPS []  Hughes Better, PharmD, BCPS []  Leeroy Cha, PharmD  Positive urine culture Treated with none, asymptomatic,no further patient follow-up is required at this time.  Hazle Nordmann 04/26/2018, 11:45 AM

## 2018-06-07 ENCOUNTER — Emergency Department (HOSPITAL_COMMUNITY): Payer: Medicare Other

## 2018-06-07 ENCOUNTER — Other Ambulatory Visit: Payer: Self-pay

## 2018-06-07 ENCOUNTER — Emergency Department (HOSPITAL_COMMUNITY)
Admission: EM | Admit: 2018-06-07 | Discharge: 2018-06-08 | Disposition: A | Discharge status: Home / Self Care | Payer: Medicare Other | Attending: Emergency Medicine | Admitting: Emergency Medicine

## 2018-06-07 ENCOUNTER — Encounter (HOSPITAL_COMMUNITY): Payer: Self-pay | Admitting: Emergency Medicine

## 2018-06-07 DIAGNOSIS — J45909 Unspecified asthma, uncomplicated: Secondary | ICD-10-CM | POA: Insufficient documentation

## 2018-06-07 DIAGNOSIS — R5383 Other fatigue: Secondary | ICD-10-CM | POA: Insufficient documentation

## 2018-06-07 DIAGNOSIS — R531 Weakness: Secondary | ICD-10-CM

## 2018-06-07 DIAGNOSIS — I2510 Atherosclerotic heart disease of native coronary artery without angina pectoris: Secondary | ICD-10-CM | POA: Diagnosis not present

## 2018-06-07 DIAGNOSIS — Z7982 Long term (current) use of aspirin: Secondary | ICD-10-CM | POA: Insufficient documentation

## 2018-06-07 DIAGNOSIS — I10 Essential (primary) hypertension: Secondary | ICD-10-CM | POA: Diagnosis not present

## 2018-06-07 DIAGNOSIS — Z79899 Other long term (current) drug therapy: Secondary | ICD-10-CM | POA: Insufficient documentation

## 2018-06-07 DIAGNOSIS — E119 Type 2 diabetes mellitus without complications: Secondary | ICD-10-CM | POA: Diagnosis not present

## 2018-06-07 DIAGNOSIS — R739 Hyperglycemia, unspecified: Secondary | ICD-10-CM

## 2018-06-07 DIAGNOSIS — Z794 Long term (current) use of insulin: Secondary | ICD-10-CM | POA: Diagnosis not present

## 2018-06-07 DIAGNOSIS — N390 Urinary tract infection, site not specified: Secondary | ICD-10-CM

## 2018-06-07 LAB — URINALYSIS, ROUTINE W REFLEX MICROSCOPIC
BILIRUBIN URINE: NEGATIVE
Glucose, UA: 50 mg/dL — AB
Hgb urine dipstick: NEGATIVE
KETONES UR: NEGATIVE mg/dL
Nitrite: NEGATIVE
Protein, ur: 100 mg/dL — AB
Specific Gravity, Urine: 1.012 (ref 1.005–1.030)
pH: 5 (ref 5.0–8.0)

## 2018-06-07 LAB — CBC WITH DIFFERENTIAL/PLATELET
BASOS ABS: 0 10*3/uL (ref 0.0–0.1)
Basophils Relative: 0 %
EOS ABS: 0 10*3/uL (ref 0.0–0.7)
EOS PCT: 0 %
HCT: 27.6 % — ABNORMAL LOW (ref 36.0–46.0)
Hemoglobin: 8.6 g/dL — ABNORMAL LOW (ref 12.0–15.0)
Lymphocytes Relative: 15 %
Lymphs Abs: 2.9 10*3/uL (ref 0.7–4.0)
MCH: 27 pg (ref 26.0–34.0)
MCHC: 31.2 g/dL (ref 30.0–36.0)
MCV: 86.8 fL (ref 78.0–100.0)
Monocytes Absolute: 1.2 10*3/uL — ABNORMAL HIGH (ref 0.1–1.0)
Monocytes Relative: 6 %
Neutro Abs: 14.9 10*3/uL — ABNORMAL HIGH (ref 1.7–7.7)
Neutrophils Relative %: 79 %
PLATELETS: 397 10*3/uL (ref 150–400)
RBC: 3.18 MIL/uL — AB (ref 3.87–5.11)
RDW: 14 % (ref 11.5–15.5)
WBC: 19.1 10*3/uL — AB (ref 4.0–10.5)

## 2018-06-07 LAB — COMPREHENSIVE METABOLIC PANEL
ALBUMIN: 2.4 g/dL — AB (ref 3.5–5.0)
ALT: 10 U/L (ref 0–44)
AST: 14 U/L — AB (ref 15–41)
Alkaline Phosphatase: 61 U/L (ref 38–126)
Anion gap: 8 (ref 5–15)
BUN: 19 mg/dL (ref 8–23)
CHLORIDE: 100 mmol/L (ref 98–111)
CO2: 25 mmol/L (ref 22–32)
CREATININE: 0.92 mg/dL (ref 0.44–1.00)
Calcium: 8.8 mg/dL — ABNORMAL LOW (ref 8.9–10.3)
GFR calc Af Amer: >60 mL/min (ref >60)
GFR calc non Af Amer: 53 mL/min — ABNORMAL LOW (ref >60)
Glucose, Bld: 207 mg/dL — ABNORMAL HIGH (ref 70–99)
Potassium: 5.3 mmol/L — ABNORMAL HIGH (ref 3.5–5.1)
SODIUM: 133 mmol/L — AB (ref 135–145)
Total Bilirubin: 0.5 mg/dL (ref 0.3–1.2)
Total Protein: 6.4 g/dL — ABNORMAL LOW (ref 6.5–8.1)

## 2018-06-07 LAB — LIPASE, BLOOD: LIPASE: 26 U/L (ref 11–51)

## 2018-06-07 LAB — TROPONIN I: Troponin I: 0.03 ng/mL (ref <0.03)

## 2018-06-07 LAB — LACTIC ACID, PLASMA
LACTIC ACID, VENOUS: 2.5 mmol/L — AB (ref 0.5–1.9)
LACTIC ACID, VENOUS: 2 mmol/L — AB (ref 0.5–1.9)

## 2018-06-07 MED ORDER — SODIUM CHLORIDE 0.9 % IV BOLUS
250.0000 mL | Freq: Once | INTRAVENOUS | Status: AC
  Administered 2018-06-07: 250 mL via INTRAVENOUS

## 2018-06-07 MED ORDER — FOSFOMYCIN TROMETHAMINE 3 G PO PACK
3.0000 g | PACK | Freq: Once | ORAL | Status: AC
  Administered 2018-06-07: 3 g via ORAL
  Filled 2018-06-07 (×2): qty 3

## 2018-06-07 MED ORDER — SODIUM CHLORIDE 0.9 % IV SOLN
INTRAVENOUS | Status: DC
  Administered 2018-06-07: 20:00:00 via INTRAVENOUS

## 2018-06-07 NOTE — ED Triage Notes (Signed)
Daughters state pt has not quite herself today.  Did not eat lunch today and her glucose was elevated earlier.  Pt denies any complaints.  Hx of chronic uti.

## 2018-06-07 NOTE — ED Notes (Addendum)
Patient unable to stand for orthostatic vital signs.

## 2018-06-07 NOTE — ED Notes (Signed)
Pt already urinated when we went in the room to do the in and out cath.

## 2018-06-07 NOTE — ED Notes (Signed)
Patient taking oral fluids

## 2018-06-07 NOTE — ED Provider Notes (Signed)
[facility] EMERGENCY DEPARTMENT Provider Note   CSN: [id number] Arrival date & time: 06/07/18  1617     History   Chief Complaint Chief Complaint  Patient presents with  . Weakness    HPI [name] is a 84 y.o. female.   Weakness     Pt was seen at 1840.  Per pt's family: Pt with gradual onset and persistence of multiple intermittent episodes of "high blood sugars" at home for the past 1 week. States CBG's have been "in the 300's." Has been associated with poor PO intake and "not being quite herself." Family has been given RISS insulin with transient improvement. Family endorses pt has "chronic UTI's" which has previously accounted for these same symptoms. Denies N/V/D, no CP/SOB, no cough, no abd pain.    Past Medical History:  Diagnosis Date  . Acid reflux   . Anemia   . Arthritis    ra  . Asthma   . Cancer [facility])    breast cancer  . Coronary artery disease   . Diabetes mellitus without complication ([place])   . Diverticulitis   . Gastric ulcer   . Hyperlipidemia   . Hypertension   . Hypoxemia   . Kyphosis   . Kyphosis   . Mitral valve disorder   . Osteoporosis   . Oxygen dependent    2 liters at night  . Raynaud disease   . Thrombocytopenia [facility])     Patient Active Problem List   Diagnosis Date Noted  . SVT (supraventricular tachycardia) ([place]) 01/06/2018  . Sepsis secondary to UTI ([place]) 01/05/2018  . Hypernatremia 01/05/2018  . Decubitus ulcer of thoracic spine area, stage III ([place]) 01/05/2018  . Decubitus ulcer of sacral region, stage 2 01/05/2018  . Anemia 01/05/2018  . HCAP (healthcare-associated pneumonia) 03/06/2017  . Hyperglycemia 03/03/2017  . Electrolyte abnormality 03/03/2017  . Pressure injury of skin 01/16/2017  . Dehydration   . Lactic acidosis 07/19/2016  . Asthma, chronic 04/12/2016  . Sepsis ([place]) 03/08/2016  . CAP (community acquired pneumonia) 10/09/2015  . Pressure ulcer 10/09/2015  . Kyphosis   . UTI (urinary tract  infection) 06/30/2015  . Breast cancer of upper-outer quadrant of left female breast ([place]) 05/09/2015  . Rheumatoid arthritis ([place]) 10/29/2012  . HTN (hypertension) 10/29/2012  . Diabetes type 2, controlled ([place]) 10/29/2012  . GERD (gastroesophageal reflux disease) 10/29/2012    Past Surgical History:  Procedure Laterality Date  . BREAST SURGERY    . HERNIA REPAIR    . MASTECTOMY Right 1978  . SIMPLE MASTECTOMY WITH AXILLARY SENTINEL NODE BIOPSY Left 05/09/2015   Procedure: LEFT SIMPLE MASTECTOMY;  Surgeon: Erroll Luna, MD;  Location: [facility];  Service: General;  Laterality: Left;     OB History    Gravida  4   Para  4   Term  4   Preterm      AB      Living  4     SAB      TAB      Ectopic      Multiple      Live Births               Home Medications    Prior to Admission medications   Medication Sig Start Date End Date Taking? Authorizing Provider  aspirin EC 81 MG tablet Take 81 mg by mouth 2 (two) times daily.    Yes [provider]  atorvastatin (LIPITOR) 10 MG tablet Take 10 mg  by mouth every evening.    Yes [provider]  budesonide-formoterol (SYMBICORT) 160-4.5 MCG/ACT inhaler Inhale 2 puffs into the lungs 2 (two) times daily.   Yes [provider]  Cholecalciferol (VITAMIN D3) 2000 units capsule Take 2,000 Units by mouth daily.    Yes [provider]  D-MANNOSE PO Take 500 mg by mouth daily.    Yes [provider]  diltiazem (CARDIZEM CD) 120 MG 24 hr capsule Take 120 mg by mouth daily.   Yes [provider]  fluconazole (DIFLUCAN) 50 MG tablet Take 50 mg by mouth See admin instructions. Take 1 tablet by mouth every other day starting 06/01/2018 (#3 tabs)   Yes [provider]  guaiFENesin (MUCINEX) 600 MG 12 hr tablet Take 1 tablet (600 mg total) by mouth 2 (two) times daily. Patient taking differently: Take 600 mg by mouth 2 (two) times daily as needed for cough or to loosen phlegm.   03/07/17  Yes Kathie Dike, MD  insulin aspart (NOVOLOG) 100 UNIT/ML FlexPen Inject 4 Units into the skin 3 (three) times daily with meals. 03/07/17  Yes Kathie Dike, MD  Insulin Glargine (LANTUS) 100 UNIT/ML Solostar Pen Inject 5 Units into the skin daily. Patient taking differently: Inject 5 Units into the skin at bedtime.  03/07/17  Yes Kathie Dike, MD  ipratropium-albuterol (DUONEB) 0.5-2.5 (3) MG/3ML SOLN Take 3 mLs by nebulization 2 (two) times daily. 03/07/17  Yes Kathie Dike, MD  lactobacillus acidophilus & bulgar (LACTINEX) chewable tablet CHEW 1 TABLET BY MOUTH EVERY DAY 09/17/15  Yes [provider]  magnesium oxide (MAG-OX) 400 (241.3 Mg) MG tablet Take 0.5 tablets (200 mg total) by mouth daily. Patient taking differently: Take 200-400 mg by mouth daily. Alternating 200 mg one day then 400 mg the next. 04/15/16  Yes Reyne Dumas, MD  metFORMIN (GLUCOPHAGE) 500 MG tablet Take 1,000 mg by mouth 2 (two) times daily with a meal.   Yes [provider]  metoprolol succinate (TOPROL-XL) 25 MG 24 hr tablet Take 0.5 tablets (12.5 mg total) by mouth daily. Patient taking differently: Take 25 mg by mouth every morning.  01/11/18  Yes Johnson, Clanford L, MD  Multiple Vitamins-Minerals (CENTRUM SILVER ADULT 50+ PO) Take 1 tablet by mouth every morning.    Yes [provider]  pantoprazole (PROTONIX) 40 MG tablet Take 40 mg by mouth daily.   Yes [provider]  POLY-IRON 150 FORTE 150-25-1 MG-MCG-MG CAPS TAKE ONE CAPSULE BY MOUTH DAILY 04/07/17  Yes Kefalas, Manon Hilding, PA-C  Polyvinyl Alcohol-Povidone (CLEAR EYES ALL SEASONS) 5-6 MG/ML SOLN Apply 1 drop to eye every morning.    Yes [provider]  predniSONE (DELTASONE) 5 MG tablet Take 1 tablet (5 mg total) by mouth daily with breakfast. 02/12/18  Yes Johnson, Clanford L, MD  raloxifene (EVISTA) 60 MG tablet Take 60 mg by mouth daily.   Yes [provider]  traMADol (ULTRAM) 50 MG tablet Take  50 mg by mouth every 6 (six) hours as needed for moderate pain.    Yes [provider]  zinc gluconate 50 MG tablet Take 50 mg by mouth daily.   Yes [provider]  benzonatate (TESSALON) 100 MG capsule Take 1 capsule (100 mg total) by mouth 3 (three) times daily. Patient not taking: Reported on 04/23/2018 03/11/18   Heath Lark D, DO  mirtazapine (REMERON) 15 MG tablet Take 0.5 tablets (7.5 mg total) by mouth at bedtime. Patient not taking: Reported on 03/25/2018 02/12/18  Murlean Iba, MD    Family History Family History  Problem Relation Age of Onset  . Asthma Sister   . Rheum arthritis Daughter     Social History Social History   Tobacco Use  . Smoking status: Never Smoker  . Smokeless tobacco: Never Used  Substance Use Topics  . Alcohol use: No  . Drug use: No     Allergies   Aspirin; Megace [megestrol]; and Rocephin [ceftriaxone sodium in dextrose]   Review of Systems Review of Systems  Neurological: Positive for weakness.  ROS: Statement: All systems negative except as marked or noted in the HPI; Constitutional: Negative for fever and chills. +generalized weakness, poor PO intake, elevated CBG's.; ; Eyes: Negative for eye pain, redness and discharge. ; ; ENMT: Negative for ear pain, hoarseness, nasal congestion, sinus pressure and sore throat. ; ; Cardiovascular: Negative for chest pain, palpitations, diaphoresis, dyspnea and peripheral edema. ; ; Respiratory: Negative for cough, wheezing and stridor. ; ; Gastrointestinal: Negative for nausea, vomiting, diarrhea, abdominal pain, blood in stool, hematemesis, jaundice and rectal bleeding. . ; ; Genitourinary: Negative for dysuria, flank pain and hematuria. ; ; Musculoskeletal: Negative for back pain and neck pain. Negative for swelling and trauma.; ; Skin: Negative for pruritus, rash, abrasions, blisters, bruising and skin lesion.; ; Neuro: Negative for headache, lightheadedness and neck stiffness.  Negative for altered level of consciousness, altered mental status, extremity weakness, paresthesias, involuntary movement, seizure and syncope.        Physical Exam Updated Vital Signs BP (!) 102/55 (BP Location: Right Arm)   Pulse 81   Temp 98.6 F (37 C) (Oral)   Resp 17   Wt 49.9 kg   SpO2 99%   BMI 19.49 kg/m   Patient Vitals for the past 24 hrs:  BP Temp Temp src Pulse Resp SpO2 Weight  06/07/18 2000 (!) 110/57 - - 82 18 92 % -  06/07/18 1945 - - - - 17 - -  06/07/18 1933 (!) 102/55 - - 81 17 99 % -  06/07/18 1931 104/64 - - 81 17 99 % -  06/07/18 1930 104/64 - - 81 17 99 % -  06/07/18 1915 - - - 82 18 98 % -  06/07/18 1900 (!) 102/45 - - 80 17 96 % -  06/07/18 1825 (!) 111/57 - - 82 20 98 % -  06/07/18 1641 - - - - - - 49.9 kg  06/07/18 1640 (!) 96/58 98.6 F (37 C) Oral 88 (!) 24 98 % -     Physical Exam 1845: Physical examination:  Nursing notes reviewed; Vital signs and O2 SAT reviewed;  Constitutional: Thin, frail. In no acute distress; Head:  Normocephalic, atraumatic; Eyes: EOMI, PERRL, No scleral icterus; ENMT: Mouth and pharynx normal, Mucous membranes dry; Neck: Chronic head tilted left, per family..;; Cardiovascular: Regular rate and rhythm, No gallop; Respiratory: Breath sounds clear & equal bilaterally, No wheezes.  Normal respiratory effort/excursion; Chest: Nontender, Movement normal; Abdomen: Soft, Nontender, Nondistended, Normal bowel sounds; Genitourinary: No CVA tenderness; Extremities: Peripheral pulses normal, No tenderness, No edema, No calf edema or asymmetry.; Neuro: Awake, alert, will answer questions when asked. No facial droop. Speech clear. Moves all extremities on stretcher spontaneously..; Skin: Color normal, Warm, Dry.   ED Treatments / Results  Labs (all labs ordered are listed, but only abnormal results are displayed)   EKG EKG Interpretation  Date/Time:  Monday June 07 2018 18:32:12 EDT Ventricular Rate:  86 PR Interval:  QRS Duration: 93 QT Interval:  376 QTC Calculation: 450 R Axis:   -11 Text Interpretation:  Sinus rhythm Abnormal R-wave progression, early transition Left ventricular hypertrophy Nonspecific ST and T wave abnormality Anterior leads When compared with ECG of 03/07/2018 No significant change was found Confirmed by Francine Graven [phone]) on 06/07/2018 7:06:17 PM   Radiology   Procedures Procedures (including critical care time)  Medications Ordered in ED Medications - No data to display   Initial Impression / Assessment and Plan / ED Course  I have reviewed the triage vital signs and the nursing notes.  Pertinent labs & imaging results that were available during my care of the patient were reviewed by me and considered in my medical decision making (see chart for details).  MDM Reviewed: previous chart, nursing note and vitals Reviewed previous: labs and ECG Interpretation: labs, ECG, x-ray and CT scan   Results for orders placed or performed during the hospital encounter of 06/07/18  Comprehensive metabolic panel  Result Value Ref Range   Sodium 133 (L) 135 - 145 mmol/L   Potassium 5.3 (H) 3.5 - 5.1 mmol/L   Chloride 100 98 - 111 mmol/L   CO2 25 22 - 32 mmol/L   Glucose, Bld 207 (H) 70 - 99 mg/dL   BUN 19 8 - 23 mg/dL   Creatinine, Ser 0.92 0.44 - 1.00 mg/dL   Calcium 8.8 (L) 8.9 - 10.3 mg/dL   Total Protein 6.4 (L) 6.5 - 8.1 g/dL   Albumin 2.4 (L) 3.5 - 5.0 g/dL   AST 14 (L) 15 - 41 U/L   ALT 10 0 - 44 U/L   Alkaline Phosphatase 61 38 - 126 U/L   Total Bilirubin 0.5 0.3 - 1.2 mg/dL   GFR calc non Af Amer 53 (L) >60 mL/min   GFR calc Af Amer >60 >60 mL/min   Anion gap 8 5 - 15  Urinalysis, Routine w reflex microscopic  Result Value Ref Range   Color, Urine AMBER (A) YELLOW   APPearance TURBID (A) CLEAR   Specific Gravity, Urine 1.012 1.005 - 1.030   pH 5.0 5.0 - 8.0   Glucose, UA 50 (A) NEGATIVE mg/dL   Hgb urine dipstick NEGATIVE NEGATIVE   Bilirubin Urine  NEGATIVE NEGATIVE   Ketones, ur NEGATIVE NEGATIVE mg/dL   Protein, ur 100 (A) NEGATIVE mg/dL   Nitrite NEGATIVE NEGATIVE   Leukocytes, UA LARGE (A) NEGATIVE   RBC / HPF 21-50 0 - 5 RBC/hpf   WBC, UA >50 (H) 0 - 5 WBC/hpf   Bacteria, UA RARE (A) NONE SEEN   Squamous Epithelial / LPF 6-10 0 - 5   WBC Clumps PRESENT    Non Squamous Epithelial 0-5 (A) NONE SEEN  Lactic acid, plasma  Result Value Ref Range   Lactic Acid, Venous 2.5 (HH) 0.5 - 1.9 mmol/L  Troponin I  Result Value Ref Range   Troponin I 0.03 (HH) <0.03 ng/mL  Lipase, blood  Result Value Ref Range   Lipase 26 11 - 51 U/L   Ct Head Wo Contrast Result Date: 06/07/2018 CLINICAL DATA:  Altered mental status EXAM: CT HEAD WITHOUT CONTRAST TECHNIQUE: Contiguous axial images were obtained from the base of the skull through the vertex without intravenous contrast. COMPARISON:  01/05/2018 head CT FINDINGS: Brain: No acute territorial infarction, hemorrhage, or intracranial mass. Moderate atrophy. Mild small vessel ischemic changes of the white matter. Stable ventricle size. Vascular: No hyperdense vessels.  Carotid vascular calcification Skull: Normal. Negative for  fracture or focal lesion. Sinuses/Orbits: No acute finding. Other: None IMPRESSION: 1. No CT evidence for acute intracranial abnormality. 2. Atrophy and small vessel ischemic changes of the white matter Electronically Signed   By: Donavan Foil M.D.   On: 06/07/2018 19:31   Dg Chest Port 1 View Result Date: 06/07/2018 CLINICAL DATA:  Weakness and lethargy EXAM: PORTABLE CHEST 1 VIEW COMPARISON:  03/25/2018, 03/07/2018, 03/04/2017 FINDINGS: Study is limited by positioning. The right lung is grossly clear. Persistent elevation of left diaphragm with basilar opacity. Stable cardiomediastinal silhouette with aortic atherosclerosis. No pneumothorax. IMPRESSION: 1. No significant interval change in elevated left diaphragm with left basilar airspace disease. Electronically Signed   By: Donavan Foil M.D.   On: 06/07/2018 19:24    2155:  Judicious IVF given for initial soft BP. Pt's family states pt's BP's since that initial BP "are normal for her." Pt remains awake/alert, resps easy. Pt remains afebrile. Glucose mildly elevated, but AG is normal. Troponin top normal, no change in EKG from previous and pt denies CP. +UTI, UC pending; will tx while in the ED. CBC needs to be re-drawn. PO challenge also pending. Admission vs d/c discussed with family. Pt's family wants to take pt home and avoid admission (they are pt's primary caregivers and POA). They are willing to wait for CBC results. Sign out to Dr. Eulis Foster.      Final Clinical Impressions(s) / ED Diagnoses   Final diagnoses:  Lethargy  Weakness    ED Discharge Orders    None       Francine Graven, DO 06/07/18 2158

## 2018-06-07 NOTE — ED Notes (Signed)
Date and time results received: 06/07/18  21:00  Test: Lactic  Critical Value:2.5  Name of Provider Notified: [facility]Manus  Orders Received? Physician notified

## 2018-06-07 NOTE — ED Notes (Signed)
Date and time results received: 06/07/18 2258 (use smartphrase ".now" to insert current time)  Test: lactic Critical Value: 2.0  Name of Provider Notified: Dr. Eulis Foster @ 2258  Orders Received? Or Actions Taken?: no/na

## 2018-06-07 NOTE — ED Notes (Signed)
Date and time results received: 06/07/18  20:56  Test: Troponin  Critical Value: 0.03  Name of Provider Notified: [facility]  Orders Received? Or Actions Taken?: [employer]

## 2018-06-07 NOTE — ED Notes (Signed)
Purewick put in place 

## 2018-06-08 NOTE — ED Provider Notes (Signed)
I was asked by Dr. Thurnell Garbe to evaluate the patient after return of CBC.  CBC indicates elevated white count, somewhat higher than recent baseline.  Repeat lactate borderline elevated at 2.0.  Down from 2.5.  Patient has been treated for UTI with Rocephin.   Patient Vitals for the past 24 hrs:  BP Temp Temp src Pulse Resp SpO2 Weight  06/07/18 2300 130/73 - - - 16 100 % -  06/07/18 2230 129/65 - - - 16 - -  06/07/18 2215 - - - - 17 - -  06/07/18 2200 (!) 114/57 - - - 16 - -  06/07/18 2145 - - - - 18 - -  06/07/18 2130 (!) 107/51 - - - 17 - -  06/07/18 2115 - - - - 18 - -  06/07/18 2100 (!) 118/50 - - - 18 - -  06/07/18 2045 - - - - 18 - -  06/07/18 2030 (!) 129/53 - - - 16 - -  06/07/18 2015 - - - 80 17 99 % -  06/07/18 2000 (!) 110/57 - - 82 18 92 % -  06/07/18 1945 - - - - 17 - -  06/07/18 1933 (!) 102/55 - - 81 17 99 % -  06/07/18 1931 104/64 - - 81 17 99 % -  06/07/18 1930 104/64 - - 81 17 99 % -  06/07/18 1915 - - - 82 18 98 % -  06/07/18 1900 (!) 102/45 - - 80 17 96 % -  06/07/18 1825 (!) 111/57 - - 82 20 98 % -  06/07/18 1641 - - - - - - 49.9 kg  06/07/18 1640 (!) 96/58 98.6 F (37 C) Oral 88 (!) 24 98 % -    12:08 AM Reevaluation with update and discussion. After initial assessment and treatment, an updated evaluation reveals he is resting comfortably at this time.  Findings discussed with family members, patient has tolerated Monurol orally, all questions answered. Daleen Bo   Medical Decision Making: In status, nonspecific, with abnormal urinalysis, possibly consistent with UTI.  Family members desire to take her home and care for her there.      Daleen Bo, MD 06/08/18 [phone]

## 2018-06-08 NOTE — Discharge Instructions (Addendum)
Encourage her to drink a lot of water and try to get plenty of food in.  Follow-up with your PCP in about a week.

## 2018-06-09 LAB — URINE CULTURE: Culture: >=100000 — AB

## 2018-06-10 ENCOUNTER — Telehealth: Payer: Self-pay | Admitting: Emergency Medicine

## 2018-06-10 NOTE — Telephone Encounter (Signed)
Post ED Visit - Positive Culture Follow-up  Culture report reviewed by antimicrobial stewardship pharmacist:  []  Elenor Quinones, Pharm.D. []  Heide Guile, Pharm.D., BCPS AQ-ID []  Parks Neptune, Pharm.D., BCPS []  Alycia Rossetti, Pharm.D., BCPS []  [place], [place].D., BCPS, AAHIVP []  Legrand Como, Pharm.D., BCPS, AAHIVP [x]  Salome Arnt, PharmD, BCPS []  Johnnette Gourd, PharmD, BCPS []  Hughes Better, PharmD, BCPS []  Leeroy Cha, PharmD  Positive urine culture Treated with fluconazole and fosfomycin, organism sensitive to the same and no further patient follow-up is required at this time.  Hazle Nordmann 06/10/2018, 10:30 AM

## 2018-06-26 IMAGING — CR DG CHEST 1V PORT
1 series · 1 of 1 positions shown · non-contrast
Comparison: 7900 hours

CLINICAL DATA: Jugular catheter placement

EXAM:
PORTABLE CHEST 1 VIEW

[portable]
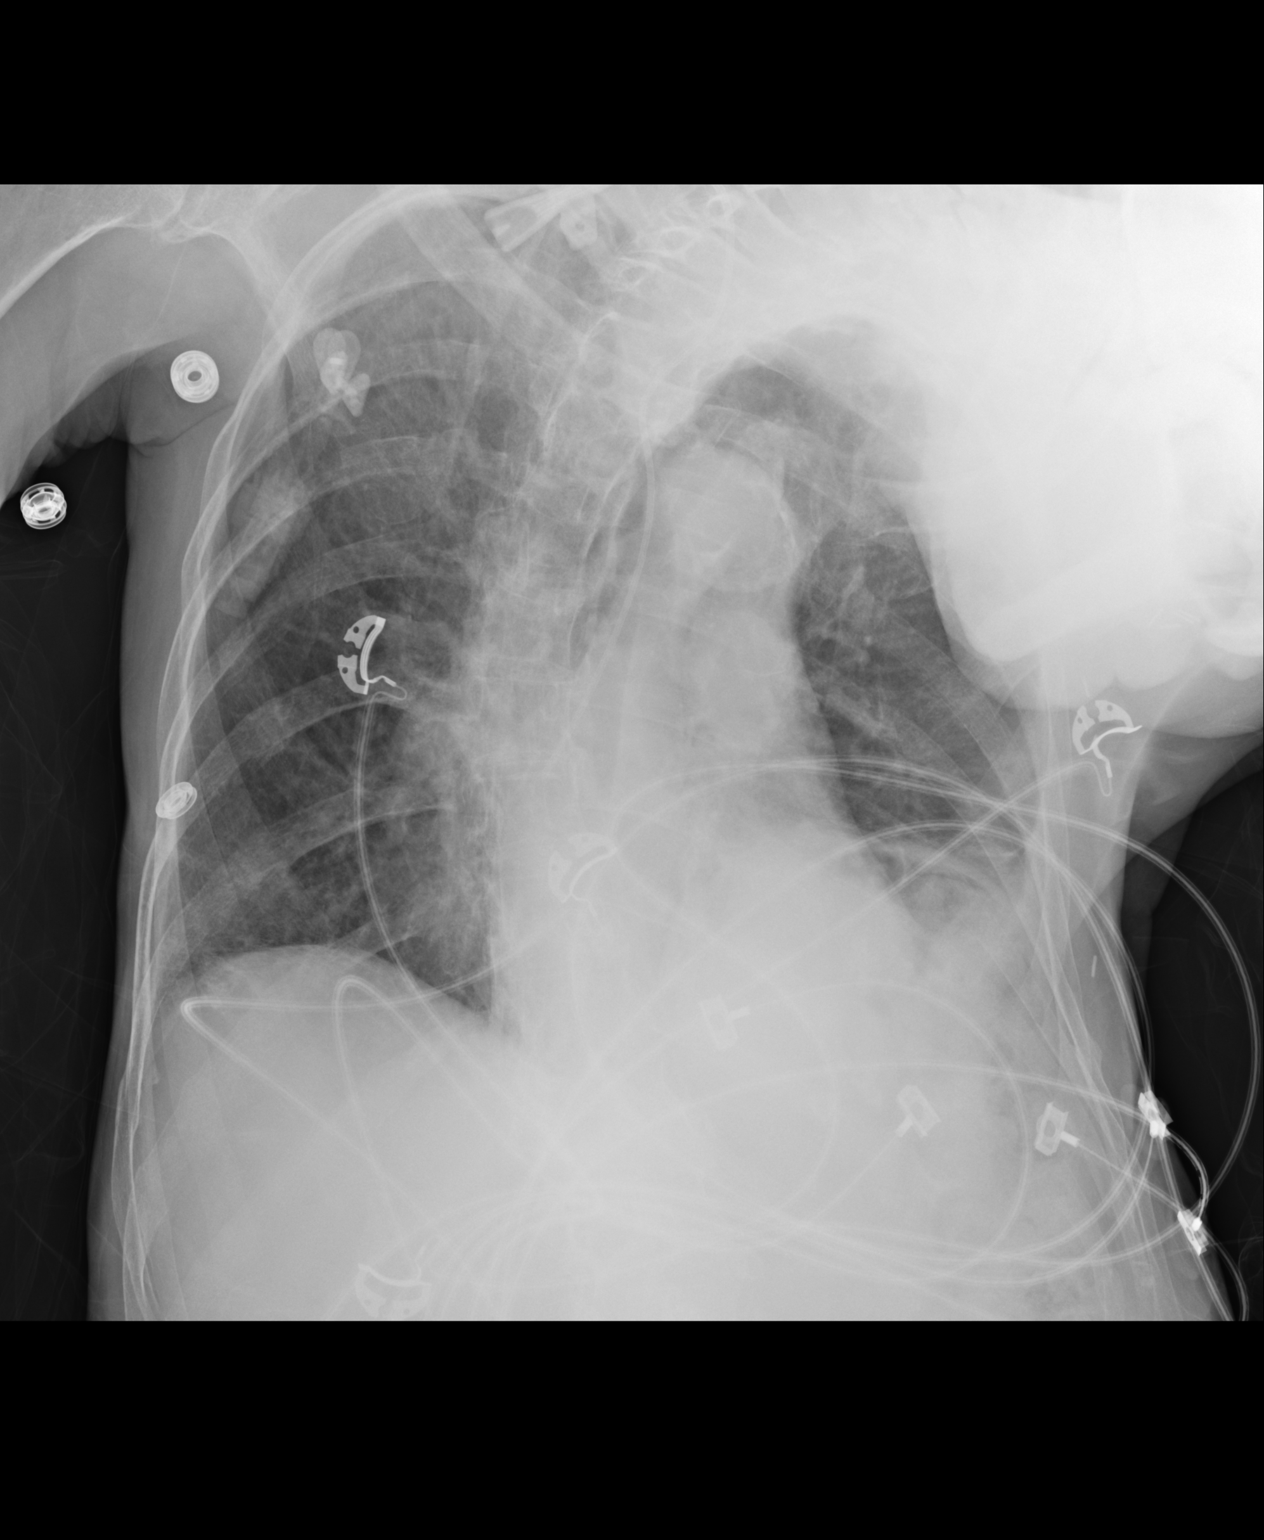

[1 of 1 positions shown; findings below may reference images not displayed]

FINDINGS: Right jugular central venous catheter has been placed. The tip
projects over the cavoatrial junction. Thorax is rotated to the
left. No obvious pneumothorax. Left basilar atelectasis versus
airspace disease is stable.
IMPRESSION: Right jugular venous catheter with its tip projecting over the
cavoatrial junction. No pneumothorax.

## 2018-06-29 ENCOUNTER — Emergency Department (HOSPITAL_COMMUNITY): Payer: Medicare Other

## 2018-06-29 ENCOUNTER — Inpatient Hospital Stay (HOSPITAL_COMMUNITY)
Admission: EM | Admit: 2018-06-29 | Discharge: 2018-07-05 | DRG: 871 | Disposition: A | Discharge status: Home / Self Care | Payer: Medicare Other | Attending: Internal Medicine | Admitting: Internal Medicine

## 2018-06-29 ENCOUNTER — Encounter (HOSPITAL_COMMUNITY): Payer: Self-pay | Admitting: Emergency Medicine

## 2018-06-29 ENCOUNTER — Other Ambulatory Visit: Payer: Self-pay

## 2018-06-29 DIAGNOSIS — K219 Gastro-esophageal reflux disease without esophagitis: Secondary | ICD-10-CM | POA: Diagnosis present

## 2018-06-29 DIAGNOSIS — Z7951 Long term (current) use of inhaled steroids: Secondary | ICD-10-CM

## 2018-06-29 DIAGNOSIS — Z794 Long term (current) use of insulin: Secondary | ICD-10-CM | POA: Diagnosis not present

## 2018-06-29 DIAGNOSIS — Z7401 Bed confinement status: Secondary | ICD-10-CM

## 2018-06-29 DIAGNOSIS — A419 Sepsis, unspecified organism: Secondary | ICD-10-CM | POA: Diagnosis not present

## 2018-06-29 DIAGNOSIS — I2510 Atherosclerotic heart disease of native coronary artery without angina pectoris: Secondary | ICD-10-CM | POA: Diagnosis present

## 2018-06-29 DIAGNOSIS — Z23 Encounter for immunization: Secondary | ICD-10-CM | POA: Diagnosis present

## 2018-06-29 DIAGNOSIS — E1165 Type 2 diabetes mellitus with hyperglycemia: Secondary | ICD-10-CM

## 2018-06-29 DIAGNOSIS — Z66 Do not resuscitate: Secondary | ICD-10-CM | POA: Diagnosis present

## 2018-06-29 DIAGNOSIS — L89153 Pressure ulcer of sacral region, stage 3: Secondary | ICD-10-CM | POA: Diagnosis present

## 2018-06-29 DIAGNOSIS — L89313 Pressure ulcer of right buttock, stage 3: Secondary | ICD-10-CM | POA: Diagnosis present

## 2018-06-29 DIAGNOSIS — Z9981 Dependence on supplemental oxygen: Secondary | ICD-10-CM | POA: Diagnosis not present

## 2018-06-29 DIAGNOSIS — R1030 Lower abdominal pain, unspecified: Secondary | ICD-10-CM | POA: Diagnosis not present

## 2018-06-29 DIAGNOSIS — Z7982 Long term (current) use of aspirin: Secondary | ICD-10-CM | POA: Diagnosis not present

## 2018-06-29 DIAGNOSIS — L89893 Pressure ulcer of other site, stage 3: Secondary | ICD-10-CM | POA: Diagnosis present

## 2018-06-29 DIAGNOSIS — R109 Unspecified abdominal pain: Secondary | ICD-10-CM

## 2018-06-29 DIAGNOSIS — Z7189 Other specified counseling: Secondary | ICD-10-CM

## 2018-06-29 DIAGNOSIS — I059 Rheumatic mitral valve disease, unspecified: Secondary | ICD-10-CM | POA: Diagnosis present

## 2018-06-29 DIAGNOSIS — L89103 Pressure ulcer of unspecified part of back, stage 3: Secondary | ICD-10-CM | POA: Diagnosis present

## 2018-06-29 DIAGNOSIS — N390 Urinary tract infection, site not specified: Secondary | ICD-10-CM | POA: Diagnosis present

## 2018-06-29 DIAGNOSIS — M069 Rheumatoid arthritis, unspecified: Secondary | ICD-10-CM | POA: Diagnosis present

## 2018-06-29 DIAGNOSIS — D638 Anemia in other chronic diseases classified elsewhere: Secondary | ICD-10-CM | POA: Diagnosis present

## 2018-06-29 DIAGNOSIS — Z79899 Other long term (current) drug therapy: Secondary | ICD-10-CM | POA: Diagnosis not present

## 2018-06-29 DIAGNOSIS — E1169 Type 2 diabetes mellitus with other specified complication: Secondary | ICD-10-CM | POA: Diagnosis not present

## 2018-06-29 DIAGNOSIS — E11649 Type 2 diabetes mellitus with hypoglycemia without coma: Secondary | ICD-10-CM | POA: Diagnosis present

## 2018-06-29 DIAGNOSIS — L89323 Pressure ulcer of left buttock, stage 3: Secondary | ICD-10-CM | POA: Diagnosis present

## 2018-06-29 DIAGNOSIS — Z9013 Acquired absence of bilateral breasts and nipples: Secondary | ICD-10-CM

## 2018-06-29 DIAGNOSIS — Z853 Personal history of malignant neoplasm of breast: Secondary | ICD-10-CM | POA: Diagnosis not present

## 2018-06-29 DIAGNOSIS — I10 Essential (primary) hypertension: Secondary | ICD-10-CM | POA: Diagnosis present

## 2018-06-29 DIAGNOSIS — Z888 Allergy status to other drugs, medicaments and biological substances status: Secondary | ICD-10-CM

## 2018-06-29 DIAGNOSIS — E785 Hyperlipidemia, unspecified: Secondary | ICD-10-CM | POA: Diagnosis present

## 2018-06-29 DIAGNOSIS — Z515 Encounter for palliative care: Secondary | ICD-10-CM

## 2018-06-29 DIAGNOSIS — I7300 Raynaud's syndrome without gangrene: Secondary | ICD-10-CM | POA: Diagnosis present

## 2018-06-29 DIAGNOSIS — Z886 Allergy status to analgesic agent status: Secondary | ICD-10-CM | POA: Diagnosis not present

## 2018-06-29 DIAGNOSIS — D649 Anemia, unspecified: Secondary | ICD-10-CM | POA: Diagnosis not present

## 2018-06-29 DIAGNOSIS — M810 Age-related osteoporosis without current pathological fracture: Secondary | ICD-10-CM | POA: Diagnosis present

## 2018-06-29 DIAGNOSIS — L8990 Pressure ulcer of unspecified site, unspecified stage: Secondary | ICD-10-CM | POA: Diagnosis present

## 2018-06-29 DIAGNOSIS — I471 Supraventricular tachycardia: Secondary | ICD-10-CM | POA: Diagnosis not present

## 2018-06-29 DIAGNOSIS — J45909 Unspecified asthma, uncomplicated: Secondary | ICD-10-CM | POA: Diagnosis present

## 2018-06-29 DIAGNOSIS — I959 Hypotension, unspecified: Secondary | ICD-10-CM | POA: Diagnosis not present

## 2018-06-29 LAB — COMPREHENSIVE METABOLIC PANEL
ALBUMIN: 2.1 g/dL — AB (ref 3.5–5.0)
ALT: 10 U/L (ref 0–44)
ALT: 10 U/L (ref 0–44)
ANION GAP: 10 (ref 5–15)
ANION GAP: 8 (ref 5–15)
AST: 16 U/L (ref 15–41)
AST: 13 U/L — ABNORMAL LOW (ref 15–41)
Albumin: 2.6 g/dL — ABNORMAL LOW (ref 3.5–5.0)
Alkaline Phosphatase: 82 U/L (ref 38–126)
Alkaline Phosphatase: 71 U/L (ref 38–126)
BUN: 32 mg/dL — ABNORMAL HIGH (ref 8–23)
BUN: 24 mg/dL — ABNORMAL HIGH (ref 8–23)
CALCIUM: 9.1 mg/dL (ref 8.9–10.3)
CO2: 25 mmol/L (ref 22–32)
CO2: 26 mmol/L (ref 22–32)
Calcium: 8.1 mg/dL — ABNORMAL LOW (ref 8.9–10.3)
Chloride: 97 mmol/L — ABNORMAL LOW (ref 98–111)
Chloride: 104 mmol/L (ref 98–111)
Creatinine, Ser: 1.09 mg/dL — ABNORMAL HIGH (ref 0.44–1.00)
Creatinine, Ser: 0.91 mg/dL (ref 0.44–1.00)
GFR calc Af Amer: >60 mL/min (ref >60)
GFR calc non Af Amer: 43 mL/min — ABNORMAL LOW (ref >60)
GFR calc non Af Amer: 54 mL/min — ABNORMAL LOW (ref >60)
GFR, EST AFRICAN AMERICAN: 50 mL/min — AB (ref >60)
GLUCOSE: 295 mg/dL — AB (ref 70–99)
Glucose, Bld: 365 mg/dL — ABNORMAL HIGH (ref 70–99)
POTASSIUM: 4.3 mmol/L (ref 3.5–5.1)
Potassium: 4.8 mmol/L (ref 3.5–5.1)
SODIUM: 133 mmol/L — AB (ref 135–145)
Sodium: 137 mmol/L (ref 135–145)
TOTAL PROTEIN: 6.1 g/dL — AB (ref 6.5–8.1)
Total Bilirubin: 0.2 mg/dL — ABNORMAL LOW (ref 0.3–1.2)
Total Bilirubin: 0.5 mg/dL (ref 0.3–1.2)
Total Protein: 7 g/dL (ref 6.5–8.1)

## 2018-06-29 LAB — URINALYSIS, ROUTINE W REFLEX MICROSCOPIC
BACTERIA UA: NONE SEEN
BILIRUBIN URINE: NEGATIVE
Glucose, UA: >=500 mg/dL — AB
Ketones, ur: NEGATIVE mg/dL
Nitrite: NEGATIVE
PROTEIN: 100 mg/dL — AB
Specific Gravity, Urine: 1.014 (ref 1.005–1.030)
pH: 5 (ref 5.0–8.0)

## 2018-06-29 LAB — CBC
HCT: 25.5 % — ABNORMAL LOW (ref 36.0–46.0)
Hemoglobin: 7.6 g/dL — ABNORMAL LOW (ref 12.0–15.0)
MCH: 25.9 pg — ABNORMAL LOW (ref 26.0–34.0)
MCHC: 29.8 g/dL — AB (ref 30.0–36.0)
MCV: 87 fL (ref 78.0–100.0)
Platelets: 433 10*3/uL — ABNORMAL HIGH (ref 150–400)
RBC: 2.93 MIL/uL — ABNORMAL LOW (ref 3.87–5.11)
RDW: 15.1 % (ref 11.5–15.5)
WBC: 12 10*3/uL — ABNORMAL HIGH (ref 4.0–10.5)

## 2018-06-29 LAB — HEMOGLOBIN A1C
Hgb A1c MFr Bld: 9.2 % — ABNORMAL HIGH (ref 4.8–5.6)
Mean Plasma Glucose: 217.34 mg/dL

## 2018-06-29 LAB — GLUCOSE, CAPILLARY
GLUCOSE-CAPILLARY: 295 mg/dL — AB (ref 70–99)
GLUCOSE-CAPILLARY: 79 mg/dL (ref 70–99)
GLUCOSE-CAPILLARY: 68 mg/dL — AB (ref 70–99)
Glucose-Capillary: 264 mg/dL — ABNORMAL HIGH (ref 70–99)
Glucose-Capillary: 139 mg/dL — ABNORMAL HIGH (ref 70–99)
Glucose-Capillary: 48 mg/dL — ABNORMAL LOW (ref 70–99)

## 2018-06-29 LAB — CBC WITH DIFFERENTIAL/PLATELET
BASOS PCT: 0 %
Basophils Absolute: 0 10*3/uL (ref 0.0–0.1)
Eosinophils Absolute: 0.1 10*3/uL (ref 0.0–0.7)
Eosinophils Relative: 0 %
HEMATOCRIT: 27 % — AB (ref 36.0–46.0)
HEMOGLOBIN: 8.1 g/dL — AB (ref 12.0–15.0)
Lymphocytes Relative: 13 %
Lymphs Abs: 2.3 10*3/uL (ref 0.7–4.0)
MCH: 26.2 pg (ref 26.0–34.0)
MCHC: 30 g/dL (ref 30.0–36.0)
MCV: 87.4 fL (ref 78.0–100.0)
MONOS PCT: 6 %
Monocytes Absolute: 1 10*3/uL (ref 0.1–1.0)
NEUTROS ABS: 14.2 10*3/uL — AB (ref 1.7–7.7)
NEUTROS PCT: 81 %
Platelets: 482 10*3/uL — ABNORMAL HIGH (ref 150–400)
RBC: 3.09 MIL/uL — ABNORMAL LOW (ref 3.87–5.11)
RDW: 15.3 % (ref 11.5–15.5)
WBC: 17.6 10*3/uL — ABNORMAL HIGH (ref 4.0–10.5)

## 2018-06-29 LAB — I-STAT CG4 LACTIC ACID, ED
LACTIC ACID, VENOUS: 4.02 mmol/L — AB (ref 0.5–1.9)
Lactic Acid, Venous: 3.87 mmol/L (ref 0.5–1.9)

## 2018-06-29 LAB — BETA-HYDROXYBUTYRIC ACID: BETA-HYDROXYBUTYRIC ACID: 0.15 mmol/L (ref 0.05–0.27)

## 2018-06-29 LAB — MRSA PCR SCREENING: MRSA by PCR: NEGATIVE

## 2018-06-29 LAB — CBG MONITORING, ED: GLUCOSE-CAPILLARY: 301 mg/dL — AB (ref 70–99)

## 2018-06-29 MED ORDER — ASPIRIN EC 81 MG PO TBEC
81.0000 mg | DELAYED_RELEASE_TABLET | Freq: Two times a day (BID) | ORAL | Status: DC
  Administered 2018-06-29 – 2018-07-05 (×13): 81 mg via ORAL
  Filled 2018-06-29 (×13): qty 1

## 2018-06-29 MED ORDER — ONDANSETRON HCL 4 MG/2ML IJ SOLN: 4.0000 mg | Freq: Four times a day (QID) | INTRAMUSCULAR | Status: DC | PRN

## 2018-06-29 MED ORDER — METOPROLOL TARTRATE 5 MG/5ML IV SOLN
INTRAVENOUS | Status: AC
  Filled 2018-06-29: qty 5

## 2018-06-29 MED ORDER — INSULIN ASPART 100 UNIT/ML ~~LOC~~ SOLN
4.0000 [IU] | Freq: Three times a day (TID) | SUBCUTANEOUS | Status: DC
  Administered 2018-06-29: 4 [IU] via SUBCUTANEOUS

## 2018-06-29 MED ORDER — VITAMIN D 1000 UNITS PO TABS
2000.0000 [IU] | ORAL_TABLET | Freq: Every day | ORAL | Status: DC
  Administered 2018-06-29 – 2018-07-05 (×7): 2000 [IU] via ORAL
  Filled 2018-06-29 (×7): qty 2

## 2018-06-29 MED ORDER — SODIUM CHLORIDE 0.9 % IV BOLUS (SEPSIS)
1000.0000 mL | Freq: Once | INTRAVENOUS | Status: AC
  Administered 2018-06-29: 1000 mL via INTRAVENOUS

## 2018-06-29 MED ORDER — METOPROLOL TARTRATE 5 MG/5ML IV SOLN
5.0000 mg | Freq: Once | INTRAVENOUS | Status: AC
  Administered 2018-06-29: 5 mg via INTRAVENOUS

## 2018-06-29 MED ORDER — INSULIN ASPART 100 UNIT/ML ~~LOC~~ SOLN
0.0000 [IU] | Freq: Three times a day (TID) | SUBCUTANEOUS | Status: DC
  Administered 2018-06-29: 2 [IU] via SUBCUTANEOUS
  Administered 2018-06-29: 8 [IU] via SUBCUTANEOUS
  Administered 2018-06-30: 3 [IU] via SUBCUTANEOUS
  Administered 2018-06-30: 5 [IU] via SUBCUTANEOUS

## 2018-06-29 MED ORDER — INSULIN ASPART 100 UNIT/ML ~~LOC~~ SOLN: 0.0000 [IU] | Freq: Every day | SUBCUTANEOUS | Status: DC

## 2018-06-29 MED ORDER — INSULIN GLARGINE 100 UNIT/ML ~~LOC~~ SOLN
5.0000 [IU] | Freq: Every day | SUBCUTANEOUS | Status: DC
  Filled 2018-06-29: qty 0.05

## 2018-06-29 MED ORDER — SODIUM CHLORIDE 0.9 % IV SOLN
INTRAVENOUS | Status: DC
  Administered 2018-06-29 (×3): via INTRAVENOUS

## 2018-06-29 MED ORDER — SODIUM CHLORIDE 0.9 % IV BOLUS (SEPSIS)
500.0000 mL | Freq: Once | INTRAVENOUS | Status: AC
  Administered 2018-06-29: 500 mL via INTRAVENOUS

## 2018-06-29 MED ORDER — METOPROLOL SUCCINATE ER 25 MG PO TB24
25.0000 mg | ORAL_TABLET | Freq: Every morning | ORAL | Status: DC
  Administered 2018-06-29: 25 mg via ORAL
  Filled 2018-06-29: qty 1

## 2018-06-29 MED ORDER — POLYVINYL ALCOHOL 1.4 % OP SOLN
1.0000 [drp] | OPHTHALMIC | Status: DC | PRN
  Administered 2018-06-29 – 2018-07-02 (×2): 1 [drp] via OPHTHALMIC
  Filled 2018-06-29: qty 15

## 2018-06-29 MED ORDER — INSULIN ASPART 100 UNIT/ML ~~LOC~~ SOLN
0.0000 [IU] | Freq: Three times a day (TID) | SUBCUTANEOUS | Status: DC
  Administered 2018-06-29: 5 [IU] via SUBCUTANEOUS

## 2018-06-29 MED ORDER — DEXTROSE 50 % IV SOLN
INTRAVENOUS | Status: AC
  Administered 2018-06-30: 50 mL
  Filled 2018-06-29: qty 50

## 2018-06-29 MED ORDER — ENOXAPARIN SODIUM 30 MG/0.3ML ~~LOC~~ SOLN
30.0000 mg | SUBCUTANEOUS | Status: DC
  Administered 2018-06-29 – 2018-06-30 (×2): 30 mg via SUBCUTANEOUS
  Filled 2018-06-29 (×2): qty 0.3

## 2018-06-29 MED ORDER — METRONIDAZOLE IN NACL 5-0.79 MG/ML-% IV SOLN
500.0000 mg | Freq: Three times a day (TID) | INTRAVENOUS | Status: DC
  Administered 2018-06-29 (×2): 500 mg via INTRAVENOUS
  Filled 2018-06-29 (×2): qty 100

## 2018-06-29 MED ORDER — VANCOMYCIN HCL IN DEXTROSE 750-5 MG/150ML-% IV SOLN
750.0000 mg | Freq: Once | INTRAVENOUS | Status: AC
  Administered 2018-06-29: 750 mg via INTRAVENOUS
  Filled 2018-06-29: qty 150

## 2018-06-29 MED ORDER — PREDNISONE 10 MG PO TABS
5.0000 mg | ORAL_TABLET | Freq: Every day | ORAL | Status: DC
  Administered 2018-06-29 – 2018-07-05 (×7): 5 mg via ORAL
  Filled 2018-06-29 (×7): qty 1

## 2018-06-29 MED ORDER — VANCOMYCIN HCL IN DEXTROSE 750-5 MG/150ML-% IV SOLN
INTRAVENOUS | Status: AC
  Filled 2018-06-29: qty 150

## 2018-06-29 MED ORDER — ACETAMINOPHEN 650 MG RE SUPP
650.0000 mg | Freq: Once | RECTAL | Status: AC
  Administered 2018-06-29: 650 mg via RECTAL
  Filled 2018-06-29: qty 1

## 2018-06-29 MED ORDER — SODIUM CHLORIDE 0.9 % IV SOLN
2.0000 g | Freq: Once | INTRAVENOUS | Status: AC
  Administered 2018-06-29: 2 g via INTRAVENOUS
  Filled 2018-06-29: qty 2

## 2018-06-29 MED ORDER — FE FUMARATE-B12-VIT C-FA-IFC PO CAPS
1.0000 | ORAL_CAPSULE | Freq: Every day | ORAL | Status: DC
  Administered 2018-06-29 – 2018-07-05 (×7): 1 via ORAL
  Filled 2018-06-29 (×8): qty 1

## 2018-06-29 MED ORDER — VANCOMYCIN HCL IN DEXTROSE 1-5 GM/200ML-% IV SOLN
1000.0000 mg | Freq: Once | INTRAVENOUS | Status: DC
  Filled 2018-06-29: qty 200

## 2018-06-29 MED ORDER — SODIUM CHLORIDE 0.9 % IV SOLN
1.0000 g | Freq: Three times a day (TID) | INTRAVENOUS | Status: DC
  Administered 2018-06-29: 1 g via INTRAVENOUS
  Filled 2018-06-29 (×5): qty 1

## 2018-06-29 MED ORDER — ATORVASTATIN CALCIUM 10 MG PO TABS
10.0000 mg | ORAL_TABLET | Freq: Every evening | ORAL | Status: DC
  Administered 2018-06-29 – 2018-07-04 (×6): 10 mg via ORAL
  Filled 2018-06-29 (×6): qty 1

## 2018-06-29 MED ORDER — CIPROFLOXACIN IN D5W 400 MG/200ML IV SOLN
400.0000 mg | Freq: Two times a day (BID) | INTRAVENOUS | Status: DC
  Administered 2018-06-29 (×2): 400 mg via INTRAVENOUS
  Filled 2018-06-29 (×2): qty 200

## 2018-06-29 MED ORDER — MOMETASONE FURO-FORMOTEROL FUM 200-5 MCG/ACT IN AERO
2.0000 | INHALATION_SPRAY | Freq: Two times a day (BID) | RESPIRATORY_TRACT | Status: DC
  Administered 2018-06-29 – 2018-07-05 (×9): 2 via RESPIRATORY_TRACT
  Filled 2018-06-29: qty 8.8

## 2018-06-29 MED ORDER — ONDANSETRON HCL 4 MG PO TABS: 4.0000 mg | ORAL_TABLET | Freq: Four times a day (QID) | ORAL | Status: DC | PRN

## 2018-06-29 MED ORDER — PANTOPRAZOLE SODIUM 40 MG PO TBEC
40.0000 mg | DELAYED_RELEASE_TABLET | Freq: Every day | ORAL | Status: DC
  Administered 2018-06-29 – 2018-07-05 (×7): 40 mg via ORAL
  Filled 2018-06-29 (×8): qty 1

## 2018-06-29 MED ORDER — INSULIN GLARGINE 100 UNIT/ML ~~LOC~~ SOLN
8.0000 [IU] | Freq: Every day | SUBCUTANEOUS | Status: DC
  Filled 2018-06-29: qty 0.08

## 2018-06-29 MED ORDER — FLUCONAZOLE 150 MG PO TABS
150.0000 mg | ORAL_TABLET | Freq: Once | ORAL | Status: AC
  Administered 2018-06-29: 150 mg via ORAL
  Filled 2018-06-29: qty 1

## 2018-06-29 MED ORDER — IPRATROPIUM-ALBUTEROL 0.5-2.5 (3) MG/3ML IN SOLN
3.0000 mL | Freq: Two times a day (BID) | RESPIRATORY_TRACT | Status: DC
  Administered 2018-06-29 – 2018-06-30 (×3): 3 mL via RESPIRATORY_TRACT
  Filled 2018-06-29 (×3): qty 3

## 2018-06-29 MED ORDER — VANCOMYCIN HCL IN DEXTROSE 750-5 MG/150ML-% IV SOLN
750.0000 mg | INTRAVENOUS | Status: DC
  Filled 2018-06-29: qty 150

## 2018-06-29 MED ORDER — SODIUM CHLORIDE 0.9 % IV BOLUS
1000.0000 mL | Freq: Once | INTRAVENOUS | Status: AC
  Administered 2018-06-29: 1000 mL via INTRAVENOUS

## 2018-06-29 NOTE — ED Notes (Signed)
CRITICAL VALUE ALERT  Critical Value:  Lactic Acid  Date & Time Notied:  06/29/18 0050  Provider Notified: Dr. Betsey Holiday  Orders Received/Actions taken: see chart

## 2018-06-29 NOTE — Progress Notes (Signed)
While NT was doing vitals, patient had a cbg of 48 and patient heart rate was in the 150s.  Checked patient pulse manually to verify and it was correct.  Ordered state cbg and pushed 1/2 ampule fo d50.  Notified mid-level who then notified MD on floor.  Dr. Darrick Meigs came to floor and assessed patient and administered orders which were carried out. Patient daughters in room during entire time.  Answered patient daughter questions appropriately. Will call md around midnight with patient update.

## 2018-06-29 NOTE — H&P (Signed)
TRH H&P    Patient Demographics:    [name], is a 84 y.o. female  MRN: [id number]  DOB - Apr 11, 1926  Admit Date - 06/29/2018  Referring MD/NP/PA: Dr. Betsey Holiday  Outpatient Primary MD for the patient is Moshe Cipro, MD  Patient coming from: Home  Chief complaint--high blood glucose   HPI:    [name]  is a 84 y.o. female, history of rheumatoid arthritis, breast cancer, CAD, diabetes mellitus type 2, hypertension, hyperlipidemia, who was brought to hospital due to elevated blood glucose at home.  Patient had multiple admissions for the same in the past.  In the ED patient was found to be septic with temperature 100.3, tachycardic, lactic acid elevated at 4.02.  Patient is bedbound, currently on regular diet. Patient started on empiric antibiotics vancomycin, Azactam, Flagyl.  Urine has been ordered.  Chest x-ray shows no infiltrate. No history of nausea vomiting or diarrhea. No shortness of breath.  Not coughing up any phlegm. No previous history of stroke or seizures   Review of systems:    In addition to the HPI above,   All other systems reviewed and are negative.   With Past History of the following :    Past Medical History:  Diagnosis Date  . Acid reflux   . Anemia   . Arthritis    ra  . Asthma   . Cancer [facility])    breast cancer  . Coronary artery disease   . Diabetes mellitus without complication ([place])   . Diverticulitis   . Gastric ulcer   . Hyperlipidemia   . Hypertension   . Hypoxemia   . Kyphosis   . Kyphosis   . Mitral valve disorder   . Osteoporosis   . Oxygen dependent    2 liters at night  . Raynaud disease   . Thrombocytopenia ([place])       Past Surgical History:  Procedure Laterality Date  . BREAST SURGERY    . HERNIA REPAIR    . MASTECTOMY Right 1978  . SIMPLE MASTECTOMY WITH AXILLARY SENTINEL NODE BIOPSY Left 05/09/2015   Procedure: LEFT SIMPLE  MASTECTOMY;  Surgeon: Erroll Luna, MD;  Location: [place] OR;  Service: General;  Laterality: Left;      Social History:      Social History   Tobacco Use  . Smoking status: Never Smoker  . Smokeless tobacco: Never Used  Substance Use Topics  . Alcohol use: No       Family History :     Family History  Problem Relation Age of Onset  . Asthma Sister   . Rheum arthritis Daughter       Home Medications:   Prior to Admission medications   Medication Sig Start Date End Date Taking? Authorizing Provider  aspirin EC 81 MG tablet Take 81 mg by mouth 2 (two) times daily.     [provider]  atorvastatin (LIPITOR) 10 MG tablet Take 10 mg by mouth every evening.     [provider]  benzonatate (TESSALON) 100 MG capsule  Take 1 capsule (100 mg total) by mouth 3 (three) times daily. Patient not taking: Reported on 04/23/2018 03/11/18   Heath Lark D, DO  budesonide-formoterol (SYMBICORT) 160-4.5 MCG/ACT inhaler Inhale 2 puffs into the lungs 2 (two) times daily.    [provider]  Cholecalciferol (VITAMIN D3) 2000 units capsule Take 2,000 Units by mouth daily.     [provider]  D-MANNOSE PO Take 500 mg by mouth daily.     [provider]  diltiazem (CARDIZEM CD) 120 MG 24 hr capsule Take 120 mg by mouth daily.    [provider]  fluconazole (DIFLUCAN) 50 MG tablet Take 50 mg by mouth See admin instructions. Take 1 tablet by mouth every other day starting 06/01/2018 (#3 tabs)    [provider]  guaiFENesin (MUCINEX) 600 MG 12 hr tablet Take 1 tablet (600 mg total) by mouth 2 (two) times daily. Patient taking differently: Take 600 mg by mouth 2 (two) times daily as needed for cough or to loosen phlegm.  03/07/17   Kathie Dike, MD  insulin aspart (NOVOLOG) 100 UNIT/ML FlexPen Inject 4 Units into the skin 3 (three) times daily with meals. 03/07/17   Kathie Dike, MD  Insulin Glargine (LANTUS) 100 UNIT/ML Solostar Pen Inject  5 Units into the skin daily. Patient taking differently: Inject 5 Units into the skin at bedtime.  03/07/17   Kathie Dike, MD  ipratropium-albuterol (DUONEB) 0.5-2.5 (3) MG/3ML SOLN Take 3 mLs by nebulization 2 (two) times daily. 03/07/17   Kathie Dike, MD  lactobacillus acidophilus & bulgar (LACTINEX) chewable tablet CHEW 1 TABLET BY MOUTH EVERY DAY 09/17/15   [provider]  magnesium oxide (MAG-OX) 400 (241.3 Mg) MG tablet Take 0.5 tablets (200 mg total) by mouth daily. Patient taking differently: Take 200-400 mg by mouth daily. Alternating 200 mg one day then 400 mg the next. 04/15/16   Reyne Dumas, MD  metFORMIN (GLUCOPHAGE) 500 MG tablet Take 1,000 mg by mouth 2 (two) times daily with a meal.    [provider]  metoprolol succinate (TOPROL-XL) 25 MG 24 hr tablet Take 0.5 tablets (12.5 mg total) by mouth daily. Patient taking differently: Take 25 mg by mouth every morning.  01/11/18   Johnson, Clanford L, MD  mirtazapine (REMERON) 15 MG tablet Take 0.5 tablets (7.5 mg total) by mouth at bedtime. Patient not taking: Reported on 03/25/2018 02/12/18   Murlean Iba, MD  Multiple Vitamins-Minerals (CENTRUM SILVER ADULT 50+ PO) Take 1 tablet by mouth every morning.     [provider]  pantoprazole (PROTONIX) 40 MG tablet Take 40 mg by mouth daily.    [provider]  POLY-IRON 150 FORTE 150-25-1 MG-MCG-MG CAPS TAKE ONE CAPSULE BY MOUTH DAILY 04/07/17   Baird Cancer, PA-C  Polyvinyl Alcohol-Povidone (CLEAR EYES ALL SEASONS) 5-6 MG/ML SOLN Apply 1 drop to eye every morning.     [provider]  predniSONE (DELTASONE) 5 MG tablet Take 1 tablet (5 mg total) by mouth daily with breakfast. 02/12/18   Wynetta Emery, Clanford L, MD  raloxifene (EVISTA) 60 MG tablet Take 60 mg by mouth daily.    [provider]  traMADol (ULTRAM) 50 MG tablet Take 50 mg by mouth every 6 (six) hours as needed for moderate pain.     [provider]  zinc  gluconate 50 MG tablet Take 50 mg by mouth daily.    [provider]     Allergies:     Allergies  Allergen Reactions  . Aspirin Other (See Comments)    Cannot take uncoated due to stomach ulcers  . Megace [Megestrol]     abd pain   . Rocephin [Ceftriaxone Sodium In Dextrose] Swelling     Physical Exam:   Vitals  Blood pressure (!) 123/53, pulse (!) 106, temperature 99.6 F (37.6 C), temperature source Rectal, resp. rate 16, weight 46.2 kg, SpO2 97 %.  1.  General: Appears in no acute distress 2. Psychiatric:  Intact judgement and  insight, awake alert, oriented x 3.  3. Neurologic: No focal neurological deficits, all cranial nerves intact.Strength 5/5 all 4 extremities, sensation intact all 4 extremities, plantars down going.  4. Eyes :  anicteric sclerae, moist conjunctivae with no lid lag. PERRLA.  5. ENMT:  Oropharynx clear with moist mucous membranes and good dentition  6. Neck:  supple, no cervical lymphadenopathy appriciated, No thyromegaly  7. Respiratory : Normal respiratory effort, good air movement bilaterally,clear to  auscultation bilaterally  8. Cardiovascular : RRR, no gallops, rubs or murmurs, no leg edema  9. Gastrointestinal:  Positive bowel sounds, abdomen soft, non-tender to palpation,no hepatosplenomegaly, no rigidity or guarding       10. Skin:  No cyanosis, normal texture and turgor, no rash, lesions or ulcers  11.Musculoskeletal:  Good muscle tone,  joints appear normal , no effusions,  normal range of motion    Data Review:    CBC Recent Labs  Lab 06/29/18 0030  WBC 17.6*  HGB 8.1*  HCT 27.0*  PLT 482*  MCV 87.4  MCH 26.2  MCHC 30.0  RDW 15.3  LYMPHSABS 2.3  MONOABS 1.0  EOSABS 0.1  BASOSABS 0.0   ------------------------------------------------------------------------------------------------------------------  Results for orders placed or performed during the hospital encounter of 06/29/18 (from the past  48 hour(s))  CBG monitoring, ED     Status: Abnormal   Collection Time: 06/29/18 12:15 AM  Result Value Ref Range   Glucose-Capillary 301 (H) 70 - 99 mg/dL  Comprehensive metabolic panel     Status: Abnormal   Collection Time: 06/29/18 12:30 AM  Result Value Ref Range   Sodium 133 (L) 135 - 145 mmol/L   Potassium 4.8 3.5 - 5.1 mmol/L   Chloride 97 (L) 98 - 111 mmol/L   CO2 26 22 - 32 mmol/L   Glucose, Bld 365 (H) 70 - 99 mg/dL   BUN 32 (H) 8 - 23 mg/dL   Creatinine, Ser 1.09 (H) 0.44 - 1.00 mg/dL   Calcium 9.1 8.9 - 10.3 mg/dL   Total Protein 7.0 6.5 - 8.1 g/dL   Albumin 2.6 (L) 3.5 - 5.0 g/dL   AST 16 15 - 41 U/L   ALT 10 0 - 44 U/L   Alkaline Phosphatase 82 38 - 126 U/L   Total Bilirubin 0.2 (L) 0.3 - 1.2 mg/dL   GFR calc non Af Amer 43 (L) >60 mL/min   GFR calc Af Amer 50 (L) >60 mL/min    Comment: (NOTE) The eGFR has been calculated using the CKD EPI equation. This calculation has not been validated in all clinical situations. eGFR's persistently <60 mL/min signify possible Chronic Kidney Disease.    Anion gap 10 5 - 15    Comment: Performed at [facility], [address], [place], [place] [address]  CBC WITH DIFFERENTIAL     Status: Abnormal   Collection Time: 06/29/18 12:30 AM  Result Value Ref Range   WBC 17.6 (H) 4.0 - 10.5 K/uL   RBC  3.09 (L) 3.87 - 5.11 MIL/uL   Hemoglobin 8.1 (L) 12.0 - 15.0 g/dL   HCT 27.0 (L) 36.0 - 46.0 %   MCV 87.4 78.0 - 100.0 fL   MCH 26.2 26.0 - 34.0 pg   MCHC 30.0 30.0 - 36.0 g/dL   RDW 15.3 11.5 - 15.5 %   Platelets 482 (H) 150 - 400 K/uL   Neutrophils Relative % 81 %   Neutro Abs 14.2 (H) 1.7 - 7.7 K/uL   Lymphocytes Relative 13 %   Lymphs Abs 2.3 0.7 - 4.0 K/uL   Monocytes Relative 6 %   Monocytes Absolute 1.0 0.1 - 1.0 K/uL   Eosinophils Relative 0 %   Eosinophils Absolute 0.1 0.0 - 0.7 K/uL   Basophils Relative 0 %   Basophils Absolute 0.0 0.0 - 0.1 K/uL    Comment: Performed at [facility], [address],  [place], [place] [address]  Beta-hydroxybutyric acid     Status: None   Collection Time: 06/29/18 12:30 AM  Result Value Ref Range   Beta-Hydroxybutyric Acid 0.15 0.05 - 0.27 mmol/L    Comment: Performed at [facility], [address], [place], [place] [address]  I-Stat CG4 Lactic Acid, ED  (not at  [facility])     Status: Abnormal   Collection Time: 06/29/18 12:40 AM  Result Value Ref Range   Lactic Acid, Venous 3.87 (HH) 0.5 - 1.9 mmol/L  I-Stat CG4 Lactic Acid, ED  (not at  [facility])     Status: Abnormal   Collection Time: 06/29/18  3:25 AM  Result Value Ref Range   Lactic Acid, Venous 4.02 (HH) 0.5 - 1.9 mmol/L   Comment NOTIFIED PHYSICIAN   Urinalysis, Routine w reflex microscopic     Status: Abnormal   Collection Time: 06/29/18  3:30 AM  Result Value Ref Range   Color, Urine YELLOW YELLOW   APPearance CLOUDY (A) CLEAR   Specific Gravity, Urine 1.014 1.005 - 1.030   pH 5.0 5.0 - 8.0   Glucose, UA >=500 (A) NEGATIVE mg/dL   Hgb urine dipstick SMALL (A) NEGATIVE   Bilirubin Urine NEGATIVE NEGATIVE   Ketones, ur NEGATIVE NEGATIVE mg/dL   Protein, ur 100 (A) NEGATIVE mg/dL   Nitrite NEGATIVE NEGATIVE   Leukocytes, UA LARGE (A) NEGATIVE   RBC / HPF 21-50 0 - 5 RBC/hpf   WBC, UA >50 (H) 0 - 5 WBC/hpf   Bacteria, UA NONE SEEN NONE SEEN   Squamous Epithelial / LPF 0-5 0 - 5   WBC Clumps PRESENT    Budding Yeast PRESENT    Non Squamous Epithelial 0-5 (A) NONE SEEN    Comment: Performed at [facility], [address] [place], [place] [address]    Chemistries  Recent Labs  Lab 06/29/18 0030  NA 133*  K 4.8  CL 97*  CO2 26  GLUCOSE 365*  BUN 32*  CREATININE 1.09*  CALCIUM 9.1  AST 16  ALT 10  ALKPHOS 82  BILITOT 0.2*   ------------------------------------------------------------------------------------------------------------------  ------------------------------------------------------------------------------------------------------------------ GFR: Estimated Creatinine  Clearance: 25 mL/min (A) (by C-G formula based on SCr of 1.09 mg/dL (H)). Liver Function Tests: Recent Labs  Lab 06/29/18 0030  AST 16  ALT 10  ALKPHOS 82  BILITOT 0.2*  PROT 7.0  ALBUMIN 2.6*   No results for input(s): LIPASE, AMYLASE in the last 168 hours. No results for input(s): AMMONIA in the last 168 hours. Coagulation Profile: No results for input(s): INR, PROTIME in the last 168 hours. Cardiac  Enzymes: No results for input(s): CKTOTAL, CKMB, CKMBINDEX, TROPONINI in the last 168 hours. BNP (last 3 results) No results for input(s): PROBNP in the last 8760 hours. HbA1C: No results for input(s): HGBA1C in the last 72 hours. CBG: Recent Labs  Lab 06/29/18 0015  GLUCAP 301*   Lipid Profile: No results for input(s): CHOL, HDL, LDLCALC, TRIG, CHOLHDL, LDLDIRECT in the last 72 hours. Thyroid Function Tests: No results for input(s): TSH, T4TOTAL, FREET4, T3FREE, THYROIDAB in the last 72 hours. Anemia Panel: No results for input(s): VITAMINB12, FOLATE, FERRITIN, TIBC, IRON, RETICCTPCT in the last 72 hours.  --------------------------------------------------------------------------------------------------------------- Urine analysis:    Component Value Date/Time   COLORURINE YELLOW 06/29/2018 0330   APPEARANCEUR CLOUDY (A) 06/29/2018 0330   LABSPEC 1.014 06/29/2018 0330   PHURINE 5.0 06/29/2018 0330   GLUCOSEU >=500 (A) 06/29/2018 0330   HGBUR SMALL (A) 06/29/2018 0330   BILIRUBINUR NEGATIVE 06/29/2018 0330   KETONESUR NEGATIVE 06/29/2018 0330   PROTEINUR 100 (A) 06/29/2018 0330   UROBILINOGEN 0.2 06/30/2015 1317   NITRITE NEGATIVE 06/29/2018 0330   LEUKOCYTESUR LARGE (A) 06/29/2018 0330      Imaging Results:    Dg Chest Port 1 View  Result Date: 06/29/2018 CLINICAL DATA:  Sepsis EXAM: PORTABLE CHEST 1 VIEW COMPARISON:  06/07/2018 chest radiograph. FINDINGS: Left rotated chest radiograph. Left thorax obscured by the patient's jaw. Stable cardiomediastinal  silhouette with normal heart size. No pneumothorax. No pleural effusion. No pulmonary edema. Stable elevation of the left hemidiaphragm with mild bibasilar atelectasis. IMPRESSION: Limited rotated chest radiograph. Chronic elevation of the left hemidiaphragm with mild bibasilar scarring versus atelectasis. Electronically Signed   By: Ilona Sorrel M.D.   On: 06/29/2018 01:42    My personal review of EKG: Rhythm NSR   Assessment & Plan:    Active Problems:   Sepsis ([place])   1. Sepsis-source of infection is likely urine.  Follow urine culture results.  Patient empirically started on vancomycin, Azactam, Flagyl.  Blood culture x2 have been obtained.  2. Decubitus ulcers-patient has stage II decubitus ulcer on sacrum will obtain wound care consultation in a.m.  3. Diabetes mellitus type 2 uncontrolled-continue Lantus 5 units subcu daily.  Start sliding scale insulin with NovoLog.  4. Hypertension-we will hold Cardizem, continue metoprolol.   DVT Prophylaxis-   Lovenox   AM Labs Ordered, also please review Full Orders  Family Communication: Admission, patients condition and plan of care including tests being ordered have been discussed with the patients daughters at bedside* who indicate understanding and agree with the plan and Code Status.  Code Status: Full code  Admission status: Inpatient  Time spent in minutes : 60 minutes   Oswald Hillock M.D on 06/29/2018 at 3:50 AM  Between 7am to 7pm - Pager - [phone]. After 7pm go to [email or web address] - password [facility]  [facility] - Office  [phone]

## 2018-06-29 NOTE — ED Triage Notes (Signed)
Pts family member states the when they had taken pts BS earlier today it read as high. Pts family states pt has been "more sleepy and not herself today."

## 2018-06-29 NOTE — Consult Note (Addendum)
[place] Nurse wound consult note Reason for Consult: Consult requested for back and buttocks.  Pt is familiar to [facility] team from previus admissions, recently in June.  Consult performed using remote camera with assistance from the bedside nurse for measurements and assessment.  Family members are at the bedside and answered questions.  Pt has been followed at the outpatient wound care center in [place] prior to admission, and they have been using Aquacel to buttocks and a foam dressing to the upper back. Wound type: Upper back/posterior shoulder with healing stage 3 pressure injuries in 2 locations; .5X.5X.1cm and 1X1X.1cm, pink dry scar tissue with shallow open wounds, no odor, drainage, or fluctuance. Bilat buttocks/ sacrum with stage 3 chronic pressure injury interspersed with moisture associated skin damage. (MASD)  Daughter at the bedside states all wounds have greatly improved. Affected area of MASD is 9X7cm, with stage 3 pressure injuy to the inner buttocks/sacrum 4.5X4X.2cm, red and moist, mod amt yellow drainage, no odor or fluctuance. Pressure Injury POA: Yes Dressing procedure/placement/frequency: Pt is on a low airloss bed to reduce pressure.  Continue present plan of care with foam dressing to back/posterior shoulder to protect from further injury and Aquacel to absorb drainage and provide antimicrobial benefits to sacrum/buttocks.  Discussed plan of care with family members in the room and topical treatment orders provided for bedside nurses to perform. Pt can resume follow-up at the outpatient wound care center after discharge. Please re-consult if further assistance is needed.  Thank-you,  Julien Girt MSN, [place], [place], [place], [place]

## 2018-06-29 NOTE — Progress Notes (Signed)
Pharmacy Antibiotic Note  [name] is a 84 y.o. female admitted on 06/29/2018 with unknown source.  Pharmacy has been consulted for Vancomycin and aztreonam dosing.  Plan: Vancomycin 750mg  IV every 24 hours.  Goal trough 15-20 mcg/mL.  Aztreonam 2gm IV loading dose, then 1gm IV q8h Also on flagyl 500mg  IV q8h F/U cxs and clinical progress Monitor V/S, labs and levels as indicated  Height: 5\' 3"  (160 cm) Weight: 105 lb 6.1 oz (47.8 kg) IBW/kg (Calculated) : 52.4  Temp (24hrs), Avg:99.1 F (37.3 C), Min:98.1 F (36.7 C), Max:100.7 F (38.2 C)  Recent Labs  Lab 06/29/18 0030 06/29/18 0040 06/29/18 0325 06/29/18 0614  WBC 17.6*  --   --  12.0*  CREATININE 1.09*  --   --  0.91  LATICACIDVEN  --  3.87* 4.02*  --     Estimated Creatinine Clearance: 31 mL/min (by C-G formula based on SCr of 0.91 mg/dL).    Allergies  Allergen Reactions  . Aspirin Other (See Comments)    Cannot take uncoated due to stomach ulcers  . Megace [Megestrol]     abd pain   . Rocephin [Ceftriaxone Sodium In Dextrose] Swelling    Antimicrobials this admission: Vancomycin 9/24>> Aztreonam  9/24>>  Metronidazole 9/24>>  Dose adjustments this admission: N/A  Microbiology results: 9/24 BCx: ngtd 9/24 UCx: pending  9/24 MRSA PCR: Neg  Thank you for allowing pharmacy to be a part of this patient's care.  Isac Sarna, BS Pharm D, BCPS Clinical Pharmacist Pager [phone] 06/29/2018 8:10 AM

## 2018-06-29 NOTE — ED Notes (Signed)
Critical Lab Result  Name: Lactic Acid  Value: 4.02 mmol/L  Date/Time: 06/29/2018 0330  MD Notified: Dr. Betsey Holiday, MD

## 2018-06-29 NOTE — ED Notes (Signed)
Date and time results received: 06/29/18 00:57   Test: lactic acid 3.87 Critical Value: lactic acid 3.87  Name of Provider Notified: Dr Betsey Holiday  Orders Received? Or Actions Taken?: no additional orders given,

## 2018-06-29 NOTE — Progress Notes (Addendum)
Called by nurse practitioner the patient on the floor his SVT.  Patient seen and examined, admitted earlier with possible sepsis due to UTI.  Antibiotics changed to ciprofloxacin.  Dose of Lantus adjusted and started on meal coverage.  Patient developed hypoglycemia with blood glucose 48 and went into SVT.  Patient given  D50 ampule and blood glucose improved to 79. Will give 1 dose of Lopressor 5 mg IV and monitor.  Patient is developing persistent hypoglycemia due to recent change of insulin.  Start D5 half-normal saline at 75 mill per hour check CBG every 2 hours.  SVT has developed likely due to hypoglycemia. If no improvement with IV metoprolol, consider Cardizem  Family does not want to use Adenosine. Discussed with daughters at bedside  Will monitor on telemetry  Critical care time spent 40 minutes

## 2018-06-29 NOTE — ED Provider Notes (Signed)
[facility] Provider Note   CSN: [id number] Arrival date & time: 06/29/18  0007     History   Chief Complaint Chief Complaint  Patient presents with  . Weakness    HPI [name] is a 84 y.o. female.  Patient brought to the emergency department by daughters for evaluation of generalized weakness and elevated blood sugar.  Daughter reports that the blood sugar has been running 400s today.  She did have one episode of emesis earlier today, no vomiting since.  She has not complained of abdominal pain.  There has not been any diarrhea.  Daughter is unaware of any fevers.  Daughter reports that she has a chronic wound on her sacrum, but now has a new one on her back.     Past Medical History:  Diagnosis Date  . Acid reflux   . Anemia   . Arthritis    ra  . Asthma   . Cancer [facility])    breast cancer  . Coronary artery disease   . Diabetes mellitus without complication ([place])   . Diverticulitis   . Gastric ulcer   . Hyperlipidemia   . Hypertension   . Hypoxemia   . Kyphosis   . Kyphosis   . Mitral valve disorder   . Osteoporosis   . Oxygen dependent    2 liters at night  . Raynaud disease   . Thrombocytopenia [facility])     Patient Active Problem List   Diagnosis Date Noted  . SVT (supraventricular tachycardia) ([place]) 01/06/2018  . Sepsis secondary to UTI ([place]) 01/05/2018  . Hypernatremia 01/05/2018  . Decubitus ulcer of thoracic spine area, stage III ([place]) 01/05/2018  . Decubitus ulcer of sacral region, stage 2 01/05/2018  . Anemia 01/05/2018  . HCAP (healthcare-associated pneumonia) 03/06/2017  . Hyperglycemia 03/03/2017  . Electrolyte abnormality 03/03/2017  . Pressure injury of skin 01/16/2017  . Dehydration   . Lactic acidosis 07/19/2016  . Asthma, chronic 04/12/2016  . Sepsis ([place]) 03/08/2016  . CAP (community acquired pneumonia) 10/09/2015  . Pressure ulcer 10/09/2015  . Kyphosis   . UTI (urinary tract infection) 06/30/2015  .  Breast cancer of upper-outer quadrant of left female breast ([place]) 05/09/2015  . Rheumatoid arthritis ([place]) 10/29/2012  . HTN (hypertension) 10/29/2012  . Diabetes type 2, controlled ([place]) 10/29/2012  . GERD (gastroesophageal reflux disease) 10/29/2012    Past Surgical History:  Procedure Laterality Date  . BREAST SURGERY    . HERNIA REPAIR    . MASTECTOMY Right 1978  . SIMPLE MASTECTOMY WITH AXILLARY SENTINEL NODE BIOPSY Left 05/09/2015   Procedure: LEFT SIMPLE MASTECTOMY;  Surgeon: Erroll Luna, MD;  Location: [facility];  Service: General;  Laterality: Left;     OB History    Gravida  4   Para  4   Term  4   Preterm      AB      Living  4     SAB      TAB      Ectopic      Multiple      Live Births               Home Medications    Prior to Admission medications   Medication Sig Start Date End Date Taking? Authorizing Provider  aspirin EC 81 MG tablet Take 81 mg by mouth 2 (two) times daily.     [provider]  atorvastatin (LIPITOR) 10 MG tablet Take 10 mg by  mouth every evening.     [provider]  benzonatate (TESSALON) 100 MG capsule Take 1 capsule (100 mg total) by mouth 3 (three) times daily. Patient not taking: Reported on 04/23/2018 03/11/18   Heath Lark D, DO  budesonide-formoterol (SYMBICORT) 160-4.5 MCG/ACT inhaler Inhale 2 puffs into the lungs 2 (two) times daily.    [provider]  Cholecalciferol (VITAMIN D3) 2000 units capsule Take 2,000 Units by mouth daily.     [provider]  D-MANNOSE PO Take 500 mg by mouth daily.     [provider]  diltiazem (CARDIZEM CD) 120 MG 24 hr capsule Take 120 mg by mouth daily.    [provider]  fluconazole (DIFLUCAN) 50 MG tablet Take 50 mg by mouth See admin instructions. Take 1 tablet by mouth every other day starting 06/01/2018 (#3 tabs)    [provider]  guaiFENesin (MUCINEX) 600 MG 12 hr tablet Take 1 tablet (600 mg total) by mouth 2 (two)  times daily. Patient taking differently: Take 600 mg by mouth 2 (two) times daily as needed for cough or to loosen phlegm.  03/07/17   Kathie Dike, MD  insulin aspart (NOVOLOG) 100 UNIT/ML FlexPen Inject 4 Units into the skin 3 (three) times daily with meals. 03/07/17   Kathie Dike, MD  Insulin Glargine (LANTUS) 100 UNIT/ML Solostar Pen Inject 5 Units into the skin daily. Patient taking differently: Inject 5 Units into the skin at bedtime.  03/07/17   Kathie Dike, MD  ipratropium-albuterol (DUONEB) 0.5-2.5 (3) MG/3ML SOLN Take 3 mLs by nebulization 2 (two) times daily. 03/07/17   Kathie Dike, MD  lactobacillus acidophilus & bulgar (LACTINEX) chewable tablet CHEW 1 TABLET BY MOUTH EVERY DAY 09/17/15   [provider]  magnesium oxide (MAG-OX) 400 (241.3 Mg) MG tablet Take 0.5 tablets (200 mg total) by mouth daily. Patient taking differently: Take 200-400 mg by mouth daily. Alternating 200 mg one day then 400 mg the next. 04/15/16   Reyne Dumas, MD  metFORMIN (GLUCOPHAGE) 500 MG tablet Take 1,000 mg by mouth 2 (two) times daily with a meal.    [provider]  metoprolol succinate (TOPROL-XL) 25 MG 24 hr tablet Take 0.5 tablets (12.5 mg total) by mouth daily. Patient taking differently: Take 25 mg by mouth every morning.  01/11/18   Johnson, Clanford L, MD  mirtazapine (REMERON) 15 MG tablet Take 0.5 tablets (7.5 mg total) by mouth at bedtime. Patient not taking: Reported on 03/25/2018 02/12/18   Murlean Iba, MD  Multiple Vitamins-Minerals (CENTRUM SILVER ADULT 50+ PO) Take 1 tablet by mouth every morning.     [provider]  pantoprazole (PROTONIX) 40 MG tablet Take 40 mg by mouth daily.    [provider]  POLY-IRON 150 FORTE 150-25-1 MG-MCG-MG CAPS TAKE ONE CAPSULE BY MOUTH DAILY 04/07/17   Baird Cancer, PA-C  Polyvinyl Alcohol-Povidone (CLEAR EYES ALL SEASONS) 5-6 MG/ML SOLN Apply 1 drop to eye every morning.     [provider]    predniSONE (DELTASONE) 5 MG tablet Take 1 tablet (5 mg total) by mouth daily with breakfast. 02/12/18   Wynetta Emery, Clanford L, MD  raloxifene (EVISTA) 60 MG tablet Take 60 mg by mouth daily.    [provider]  traMADol (ULTRAM) 50 MG tablet Take 50 mg by mouth every 6 (six) hours as needed for moderate pain.     [provider]  zinc gluconate 50 MG tablet Take 50 mg by mouth daily.  [provider]    Family History Family History  Problem Relation Age of Onset  . Asthma Sister   . Rheum arthritis Daughter     Social History Social History   Tobacco Use  . Smoking status: Never Smoker  . Smokeless tobacco: Never Used  Substance Use Topics  . Alcohol use: No  . Drug use: No     Allergies   Aspirin; Megace [megestrol]; and Rocephin [ceftriaxone sodium in dextrose]   Review of Systems Review of Systems  Constitutional: Positive for fatigue.  Gastrointestinal: Positive for vomiting.  Skin: Positive for wound.  All other systems reviewed and are negative.    Physical Exam Updated Vital Signs BP (!) 117/48   Pulse (!) 108   Temp (!) 100.7 F (38.2 C) (Rectal)   Resp 18   Wt 46.2 kg   SpO2 95%   BMI 18.03 kg/m   Physical Exam  Constitutional: No distress.  HENT:  Head: Normocephalic and atraumatic.  Right Ear: Hearing normal.  Left Ear: Hearing normal.  Nose: Nose normal.  Mouth/Throat: Oropharynx is clear and moist and mucous membranes are normal.  Eyes: Pupils are equal, round, and reactive to light. Conjunctivae and EOM are normal.  Neck: Normal range of motion. Neck supple.  Cardiovascular: Regular rhythm, S1 normal and S2 normal. Exam reveals no gallop and no friction rub.  No murmur heard. Pulmonary/Chest: Effort normal and breath sounds normal. No respiratory distress. She exhibits no tenderness.  Abdominal: Soft. Normal appearance and bowel sounds are normal. There is no hepatosplenomegaly. There is no tenderness. There is  no rebound, no guarding, no tenderness at McBurney's point and negative Murphy's sign. No hernia.  Musculoskeletal: Normal range of motion.  Neurological: She is alert. She has normal strength. No cranial nerve deficit or sensory deficit. Coordination normal. GCS eye subscore is 4. GCS verbal subscore is 4. GCS motor subscore is 6.  Skin: Skin is warm and dry. No rash noted. No cyanosis.  Large Sacral decubitus with pink granulation tissue present, no drainage  Decubitus on right scapular region with surrounding erythema, warmth, no fluctuance  Psychiatric: She has a normal mood and affect. Her speech is normal and behavior is normal. Thought content normal.  Nursing note and vitals reviewed.    ED Treatments / Results  Labs (all labs ordered are listed, but only abnormal results are displayed) Labs Reviewed  COMPREHENSIVE METABOLIC PANEL - Abnormal; Notable for the following components:      Result Value   Sodium 133 (*)    Chloride 97 (*)    Glucose, Bld 365 (*)    BUN 32 (*)    Creatinine, Ser 1.09 (*)    Albumin 2.6 (*)    Total Bilirubin 0.2 (*)    GFR calc non Af Amer 43 (*)    GFR calc Af Amer 50 (*)    All other components within normal limits  CBC WITH DIFFERENTIAL/PLATELET - Abnormal; Notable for the following components:   WBC 17.6 (*)    RBC 3.09 (*)    Hemoglobin 8.1 (*)    HCT 27.0 (*)    Platelets 482 (*)    Neutro Abs 14.2 (*)    All other components within normal limits  CBG MONITORING, ED - Abnormal; Notable for the following components:   Glucose-Capillary 301 (*)    All other components within normal limits  I-STAT CG4 LACTIC ACID, ED - Abnormal; Notable for the following components:   Lactic Acid, Venous  3.87 (*)    All other components within normal limits  CULTURE, BLOOD (ROUTINE X 2)  CULTURE, BLOOD (ROUTINE X 2)  BETA-HYDROXYBUTYRIC ACID  URINALYSIS, ROUTINE W REFLEX MICROSCOPIC  I-STAT CG4 LACTIC ACID, ED    EKG EKG  Interpretation  Date/Time:  Tuesday June 29 2018 00:24:27 EDT Ventricular Rate:  113 PR Interval:    QRS Duration: 86 QT Interval:  333 QTC Calculation: 457 R Axis:   -11 Text Interpretation:  Sinus tachycardia Abnormal R-wave progression, early transition Left ventricular hypertrophy ST elevation, consider anterior injury No significant change since last tracing Confirmed by Orpah Greek ([id number]) on 06/29/2018 12:45:22 AM   Radiology Dg Chest Port 1 View  Result Date: 06/29/2018 CLINICAL DATA:  Sepsis EXAM: PORTABLE CHEST 1 VIEW COMPARISON:  06/07/2018 chest radiograph. FINDINGS: Left rotated chest radiograph. Left thorax obscured by the patient's jaw. Stable cardiomediastinal silhouette with normal heart size. No pneumothorax. No pleural effusion. No pulmonary edema. Stable elevation of the left hemidiaphragm with mild bibasilar atelectasis. IMPRESSION: Limited rotated chest radiograph. Chronic elevation of the left hemidiaphragm with mild bibasilar scarring versus atelectasis. Electronically Signed   By: Ilona Sorrel M.D.   On: 06/29/2018 01:42    Procedures Procedures (including critical care time)  Medications Ordered in ED Medications  metroNIDAZOLE (FLAGYL) IVPB 500 mg ( Intravenous Stopped 06/29/18 0204)  vancomycin (VANCOCIN) IVPB 750 mg/150 ml premix ( Intravenous Rate/Dose Verify 06/29/18 0216)  aztreonam (AZACTAM) 2 g in sodium chloride 0.9 % 100 mL IVPB ( Intravenous Stopped 06/29/18 0133)  sodium chloride 0.9 % bolus 1,000 mL (1,000 mLs Intravenous New Bag/Given 06/29/18 0057)    And  sodium chloride 0.9 % bolus 500 mL (500 mLs Intravenous New Bag/Given 06/29/18 0103)  acetaminophen (TYLENOL) suppository 650 mg (650 mg Rectal Given 06/29/18 0145)     Initial Impression / Assessment and Plan / ED Course  I have reviewed the triage vital signs and the nursing notes.  Pertinent labs & imaging results that were available during my care of the patient were reviewed  by me and considered in my medical decision making (see chart for details).     Patient brought to the emergency department by family for evaluation of generalized weakness.  She has been running high blood sugars all day today.  Patient has a history of recurrent urinary tract infection and sepsis.  At arrival rectal temperature was elevated.  She was tachycardic and therefore initiated on sepsis protocol.  Patient given broad-spectrum antibiotics and IV fluids.  Heart rate is improving.  She has not had any hypotension.  Lactic acid, however, is markedly elevated.  Source of fever is unclear at this time.  She does have what appears to be early cellulitis on her back from her pressure ulcer.  She also has recurrent urinary tract infections in the past.  In and out cath was attempted at arrival, but no urine was present.  Patient was given an IV fluid bolus and then cath was reattempted.  During the catheterization attempt, patient urinated on the bed and no urine was collected.  A pure wick will be placed to collect urine, patient has been empirically covered for skin as well as urine.  Final Clinical Impressions(s) / ED Diagnoses   Final diagnoses:  Sepsis, due to unspecified organism [facility])    CRITICAL CARE Performed by: Orpah Greek.   Total critical care time: 35 minutes  Critical care time was exclusive of separately billable procedures and treating other patients.  Critical care was necessary to treat or prevent imminent or life-threatening deterioration.  Critical care was time spent personally by me on the following activities: development of treatment plan with patient and/or surrogate as well as nursing, discussions with consultants, evaluation of patient's response to treatment, examination of patient, obtaining history from patient or surrogate, ordering and performing treatments and interventions, ordering and review of laboratory studies, ordering and review of  radiographic studies, pulse oximetry and re-evaluation of patient's condition.   ED Discharge Orders    None       Coraima Tibbs, Gwenyth Allegra, MD 06/29/18 [phone]

## 2018-06-29 NOTE — ED Notes (Signed)
Pt hr increased to 148-150, bp 120/53, Dr Betsey Holiday notified, additional orders given, upon returning to pt's room, hr decreased to 107. Dr Betsey Holiday notified, family states that pt has high hr at times and then will come back down,

## 2018-06-29 NOTE — ED Notes (Signed)
Attempted to cath pt without success, only able to obtain a few drops of cloudy urine, Dr Betsey Holiday notified,

## 2018-06-29 NOTE — Progress Notes (Signed)
Patient seen and examined, database reviewed.  Discussed with 2 daughters at bedside.  Patient was admitted at around 4:00 this morning with high blood sugar and increased lethargy.  She was found to have SIRS criteria; thought to have sepsis due to UTI.  In further reviewing data today, I do not believe she has sepsis, I believe she met SIRS criteria due to hyperglycemia and osmotic diuresis causing decreased tissue perfusion and hence lactic acidosis.  She may have a UTI based on UA.  Do not believe she needs continued broad-spectrum antibiotic therapy.  Will discontinue vancomycin, aztreonam and Flagyl, will place on Cipro pending culture data.  If cultures are negative would elect to discontinue antibiotics altogether.  She does have a vaginal yeast infection and I will order Diflucan x1.  We will also adjust her Lantus from 5 to 8 units and increase her SSI coverage to moderate given persistent hypoglycemia.  Agree with continuing IV fluids today.  Believe patient is stable to transfer to floor.  Will continue to follow.  Domingo Mend, MD [facility] Pager: [phone]

## 2018-06-30 DIAGNOSIS — Z794 Long term (current) use of insulin: Secondary | ICD-10-CM

## 2018-06-30 DIAGNOSIS — I959 Hypotension, unspecified: Secondary | ICD-10-CM

## 2018-06-30 DIAGNOSIS — E1169 Type 2 diabetes mellitus with other specified complication: Secondary | ICD-10-CM

## 2018-06-30 LAB — CBC
HCT: 23.5 % — ABNORMAL LOW (ref 36.0–46.0)
Hemoglobin: 7.1 g/dL — ABNORMAL LOW (ref 12.0–15.0)
MCH: 26.2 pg (ref 26.0–34.0)
MCHC: 30.2 g/dL (ref 30.0–36.0)
MCV: 86.7 fL (ref 78.0–100.0)
PLATELETS: 449 10*3/uL — AB (ref 150–400)
RBC: 2.71 MIL/uL — ABNORMAL LOW (ref 3.87–5.11)
RDW: 15.1 % (ref 11.5–15.5)
WBC: 18.6 10*3/uL — AB (ref 4.0–10.5)

## 2018-06-30 LAB — URINE CULTURE: Culture: NO GROWTH

## 2018-06-30 LAB — BASIC METABOLIC PANEL
Anion gap: 7 (ref 5–15)
BUN: 17 mg/dL (ref 8–23)
CO2: 24 mmol/L (ref 22–32)
CREATININE: 0.79 mg/dL (ref 0.44–1.00)
Calcium: 7.7 mg/dL — ABNORMAL LOW (ref 8.9–10.3)
Chloride: 104 mmol/L (ref 98–111)
GFR calc Af Amer: >60 mL/min (ref >60)
GLUCOSE: 213 mg/dL — AB (ref 70–99)
Potassium: 3.8 mmol/L (ref 3.5–5.1)
SODIUM: 135 mmol/L (ref 135–145)

## 2018-06-30 LAB — GLUCOSE, CAPILLARY
GLUCOSE-CAPILLARY: 72 mg/dL (ref 70–99)
GLUCOSE-CAPILLARY: 77 mg/dL (ref 70–99)
Glucose-Capillary: 198 mg/dL — ABNORMAL HIGH (ref 70–99)
Glucose-Capillary: 191 mg/dL — ABNORMAL HIGH (ref 70–99)
Glucose-Capillary: 208 mg/dL — ABNORMAL HIGH (ref 70–99)
Glucose-Capillary: 161 mg/dL — ABNORMAL HIGH (ref 70–99)
Glucose-Capillary: 126 mg/dL — ABNORMAL HIGH (ref 70–99)

## 2018-06-30 LAB — PREPARE RBC (CROSSMATCH)

## 2018-06-30 LAB — IRON AND TIBC
Iron: 9 ug/dL — ABNORMAL LOW (ref 28–170)
SATURATION RATIOS: 7 % — AB (ref 10.4–31.8)
TIBC: 133 ug/dL — ABNORMAL LOW (ref 250–450)
UIBC: 124 ug/dL

## 2018-06-30 LAB — ABO/RH: ABO/RH(D): O POS

## 2018-06-30 LAB — FOLATE: FOLATE: 43 ng/mL (ref >5.9)

## 2018-06-30 LAB — TSH: TSH: 1.67 u[IU]/mL (ref 0.350–4.500)

## 2018-06-30 LAB — PROCALCITONIN: Procalcitonin: 0.24 ng/mL

## 2018-06-30 LAB — FERRITIN: FERRITIN: 136 ng/mL (ref 11–307)

## 2018-06-30 LAB — LACTIC ACID, PLASMA: Lactic Acid, Venous: 1.4 mmol/L (ref 0.5–1.9)

## 2018-06-30 LAB — T4, FREE: FREE T4: 1.37 ng/dL (ref 0.82–1.77)

## 2018-06-30 LAB — VITAMIN B12: VITAMIN B 12: 889 pg/mL (ref 180–914)

## 2018-06-30 MED ORDER — SODIUM CHLORIDE 0.9% IV SOLUTION
Freq: Once | INTRAVENOUS | Status: AC
  Administered 2018-06-30: 15:00:00 via INTRAVENOUS

## 2018-06-30 MED ORDER — INSULIN GLARGINE 100 UNIT/ML ~~LOC~~ SOLN
5.0000 [IU] | Freq: Every day | SUBCUTANEOUS | Status: DC
  Administered 2018-06-30 – 2018-07-01 (×2): 5 [IU] via SUBCUTANEOUS
  Filled 2018-06-30 (×5): qty 0.05

## 2018-06-30 MED ORDER — DILTIAZEM HCL 25 MG/5ML IV SOLN
5.0000 mg | Freq: Once | INTRAVENOUS | Status: AC
  Administered 2018-06-30: 5 mg via INTRAVENOUS
  Filled 2018-06-30 (×2): qty 5

## 2018-06-30 MED ORDER — LEVALBUTEROL HCL 0.63 MG/3ML IN NEBU
0.6300 mg | INHALATION_SOLUTION | Freq: Three times a day (TID) | RESPIRATORY_TRACT | Status: DC
  Administered 2018-06-30: 0.63 mg via RESPIRATORY_TRACT
  Filled 2018-06-30: qty 3

## 2018-06-30 MED ORDER — SODIUM CHLORIDE 0.9 % IV SOLN
1.0000 g | Freq: Three times a day (TID) | INTRAVENOUS | Status: DC
  Administered 2018-06-30 – 2018-07-05 (×15): 1 g via INTRAVENOUS
  Filled 2018-06-30 (×20): qty 1

## 2018-06-30 MED ORDER — INSULIN ASPART 100 UNIT/ML ~~LOC~~ SOLN
0.0000 [IU] | SUBCUTANEOUS | Status: DC
  Administered 2018-07-01: 2 [IU] via SUBCUTANEOUS
  Administered 2018-07-01: 5 [IU] via SUBCUTANEOUS
  Administered 2018-07-01: 2 [IU] via SUBCUTANEOUS
  Administered 2018-07-02 (×2): 3 [IU] via SUBCUTANEOUS
  Administered 2018-07-03: 1 [IU] via SUBCUTANEOUS
  Administered 2018-07-03: 3 [IU] via SUBCUTANEOUS
  Administered 2018-07-03: 5 [IU] via SUBCUTANEOUS
  Administered 2018-07-04: 7 [IU] via SUBCUTANEOUS
  Administered 2018-07-04: 2 [IU] via SUBCUTANEOUS
  Administered 2018-07-04: 7 [IU] via SUBCUTANEOUS
  Administered 2018-07-04 – 2018-07-05 (×2): 5 [IU] via SUBCUTANEOUS

## 2018-06-30 MED ORDER — LEVALBUTEROL HCL 0.63 MG/3ML IN NEBU
0.6300 mg | INHALATION_SOLUTION | Freq: Two times a day (BID) | RESPIRATORY_TRACT | Status: DC
  Administered 2018-07-01 – 2018-07-05 (×9): 0.63 mg via RESPIRATORY_TRACT
  Filled 2018-06-30 (×10): qty 3

## 2018-06-30 MED ORDER — DILTIAZEM HCL ER COATED BEADS 120 MG PO CP24
120.0000 mg | ORAL_CAPSULE | Freq: Every day | ORAL | Status: DC
  Administered 2018-06-30 – 2018-07-05 (×6): 120 mg via ORAL
  Filled 2018-06-30 (×7): qty 1

## 2018-06-30 MED ORDER — DEXTROSE 50 % IV SOLN
INTRAVENOUS | Status: AC
  Administered 2018-06-30: 05:00:00
  Filled 2018-06-30: qty 50

## 2018-06-30 MED ORDER — DEXTROSE-NACL 5-0.45 % IV SOLN
INTRAVENOUS | Status: DC
  Administered 2018-06-30: via INTRAVENOUS

## 2018-06-30 MED ORDER — ENOXAPARIN SODIUM 40 MG/0.4ML ~~LOC~~ SOLN
40.0000 mg | SUBCUTANEOUS | Status: DC
  Administered 2018-07-02 – 2018-07-05 (×4): 40 mg via SUBCUTANEOUS
  Filled 2018-06-30 (×4): qty 0.4

## 2018-06-30 MED ORDER — METOPROLOL TARTRATE 5 MG/5ML IV SOLN
2.5000 mg | Freq: Once | INTRAVENOUS | Status: AC
  Administered 2018-06-30: 2.5 mg via INTRAVENOUS
  Filled 2018-06-30: qty 5

## 2018-06-30 MED ORDER — INFLUENZA VAC SPLIT HIGH-DOSE 0.5 ML IM SUSY
0.5000 mL | PREFILLED_SYRINGE | INTRAMUSCULAR | Status: AC
  Administered 2018-07-01: 0.5 mL via INTRAMUSCULAR
  Filled 2018-06-30: qty 0.5

## 2018-06-30 MED ORDER — SODIUM CHLORIDE 0.9 % IV SOLN
INTRAVENOUS | Status: DC
  Administered 2018-06-30 (×2): via INTRAVENOUS

## 2018-06-30 NOTE — Progress Notes (Signed)
Pharmacy Antibiotic Note  [name] is a 84 y.o. female admitted on 06/29/2018 with UTI.  Pharmacy has been consulted for  aztreonam dosing.  Plan: Aztreonam then 1gm IV q8h F/U cxs and clinical progress Monitor V/S, labs   Height: 5\' 3"  (160 cm) Weight: 105 lb 13.1 oz (48 kg) IBW/kg (Calculated) : 52.4  Temp (24hrs), Avg:98.4 F (36.9 C), Min:98 F (36.7 C), Max:99.1 F (37.3 C)  Recent Labs  Lab 06/29/18 0030 06/29/18 0040 06/29/18 0325 06/29/18 0614 06/30/18 0534  WBC 17.6*  --   --  12.0* 18.6*  CREATININE 1.09*  --   --  0.91 0.79  LATICACIDVEN  --  3.87* 4.02*  --   --     Estimated Creatinine Clearance: 35.4 mL/min (by C-G formula based on SCr of 0.79 mg/dL).    Allergies  Allergen Reactions  . Aspirin Other (See Comments)    Cannot take uncoated due to stomach ulcers  . Megace [Megestrol]     abd pain   . Rocephin [Ceftriaxone Sodium In Dextrose] Swelling    Antimicrobials this admission: Vancomycin 9/24>>9/24 Aztreonam  9/24>> 9/24 restarted 9/25>> Metronidazole 9/24>>  cipro 9/24>>9/2  Dose adjustments this admission: N/A  Microbiology results: 9/24 BCx: ngtd 9/24 UCx: pending  9/24 MRSA PCR: Neg  Thank you for allowing pharmacy to be a part of this patient's care.  Isac Sarna, BS Pharm D, [place] Clinical Pharmacist Pager [phone] 06/30/2018 7:53 AM

## 2018-06-30 NOTE — Progress Notes (Signed)
Called MD to let know of patient condition.  MD wanted another ampule of d50 give to help stabilize blood glucose. Ampule given.  Heart rate still tachycardic, but patient stable with no complaints of pain.

## 2018-06-30 NOTE — Progress Notes (Signed)
Inpatient Diabetes Program Recommendations  AACE/ADA: New Consensus Statement on Inpatient Glycemic Control (2015)  Target Ranges:  Prepandial:   less than 140 mg/dL      Peak postprandial:   less than 180 mg/dL (1-2 hours)      Critically ill patients:  140 - 180 mg/dL   Lab Results  Component Value Date   GLUCAP 208 (H) 06/30/2018   HGBA1C 9.2 (H) 06/29/2018    Review of Glycemic Control  Diabetes history: DM2 Outpatient Diabetes medications: Lantus 5 units QHS, Novolog 4 units tidwc, metformin 1000 mg bid Current orders for Inpatient glycemic control: Lantus 5 units QHS, Novolog 0-15 units tidwc  Hypoglycemia of 48 mg/dL at 2155 on 9/24, likely d/t Novolog dose at dinner meal. Did not receive Lantus last night.  Inpatient Diabetes Program Recommendations:     May consider decreasing Novolog correction to 0-9 units tidwc.  Will follow closely.  Thank you. Lorenda Peck, RD, LDN, CDE Inpatient Diabetes Coordinator [phone]

## 2018-06-30 NOTE — Progress Notes (Signed)
Blood consent signed by daughter due to patient's contractures, placed in chart. Notified by lab that patient's blood has antibodies so it will be sent off to be worked up, and then returned to transfuse. MD notified, family and patient notified. Will continue to monitor.

## 2018-06-30 NOTE — Progress Notes (Signed)
PROGRESS NOTE  [name] SWF:[id number] DOB: 1926-01-22 DOA: 06/29/2018 PCP: Moshe Cipro, MD  HPI/Recap of past 24 hours: HPI from Dr Eleonore Chiquito [name]  is a 84 y.o. female, history of rheumatoid arthritis, breast cancer, CAD, diabetes mellitus type 2, hypertension, hyperlipidemia, who was brought to hospital due to elevated blood glucose and increased lethargy at home.  Patient has multiple admissions for the same in the past. Patient is bedbound with sacral decubitus ulcers. In the ED, patient was found to be have a temperature 100.3, tachycardic, lactic acid elevated at 4.02.  Pt was started on empiric antibiotics, UA positive for infection, Chest x-ray shows no infiltrate. Pt admitted for further management. On 06/29/18 pt AB was de-escalated to Ciprofloxacin.  Overnight, pt noted to have an episode of hypoglycemia, with SVT with HR in 150s. Pt was given D50 and given IV lopressor as family refused adenosine. Today, pt still lethargic, denies any chest pain, abdominal pain, N/V, fever/chills. HR is better. Very poor prognosis, had an extensive discussion with daughters still wants mother to be full code. Will consult palliative for their expertise.     Assessment/Plan: Active Problems:   Sepsis ([place])  ??Sepsis Vs SIRS due to hyperglycemia Unknown etiology for sepsis, ??infected decubitus ulcer Currently afebrile, with leukocytosis (appears chronic) Lactic acid elevated at 4.02 (?sepsis Vs hyperglycemia), will trend Procalcitonin pending UA with large leukocytes, WBC >50 UC shows no growth BC X 2 NGTD CXR no acute cardiopulmonary abnormality Restarted IV Aztreonam only (MRSA PCR negative) Continue IVF Monitor closely  Diabetes Mellitus type 2 Uncontrolled with hyperglycemia on presentation, overnight with an episode of hypoglycemia Last A1c 9.2 (poorly controlled) Continue SSI, lantus 5U at bedtime, accuchecks Monitor closely  SVT  Currently controlled Noted to  have SVT with HR 150s overnight, likely triggered by ?hypoglycemia EKG showed SVT TSH, free T4 pending S/P IV lopressor, continue home Cardizem Monitor closely as BP soft  Normocytic anemia Baseline hgb around 9, currently 7.1 No signs of active bleeding Anemia panel pending Type and screen pending, will transfuse 2U of PRBC Daily CBC  Hypertension BP currently soft Continue IVF, will continue Cardizem due to current SVT Monitor closely   Multiple decubitus ulcer stage 3 on sacrum/bilateral buttocks/upper back/post shoulder Wound care on board, doesn't appear infected Dressing as recommended by WOC    Code Status: Full   Family Communication: Very poor prognosis, had an extensive discussion with daughters, still wants mother to be full code. Will consult palliative for their expertise.    Disposition Plan: Home once more stable and work up complete   Consultants:  None  Procedures:  None   Antimicrobials:  Aztreonam  DVT prophylaxis:  Lovenox   Objective: Vitals:   06/29/18 2356 06/30/18 0500 06/30/18 0745 06/30/18 1326  BP: (!) 111/51 (!) 101/51  (!) 96/53  Pulse: (!) 146   98  Resp: 16   18  Temp: 98 F (36.7 C)     TempSrc: Oral     SpO2: 100%  98% 100%  Weight: 48 kg     Height:        Intake/Output Summary (Last 24 hours) at 06/30/2018 1327 Last data filed at 06/30/2018 1100 Gross per 24 hour  Intake 1652.92 ml  Output 800 ml  Net 852.92 ml   Filed Weights   06/29/18 0055 06/29/18 0456 06/29/18 2356  Weight: 46.2 kg 47.8 kg 48 kg    Exam:   General: Lethargic, chronically ill appearing, NAD  Cardiovascular: S1, S2 present   Respiratory: Poor respiratory effort  Abdomen: Soft, NT, ND, BS present  Musculoskeletal: No pedal edema bilaterally   Skin: Multiple decubitus ulcer stage 3 on sacrum/bilateral buttocks/upper back/post shoulder   Psychiatry: Unable to assess   Data Reviewed: CBC: Recent Labs  Lab 06/29/18 0030  06/29/18 0614 06/30/18 0534  WBC 17.6* 12.0* 18.6*  NEUTROABS 14.2*  --   --   HGB 8.1* 7.6* 7.1*  HCT 27.0* 25.5* 23.5*  MCV 87.4 87.0 86.7  PLT 482* 433* 270*   Basic Metabolic Panel: Recent Labs  Lab 06/29/18 0030 06/29/18 0614 06/30/18 0534  NA 133* 137 135  K 4.8 4.3 3.8  CL 97* 104 104  CO2 26 25 24   GLUCOSE 365* 295* 213*  BUN 32* 24* 17  CREATININE 1.09* 0.91 0.79  CALCIUM 9.1 8.1* 7.7*   GFR: Estimated Creatinine Clearance: 35.4 mL/min (by C-G formula based on SCr of 0.79 mg/dL). Liver Function Tests: Recent Labs  Lab 06/29/18 0030 06/29/18 0614  AST 16 13*  ALT 10 10  ALKPHOS 82 71  BILITOT 0.2* 0.5  PROT 7.0 6.1*  ALBUMIN 2.6* 2.1*   No results for input(s): LIPASE, AMYLASE in the last 168 hours. No results for input(s): AMMONIA in the last 168 hours. Coagulation Profile: No results for input(s): INR, PROTIME in the last 168 hours. Cardiac Enzymes: No results for input(s): CKTOTAL, CKMB, CKMBINDEX, TROPONINI in the last 168 hours. BNP (last 3 results) No results for input(s): PROBNP in the last 8760 hours. HbA1C: Recent Labs    06/29/18 0030  HGBA1C 9.2*   CBG: Recent Labs  Lab 06/29/18 2359 06/30/18 0148 06/30/18 0405 06/30/18 0824 06/30/18 1119  GLUCAP 77 198* 191* 208* 161*   Lipid Profile: No results for input(s): CHOL, HDL, LDLCALC, TRIG, CHOLHDL, LDLDIRECT in the last 72 hours. Thyroid Function Tests: No results for input(s): TSH, T4TOTAL, FREET4, T3FREE, THYROIDAB in the last 72 hours. Anemia Panel: No results for input(s): VITAMINB12, FOLATE, FERRITIN, TIBC, IRON, RETICCTPCT in the last 72 hours. Urine analysis:    Component Value Date/Time   COLORURINE YELLOW 06/29/2018 0330   APPEARANCEUR CLOUDY (A) 06/29/2018 0330   LABSPEC 1.014 06/29/2018 0330   PHURINE 5.0 06/29/2018 0330   GLUCOSEU >=500 (A) 06/29/2018 0330   HGBUR SMALL (A) 06/29/2018 0330   BILIRUBINUR NEGATIVE 06/29/2018 0330   KETONESUR NEGATIVE 06/29/2018  0330   PROTEINUR 100 (A) 06/29/2018 0330   UROBILINOGEN 0.2 06/30/2015 1317   NITRITE NEGATIVE 06/29/2018 0330   LEUKOCYTESUR LARGE (A) 06/29/2018 0330   Sepsis Labs: @LABRCNTIP (procalcitonin:4,lacticidven:4)  ) Recent Results (from the past 240 hour(s))  Blood Culture (routine x 2)     Status: None (Preliminary result)   Collection Time: 06/29/18 12:30 AM  Result Value Ref Range Status   Specimen Description BLOOD RIGHT HAND  Final   Special Requests   Final    BOTTLES DRAWN AEROBIC AND ANAEROBIC Blood Culture results may not be optimal due to an inadequate volume of blood received in culture bottles   Culture   Final    NO GROWTH 1 DAY Performed at [facility], [address] [place], [place] [address]    Report Status PENDING  Incomplete  Blood Culture (routine x 2)     Status: None (Preliminary result)   Collection Time: 06/29/18 12:45 AM  Result Value Ref Range Status   Specimen Description BLOOD RIGHT ARM  Final   Special Requests   Final    BOTTLES  DRAWN AEROBIC AND ANAEROBIC Blood Culture results may not be optimal due to an inadequate volume of blood received in culture bottles   Culture   Final    NO GROWTH 1 DAY Performed at [facility], [address], [place], [place] [address]    Report Status PENDING  Incomplete  Culture, Urine     Status: None   Collection Time: 06/29/18  4:21 AM  Result Value Ref Range Status   Specimen Description   Final    URINE, RANDOM Performed at [facility], [address] [place], [place] [address]    Special Requests   Final    NONE Performed at [facility], [address], [place], [place] [address]    Culture   Final    NO GROWTH Performed at [facility], [place] [address] [place], [place] [address]    Report Status 06/30/2018 FINAL  Final  MRSA PCR Screening     Status: None   Collection Time: 06/29/18  4:37 AM  Result Value Ref Range Status   MRSA by PCR NEGATIVE NEGATIVE Final    Comment:        The GeneXpert  MRSA Assay (FDA approved for NASAL specimens only), is one component of a comprehensive MRSA colonization surveillance program. It is not intended to diagnose MRSA infection nor to guide or monitor treatment for MRSA infections. Performed at [facility], [address] [place], [place] [address]       Studies: No results found.  Scheduled Meds: . aspirin EC  81 mg Oral BID  . atorvastatin  10 mg Oral QPM  . cholecalciferol  2,000 Units Oral Daily  . diltiazem  120 mg Oral Daily  . [START ON 07/01/2018] enoxaparin (LOVENOX) injection  40 mg Subcutaneous Q24H  . ferrous [id number] C-folic acid  1 capsule Oral Daily  . insulin aspart  0-5 Units Subcutaneous QHS  . insulin aspart  0-9 Units Subcutaneous Q4H  . insulin glargine  5 Units Subcutaneous QHS  . ipratropium-albuterol  3 mL Nebulization BID  . mometasone-formoterol  2 puff Inhalation BID  . pantoprazole  40 mg Oral Daily  . predniSONE  5 mg Oral Q breakfast    Continuous Infusions: . sodium chloride 75 mL/hr at 06/30/18 0750  . aztreonam 1 g (06/30/18 1001)     LOS: 1 day     Alma Friendly, MD [facility]   If 7PM-7AM, please contact night-coverage [email or web address]  06/30/2018, 1:27 PM

## 2018-06-30 NOTE — Progress Notes (Signed)
Patient update given to MD.  CBG has increased to 198, patient still tachycardic, but resting with no complaints of pain.  Will continue to monitor patient.

## 2018-07-01 ENCOUNTER — Encounter (HOSPITAL_COMMUNITY): Payer: Self-pay | Admitting: Primary Care

## 2018-07-01 DIAGNOSIS — Z515 Encounter for palliative care: Secondary | ICD-10-CM

## 2018-07-01 DIAGNOSIS — Z7189 Other specified counseling: Secondary | ICD-10-CM

## 2018-07-01 LAB — CBC WITH DIFFERENTIAL/PLATELET
BASOS PCT: 0 %
Basophils Absolute: 0 10*3/uL (ref 0.0–0.1)
EOS ABS: 0.1 10*3/uL (ref 0.0–0.7)
EOS PCT: 0 %
HCT: 33.1 % — ABNORMAL LOW (ref 36.0–46.0)
Hemoglobin: 10.7 g/dL — ABNORMAL LOW (ref 12.0–15.0)
LYMPHS ABS: 1.2 10*3/uL (ref 0.7–4.0)
Lymphocytes Relative: 6 %
MCH: 28.2 pg (ref 26.0–34.0)
MCHC: 32.3 g/dL (ref 30.0–36.0)
MCV: 87.3 fL (ref 78.0–100.0)
MONO ABS: 0.6 10*3/uL (ref 0.1–1.0)
MONOS PCT: 3 %
NEUTROS PCT: 91 %
Neutro Abs: 18.4 10*3/uL — ABNORMAL HIGH (ref 1.7–7.7)
PLATELETS: 405 10*3/uL — AB (ref 150–400)
RBC: 3.79 MIL/uL — ABNORMAL LOW (ref 3.87–5.11)
RDW: 14.6 % (ref 11.5–15.5)
WBC: 20.3 10*3/uL — ABNORMAL HIGH (ref 4.0–10.5)

## 2018-07-01 LAB — BASIC METABOLIC PANEL
Anion gap: 5 (ref 5–15)
BUN: 14 mg/dL (ref 8–23)
CALCIUM: 7.6 mg/dL — AB (ref 8.9–10.3)
CO2: 23 mmol/L (ref 22–32)
CREATININE: 0.64 mg/dL (ref 0.44–1.00)
Chloride: 108 mmol/L (ref 98–111)
GFR calc non Af Amer: >60 mL/min (ref >60)
Glucose, Bld: 183 mg/dL — ABNORMAL HIGH (ref 70–99)
Potassium: 3.9 mmol/L (ref 3.5–5.1)
SODIUM: 136 mmol/L (ref 135–145)

## 2018-07-01 LAB — GLUCOSE, CAPILLARY
GLUCOSE-CAPILLARY: 194 mg/dL — AB (ref 70–99)
GLUCOSE-CAPILLARY: 264 mg/dL — AB (ref 70–99)
Glucose-Capillary: 113 mg/dL — ABNORMAL HIGH (ref 70–99)
Glucose-Capillary: 79 mg/dL (ref 70–99)
Glucose-Capillary: 183 mg/dL — ABNORMAL HIGH (ref 70–99)

## 2018-07-01 LAB — PROCALCITONIN: Procalcitonin: 0.16 ng/mL

## 2018-07-01 MED ORDER — ALBUTEROL SULFATE (2.5 MG/3ML) 0.083% IN NEBU: 2.5000 mg | INHALATION_SOLUTION | Freq: Four times a day (QID) | RESPIRATORY_TRACT | Status: DC | PRN

## 2018-07-01 MED ORDER — ORAL CARE MOUTH RINSE
15.0000 mL | Freq: Two times a day (BID) | OROMUCOSAL | Status: DC
  Administered 2018-07-01 – 2018-07-05 (×6): 15 mL via OROMUCOSAL

## 2018-07-01 NOTE — Progress Notes (Signed)
PROGRESS NOTE  [name] TAV:[id number] DOB: Nov 09, 1925 DOA: 06/29/2018 PCP: Moshe Cipro, MD  HPI/Recap of past 24 hours: HPI from Dr Eleonore Chiquito Detria Cummings  is a 84 y.o. female, history of rheumatoid arthritis, breast cancer, CAD, diabetes mellitus type 2, hypertension, hyperlipidemia, bed-bound who was brought to hospital due to elevated blood glucose and increased lethargy at home.  Patient has multiple admissions for the same in the past. Patient is bedbound with sacral decubitus ulcers. In the ED, patient was found to be have a temperature 100.3, tachycardic, lactic acid elevated at 4.02.  Pt was started on empiric antibiotics, UA positive for infection, Chest x-ray shows no infiltrate. Pt admitted for further management. On 06/29/18 pt AB was de-escalated to Ciprofloxacin.  Today, pt noted to still be lethargic/sleepy, denies any new complaints. Spoke extensively again to one of the POA (daughter) as to [place]. She will discuss with the other daughter who is also a POA, as well as the palliative team. Daughter also wanted to know more about PEG tube placement for feeding.   Assessment/Plan: Active Problems:   Sepsis ([place])  ??Sepsis Vs SIRS due to hyperglycemia Unknown etiology for sepsis, ??infected decubitus ulcer Currently afebrile, with leukocytosis (appears chronic) Lactic acid elevated at 4.02-->1.4 Procalcitonin 0.24-->0.16 UA with large leukocytes, WBC >50 UC shows no growth BC X 2 NGTD CXR no acute cardiopulmonary abnormality Restarted IV Aztreonam only (MRSA PCR negative), may consider d/c in am Monitor closely  Diabetes Mellitus type 2 Uncontrolled with hyperglycemia on presentation Last A1c 9.2 (poorly controlled) Continue SSI, lantus 5U at bedtime, accuchecks Monitor closely  SVT  Currently controlled Noted to have SVT with HR 150s, likely triggered by ?hypoglycemia EKG showed SVT TSH 1.67, free T4 1.37 S/P IV lopressor, continue home Cardizem Monitor  closely as BP soft  Normocytic anemia Baseline hgb around 9, currently 10.7 s/p 2U of PRBC No signs of active bleeding Anemia panel: Iron 9, sats 7% Continue iron supplementation Daily CBC  Hypertension BP stable Continue Cardizem due to current SVT Monitor closely   Multiple decubitus ulcer stage 3 on sacrum/bilateral buttocks/upper back/post shoulder Wound care on board Dressing as recommended by WOC    Code Status: Full   Family Communication: Spoke extensively again to one of the POA (daughter) as to [place]. She will discuss with the other daughter who is also a POA, as well as the palliative team. Daughter also wanted to know more about PEG tube placement for feeding  Disposition Plan: Home once more stable and work up complete. Family refusing SNF   Consultants:  None  Procedures:  None   Antimicrobials:  Aztreonam  DVT prophylaxis:  Lovenox   Objective: Vitals:   07/01/18 0604 07/01/18 0640 07/01/18 0740 07/01/18 0910  BP: (!) 121/49 129/62  126/63  Pulse: 93 93  99  Resp: 14 14  16   Temp: 98.3 F (36.8 C) 98.4 F (36.9 C)    TempSrc: Oral Oral    SpO2: 100% 100% 98% 100%  Weight:      Height:        Intake/Output Summary (Last 24 hours) at 07/01/2018 1546 Last data filed at 07/01/2018 1300 Gross per 24 hour  Intake 1202.66 ml  Output 1800 ml  Net -597.34 ml   Filed Weights   06/29/18 0055 06/29/18 0456 06/29/18 2356  Weight: 46.2 kg 47.8 kg 48 kg    Exam:   General: Lethargic, chronically ill appearing, NAD   Cardiovascular: S1, S2 present  Respiratory: Poor respiratory effort  Abdomen: Soft, NT, ND, BS present  Musculoskeletal: No pedal edema bilaterally   Skin: Multiple decubitus ulcer stage 3 on sacrum/bilateral buttocks/upper back/post shoulder   Psychiatry: Unable to assess   Data Reviewed: CBC: Recent Labs  Lab 06/29/18 0030 06/29/18 0614 06/30/18 0534 07/01/18 1045  WBC 17.6* 12.0* 18.6* 20.3*  NEUTROABS 14.2*   --   --  18.4*  HGB 8.1* 7.6* 7.1* 10.7*  HCT 27.0* 25.5* 23.5* 33.1*  MCV 87.4 87.0 86.7 87.3  PLT 482* 433* 449* 250*   Basic Metabolic Panel: Recent Labs  Lab 06/29/18 0030 06/29/18 0614 06/30/18 0534 07/01/18 1045  NA 133* 137 135 136  K 4.8 4.3 3.8 3.9  CL 97* 104 104 108  CO2 26 25 24 23   GLUCOSE 365* 295* 213* 183*  BUN 32* 24* 17 14  CREATININE 1.09* 0.91 0.79 0.64  CALCIUM 9.1 8.1* 7.7* 7.6*   GFR: Estimated Creatinine Clearance: 35.4 mL/min (by C-G formula based on SCr of 0.64 mg/dL). Liver Function Tests: Recent Labs  Lab 06/29/18 0030 06/29/18 0614  AST 16 13*  ALT 10 10  ALKPHOS 82 71  BILITOT 0.2* 0.5  PROT 7.0 6.1*  ALBUMIN 2.6* 2.1*   No results for input(s): LIPASE, AMYLASE in the last 168 hours. No results for input(s): AMMONIA in the last 168 hours. Coagulation Profile: No results for input(s): INR, PROTIME in the last 168 hours. Cardiac Enzymes: No results for input(s): CKTOTAL, CKMB, CKMBINDEX, TROPONINI in the last 168 hours. BNP (last 3 results) No results for input(s): PROBNP in the last 8760 hours. HbA1C: Recent Labs    06/29/18 0030  HGBA1C 9.2*   CBG: Recent Labs  Lab 06/30/18 1604 06/30/18 2202 07/01/18 0227 07/01/18 0758 07/01/18 1131  GLUCAP 72 126* 113* 79 183*   Lipid Profile: No results for input(s): CHOL, HDL, LDLCALC, TRIG, CHOLHDL, LDLDIRECT in the last 72 hours. Thyroid Function Tests: Recent Labs    06/30/18 1452  TSH 1.670  FREET4 1.37   Anemia Panel: Recent Labs    06/30/18 1452  VITAMINB12 889  FOLATE 43.0  FERRITIN 136  TIBC 133*  IRON 9*   Urine analysis:    Component Value Date/Time   COLORURINE YELLOW 06/29/2018 0330   APPEARANCEUR CLOUDY (A) 06/29/2018 0330   LABSPEC 1.014 06/29/2018 0330   PHURINE 5.0 06/29/2018 0330   GLUCOSEU >=500 (A) 06/29/2018 0330   HGBUR SMALL (A) 06/29/2018 0330   BILIRUBINUR NEGATIVE 06/29/2018 0330   KETONESUR NEGATIVE 06/29/2018 0330   PROTEINUR 100 (A)  06/29/2018 0330   UROBILINOGEN 0.2 06/30/2015 1317   NITRITE NEGATIVE 06/29/2018 0330   LEUKOCYTESUR LARGE (A) 06/29/2018 0330   Sepsis Labs: @LABRCNTIP (procalcitonin:4,lacticidven:4)  ) Recent Results (from the past 240 hour(s))  Blood Culture (routine x 2)     Status: None (Preliminary result)   Collection Time: 06/29/18 12:30 AM  Result Value Ref Range Status   Specimen Description BLOOD RIGHT HAND  Final   Special Requests   Final    BOTTLES DRAWN AEROBIC AND ANAEROBIC Blood Culture results may not be optimal due to an inadequate volume of blood received in culture bottles   Culture   Final    NO GROWTH 2 DAYS Performed at [facility], [address] [place], [place] [address]    Report Status PENDING  Incomplete  Blood Culture (routine x 2)     Status: None (Preliminary result)   Collection Time: 06/29/18 12:45 AM  Result Value Ref  Range Status   Specimen Description BLOOD RIGHT ARM  Final   Special Requests   Final    BOTTLES DRAWN AEROBIC AND ANAEROBIC Blood Culture results may not be optimal due to an inadequate volume of blood received in culture bottles   Culture   Final    NO GROWTH 2 DAYS Performed at [facility], [address] [place], [place] [address]    Report Status PENDING  Incomplete  Culture, Urine     Status: None   Collection Time: 06/29/18  4:21 AM  Result Value Ref Range Status   Specimen Description   Final    URINE, RANDOM Performed at [facility], [address] [place], [place] [address]    Special Requests   Final    NONE Performed at [facility], [address] [place], [place] [address]    Culture   Final    NO GROWTH Performed at [facility], [place] [address] [place], [place] [address]    Report Status 06/30/2018 FINAL  Final  MRSA PCR Screening     Status: None   Collection Time: 06/29/18  4:37 AM  Result Value Ref Range Status   MRSA by PCR NEGATIVE NEGATIVE Final    Comment:        The GeneXpert MRSA Assay  (FDA approved for NASAL specimens only), is one component of a comprehensive MRSA colonization surveillance program. It is not intended to diagnose MRSA infection nor to guide or monitor treatment for MRSA infections. Performed at [facility], [address], [place], [place] [address]       Studies: No results found.  Scheduled Meds: . aspirin EC  81 mg Oral BID  . atorvastatin  10 mg Oral QPM  . cholecalciferol  2,000 Units Oral Daily  . diltiazem  120 mg Oral Daily  . enoxaparin (LOVENOX) injection  40 mg Subcutaneous Q24H  . ferrous [id number] C-folic acid  1 capsule Oral Daily  . insulin aspart  0-5 Units Subcutaneous QHS  . insulin aspart  0-9 Units Subcutaneous Q4H  . insulin glargine  5 Units Subcutaneous QHS  . levalbuterol  0.63 mg Nebulization BID  . mouth rinse  15 mL Mouth Rinse BID  . mometasone-formoterol  2 puff Inhalation BID  . pantoprazole  40 mg Oral Daily  . predniSONE  5 mg Oral Q breakfast    Continuous Infusions: . aztreonam 1 g (07/01/18 1417)     LOS: 2 days     Alma Friendly, MD [facility]   If 7PM-7AM, please contact night-coverage [email or web address]  07/01/2018, 3:46 PM

## 2018-07-01 NOTE — Consult Note (Signed)
Consultation Note Date: 07/01/2018   Patient Name: [name]  DOB: 01/25/1926  MRN: [id number]  Age / Sex: 84 y.o., female  PCP: Moshe Cipro, MD Referring Physician: Alma Friendly, MD  Reason for Consultation: Establishing goals of care  HPI/Patient Profile: 84 y.o. female  with past medical history of high blood pressure and cholesterol, coronary artery disease, diabetes, anemia, asthma, breast cancer with left mastectomy, osteoporosis, kyphosis, bedbound status, admitted on 06/29/2018 with sepsis/Sirs likely source is urine, stage II decubitus.   Clinical Assessment and Goals of Care: [name] is lying quietly in bed.  Her eyes are closed and she appears relatively comfortable.  She wakes easily when I touch her and call her name.  She is able to make her basic needs known, and she denies hunger or pain.  Present today at bedside his daughter, [name].  [name] shares that [name] has an aide throughout the day and the 4 sisters take turns in the evenings/overnight.  [name] tells me that she had questions about PEG to.  She shares that speech therapy has been to see her mother and recommends continue feeding by mouth.  We speak in detail about the risks of PEG tube including increased incidence of bedsores, body image disturbance, risk for infection, and the pleasure of eating by mouth.  I share that PEG tube feeding does not change chronic illness pathway.  [name] tells me that her youngest sister does not want a PEG tube, [name] shares that she had questions, but is not interested in PEG tube.  We talked about [name]'s acute and chronic health concerns. We talked about the chronic illness pathway, I share a diagram of what is normal and expected.  We talked about colonization of bacteria in the urine, further UTIs are expected.  We talked about how to make choices for loved ones  including 1) keep them at the center of decision-making, 2) are we doing something for her or to her (can we change what is happening), 3) what would the woman [name] was 10 years ago tell us now.  We plan for follow-up meeting 9/27 around 10 AM.  Conversation with hospitalist related to plan of care, CODE STATUS discussions.   Healthcare power of attorney HCPOA-sister state healthcare power of attorney is held by [employer] will, [name] is second.  [name] tells me today that all 4 sisters including Baker Janus and develop make choices as a team.   SUMMARY OF RECOMMENDATIONS   Continue full scope treatment. Continue to rehospitalize as needed. Continue CODE STATUS discussions.  Code Status/Advance Care Planning:  Full code -CODE STATUS discussed.  We talked about the concept of "treat the treatable".  I share that with modern medicine, if we are doing what we can to help her, and her heart naturally stops, I am worried that CPR would not be effective.  We talked about continuing to care for her regardless.  Symptom Management:   Per hospitalist, no additional needs at this time.  Palliative  Prophylaxis:   Aspiration, Palliative Wound Care and Turn Reposition  Additional Recommendations (Limitations, Scope, Preferences):  Full Scope Treatment  Psycho-social/Spiritual:   Desire for further Chaplaincy support:no  Additional Recommendations: Caregiving  Support/Resources and Education on Hospice  Prognosis:   < 6 months, would not be surprising based on 4 hospitalizations and 2 ED visits in the last 6 months, frailty, bedbound status, stage II decubitus that is nonhealing.  Discharge Planning: To be determined, anticipate return to home      Primary Diagnoses: Present on Admission: . Sepsis ([place])   I have reviewed the medical record, interviewed the patient and family, and examined the patient. The following aspects are pertinent.  Past Medical History:  Diagnosis  Date  . Acid reflux   . Anemia   . Arthritis    ra  . Asthma   . Cancer [facility])    breast cancer  . Coronary artery disease   . Diabetes mellitus without complication ([place])   . Diverticulitis   . Gastric ulcer   . Hyperlipidemia   . Hypertension   . Hypoxemia   . Kyphosis   . Kyphosis   . Mitral valve disorder   . Osteoporosis   . Oxygen dependent    2 liters at night  . Raynaud disease   . Thrombocytopenia ([place])    Social History   Socioeconomic History  . Marital status: Widowed    Spouse name: Not on file  . Number of children: Not on file  . Years of education: Not on file  . Highest education level: Not on file  Occupational History  . Not on file  Social Needs  . Financial resource strain: Not on file  . Food insecurity:    Worry: Not on file    Inability: Not on file  . Transportation needs:    Medical: Not on file    Non-medical: Not on file  Tobacco Use  . Smoking status: Never Smoker  . Smokeless tobacco: Never Used  Substance and Sexual Activity  . Alcohol use: No  . Drug use: No  . Sexual activity: Never  Lifestyle  . Physical activity:    Days per week: Not on file    Minutes per session: Not on file  . Stress: Not on file  Relationships  . Social connections:    Talks on phone: Not on file    Gets together: Not on file    Attends religious service: Not on file    Active member of club or organization: Not on file    Attends meetings of clubs or organizations: Not on file    Relationship status: Not on file  Other Topics Concern  . Not on file  Social History Narrative  . Not on file   Family History  Problem Relation Age of Onset  . Asthma Sister   . Rheum arthritis Daughter    Scheduled Meds: . aspirin EC  81 mg Oral BID  . atorvastatin  10 mg Oral QPM  . cholecalciferol  2,000 Units Oral Daily  . diltiazem  120 mg Oral Daily  . enoxaparin (LOVENOX) injection  40 mg Subcutaneous Q24H  . ferrous [id number] C-folic  acid  1 capsule Oral Daily  . insulin aspart  0-5 Units Subcutaneous QHS  . insulin aspart  0-9 Units Subcutaneous Q4H  . insulin glargine  5 Units Subcutaneous QHS  . levalbuterol  0.63 mg Nebulization BID  . mometasone-formoterol  2 puff Inhalation BID  . pantoprazole  40 mg Oral Daily  . predniSONE  5 mg Oral Q breakfast   Continuous Infusions: . aztreonam 1 g (07/01/18 1417)   PRN Meds:.ondansetron **OR** ondansetron (ZOFRAN) IV, polyvinyl alcohol Medications Prior to Admission:  Prior to Admission medications   Medication Sig Start Date End Date Taking? Authorizing Provider  acetaminophen (TYLENOL) 325 MG tablet Take 650 mg by mouth every 6 (six) hours as needed for mild pain or headache.   Yes [provider]  aspirin EC 81 MG tablet Take 81 mg by mouth 2 (two) times daily.    Yes [provider]  atorvastatin (LIPITOR) 10 MG tablet Take 10 mg by mouth every evening.    Yes [provider]  benzonatate (TESSALON) 100 MG capsule Take 1 capsule (100 mg total) by mouth 3 (three) times daily. 03/11/18  Yes Manuella Ghazi, Pratik D, DO  budesonide-formoterol (SYMBICORT) 160-4.5 MCG/ACT inhaler Inhale 2 puffs into the lungs 2 (two) times daily.   Yes [provider]  Cholecalciferol (VITAMIN D3) 2000 units capsule Take 2,000 Units by mouth daily.    Yes [provider]  D-MANNOSE PO Take 500 mg by mouth daily.    Yes [provider]  diltiazem (CARDIZEM CD) 120 MG 24 hr capsule Take 120 mg by mouth daily.   Yes [provider]  guaiFENesin (MUCINEX) 600 MG 12 hr tablet Take 1 tablet (600 mg total) by mouth 2 (two) times daily. Patient taking differently: Take 600 mg by mouth 2 (two) times daily as needed for cough or to loosen phlegm.  03/07/17  Yes Kathie Dike, MD  insulin aspart (NOVOLOG) 100 UNIT/ML FlexPen Inject 4 Units into the skin 3 (three) times daily with meals. 03/07/17  Yes Kathie Dike, MD  Insulin Glargine (LANTUS) 100  UNIT/ML Solostar Pen Inject 5 Units into the skin daily. Patient taking differently: Inject 5 Units into the skin at bedtime.  03/07/17  Yes Kathie Dike, MD  ipratropium-albuterol (DUONEB) 0.5-2.5 (3) MG/3ML SOLN Take 3 mLs by nebulization 2 (two) times daily. 03/07/17  Yes Kathie Dike, MD  lactobacillus acidophilus & bulgar (LACTINEX) chewable tablet CHEW 1 TABLET BY MOUTH EVERY DAY 09/17/15  Yes [provider]  magnesium oxide (MAG-OX) 400 (241.3 Mg) MG tablet Take 0.5 tablets (200 mg total) by mouth daily. Patient taking differently: Take 200-400 mg by mouth daily. Alternating 200 mg one day then 400 mg the next. 04/15/16  Yes Reyne Dumas, MD  metFORMIN (GLUCOPHAGE) 500 MG tablet Take 1,000 mg by mouth 2 (two) times daily with a meal.   Yes [provider]  metoprolol succinate (TOPROL-XL) 25 MG 24 hr tablet Take 0.5 tablets (12.5 mg total) by mouth daily. Patient taking differently: Take 25 mg by mouth every morning.  01/11/18  Yes Johnson, Clanford L, MD  Multiple Vitamins-Minerals (CENTRUM SILVER ADULT 50+ PO) Take 1 tablet by mouth every morning.    Yes [provider]  nitrofurantoin (MACRODANTIN) 50 MG capsule Take 50 mg by mouth daily.   Yes [provider]  pantoprazole (PROTONIX) 40 MG tablet Take 40 mg by mouth daily.   Yes [provider]  POLY-IRON 150 FORTE 150-25-1 MG-MCG-MG CAPS TAKE ONE CAPSULE BY MOUTH DAILY 04/07/17  Yes Kefalas, Manon Hilding, PA-C  Polyvinyl Alcohol-Povidone (CLEAR EYES ALL SEASONS) 5-6 MG/ML SOLN Apply 1 drop to eye every morning.    Yes [provider]  predniSONE (DELTASONE) 5 MG tablet Take 1 tablet (5 mg total) by mouth daily with breakfast. 02/12/18  Yes Johnson, Clanford L, MD  raloxifene (EVISTA) 60 MG tablet Take 60 mg by mouth daily.   Yes [provider]  zinc gluconate 50 MG tablet Take 50 mg by mouth daily.   Yes [provider]  traMADol (ULTRAM) 50 MG tablet Take 50 mg by mouth  every 6 (six) hours as needed for moderate pain.     [provider]   Allergies  Allergen Reactions  . Aspirin Other (See Comments)    Cannot take uncoated due to stomach ulcers  . Megace [Megestrol]     abd pain   . Rocephin [Ceftriaxone Sodium In Dextrose] Swelling   Review of Systems  Unable to perform ROS: Age    Physical Exam  Constitutional: No distress.  Appears frail, contracted, chronically ill  HENT:  Head: Atraumatic.  Some temporal wasting  Cardiovascular: Normal rate.  Pulmonary/Chest: Effort normal. No respiratory distress.  Musculoskeletal: She exhibits deformity.  Contracted  Neurological: She is alert.  Not asked orientation questions  Skin: Skin is warm and dry.  Stage II decubitus  Psychiatric:  Smiles at times, able to answer simple questions  Nursing note and vitals reviewed.   Vital Signs: BP 126/63   Pulse 99   Temp 98.4 F (36.9 C) (Oral)   Resp 16   Ht 5\' 3"  (1.6 m)   Wt 48 kg   SpO2 100%   BMI 18.75 kg/m  Pain Scale: PAINAD   Pain Score: 0-No pain   SpO2: SpO2: 100 % O2 Device:SpO2: 100 % O2 Flow Rate: .O2 Flow Rate (L/min): 2 L/min  IO: Intake/output summary:   Intake/Output Summary (Last 24 hours) at 07/01/2018 1421 Last data filed at 07/01/2018 1300 Gross per 24 hour  Intake 1202.66 ml  Output 1800 ml  Net -597.34 ml    LBM: Last BM Date: 06/30/18 Baseline Weight: Weight: 46.2 kg Most recent weight: Weight: 48 kg     Palliative Assessment/Data:   Flowsheet Rows     Most Recent Value  Intake Tab  Referral Department  Hospitalist  Unit at Time of Referral  Cardiac/Telemetry Unit  Palliative Care Primary Diagnosis  Sepsis/Infectious Disease  Date Notified  06/30/18  Palliative Care Type  New Palliative care  Reason for referral  Clarify Goals of Care, Advance Care Planning  Date of Admission  06/29/18  Date first seen by Palliative Care  07/01/18  # of days Palliative referral response time  1 Day(s)  #  of days IP prior to Palliative referral  1  Clinical Assessment  Palliative Performance Scale Score  30%  Pain Max last 24 hours  Not able to report  Pain Min Last 24 hours  Not able to report  Dyspnea Max Last 24 Hours  Not able to report  Dyspnea Min Last 24 hours  Not able to report  Psychosocial & Spiritual Assessment  Palliative Care Outcomes  Patient/Family meeting held?  Yes  Who was at the meeting?  daughters at [place] goals of care, Provided advance care planning      Time In: 1400 Time Out: 1450 Time Total: 50 minutes Greater than 50%  of this time was spent counseling and coordinating care related to the above assessment and plan.  Signed by: Drue Novel, NP   Please contact Palliative Medicine Team phone at [phone] for questions and concerns.  For individual provider: See Shea Evans

## 2018-07-01 NOTE — Progress Notes (Signed)
UNMATCHED BLOOD PRODUCT NOTE  Compare the patient ID on the blood tag to the patient ID on the hospital armband and Blood Bank armband. Then confirm the unit number on the blood tag matches the unit number on the blood product.  If a discrepancy is discovered return the product to blood bank immediately.   Blood Product Type: O Positive PRBCs  Unit #: [id number]  Product Code #: [id number]   Start Time: 0138  Starting Rate: 120 ml/hr  Rate increase/decreased  (if applicable):      ml/hr  Rate changed time (if applicable):    Stop Time: 0430   All Other Documentation should be documented within the Blood Admin Flowsheet per policy.  'Verified by Rosary Lively, RN

## 2018-07-01 NOTE — Progress Notes (Signed)
This note also relates to the following rows which could not be included: Line - Cannot attach notes to extension rows  UNMATCHED BLOOD PRODUCT NOTE  Compare the patient ID on the blood tag to the patient ID on the hospital armband and Blood Bank armband. Then confirm the unit number on the blood tag matches the unit number on the blood product.  If a discrepancy is discovered return the product to blood bank immediately.   Blood Product Type: O Positive  Unit #: [id number]  Product Code #:  I0973Z32   Start Time: 9924  Starting Rate: 120 ml/hr  Rate increase/decreased  (if applicable):      ml/hr  Rate changed time (if applicable):    Stop Time: unknown stop time at this point, infusion began by Probation officer but will end by different RN.   All Other Documentation should be documented within the Blood Admin Flowsheet per policy.  Verified by Rosary Lively, RN

## 2018-07-01 NOTE — Evaluation (Signed)
Clinical/Bedside Swallow Evaluation Patient Details  Name: [name] MRN: [id number] Date of Birth: 1926-04-13  Today's Date: 07/01/2018 Time: SLP Start Time (ACUTE ONLY): 1330 SLP Stop Time (ACUTE ONLY): 1400 SLP Time Calculation (min) (ACUTE ONLY): 30 min  Past Medical History:  Past Medical History:  Diagnosis Date  . Acid reflux   . Anemia   . Arthritis    ra  . Asthma   . Cancer [facility])    breast cancer  . Coronary artery disease   . Diabetes mellitus without complication ([place])   . Diverticulitis   . Gastric ulcer   . Hyperlipidemia   . Hypertension   . Hypoxemia   . Kyphosis   . Kyphosis   . Mitral valve disorder   . Osteoporosis   . Oxygen dependent    2 liters at night  . Raynaud disease   . Thrombocytopenia ([place])    Past Surgical History:  Past Surgical History:  Procedure Laterality Date  . BREAST SURGERY    . HERNIA REPAIR    . MASTECTOMY Right 1978  . SIMPLE MASTECTOMY WITH AXILLARY SENTINEL NODE BIOPSY Left 05/09/2015   Procedure: LEFT SIMPLE MASTECTOMY;  Surgeon: Erroll Luna, MD;  Location: [facility];  Service: General;  Laterality: Left;   HPI:  [name]  is a 84 y.o. female, history of rheumatoid arthritis, breast cancer, CAD, diabetes mellitus type 2, hypertension, hyperlipidemia, who was brought to hospital due to elevated blood glucose and increased lethargy at home.  Patient has multiple admissions for the same in the past. Patient is bedbound with sacral decubitus ulcers. In the ED, patient was found to be have a temperature 100.3, tachycardic, lactic acid elevated at 4.02.  Pt was started on empiric antibiotics, UA positive for infection, Chest x-ray shows no infiltrate. Pt admitted for further management. On 06/29/18 pt AB was de-escalated to Ciprofloxacin. BSE requested and Pt known to SLP service from previous admissions.    Assessment / Plan / Recommendation Clinical Impression  Pt seen at bedside for clinical swallow evaluation. Pt is known  to SLP service from previous admissions. She is cared for in her home by her daughters and previously they requested regular textures and pureed meats. Pt received reclined in bed and was coughing upon SLP arrival. Her daughter had given her a cough drop- Pt repositioned and cued to cough and she expectorated a significant amount of mucous. SLP encouraged her daughter to use caution with cough drops when Pt is reclined in bed. Pt appears grossly contracted with her head turned sharply to her left. Oral motor examination reveals WFL strength and ROM with oral structures. Pt intake was limited, but did accept cup and straw sips of thin water, applesauce, and mech soft textures with moderate verbal cues to facilitate oral swallow due to oral holding. Pt is at risk for aspiration given advanced age, cognitive deficits, dependent feeder status, decreased mobility, and decreased respiratory status.  Her daughter asks if her mother could receive a PEG tube to help with hydration. They have been syringe feeding her liquids at home. SLP encouraged family to speak with palliative care team to help determine goals of care and understand end of life progression. Pt's daughter reports that Pt has been holding foods in her mouth much longer for the past few months and they administer syringes of liquids to help trigger swallow. SLP encouraged dtr to only place syringe to center of lips and allow Pt to close her lips around and slowly give  her small amounts. SLP further explained the risks of syringe feeding (reduced control, placement posteriorly, and aspiration risk).   Recommend D2/chopped with thin liquids when Pt is alert and as upright as possible (head positioning is difficult) with aspiration precautions. SLP discussed MBSS with Pt's daughter, however suspect positioning will be extremely difficult due to contractures. SLP will follow x1 during acute stay for diet tolerance and pt/family education.    SLP Visit  Diagnosis: Dysphagia, oropharyngeal phase (R13.12)    Aspiration Risk  Moderate aspiration risk;Risk for inadequate nutrition/hydration    Diet Recommendation Dysphagia 2 (Fine chop);Thin liquid   Liquid Administration via: Cup;Straw Medication Administration: Whole meds with puree Supervision: Staff to assist with self feeding;Full supervision/cueing for compensatory strategies Compensations: Slow rate;Small sips/bites(will need verbal prompts to swallow) Postural Changes: Seated upright at 90 degrees;Remain upright for at least 30 minutes after po intake    Other  Recommendations Recommended Consults: Other (Comment)(Palliative care consult) Oral Care Recommendations: Oral care before and after PO;Staff/trained caregiver to provide oral care Other Recommendations: Clarify dietary restrictions   Follow up Recommendations 24 hour supervision/assistance      Frequency and Duration min 2x/week  1 week       Prognosis Prognosis for Safe Diet Advancement: Guarded Barriers to Reach Goals: Cognitive deficits;Severity of deficits      Swallow Study   General Date of Onset: 06/29/18 HPI: [name]  is a 84 y.o. female, history of rheumatoid arthritis, breast cancer, CAD, diabetes mellitus type 2, hypertension, hyperlipidemia, who was brought to hospital due to elevated blood glucose and increased lethargy at home.  Patient has multiple admissions for the same in the past. Patient is bedbound with sacral decubitus ulcers. In the ED, patient was found to be have a temperature 100.3, tachycardic, lactic acid elevated at 4.02.  Pt was started on empiric antibiotics, UA positive for infection, Chest x-ray shows no infiltrate. Pt admitted for further management. On 06/29/18 pt AB was de-escalated to Ciprofloxacin. BSE requested and Pt known to SLP service from previous admissions.  Type of Study: Bedside Swallow Evaluation Previous Swallow Assessment: June 2019 BSE Diet Prior to this Study:  Regular;Thin liquids Temperature Spikes Noted: No Respiratory Status: Nasal cannula History of Recent Intubation: No Behavior/Cognition: Alert;Cooperative;Requires cueing Oral Cavity Assessment: Within Functional Limits Oral Care Completed by SLP: Yes Oral Cavity - Dentition: Edentulous Vision: Impaired for self-feeding Self-Feeding Abilities: Total assist Patient Positioning: Postural control interferes with function Baseline Vocal Quality: Normal Volitional Cough: Strong Volitional Swallow: Able to elicit    Oral/Motor/Sensory Function Overall Oral Motor/Sensory Function: Within functional limits   Ice Chips Ice chips: Within functional limits Presentation: Spoon   Thin Liquid Thin Liquid: Impaired Presentation: Cup;Straw Oral Phase Functional Implications: Oral holding Pharyngeal  Phase Impairments: Suspected delayed Swallow;Cough - Delayed    Nectar Thick Nectar Thick Liquid: Not tested   Honey Thick Honey Thick Liquid: Not tested   Puree Puree: Within functional limits Presentation: Spoon   Solid     Solid: Impaired Presentation: Spoon Oral Phase Impairments: Impaired mastication Oral Phase Functional Implications: Oral holding;Prolonged oral transit     Thank you,  [name], Sabrina  Drianna Young 07/01/2018,2:25 PM

## 2018-07-02 ENCOUNTER — Encounter (HOSPITAL_COMMUNITY): Payer: Self-pay | Admitting: Radiology

## 2018-07-02 ENCOUNTER — Inpatient Hospital Stay (HOSPITAL_COMMUNITY): Payer: Medicare Other

## 2018-07-02 DIAGNOSIS — R1030 Lower abdominal pain, unspecified: Secondary | ICD-10-CM

## 2018-07-02 DIAGNOSIS — D649 Anemia, unspecified: Secondary | ICD-10-CM

## 2018-07-02 DIAGNOSIS — N390 Urinary tract infection, site not specified: Secondary | ICD-10-CM

## 2018-07-02 DIAGNOSIS — R109 Unspecified abdominal pain: Secondary | ICD-10-CM

## 2018-07-02 DIAGNOSIS — Z7189 Other specified counseling: Secondary | ICD-10-CM

## 2018-07-02 DIAGNOSIS — E1165 Type 2 diabetes mellitus with hyperglycemia: Secondary | ICD-10-CM

## 2018-07-02 LAB — CBC WITH DIFFERENTIAL/PLATELET
BASOS ABS: 0 10*3/uL (ref 0.0–0.1)
Basophils Relative: 0 %
EOS ABS: 0.2 10*3/uL (ref 0.0–0.7)
EOS PCT: 1 %
HCT: 36 % (ref 36.0–46.0)
Hemoglobin: 11.3 g/dL — ABNORMAL LOW (ref 12.0–15.0)
Lymphocytes Relative: 9 %
Lymphs Abs: 1.5 10*3/uL (ref 0.7–4.0)
MCH: 27.4 pg (ref 26.0–34.0)
MCHC: 31.4 g/dL (ref 30.0–36.0)
MCV: 87.4 fL (ref 78.0–100.0)
Monocytes Absolute: 0.6 10*3/uL (ref 0.1–1.0)
Monocytes Relative: 4 %
Neutro Abs: 14.5 10*3/uL — ABNORMAL HIGH (ref 1.7–7.7)
Neutrophils Relative %: 86 %
PLATELETS: 418 10*3/uL — AB (ref 150–400)
RBC: 4.12 MIL/uL (ref 3.87–5.11)
RDW: 14.9 % (ref 11.5–15.5)
WBC: 16.8 10*3/uL — AB (ref 4.0–10.5)

## 2018-07-02 LAB — GLUCOSE, CAPILLARY
GLUCOSE-CAPILLARY: 176 mg/dL — AB (ref 70–99)
Glucose-Capillary: 117 mg/dL — ABNORMAL HIGH (ref 70–99)
Glucose-Capillary: 152 mg/dL — ABNORMAL HIGH (ref 70–99)
Glucose-Capillary: 211 mg/dL — ABNORMAL HIGH (ref 70–99)
Glucose-Capillary: 214 mg/dL — ABNORMAL HIGH (ref 70–99)
Glucose-Capillary: 41 mg/dL — CL (ref 70–99)
Glucose-Capillary: 82 mg/dL (ref 70–99)

## 2018-07-02 LAB — BPAM RBC
BLOOD PRODUCT EXPIRATION DATE: 201910192359
Blood Product Expiration Date: 201910242359
ISSUE DATE / TIME: 201909260121
ISSUE DATE / TIME: 201909261111
UNIT TYPE AND RH: 5100
Unit Type and Rh: 5100

## 2018-07-02 LAB — TYPE AND SCREEN
ABO/RH(D): O POS
ANTIBODY SCREEN: POSITIVE
DAT, IgG: NEGATIVE
DONOR AG TYPE: NEGATIVE
DONOR AG TYPE: NEGATIVE
PT AG Type: NEGATIVE
Unit division: 0
Unit division: 0

## 2018-07-02 LAB — PROCALCITONIN: PROCALCITONIN: 0.14 ng/mL

## 2018-07-02 LAB — BASIC METABOLIC PANEL
ANION GAP: 6 (ref 5–15)
BUN: 13 mg/dL (ref 8–23)
CALCIUM: 7.8 mg/dL — AB (ref 8.9–10.3)
CO2: 24 mmol/L (ref 22–32)
Chloride: 106 mmol/L (ref 98–111)
Creatinine, Ser: 0.65 mg/dL (ref 0.44–1.00)
GFR calc Af Amer: >60 mL/min (ref >60)
Glucose, Bld: 114 mg/dL — ABNORMAL HIGH (ref 70–99)
POTASSIUM: 3.4 mmol/L — AB (ref 3.5–5.1)
SODIUM: 136 mmol/L (ref 135–145)

## 2018-07-02 MED ORDER — DEXTROSE 50 % IV SOLN
INTRAVENOUS | Status: AC
  Filled 2018-07-02: qty 50

## 2018-07-02 MED ORDER — DEXTROSE 50 % IV SOLN
1.0000 | Freq: Once | INTRAVENOUS | Status: AC
  Administered 2018-07-02: 50 mL via INTRAVENOUS

## 2018-07-02 MED ORDER — SODIUM CHLORIDE 0.9 % IV SOLN
INTRAVENOUS | Status: DC | PRN
  Administered 2018-07-02 – 2018-07-04 (×3): 250 mL via INTRAVENOUS

## 2018-07-02 MED ORDER — BISACODYL 10 MG RE SUPP
10.0000 mg | Freq: Once | RECTAL | Status: AC
  Administered 2018-07-02: 10 mg via RECTAL
  Filled 2018-07-02: qty 1

## 2018-07-02 NOTE — Care Management Important Message (Signed)
Important Message  Patient Details  Name: [name] MRN: [id number] Date of Birth: 16-Jun-1926   Medicare Important Message Given:  Yes    Sherald Barge, RN 07/02/2018, 11:37 AM

## 2018-07-02 NOTE — Significant Event (Signed)
Received notification from telemetry pt HR 160.  Pt currently receiving breathing treatment with pt laughing and coughing, RRT at bedside.  Pt HR returned to 118-120 ST with occasional unifocal PVC, post treatment, will continue to monitor.

## 2018-07-02 NOTE — Progress Notes (Signed)
Palliative: [name] is resting quietly in bed.  She will wake easily, briefly make but not keep eye contact.  She is able to answer simple yes and no questions.  Present today at bedside is eldest daughter, [name], [name].  We talked about improvement in white blood cells, continued with IV antibiotics.  We talked about colonization, recurrent UTIs. We talked about CODE STATUS in detail.  [name] tells me that she and the family are leaning toward "allow a natural death".  [name] states that she feels her mother has suffered enough, and agrees that she has done the work she came here to do.  [name] tells me she needs further discussions with her sisters before accepting DNR status.  We talked about the benefits of in-home hospice.  I shared that this is a free service for in-home, treat the treatable care.  [name] asks pertinent questions.  I share that they can have a self-referral/MD referral at a later date, hospice can come to the home tell them what they can and cannot do, and family can decide whether the services would be beneficial.  Booklets "gone from my sight", and "hard choices for loving people" given to nursing staff to deliver to family. Conference with nursing staff related to patient needs. Conference with hospitalist.  80 minutes Sabrina Axe, NP Palliative Medicine Team Team Phone # [phone]  Greater than 50% of this time was spent counseling and coordinating care related to the above assessment and plan.

## 2018-07-02 NOTE — Progress Notes (Signed)
PROGRESS NOTE    [name]  HEN:[id number] DOB: Jun 06, 1926 DOA: 06/29/2018 PCP: Moshe Cipro, MD    Brief Narrative:  [name] a89 y.o.female,history of rheumatoid arthritis, breast cancer, CAD, diabetes mellitus type 2, hypertension, hyperlipidemia,bed-bound who was brought to hospital due to elevated blood glucose and increased lethargy at home. Patient has multiple admissions for the same in the past. Patient is bedbound with sacral decubitus ulcers. In the ED, patient was found to be have a temperature 100.3, tachycardic, lactic acid elevated at 4.02.  Pt was started on empiric antibiotics, UA positive for infection, Chest x-ray shows no infiltrate. Pt admitted for further management. On 06/29/18 pt AB was de-escalated to Ciprofloxacin.   Assessment & Plan:   Principal Problem:   Sepsis secondary to UTI [facility]) Active Problems:   Abdominal pain   Sepsis ([place])   Goals of care, counseling/discussion   Palliative care by specialist   DNR (do not resuscitate) discussion   Encounter for hospice care discussion   Acute lower UTI   Type 2 diabetes mellitus with hyperglycemia ([place])  #1??  Sepsis versus systemic inflammatory response syndrome likely secondary to UTI. Concern for infection from probable UTI.  Urinalysis on admission with large leukocytes, WBC clumps, greater than 50 WBCs.  Patient also noted to have a leukocytosis on admission with a white count of 20.3 which is trending down and currently at 16.8.  Chest x-ray done on admission negative for any acute infiltrates.  Patient currently afebrile.  Blood cultures pending with no growth to date.  Urine cultures were negative.  MRSA PCR was negative.  Patient now on empiric IV aztreonam and will likely treat empirically for total of 5 days.  Continue supportive care.  Follow.  2.  Lower abdominal pain Patient with complaints of lower abdominal pain with tenderness to palpation in the suprapubic and lower abdominal  region.  Concern for constipation versus urinary retention.  Check a KUB to rule out obstruction.  Check a bladder scan.  Soapsuds enema x1.  I and O catheter x1.  Dulcolax suppository.  Supportive care.  Follow.  3.  Probable UTI Continue empiric IV antibiotics and treat for total of 5 days.  4.  Diabetes mellitus type 2 Hemoglobin A1c 9.2 on 06/29/2018.  CBGs ranging from 41-214 today.  Discontinue Lantus.  Continue sliding scale insulin.  Follow.  5.  SVT Patient noted to have SVT in the 150s on admission.  TSH and free T4 within normal limits.  SVT improved.  Status post IV Lopressor.  Continue current regimen of Cardizem.  Monitor blood pressure closely.  6.  Normocytic anemia/anemia of chronic disease Patient with no signs of bleeding.  Status post 2 units packed red blood cells.  Hemoglobin currently at 11.3 from 7.1.  Iron on admission was 9, TIBC of 133, folate of 43 and ferritin of 136.  Continue oral iron supplementation.  Follow H&H.  7.  Hypertension BP stable.  Continue Cardizem.  8.  Multiple decubitus ulcer stage III on sacrum/bilateral buttock/upper back/posterior shoulder Continue current wound care recommendations as per wound care nurse.  Outpatient follow-up with the wound care center on discharge.   DVT prophylaxis: Lovenox Code Status: Full Family Communication: Updated daughter at bedside. Disposition Plan: Home with home health when clinically improved and back at baseline hopefully in the next 2 to 3 days.   Consultants:   Palliative care Quinn Axe, NP 07/01/2018  Wound care  Procedures:   Chest x-ray 06/29/2018  Abdominal films pending  07/02/2018  Antimicrobials:   IV aztreonam 06/29/2018  IV ciprofloxacin 06/29/2018>>>> 06/30/2018  IV Flagyl 924 20191 dose  IV vancomycin 06/29/2018>>>> 06/30/2018   Subjective: Patient drowsy however arousable to daughter's voice.  Patient denies any chest pain or shortness of breath.  Patient with complaints of  lower abdominal pain.  Objective: Vitals:   07/01/18 2112 07/02/18 0544 07/02/18 0727 07/02/18 1336  BP: 136/79 136/69  (!) 118/59  Pulse: (!) 103 (!) 107  (!) 110  Resp:    16  Temp: 98.4 F (36.9 C) 98.6 F (37 C)  98.6 F (37 C)  TempSrc: Oral Oral  Oral  SpO2: 100% 100% 99% 99%  Weight:      Height:        Intake/Output Summary (Last 24 hours) at 07/02/2018 1808 Last data filed at 07/02/2018 1309 Gross per 24 hour  Intake 480 ml  Output -  Net 480 ml   Filed Weights   06/29/18 0055 06/29/18 0456 06/29/18 2356  Weight: 46.2 kg 47.8 kg 48 kg    Examination:  General exam: Drowsy.  Lethargic. Respiratory system: Clear to auscultation anterior lung fields. Respiratory effort normal. Cardiovascular system: S1 & S2 heard, RRR. No JVD, murmurs, rubs, gallops or clicks. No pedal edema. Gastrointestinal system: Abdomen is nondistended, soft and tender to palpation in the lower abdominal region and suprapubic region.  Normal bowel sounds.  Central nervous system: Alert and oriented. No focal neurological deficits. Extremities: Symmetric 5 x 5 power. Skin: Multiple decubitus ulcers stage III on sacrum/bilateral buttock/upper back/posterior shoulder.  Psychiatry: Judgement and insight appear fair. Mood & affect appropriate.     Data Reviewed: I have personally reviewed following labs and imaging studies  CBC: Recent Labs  Lab 06/29/18 0030 06/29/18 0614 06/30/18 0534 07/01/18 1045 07/02/18 0530  WBC 17.6* 12.0* 18.6* 20.3* 16.8*  NEUTROABS 14.2*  --   --  18.4* 14.5*  HGB 8.1* 7.6* 7.1* 10.7* 11.3*  HCT 27.0* 25.5* 23.5* 33.1* 36.0  MCV 87.4 87.0 86.7 87.3 87.4  PLT 482* 433* 449* 405* 702*   Basic Metabolic Panel: Recent Labs  Lab 06/29/18 0030 06/29/18 0614 06/30/18 0534 07/01/18 1045 07/02/18 0530  NA 133* 137 135 136 136  K 4.8 4.3 3.8 3.9 3.4*  CL 97* 104 104 108 106  CO2 26 25 24 23 24   GLUCOSE 365* 295* 213* 183* 114*  BUN 32* 24* 17 14 13     CREATININE 1.09* 0.91 0.79 0.64 0.65  CALCIUM 9.1 8.1* 7.7* 7.6* 7.8*   GFR: Estimated Creatinine Clearance: 35.4 mL/min (by C-G formula based on SCr of 0.65 mg/dL). Liver Function Tests: Recent Labs  Lab 06/29/18 0030 06/29/18 0614  AST 16 13*  ALT 10 10  ALKPHOS 82 71  BILITOT 0.2* 0.5  PROT 7.0 6.1*  ALBUMIN 2.6* 2.1*   No results for input(s): LIPASE, AMYLASE in the last 168 hours. No results for input(s): AMMONIA in the last 168 hours. Coagulation Profile: No results for input(s): INR, PROTIME in the last 168 hours. Cardiac Enzymes: No results for input(s): CKTOTAL, CKMB, CKMBINDEX, TROPONINI in the last 168 hours. BNP (last 3 results) No results for input(s): PROBNP in the last 8760 hours. HbA1C: No results for input(s): HGBA1C in the last 72 hours. CBG: Recent Labs  Lab 07/02/18 0357 07/02/18 0425 07/02/18 0754 07/02/18 1126 07/02/18 1626  GLUCAP 41* 152* 82 214* 117*   Lipid Profile: No results for input(s): CHOL, HDL, LDLCALC, TRIG, CHOLHDL, LDLDIRECT in the last 72  hours. Thyroid Function Tests: Recent Labs    06/30/18 1452  TSH 1.670  FREET4 1.37   Anemia Panel: Recent Labs    06/30/18 1452  VITAMINB12 889  FOLATE 43.0  FERRITIN 136  TIBC 133*  IRON 9*   Sepsis Labs: Recent Labs  Lab 06/29/18 0040 06/29/18 0325 06/30/18 1452 07/01/18 1045 07/02/18 0530  PROCALCITON  --   --  0.24 0.16 0.14  LATICACIDVEN 3.87* 4.02* 1.4  --   --     Recent Results (from the past 240 hour(s))  Blood Culture (routine x 2)     Status: None (Preliminary result)   Collection Time: 06/29/18 12:30 AM  Result Value Ref Range Status   Specimen Description BLOOD RIGHT HAND  Final   Special Requests   Final    BOTTLES DRAWN AEROBIC AND ANAEROBIC Blood Culture results may not be optimal due to an inadequate volume of blood received in culture bottles   Culture   Final    NO GROWTH 3 DAYS Performed at [facility], [address], [place], [place] [address]     Report Status PENDING  Incomplete  Blood Culture (routine x 2)     Status: None (Preliminary result)   Collection Time: 06/29/18 12:45 AM  Result Value Ref Range Status   Specimen Description BLOOD RIGHT ARM  Final   Special Requests   Final    BOTTLES DRAWN AEROBIC AND ANAEROBIC Blood Culture results may not be optimal due to an inadequate volume of blood received in culture bottles   Culture   Final    NO GROWTH 3 DAYS Performed at [facility], [address], [place], [place] [address]    Report Status PENDING  Incomplete  Culture, Urine     Status: None   Collection Time: 06/29/18  4:21 AM  Result Value Ref Range Status   Specimen Description   Final    URINE, RANDOM Performed at [facility], [address] [place], [place] [address]    Special Requests   Final    NONE Performed at [facility], [address] [place], [place] [address]    Culture   Final    NO GROWTH Performed at [facility], [place] [address], [place], [place] [address]    Report Status 06/30/2018 FINAL  Final  MRSA PCR Screening     Status: None   Collection Time: 06/29/18  4:37 AM  Result Value Ref Range Status   MRSA by PCR NEGATIVE NEGATIVE Final    Comment:        The GeneXpert MRSA Assay (FDA approved for NASAL specimens only), is one component of a comprehensive MRSA colonization surveillance program. It is not intended to diagnose MRSA infection nor to guide or monitor treatment for MRSA infections. Performed at [facility], [address] [place],  [address]          Radiology Studies: Dg Abd 1 View  Result Date: 07/02/2018 CLINICAL DATA:  Generalized abdominal pain. EXAM: ABDOMEN - 1 VIEW COMPARISON:  Radiographs of Feb 11, 2018. FINDINGS: The bowel gas pattern is normal. Stool is noted throughout the colon. Atherosclerosis of abdominal aorta is noted. No radio-opaque calculi or other significant radiographic abnormality are seen. IMPRESSION: No evidence of bowel  obstruction or ileus. Aortic Atherosclerosis (ICD10-I70.0). Electronically Signed   By: Marijo Conception, M.D.   On: 07/02/2018 15:16        Scheduled Meds: . aspirin EC  81 mg Oral BID  .  atorvastatin  10 mg Oral QPM  . bisacodyl  10 mg Rectal Once  . cholecalciferol  2,000 Units Oral Daily  . diltiazem  120 mg Oral Daily  . enoxaparin (LOVENOX) injection  40 mg Subcutaneous Q24H  . ferrous [id number] C-folic acid  1 capsule Oral Daily  . insulin aspart  0-5 Units Subcutaneous QHS  . insulin aspart  0-9 Units Subcutaneous Q4H  . levalbuterol  0.63 mg Nebulization BID  . mouth rinse  15 mL Mouth Rinse BID  . mometasone-formoterol  2 puff Inhalation BID  . pantoprazole  40 mg Oral Daily  . predniSONE  5 mg Oral Q breakfast   Continuous Infusions: . aztreonam 1 g (07/02/18 1332)     LOS: 3 days    Time spent: 35 minutes    Irine Seal, MD [facility] Pager [phone]  If 7PM-7AM, please contact night-coverage [email or web address] Password [facility] 07/02/2018, 6:08 PM

## 2018-07-02 NOTE — Progress Notes (Signed)
Hypoglycemic Event  CBG: 41 Treatment: D50 IV 50 mL  Symptoms: None  Follow-up CBG: Time:15 min CBG Result:152  Possible Reasons for Event: Medication regimen:    Comments/MD notified: E. Duwaine Maxin

## 2018-07-03 LAB — BASIC METABOLIC PANEL
Anion gap: 8 (ref 5–15)
BUN: 12 mg/dL (ref 8–23)
CALCIUM: 8 mg/dL — AB (ref 8.9–10.3)
CO2: 24 mmol/L (ref 22–32)
CREATININE: 0.73 mg/dL (ref 0.44–1.00)
Chloride: 106 mmol/L (ref 98–111)
GFR calc Af Amer: >60 mL/min (ref >60)
GFR calc non Af Amer: >60 mL/min (ref >60)
GLUCOSE: 132 mg/dL — AB (ref 70–99)
Potassium: 3.5 mmol/L (ref 3.5–5.1)
Sodium: 138 mmol/L (ref 135–145)

## 2018-07-03 LAB — CBC WITH DIFFERENTIAL/PLATELET
Basophils Absolute: 0 10*3/uL (ref 0.0–0.1)
Basophils Relative: 0 %
Eosinophils Absolute: 0.1 10*3/uL (ref 0.0–0.7)
Eosinophils Relative: 1 %
HEMATOCRIT: 38.4 % (ref 36.0–46.0)
Hemoglobin: 12.1 g/dL (ref 12.0–15.0)
LYMPHS ABS: 1.9 10*3/uL (ref 0.7–4.0)
Lymphocytes Relative: 12 %
MCH: 27.6 pg (ref 26.0–34.0)
MCHC: 31.5 g/dL (ref 30.0–36.0)
MCV: 87.7 fL (ref 78.0–100.0)
MONO ABS: 0.8 10*3/uL (ref 0.1–1.0)
Monocytes Relative: 5 %
NEUTROS ABS: 13.6 10*3/uL — AB (ref 1.7–7.7)
Neutrophils Relative %: 82 %
Platelets: 393 10*3/uL (ref 150–400)
RBC: 4.38 MIL/uL (ref 3.87–5.11)
RDW: 15.5 % (ref 11.5–15.5)
WBC: 16.3 10*3/uL — ABNORMAL HIGH (ref 4.0–10.5)

## 2018-07-03 LAB — GLUCOSE, CAPILLARY
GLUCOSE-CAPILLARY: 121 mg/dL — AB (ref 70–99)
GLUCOSE-CAPILLARY: 118 mg/dL — AB (ref 70–99)
GLUCOSE-CAPILLARY: 260 mg/dL — AB (ref 70–99)
Glucose-Capillary: 111 mg/dL — ABNORMAL HIGH (ref 70–99)
Glucose-Capillary: 146 mg/dL — ABNORMAL HIGH (ref 70–99)
Glucose-Capillary: 147 mg/dL — ABNORMAL HIGH (ref 70–99)
Glucose-Capillary: 239 mg/dL — ABNORMAL HIGH (ref 70–99)

## 2018-07-03 LAB — MAGNESIUM: Magnesium: 1.6 mg/dL — ABNORMAL LOW (ref 1.7–2.4)

## 2018-07-03 NOTE — Progress Notes (Signed)
PROGRESS NOTE  [name] XMI:[id number] DOB: 22-Feb-1926 DOA: 06/29/2018 PCP: Moshe Cipro, MD  HPI/Recap of past 24 hours: Patient seen at bedside family in the bed in the room.  Her eyes are closed and her head is held in a fixed position to the left bent to the left.  Family reported that she ate well and slept well overnight.  They also take care of her at home they have a home health nurse.  They were concerned about the swelling of her right arm at the site of previous IV site which has been removed.  We are reassured that it swelling is just from the IV and that has been removed and elevated and need to come down.  Assessment/Plan: Principal Problem:   Sepsis secondary to UTI [facility]) Active Problems:   Pressure ulcer   Sepsis ([place])   Goals of care, counseling/discussion   Palliative care by specialist   DNR (do not resuscitate) discussion   Encounter for hospice care discussion   Abdominal pain   Acute lower UTI   Type 2 diabetes mellitus with hyperglycemia ([place])  1.  Sepsis secondary to urinary tract infection initial WBC was greater than 50,000 on admission correctly is trending down WBC is 16.3 she we will continue on IV as aztreonam MRSA PCR was negative, urine culture was negative growth after 4 days also blood culture is pending but is no growth after 4 days 2.  Multiple decubitus ulcer wound care consult has been done we will continue to follow recommendation. 3.  Diabetes mellitus hemoglobin A1c 9.2 on September 24 we will continue sliding scale. 4.  Normocytic anemia patient is status post 2 units packed RBC her hemoglobin is up to 11 this morning. 5.  Hypertension stable continue Cardizem  Code Status: Full code  Family Communication: 2 daughters in the room  Disposition Plan: Home   Consultants:  Wound care  Palliative care consult July 01, 2018  Procedures:  Wound  care  Antimicrobials:  Aztreonam  Ciprofloxacin  Flagyl  Vancomycin  DVT prophylaxis: Lovenox   Objective: Vitals:   07/03/18 0512 07/03/18 1036  BP: (!) 115/53   Pulse: (!) 111   Resp: (!) 22   Temp: 98.3 F (36.8 C)   SpO2: 96% 94%    Intake/Output Summary (Last 24 hours) at 07/03/2018 1639 Last data filed at 07/03/2018 1222 Gross per 24 hour  Intake 740.66 ml  Output 600 ml  Net 140.66 ml   Filed Weights   06/29/18 0055 06/29/18 0456 06/29/18 2356  Weight: 46.2 kg 47.8 kg 48 kg   Body mass index is 18.75 kg/m.  Exam:   General: Chronically ill looking  Cardiovascular: Heart rate regular rate and rhythm with no murmur, no JVD  Respiratory: Clear to auscultation bilaterally effort was normal  Abdomen: Full soft nontender no him hepatosplenomegaly normoactive bowel sounds  Musculoskeletal: Her head is held in a fixed position to the left with contracture she moves other extremities.  There was swelling of the right upper extremity  Skin: Decubitus ulcers stage III  Psychiatry: Could not be assessed patient was sleeping or her eyes closed all the time she did not communicate with me   Data Reviewed: CBC: Recent Labs  Lab 06/29/18 0030 06/29/18 0614 06/30/18 0534 07/01/18 1045 07/02/18 0530 07/03/18 0710  WBC 17.6* 12.0* 18.6* 20.3* 16.8* 16.3*  NEUTROABS 14.2*  --   --  18.4* 14.5* 13.6*  HGB 8.1* 7.6* 7.1* 10.7* 11.3* 12.1  HCT 27.0*  25.5* 23.5* 33.1* 36.0 38.4  MCV 87.4 87.0 86.7 87.3 87.4 87.7  PLT 482* 433* 449* 405* 418* 094   Basic Metabolic Panel: Recent Labs  Lab 06/29/18 0614 06/30/18 0534 07/01/18 1045 07/02/18 0530 07/03/18 0710  NA 137 135 136 136 138  K 4.3 3.8 3.9 3.4* 3.5  CL 104 104 108 106 106  CO2 25 24 23 24 24   GLUCOSE 295* 213* 183* 114* 132*  BUN 24* 17 14 13 12   CREATININE 0.91 0.79 0.64 0.65 0.73  CALCIUM 8.1* 7.7* 7.6* 7.8* 8.0*  MG  --   --   --   --  1.6*   GFR: Estimated Creatinine Clearance: 35.4  mL/min (by C-G formula based on SCr of 0.73 mg/dL). Liver Function Tests: Recent Labs  Lab 06/29/18 0030 06/29/18 0614  AST 16 13*  ALT 10 10  ALKPHOS 82 71  BILITOT 0.2* 0.5  PROT 7.0 6.1*  ALBUMIN 2.6* 2.1*   No results for input(s): LIPASE, AMYLASE in the last 168 hours. No results for input(s): AMMONIA in the last 168 hours. Coagulation Profile: No results for input(s): INR, PROTIME in the last 168 hours. Cardiac Enzymes: No results for input(s): CKTOTAL, CKMB, CKMBINDEX, TROPONINI in the last 168 hours. BNP (last 3 results) No results for input(s): PROBNP in the last 8760 hours. HbA1C: No results for input(s): HGBA1C in the last 72 hours. CBG: Recent Labs  Lab 07/03/18 0009 07/03/18 0506 07/03/18 0738 07/03/18 1114 07/03/18 1628  GLUCAP 146* 121* 118* 260* 147*   Lipid Profile: No results for input(s): CHOL, HDL, LDLCALC, TRIG, CHOLHDL, LDLDIRECT in the last 72 hours. Thyroid Function Tests: No results for input(s): TSH, T4TOTAL, FREET4, T3FREE, THYROIDAB in the last 72 hours. Anemia Panel: No results for input(s): VITAMINB12, FOLATE, FERRITIN, TIBC, IRON, RETICCTPCT in the last 72 hours. Urine analysis:    Component Value Date/Time   COLORURINE YELLOW 06/29/2018 0330   APPEARANCEUR CLOUDY (A) 06/29/2018 0330   LABSPEC 1.014 06/29/2018 0330   PHURINE 5.0 06/29/2018 0330   GLUCOSEU >=500 (A) 06/29/2018 0330   HGBUR SMALL (A) 06/29/2018 0330   BILIRUBINUR NEGATIVE 06/29/2018 0330   KETONESUR NEGATIVE 06/29/2018 0330   PROTEINUR 100 (A) 06/29/2018 0330   UROBILINOGEN 0.2 06/30/2015 1317   NITRITE NEGATIVE 06/29/2018 0330   LEUKOCYTESUR LARGE (A) 06/29/2018 0330   Sepsis Labs: @LABRCNTIP (procalcitonin:4,lacticidven:4)  ) Recent Results (from the past 240 hour(s))  Blood Culture (routine x 2)     Status: None (Preliminary result)   Collection Time: 06/29/18 12:30 AM  Result Value Ref Range Status   Specimen Description BLOOD RIGHT HAND  Final   Special  Requests   Final    BOTTLES DRAWN AEROBIC AND ANAEROBIC Blood Culture results may not be optimal due to an inadequate volume of blood received in culture bottles   Culture   Final    NO GROWTH 4 DAYS Performed at [facility], [address], [place], [place] [address]    Report Status PENDING  Incomplete  Blood Culture (routine x 2)     Status: None (Preliminary result)   Collection Time: 06/29/18 12:45 AM  Result Value Ref Range Status   Specimen Description BLOOD RIGHT ARM  Final   Special Requests   Final    BOTTLES DRAWN AEROBIC AND ANAEROBIC Blood Culture results may not be optimal due to an inadequate volume of blood received in culture bottles   Culture   Final    NO GROWTH 4 DAYS Performed at  [facility], [address], [place], [place] [address]    Report Status PENDING  Incomplete  Culture, Urine     Status: None   Collection Time: 06/29/18  4:21 AM  Result Value Ref Range Status   Specimen Description   Final    URINE, RANDOM Performed at [facility], [address] [place], [place] [address]    Special Requests   Final    NONE Performed at [facility], [address], [place], [place] [address]    Culture   Final    NO GROWTH Performed at [facility], [place] [address] [place], [place] [address]    Report Status 06/30/2018 FINAL  Final  MRSA PCR Screening     Status: None   Collection Time: 06/29/18  4:37 AM  Result Value Ref Range Status   MRSA by PCR NEGATIVE NEGATIVE Final    Comment:        The GeneXpert MRSA Assay (FDA approved for NASAL specimens only), is one component of a comprehensive MRSA colonization surveillance program. It is not intended to diagnose MRSA infection nor to guide or monitor treatment for MRSA infections. Performed at [facility], [address] [place], [place] [address]       Studies: No results found.  Scheduled Meds: . aspirin EC  81 mg Oral BID  . atorvastatin  10 mg Oral QPM  . cholecalciferol  2,000  Units Oral Daily  . diltiazem  120 mg Oral Daily  . enoxaparin (LOVENOX) injection  40 mg Subcutaneous Q24H  . ferrous [id number] C-folic acid  1 capsule Oral Daily  . insulin aspart  0-5 Units Subcutaneous QHS  . insulin aspart  0-9 Units Subcutaneous Q4H  . levalbuterol  0.63 mg Nebulization BID  . mouth rinse  15 mL Mouth Rinse BID  . mometasone-formoterol  2 puff Inhalation BID  . pantoprazole  40 mg Oral Daily  . predniSONE  5 mg Oral Q breakfast    Continuous Infusions: . sodium chloride 10 mL/hr at 07/03/18 0700  . aztreonam 1 g (07/03/18 1301)     LOS: 4 days     Cristal Deer, MD [facility]  To reach me or the doctor on call, go to: [email or web address] Password [facility]  07/03/2018, 4:39 PM

## 2018-07-03 NOTE — Progress Notes (Signed)
Pharmacy Antibiotic Note  [name] is a 84 y.o. female admitted on 06/29/2018 with UTI.  Pharmacy has been consulted for  aztreonam dosing.  Plan: Aztreonam then 1gm IV q8h  Patient has tolerated Zosyn/cefazolin in the past F/U cxs and clinical progress Monitor V/S, labs   Height: 5\' 3"  (160 cm) Weight: 105 lb 13.1 oz (48 kg) IBW/kg (Calculated) : 52.4  Temp (24hrs), Avg:98.5 F (36.9 C), Min:98.3 F (36.8 C), Max:98.6 F (37 C)  Recent Labs  Lab 06/29/18 0040 06/29/18 0325 06/29/18 0614 06/30/18 0534 06/30/18 1452 07/01/18 1045 07/02/18 0530 07/03/18 0710  WBC  --   --  12.0* 18.6*  --  20.3* 16.8* 16.3*  CREATININE  --   --  0.91 0.79  --  0.64 0.65 0.73  LATICACIDVEN 3.87* 4.02*  --   --  1.4  --   --   --     Estimated Creatinine Clearance: 35.4 mL/min (by C-G formula based on SCr of 0.73 mg/dL).    Allergies  Allergen Reactions  . Aspirin Other (See Comments)    Cannot take uncoated due to stomach ulcers  . Megace [Megestrol]     abd pain   . Rocephin [Ceftriaxone Sodium In Dextrose] Swelling    Antimicrobials this admission: Vancomycin 9/24>>9/24 Aztreonam  9/24>> 9/24 restarted 9/25>> Metronidazole 9/24>>  cipro 9/24>>9/2  Dose adjustments this admission: N/A  Microbiology results: 9/24 BCx: ngtd 9/24 UCx: ng 9/24 MRSA PCR: Neg  Thank you for allowing pharmacy to be a part of this patient's care.  Margot Ables, PharmD Clinical Pharmacist 07/03/2018 12:20 PM

## 2018-07-03 NOTE — Progress Notes (Signed)
Paged Mid-Level about patients glucose reading 239 and two active  insulin orders. [facility]-Level D/C bedtime insulin coverage.

## 2018-07-04 LAB — CULTURE, BLOOD (ROUTINE X 2)
CULTURE: NO GROWTH
Culture: NO GROWTH

## 2018-07-04 LAB — GLUCOSE, CAPILLARY
GLUCOSE-CAPILLARY: 165 mg/dL — AB (ref 70–99)
GLUCOSE-CAPILLARY: 256 mg/dL — AB (ref 70–99)
GLUCOSE-CAPILLARY: 92 mg/dL (ref 70–99)
Glucose-Capillary: 117 mg/dL — ABNORMAL HIGH (ref 70–99)
Glucose-Capillary: 327 mg/dL — ABNORMAL HIGH (ref 70–99)
Glucose-Capillary: 302 mg/dL — ABNORMAL HIGH (ref 70–99)

## 2018-07-04 LAB — CBC WITH DIFFERENTIAL/PLATELET
Basophils Absolute: 0 10*3/uL (ref 0.0–0.1)
Basophils Relative: 0 %
EOS ABS: 0.2 10*3/uL (ref 0.0–0.7)
EOS PCT: 1 %
HCT: 35.3 % — ABNORMAL LOW (ref 36.0–46.0)
Hemoglobin: 10.8 g/dL — ABNORMAL LOW (ref 12.0–15.0)
LYMPHS ABS: 1.6 10*3/uL (ref 0.7–4.0)
Lymphocytes Relative: 9 %
MCH: 27.7 pg (ref 26.0–34.0)
MCHC: 30.6 g/dL (ref 30.0–36.0)
MCV: 90.5 fL (ref 78.0–100.0)
Monocytes Absolute: 1 10*3/uL (ref 0.1–1.0)
Monocytes Relative: 6 %
Neutro Abs: 14.6 10*3/uL — ABNORMAL HIGH (ref 1.7–7.7)
Neutrophils Relative %: 84 %
PLATELETS: 405 10*3/uL — AB (ref 150–400)
RBC: 3.9 MIL/uL (ref 3.87–5.11)
RDW: 16.2 % — ABNORMAL HIGH (ref 11.5–15.5)
WBC: 17.3 10*3/uL — AB (ref 4.0–10.5)

## 2018-07-04 NOTE — Progress Notes (Signed)
PROGRESS NOTE  Campbell Agramonte RFF:[id number] DOB: 05/05/1926 DOA: 06/29/2018 PCP: Moshe Cipro, MD  HPI/Recap of past 24 hours: Patient seen at bedside family in the bed in the room.  Her eyes are closed and her head is held in a fixed position to the left bent to the left.  Family reported that she ate well and slept well overnight.  They also take care of her at home they have a home health nurse.  They were concerned about the swelling of her right arm at the site of previous IV site which has been removed.  We are reassured that it swelling is just from the IV and that has been removed and elevated and need to come down.  July 04, 2018. Subjective: Patient seen at bedside with family members in the room.  HER-2 daughters are in the room.  They stated that she slept and ate very well.  The swelling in her right arm is still there but little better.  Denies any fever or any other concern  Assessment/Plan: Principal Problem:   Sepsis secondary to UTI [facility]) Active Problems:   Pressure ulcer   Sepsis ([place])   Goals of care, counseling/discussion   Palliative care by specialist   DNR (do not resuscitate) discussion   Encounter for hospice care discussion   Abdominal pain   Acute lower UTI   Type 2 diabetes mellitus with hyperglycemia ([place])  1.  Sepsis secondary to urinary tract infection initial WBC was greater than 50,000 on admission correctly is trending down WBC is 16.3 she we will continue on IV as aztreonam MRSA PCR was negative, urine culture was negative growth after 4 days also blood culture is pending but is no growth after 4 days.  She will continue new current IV antibiotics probably need 1 or 2 more days of IV antibiotics before he can be switched to p.o. 2.  Multiple decubitus ulcer wound care consult has been done we will continue to follow recommendation. 3.  Diabetes mellitus hemoglobin A1c 9.2 on September 24 we will continue sliding scale. 4.  Normocytic anemia  patient is status post 2 units packed RBC her hemoglobin is up to 11 this morning. 5.  Hypertension stable continue Cardizem  Code Status: Full code  Family Communication: 2 daughters in the room  Disposition Plan: Home   Consultants:  Wound care  Palliative care consult July 01, 2018  Procedures:  Wound care  Antimicrobials:  Aztreonam  Ciprofloxacin  Flagyl  Vancomycin  DVT prophylaxis: Lovenox   Objective: Vitals:   07/04/18 0545 07/04/18 1411  BP: 122/65 137/67  Pulse: 95 (!) 116  Resp: 16 14  Temp: 98.4 F (36.9 C) 99.6 F (37.6 C)  SpO2: 100% 95%    Intake/Output Summary (Last 24 hours) at 07/04/2018 2037 Last data filed at 07/04/2018 1700 Gross per 24 hour  Intake 381.43 ml  Output -  Net 381.43 ml   Filed Weights   06/29/18 0055 06/29/18 0456 06/29/18 2356  Weight: 46.2 kg 47.8 kg 48 kg   Body mass index is 18.75 kg/m.  Exam:   General: Chronically ill looking  Cardiovascular: Heart rate regular rate and rhythm with no murmur, no JVD  Respiratory: Clear to auscultation bilaterally effort was normal  Abdomen: Full soft nontender no him hepatosplenomegaly normoactive bowel sounds  Musculoskeletal: Her head is held in a fixed position to the left with contracture she moves other extremities.  There was swelling of the right upper extremity  Skin:  Decubitus ulcers stage III  Psychiatry: Could not be assessed patient was sleeping or her eyes closed all the time she did not communicate with me   Data Reviewed: CBC: Recent Labs  Lab 06/29/18 0030  06/30/18 0534 07/01/18 1045 07/02/18 0530 07/03/18 0710 07/04/18 0642  WBC 17.6*   < > 18.6* 20.3* 16.8* 16.3* 17.3*  NEUTROABS 14.2*  --   --  18.4* 14.5* 13.6* 14.6*  HGB 8.1*   < > 7.1* 10.7* 11.3* 12.1 10.8*  HCT 27.0*   < > 23.5* 33.1* 36.0 38.4 35.3*  MCV 87.4   < > 86.7 87.3 87.4 87.7 90.5  PLT 482*   < > 449* 405* 418* 393 405*   < > = values in this interval not  displayed.   Basic Metabolic Panel: Recent Labs  Lab 06/29/18 0614 06/30/18 0534 07/01/18 1045 07/02/18 0530 07/03/18 0710  NA 137 135 136 136 138  K 4.3 3.8 3.9 3.4* 3.5  CL 104 104 108 106 106  CO2 25 24 23 24 24   GLUCOSE 295* 213* 183* 114* 132*  BUN 24* 17 14 13 12   CREATININE 0.91 0.79 0.64 0.65 0.73  CALCIUM 8.1* 7.7* 7.6* 7.8* 8.0*  MG  --   --   --   --  1.6*   GFR: Estimated Creatinine Clearance: 35.4 mL/min (by C-G formula based on SCr of 0.73 mg/dL). Liver Function Tests: Recent Labs  Lab 06/29/18 0030 06/29/18 0614  AST 16 13*  ALT 10 10  ALKPHOS 82 71  BILITOT 0.2* 0.5  PROT 7.0 6.1*  ALBUMIN 2.6* 2.1*   No results for input(s): LIPASE, AMYLASE in the last 168 hours. No results for input(s): AMMONIA in the last 168 hours. Coagulation Profile: No results for input(s): INR, PROTIME in the last 168 hours. Cardiac Enzymes: No results for input(s): CKTOTAL, CKMB, CKMBINDEX, TROPONINI in the last 168 hours. BNP (last 3 results) No results for input(s): PROBNP in the last 8760 hours. HbA1C: No results for input(s): HGBA1C in the last 72 hours. CBG: Recent Labs  Lab 07/04/18 0353 07/04/18 0741 07/04/18 1140 07/04/18 1621 07/04/18 2003  GLUCAP 92 117* 327* 256* 302*   Lipid Profile: No results for input(s): CHOL, HDL, LDLCALC, TRIG, CHOLHDL, LDLDIRECT in the last 72 hours. Thyroid Function Tests: No results for input(s): TSH, T4TOTAL, FREET4, T3FREE, THYROIDAB in the last 72 hours. Anemia Panel: No results for input(s): VITAMINB12, FOLATE, FERRITIN, TIBC, IRON, RETICCTPCT in the last 72 hours. Urine analysis:    Component Value Date/Time   COLORURINE YELLOW 06/29/2018 0330   APPEARANCEUR CLOUDY (A) 06/29/2018 0330   LABSPEC 1.014 06/29/2018 0330   PHURINE 5.0 06/29/2018 0330   GLUCOSEU >=500 (A) 06/29/2018 0330   HGBUR SMALL (A) 06/29/2018 0330   BILIRUBINUR NEGATIVE 06/29/2018 0330   KETONESUR NEGATIVE 06/29/2018 0330   PROTEINUR 100 (A)  06/29/2018 0330   UROBILINOGEN 0.2 06/30/2015 1317   NITRITE NEGATIVE 06/29/2018 0330   LEUKOCYTESUR LARGE (A) 06/29/2018 0330   Sepsis Labs: @LABRCNTIP (procalcitonin:4,lacticidven:4)  ) Recent Results (from the past 240 hour(s))  Blood Culture (routine x 2)     Status: None   Collection Time: 06/29/18 12:30 AM  Result Value Ref Range Status   Specimen Description BLOOD RIGHT HAND  Final   Special Requests   Final    BOTTLES DRAWN AEROBIC AND ANAEROBIC Blood Culture results may not be optimal due to an inadequate volume of blood received in culture bottles   Culture   Final  NO GROWTH 5 DAYS Performed at [facility], [address] [place], [place] [address]    Report Status 07/04/2018 FINAL  Final  Blood Culture (routine x 2)     Status: None   Collection Time: 06/29/18 12:45 AM  Result Value Ref Range Status   Specimen Description BLOOD RIGHT ARM  Final   Special Requests   Final    BOTTLES DRAWN AEROBIC AND ANAEROBIC Blood Culture results may not be optimal due to an inadequate volume of blood received in culture bottles   Culture   Final    NO GROWTH 5 DAYS Performed at [facility], [address] [place], [place] [address]    Report Status 07/04/2018 FINAL  Final  Culture, Urine     Status: None   Collection Time: 06/29/18  4:21 AM  Result Value Ref Range Status   Specimen Description   Final    URINE, RANDOM Performed at [facility], [address], [place], [place] [address]    Special Requests   Final    NONE Performed at [facility], [address] [place], [place] [address]    Culture   Final    NO GROWTH Performed at [facility], [place] [address] [place], [place] [address]    Report Status 06/30/2018 FINAL  Final  MRSA PCR Screening     Status: None   Collection Time: 06/29/18  4:37 AM  Result Value Ref Range Status   MRSA by PCR NEGATIVE NEGATIVE Final    Comment:        The GeneXpert MRSA Assay (FDA approved for NASAL specimens only), is  one component of a comprehensive MRSA colonization surveillance program. It is not intended to diagnose MRSA infection nor to guide or monitor treatment for MRSA infections. Performed at [facility], [address], [place], [place] [address]       Studies: No results found.  Scheduled Meds: . aspirin EC  81 mg Oral BID  . atorvastatin  10 mg Oral QPM  . cholecalciferol  2,000 Units Oral Daily  . diltiazem  120 mg Oral Daily  . enoxaparin (LOVENOX) injection  40 mg Subcutaneous Q24H  . ferrous [id number] C-folic acid  1 capsule Oral Daily  . insulin aspart  0-9 Units Subcutaneous Q4H  . levalbuterol  0.63 mg Nebulization BID  . mouth rinse  15 mL Mouth Rinse BID  . mometasone-formoterol  2 puff Inhalation BID  . pantoprazole  40 mg Oral Daily  . predniSONE  5 mg Oral Q breakfast    Continuous Infusions: . sodium chloride 10 mL/hr at 07/04/18 0135  . aztreonam 1 g (07/04/18 1450)     LOS: 5 days     Cristal Deer, MD [facility]  To reach me or the doctor on call, go to: [email or web address] Password [facility]  07/04/2018, 8:37 PM

## 2018-07-05 DIAGNOSIS — N390 Urinary tract infection, site not specified: Secondary | ICD-10-CM

## 2018-07-05 LAB — CBC WITH DIFFERENTIAL/PLATELET
Basophils Absolute: 0 10*3/uL (ref 0.0–0.1)
Basophils Relative: 0 %
EOS ABS: 0.3 10*3/uL (ref 0.0–0.7)
EOS PCT: 2 %
HCT: 32.4 % — ABNORMAL LOW (ref 36.0–46.0)
Hemoglobin: 10.2 g/dL — ABNORMAL LOW (ref 12.0–15.0)
LYMPHS ABS: 1.8 10*3/uL (ref 0.7–4.0)
Lymphocytes Relative: 11 %
MCH: 28.4 pg (ref 26.0–34.0)
MCHC: 31.5 g/dL (ref 30.0–36.0)
MCV: 90.3 fL (ref 78.0–100.0)
MONOS PCT: 4 %
Monocytes Absolute: 0.7 10*3/uL (ref 0.1–1.0)
Neutro Abs: 13 10*3/uL — ABNORMAL HIGH (ref 1.7–7.7)
Neutrophils Relative %: 83 %
PLATELETS: 384 10*3/uL (ref 150–400)
RBC: 3.59 MIL/uL — ABNORMAL LOW (ref 3.87–5.11)
RDW: 16.2 % — ABNORMAL HIGH (ref 11.5–15.5)
WBC: 15.7 10*3/uL — AB (ref 4.0–10.5)

## 2018-07-05 LAB — GLUCOSE, CAPILLARY
GLUCOSE-CAPILLARY: 89 mg/dL (ref 70–99)
Glucose-Capillary: 120 mg/dL — ABNORMAL HIGH (ref 70–99)
Glucose-Capillary: 266 mg/dL — ABNORMAL HIGH (ref 70–99)

## 2018-07-05 NOTE — Discharge Summary (Signed)
Physician Discharge Summary  [name] VFI:[id number] DOB: 17-May-1926 DOA: 06/29/2018  PCP: Moshe Cipro, MD  Admit date: 06/29/2018  Discharge date: 07/05/2018  Admitted From:Home  Disposition:  Home with home wound care  Recommendations for Outpatient Follow-up:  1. Follow up with PCP in 1-2 weeks; repeat CBC at that time to ensure leukocytosis resolved.  Home Health:None  Equipment/Devices:None  Discharge Condition:Stable  CODE STATUS: Full  Diet recommendation: Heart Healthy/Carb Modified  Brief/Interim Summary: [name]  is a 84 y.o. female, history of rheumatoid arthritis, breast cancer, CAD, diabetes mellitus type 2, hypertension, hyperlipidemia, who was brought to hospital due to elevated blood glucose at home.  She had multiple similar episodes in the recent past and she was noted to be septic with temperature 100.3 F, lactic acid elevation of 4.02, and tachycardia.  Her suspected source of infection was her urine which unfortunately, did not demonstrate any growth, nor did her blood cultures.  Chest x-ray did not show any signs of infiltrate.  She was empirically started on vancomycin, Azactam, and Flagyl.  This was then tapered down to Azactam only and she has now finished a 6-day course of treatment.  She has no further episodes of fevers over the last 72 hours and her leukocytosis has improved immensely.  Daughter at bedside states that patient is back to her usual baseline and has declined any home health care or hospice.  She may resume her home nitrofurantoin as scheduled on a usual basis as well as home wound care.  No further needs noted for hospitalization and inpatient care at this time.  No other acute events otherwise noted during her inpatient stay.  She will resume her usual home medications as prior.  Discharge Diagnoses:  Principal Problem:   Sepsis secondary to UTI [facility]) Active Problems:   Pressure ulcer   Sepsis ([place])   Goals of care,  counseling/discussion   Palliative care by specialist   DNR (do not resuscitate) discussion   Encounter for hospice care discussion   Abdominal pain   Acute lower UTI   Type 2 diabetes mellitus with hyperglycemia [facility])    Discharge Instructions  Discharge Instructions    Diet - low sodium heart healthy   Complete by:  As directed    Increase activity slowly   Complete by:  As directed      Allergies as of 07/05/2018      Reactions   Aspirin Other (See Comments)   Cannot take uncoated due to stomach ulcers   Megace [megestrol]    abd pain   Rocephin [ceftriaxone Sodium In Dextrose] Swelling      Medication List    TAKE these medications   acetaminophen 325 MG tablet Commonly known as:  TYLENOL Take 650 mg by mouth every 6 (six) hours as needed for mild pain or headache.   aspirin EC 81 MG tablet Take 81 mg by mouth 2 (two) times daily.   atorvastatin 10 MG tablet Commonly known as:  LIPITOR Take 10 mg by mouth every evening.   benzonatate 100 MG capsule Commonly known as:  TESSALON Take 1 capsule (100 mg total) by mouth 3 (three) times daily.   budesonide-formoterol 160-4.5 MCG/ACT inhaler Commonly known as:  SYMBICORT Inhale 2 puffs into the lungs 2 (two) times daily.   CENTRUM SILVER ADULT 50+ PO Take 1 tablet by mouth every morning.   CLEAR EYES ALL SEASONS 5-6 MG/ML Soln Generic drug:  Polyvinyl Alcohol-Povidone Apply 1 drop to eye every morning.  D-MANNOSE PO Take 500 mg by mouth daily.   diltiazem 120 MG 24 hr capsule Commonly known as:  CARDIZEM CD Take 120 mg by mouth daily.   guaiFENesin 600 MG 12 hr tablet Commonly known as:  MUCINEX Take 1 tablet (600 mg total) by mouth 2 (two) times daily. What changed:    when to take this  reasons to take this   insulin aspart 100 UNIT/ML FlexPen Commonly known as:  NOVOLOG Inject 4 Units into the skin 3 (three) times daily with meals.   Insulin Glargine 100 UNIT/ML Solostar Pen Commonly known  as:  LANTUS Inject 5 Units into the skin daily. What changed:  when to take this   ipratropium-albuterol 0.5-2.5 (3) MG/3ML Soln Commonly known as:  DUONEB Take 3 mLs by nebulization 2 (two) times daily.   lactobacillus acidophilus & bulgar chewable tablet CHEW 1 TABLET BY MOUTH EVERY DAY   magnesium oxide 400 (241.3 Mg) MG tablet Commonly known as:  MAG-OX Take 0.5 tablets (200 mg total) by mouth daily. What changed:    how much to take  additional instructions   metFORMIN 500 MG tablet Commonly known as:  GLUCOPHAGE Take 1,000 mg by mouth 2 (two) times daily with a meal.   metoprolol succinate 25 MG 24 hr tablet Commonly known as:  TOPROL-XL Take 0.5 tablets (12.5 mg total) by mouth daily. What changed:    how much to take  when to take this   nitrofurantoin 50 MG capsule Commonly known as:  MACRODANTIN Take 50 mg by mouth daily.   pantoprazole 40 MG tablet Commonly known as:  PROTONIX Take 40 mg by mouth daily.   POLY-IRON 150 FORTE 150-0.025-1 MG Caps Generic drug:  Iron Polysacch Cmplx-B12-FA TAKE ONE CAPSULE BY MOUTH DAILY   predniSONE 5 MG tablet Commonly known as:  DELTASONE Take 1 tablet (5 mg total) by mouth daily with breakfast.   raloxifene 60 MG tablet Commonly known as:  EVISTA Take 60 mg by mouth daily.   traMADol 50 MG tablet Commonly known as:  ULTRAM Take 50 mg by mouth every 6 (six) hours as needed for moderate pain.   Vitamin D3 2000 units capsule Take 2,000 Units by mouth daily.   zinc gluconate 50 MG tablet Take 50 mg by mouth daily.      Follow-up Information    Moshe Cipro, MD Follow up in 2 week(s).   Specialty:  Internal Medicine Contact information: [address] [place] [address] [phone]          Allergies  Allergen Reactions  . Aspirin Other (See Comments)    Cannot take uncoated due to stomach ulcers  . Megace [Megestrol]     abd pain   . Rocephin [Ceftriaxone Sodium In Dextrose]  Swelling    Consultations:  Palliative care   Procedures/Studies: Dg Abd 1 View  Result Date: 07/02/2018 CLINICAL DATA:  Generalized abdominal pain. EXAM: ABDOMEN - 1 VIEW COMPARISON:  Radiographs of Feb 11, 2018. FINDINGS: The bowel gas pattern is normal. Stool is noted throughout the colon. Atherosclerosis of abdominal aorta is noted. No radio-opaque calculi or other significant radiographic abnormality are seen. IMPRESSION: No evidence of bowel obstruction or ileus. Aortic Atherosclerosis (ICD10-I70.0). Electronically Signed   By: Marijo Conception, M.D.   On: 07/02/2018 15:16   Ct Head Wo Contrast  Result Date: 06/07/2018 CLINICAL DATA:  Altered mental status EXAM: CT HEAD WITHOUT CONTRAST TECHNIQUE: Contiguous axial images were obtained from the base of  the skull through the vertex without intravenous contrast. COMPARISON:  01/05/2018 head CT FINDINGS: Brain: No acute territorial infarction, hemorrhage, or intracranial mass. Moderate atrophy. Mild small vessel ischemic changes of the white matter. Stable ventricle size. Vascular: No hyperdense vessels.  Carotid vascular calcification Skull: Normal. Negative for fracture or focal lesion. Sinuses/Orbits: No acute finding. Other: None IMPRESSION: 1. No CT evidence for acute intracranial abnormality. 2. Atrophy and small vessel ischemic changes of the white matter Electronically Signed   By: Donavan Foil M.D.   On: 06/07/2018 19:31   Dg Chest Port 1 View  Result Date: 06/29/2018 CLINICAL DATA:  Sepsis EXAM: PORTABLE CHEST 1 VIEW COMPARISON:  06/07/2018 chest radiograph. FINDINGS: Left rotated chest radiograph. Left thorax obscured by the patient's jaw. Stable cardiomediastinal silhouette with normal heart size. No pneumothorax. No pleural effusion. No pulmonary edema. Stable elevation of the left hemidiaphragm with mild bibasilar atelectasis. IMPRESSION: Limited rotated chest radiograph. Chronic elevation of the left hemidiaphragm with mild  bibasilar scarring versus atelectasis. Electronically Signed   By: Ilona Sorrel M.D.   On: 06/29/2018 01:42   Dg Chest Port 1 View  Result Date: 06/07/2018 CLINICAL DATA:  Weakness and lethargy EXAM: PORTABLE CHEST 1 VIEW COMPARISON:  03/25/2018, 03/07/2018, 03/04/2017 FINDINGS: Study is limited by positioning. The right lung is grossly clear. Persistent elevation of left diaphragm with basilar opacity. Stable cardiomediastinal silhouette with aortic atherosclerosis. No pneumothorax. IMPRESSION: 1. No significant interval change in elevated left diaphragm with left basilar airspace disease. Electronically Signed   By: Donavan Foil M.D.   On: 06/07/2018 19:24     Discharge Exam: Vitals:   07/05/18 0933 07/05/18 0937  BP:    Pulse:    Resp:    Temp:    SpO2: 93% 97%   Vitals:   07/04/18 2339 07/05/18 0656 07/05/18 0933 07/05/18 0937  BP: (!) 116/53 (!) 127/52    Pulse: (!) 104 (!) 101    Resp: 15 14    Temp: 98.2 F (36.8 C) 97.8 F (36.6 C)    TempSrc: Oral Oral    SpO2: 100% 96% 93% 97%  Weight:      Height:        General: Somnolent Cardiovascular: RRR, S1/S2 +, no rubs, no gallops Respiratory: CTA bilaterally, no wheezing, no rhonchi Abdominal: Soft, NT, ND, bowel sounds + Extremities: no edema, no cyanosis    The results of significant diagnostics from this hospitalization (including imaging, microbiology, ancillary and laboratory) are listed below for reference.     Microbiology: Recent Results (from the past 240 hour(s))  Blood Culture (routine x 2)     Status: None   Collection Time: 06/29/18 12:30 AM  Result Value Ref Range Status   Specimen Description BLOOD RIGHT HAND  Final   Special Requests   Final    BOTTLES DRAWN AEROBIC AND ANAEROBIC Blood Culture results may not be optimal due to an inadequate volume of blood received in culture bottles   Culture   Final    NO GROWTH 5 DAYS Performed at [facility], [address] [place], [place] [address]     Report Status 07/04/2018 FINAL  Final  Blood Culture (routine x 2)     Status: None   Collection Time: 06/29/18 12:45 AM  Result Value Ref Range Status   Specimen Description BLOOD RIGHT ARM  Final   Special Requests   Final    BOTTLES DRAWN AEROBIC AND ANAEROBIC Blood Culture results may not be optimal due to  an inadequate volume of blood received in culture bottles   Culture   Final    NO GROWTH 5 DAYS Performed at [facility], [address], [place], [place] [address]    Report Status 07/04/2018 FINAL  Final  Culture, Urine     Status: None   Collection Time: 06/29/18  4:21 AM  Result Value Ref Range Status   Specimen Description   Final    URINE, RANDOM Performed at [facility], [address], [place], [place] [address]    Special Requests   Final    NONE Performed at [facility], [address], [place], [place] [address]    Culture   Final    NO GROWTH Performed at [facility], [place] [address], [place], [place] [address]    Report Status 06/30/2018 FINAL  Final  MRSA PCR Screening     Status: None   Collection Time: 06/29/18  4:37 AM  Result Value Ref Range Status   MRSA by PCR NEGATIVE NEGATIVE Final    Comment:        The GeneXpert MRSA Assay (FDA approved for NASAL specimens only), is one component of a comprehensive MRSA colonization surveillance program. It is not intended to diagnose MRSA infection nor to guide or monitor treatment for MRSA infections. Performed at [facility], [address] [place], [place] [address]      Labs: BNP (last 3 results) No results for input(s): BNP in the last 8760 hours. Basic Metabolic Panel: Recent Labs  Lab 06/29/18 0614 06/30/18 0534 07/01/18 1045 07/02/18 0530 07/03/18 0710  NA 137 135 136 136 138  K 4.3 3.8 3.9 3.4* 3.5  CL 104 104 108 106 106  CO2 25 24 23 24 24   GLUCOSE 295* 213* 183* 114* 132*  BUN 24* 17 14 13 12   CREATININE 0.91 0.79 0.64 0.65 0.73  CALCIUM 8.1* 7.7* 7.6* 7.8* 8.0*  MG  --    --   --   --  1.6*   Liver Function Tests: Recent Labs  Lab 06/29/18 0030 06/29/18 0614  AST 16 13*  ALT 10 10  ALKPHOS 82 71  BILITOT 0.2* 0.5  PROT 7.0 6.1*  ALBUMIN 2.6* 2.1*   No results for input(s): LIPASE, AMYLASE in the last 168 hours. No results for input(s): AMMONIA in the last 168 hours. CBC: Recent Labs  Lab 07/01/18 1045 07/02/18 0530 07/03/18 0710 07/04/18 0642 07/05/18 0527  WBC 20.3* 16.8* 16.3* 17.3* 15.7*  NEUTROABS 18.4* 14.5* 13.6* 14.6* 13.0*  HGB 10.7* 11.3* 12.1 10.8* 10.2*  HCT 33.1* 36.0 38.4 35.3* 32.4*  MCV 87.3 87.4 87.7 90.5 90.3  PLT 405* 418* 393 405* 384   Cardiac Enzymes: No results for input(s): CKTOTAL, CKMB, CKMBINDEX, TROPONINI in the last 168 hours. BNP: Invalid input(s): POCBNP CBG: Recent Labs  Lab 07/04/18 1621 07/04/18 2003 07/04/18 2338 07/05/18 0410 07/05/18 0743  GLUCAP 256* 302* 165* 89 120*   D-Dimer No results for input(s): DDIMER in the last 72 hours. Hgb A1c No results for input(s): HGBA1C in the last 72 hours. Lipid Profile No results for input(s): CHOL, HDL, LDLCALC, TRIG, CHOLHDL, LDLDIRECT in the last 72 hours. Thyroid function studies No results for input(s): TSH, T4TOTAL, T3FREE, THYROIDAB in the last 72 hours.  Invalid input(s): FREET3 Anemia work up No results for input(s): VITAMINB12, FOLATE, FERRITIN, TIBC, IRON, RETICCTPCT in the last 72 hours. Urinalysis    Component Value Date/Time   COLORURINE YELLOW 06/29/2018 0330  APPEARANCEUR CLOUDY (A) 06/29/2018 0330   LABSPEC 1.014 06/29/2018 0330   PHURINE 5.0 06/29/2018 0330   GLUCOSEU >=500 (A) 06/29/2018 0330   HGBUR SMALL (A) 06/29/2018 0330   BILIRUBINUR NEGATIVE 06/29/2018 0330   KETONESUR NEGATIVE 06/29/2018 0330   PROTEINUR 100 (A) 06/29/2018 0330   UROBILINOGEN 0.2 06/30/2015 1317   NITRITE NEGATIVE 06/29/2018 0330   LEUKOCYTESUR LARGE (A) 06/29/2018 0330   Sepsis Labs Invalid input(s): PROCALCITONIN,  WBC,   LACTICIDVEN Microbiology Recent Results (from the past 240 hour(s))  Blood Culture (routine x 2)     Status: None   Collection Time: 06/29/18 12:30 AM  Result Value Ref Range Status   Specimen Description BLOOD RIGHT HAND  Final   Special Requests   Final    BOTTLES DRAWN AEROBIC AND ANAEROBIC Blood Culture results may not be optimal due to an inadequate volume of blood received in culture bottles   Culture   Final    NO GROWTH 5 DAYS Performed at [facility], [address], [place], [place] [address]    Report Status 07/04/2018 FINAL  Final  Blood Culture (routine x 2)     Status: None   Collection Time: 06/29/18 12:45 AM  Result Value Ref Range Status   Specimen Description BLOOD RIGHT ARM  Final   Special Requests   Final    BOTTLES DRAWN AEROBIC AND ANAEROBIC Blood Culture results may not be optimal due to an inadequate volume of blood received in culture bottles   Culture   Final    NO GROWTH 5 DAYS Performed at [facility] [address], [place], [place] [address]    Report Status 07/04/2018 FINAL  Final  Culture, Urine     Status: None   Collection Time: 06/29/18  4:21 AM  Result Value Ref Range Status   Specimen Description   Final    URINE, RANDOM Performed at [facility], [address], [place], [place] [address]    Special Requests   Final    NONE Performed at [facility], [address] [place], [place] [address]    Culture   Final    NO GROWTH Performed at [facility], [place] [address], [place], [place] [address]    Report Status 06/30/2018 FINAL  Final  MRSA PCR Screening     Status: None   Collection Time: 06/29/18  4:37 AM  Result Value Ref Range Status   MRSA by PCR NEGATIVE NEGATIVE Final    Comment:        The GeneXpert MRSA Assay (FDA approved for NASAL specimens only), is one component of a comprehensive MRSA colonization surveillance program. It is not intended to diagnose MRSA infection nor to guide or monitor treatment for MRSA  infections. Performed at [facility], [address] [place],  [address]      Time coordinating discharge: 35 minutes  SIGNED:   Rodena Goldmann, DO [facility] 07/05/2018, 11:37 AM Pager [phone]  If 7PM-7AM, please contact night-coverage [email or web address] Password [email or web address]

## 2018-07-05 NOTE — Progress Notes (Signed)
Palliative: [name] is resting quietly in bed.  She appears chronically ill and frail, as usual.  She is somewhat sleepy this morning, but does wake when prompted.  Present today at bedside is daughter [name], and daughter [name].  We talked about discharge plan.  No questions at this time.  Family states they will transport Ms. [place] home via private automobile. We talked briefly about CODE STATUS, treat the treatable but allowing natural death.  Family states that they still need time to talk about and consider this.  All questions answered.  [name] states that she has the booklets that were left for her to review including "hard choices for loving people", and "gone from my sight". 74 minutes Quinn Axe, NP Palliative Medicine Team Team Phone # [phone]  Greater than 50% of this time was spent counseling and coordinating care related to the above assessment and plan.

## 2018-07-05 NOTE — Care Management Important Message (Signed)
Important Message  Patient Details  Name: [name] MRN: [id number] Date of Birth: [birth year]   Medicare Important Message Given:  Yes    Shelda Altes 07/05/2018, 11:45 AM

## 2018-07-05 NOTE — Care Management Note (Signed)
Case Management Note  Patient Details  Name: [name] MRN: [id number] Date of Birth: 1926-06-19  Subjective/Objective:  Admitted with sepsis secondary to UTI. Pt from home, has family and caregivers for 24/7 supervision. Pt has all necessary DME. Pt has had 3 ED visits and 4 admissions in past 6 months. Family not interested in [facility]. She has seen palliative medicine this admission.                  Action/Plan: DC home with resumption of previous care giving arrangements.   Expected Discharge Date:  07/05/18               Expected Discharge Plan:  Home/Self Care  In-House Referral:  NA  Discharge planning Services  CM Consult  Post Acute Care Choice:  NA Choice offered to:  NA  Status of Service:  Completed, signed off   Sherald Barge, RN 07/05/2018, 12:07 PM

## 2018-08-07 IMAGING — DX DG CHEST 2V
3 series · 3 of 3 positions shown · non-contrast
Comparison: Chest radiograph March 07, 2018 and February 12, 2016

CLINICAL DATA: Lethargy for 2 days. Low-grade fever and
leukocytosis. History of breast cancer.

EXAM:
CHEST - 2 VIEW

[chest lat]
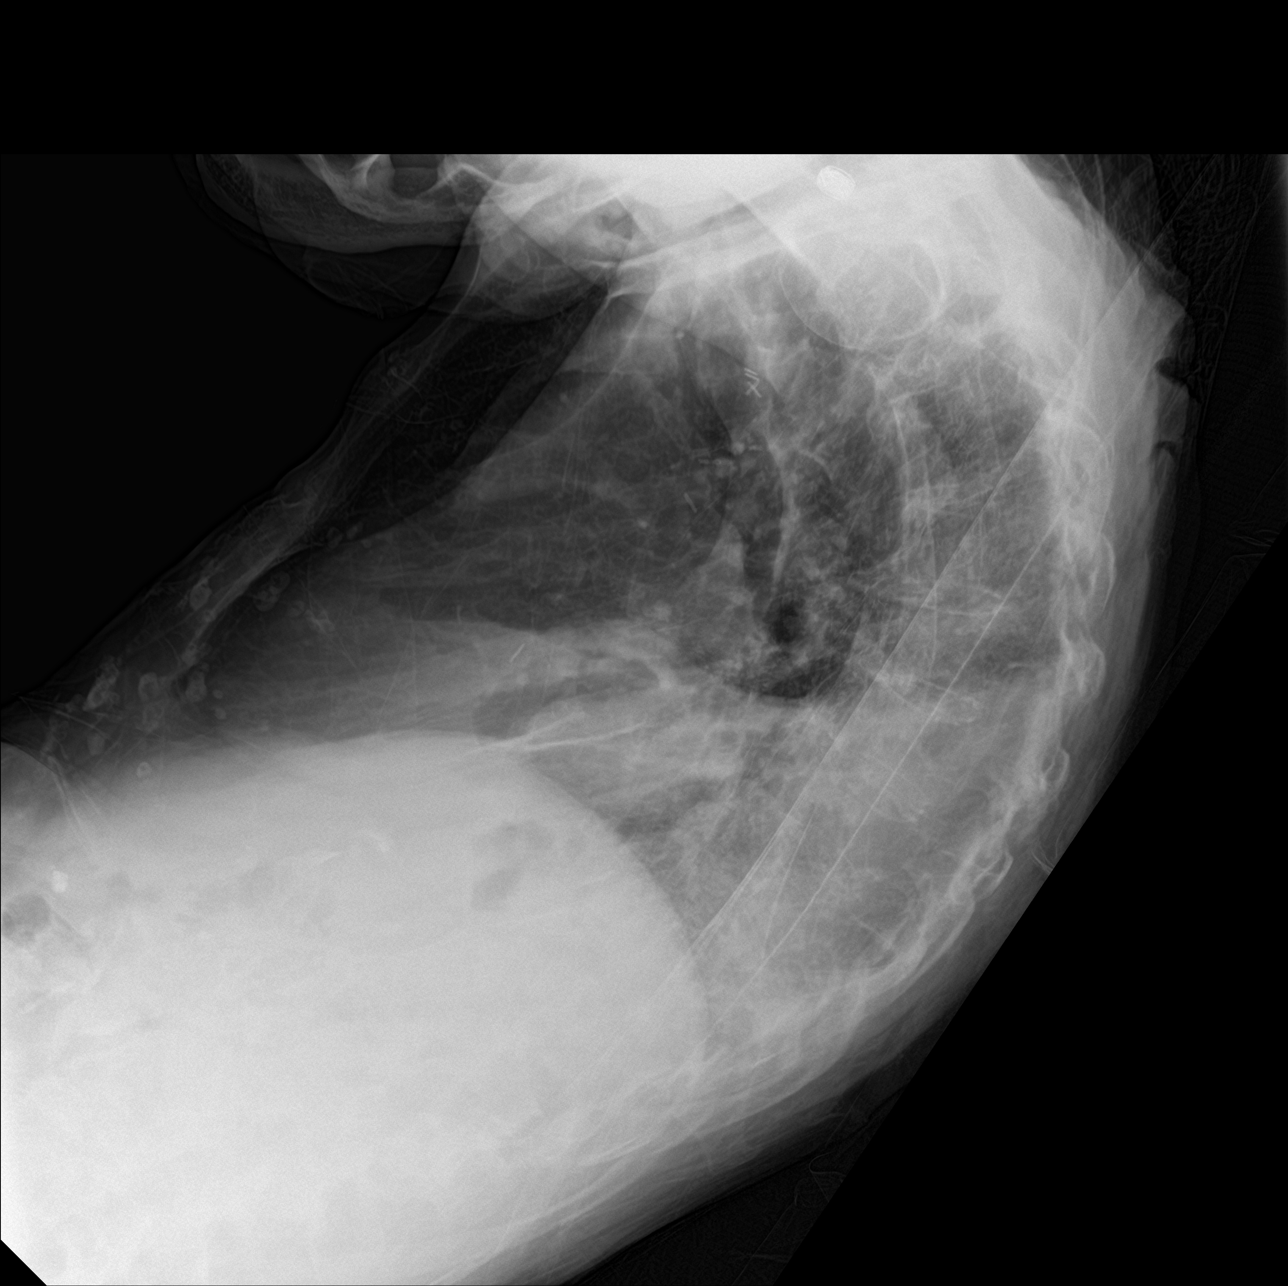

[chest ap (1 of 2)]
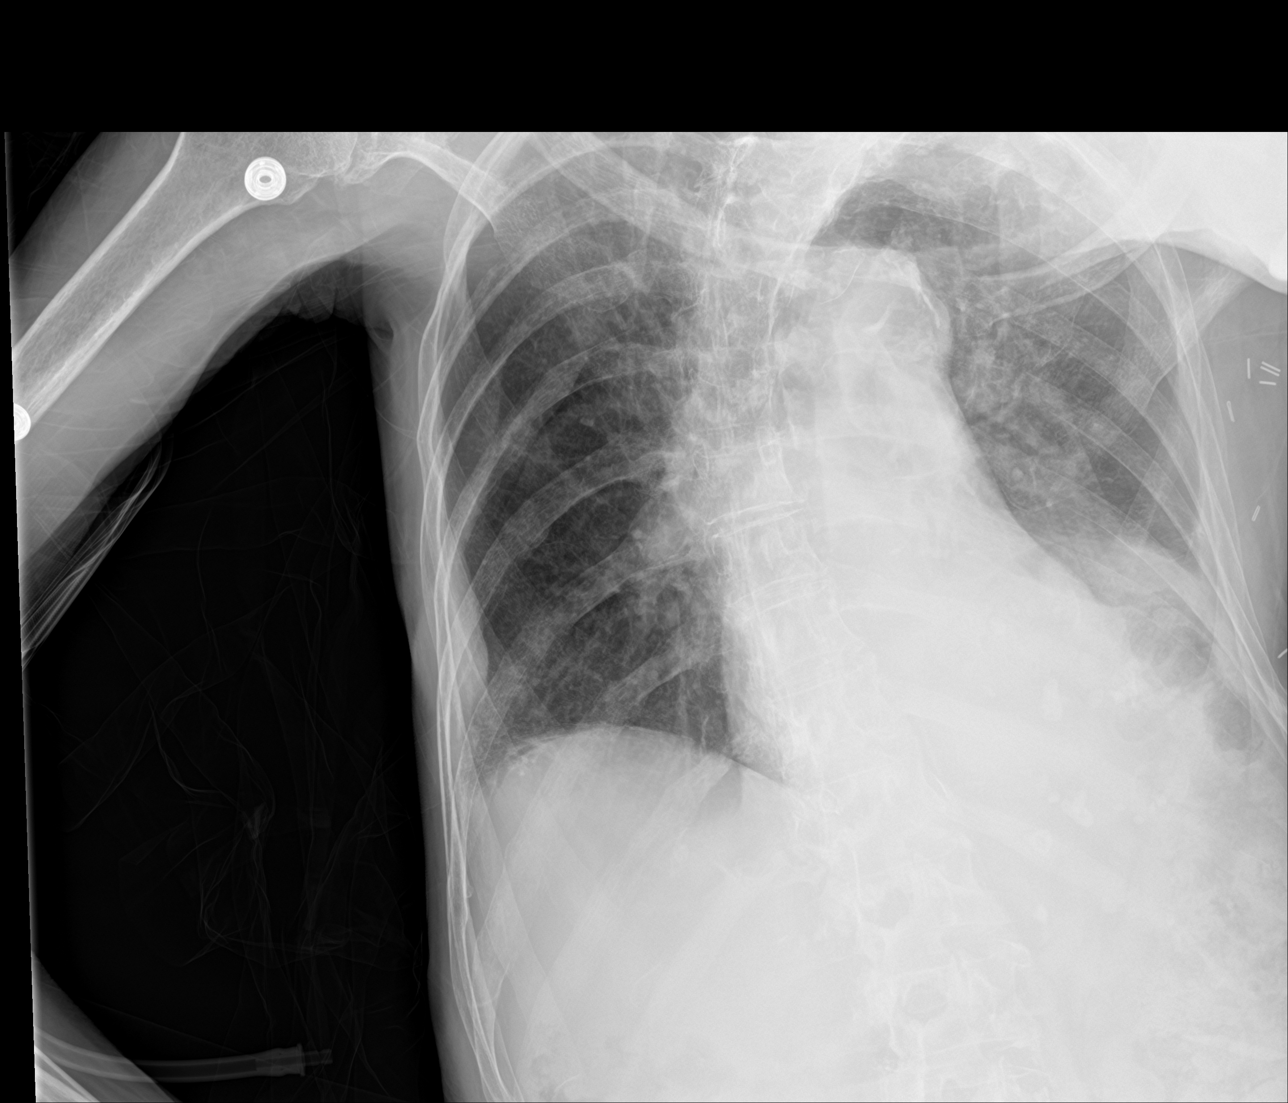

[chest ap (2 of 2)]
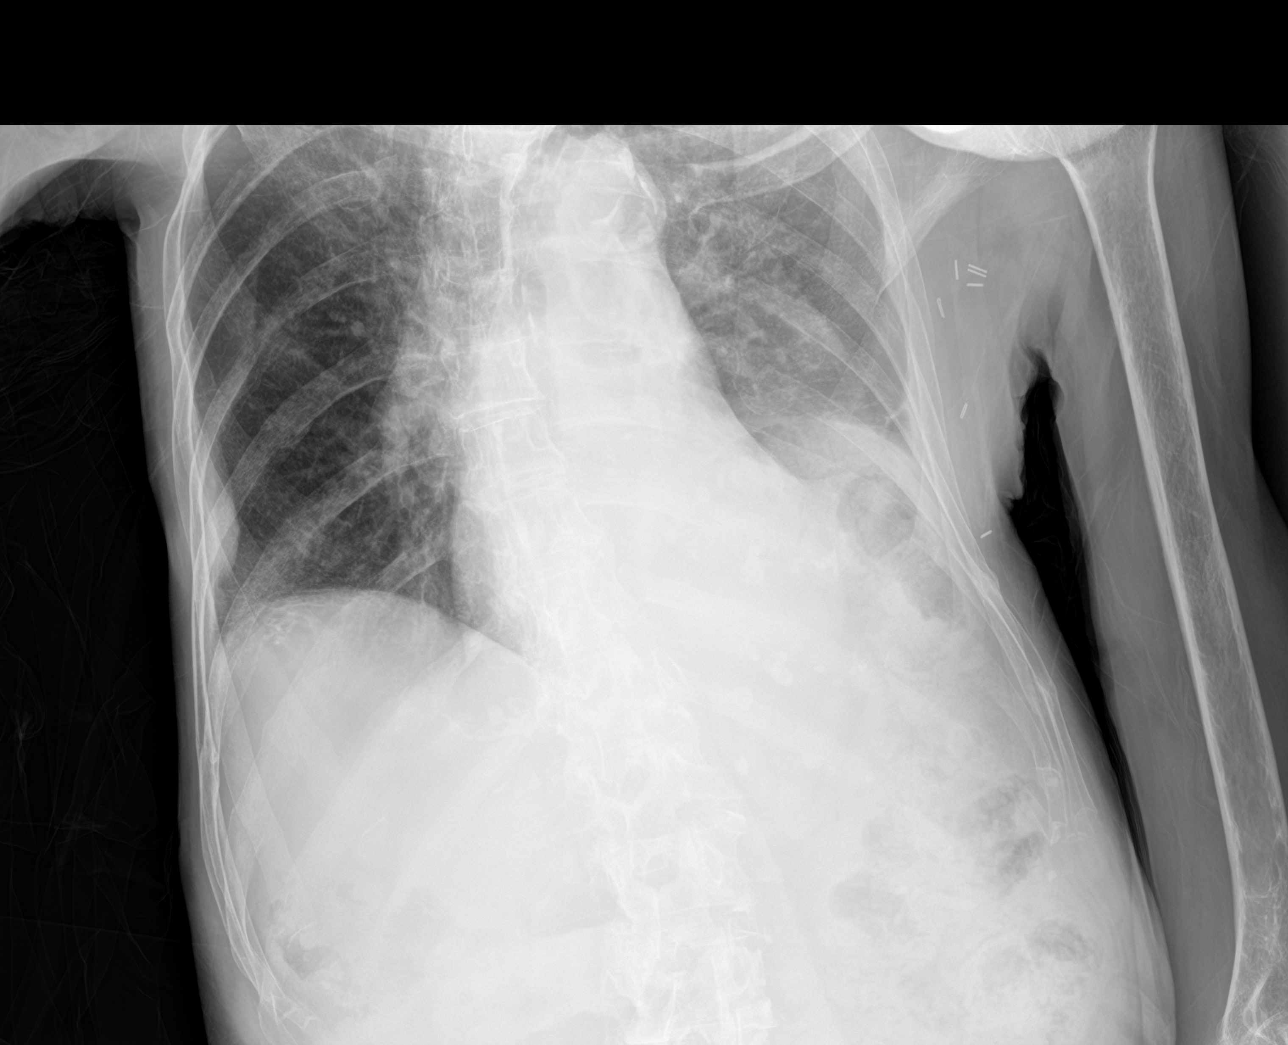

[3 of 3 positions shown; findings below may reference images not displayed]

FINDINGS: Patient's kyphotic and rotated to the LEFT. Cardiac silhouette is
mildly enlarged unchanged for calcified aortic knob. Chronic
interstitial changes. Persistently elevated LEFT hemidiaphragm with
LEFT greater than RIGHT bibasilar strandy densities. RIGHT lung
pleural thickening. No pneumothorax. Surgical clips LEFT chest wall.
Osteopenia.
IMPRESSION: Stable elevated LEFT hemidiaphragm with bibasilar
atelectasis/scarring.

Mild cardiomegaly.

Aortic Atherosclerosis ([id number]).

## 2018-09-05 DEATH — deceased

## 2019-02-24 ENCOUNTER — Other Ambulatory Visit (HOSPITAL_COMMUNITY): Payer: Medicare Other

## 2019-03-03 ENCOUNTER — Ambulatory Visit (HOSPITAL_COMMUNITY): Payer: Medicare Other | Admitting: Hematology
